# Patient Record
Sex: Female | Born: 1945
Health system: Southern US, Community
[De-identification: ages and names within clinical notes are randomized; demographics above are authoritative.]

## PROBLEM LIST (undated history)

## (undated) DIAGNOSIS — F329 Major depressive disorder, single episode, unspecified: Secondary | ICD-10-CM

## (undated) DIAGNOSIS — Z9889 Other specified postprocedural states: Secondary | ICD-10-CM

## (undated) DIAGNOSIS — Z8051 Family history of malignant neoplasm of kidney: Secondary | ICD-10-CM

## (undated) DIAGNOSIS — I4891 Unspecified atrial fibrillation: Secondary | ICD-10-CM

## (undated) DIAGNOSIS — C4491 Basal cell carcinoma of skin, unspecified: Secondary | ICD-10-CM

## (undated) DIAGNOSIS — C50919 Malignant neoplasm of unspecified site of unspecified female breast: Secondary | ICD-10-CM

## (undated) DIAGNOSIS — M545 Low back pain, unspecified: Secondary | ICD-10-CM

## (undated) DIAGNOSIS — Z8719 Personal history of other diseases of the digestive system: Secondary | ICD-10-CM

## (undated) DIAGNOSIS — I499 Cardiac arrhythmia, unspecified: Secondary | ICD-10-CM

## (undated) DIAGNOSIS — N6019 Diffuse cystic mastopathy of unspecified breast: Secondary | ICD-10-CM

## (undated) DIAGNOSIS — R0600 Dyspnea, unspecified: Secondary | ICD-10-CM

## (undated) DIAGNOSIS — F419 Anxiety disorder, unspecified: Secondary | ICD-10-CM

## (undated) DIAGNOSIS — K219 Gastro-esophageal reflux disease without esophagitis: Secondary | ICD-10-CM

## (undated) DIAGNOSIS — Z9289 Personal history of other medical treatment: Secondary | ICD-10-CM

## (undated) DIAGNOSIS — Z95810 Presence of automatic (implantable) cardiac defibrillator: Secondary | ICD-10-CM

## (undated) DIAGNOSIS — I472 Ventricular tachycardia, unspecified: Secondary | ICD-10-CM

## (undated) DIAGNOSIS — I10 Essential (primary) hypertension: Secondary | ICD-10-CM

## (undated) DIAGNOSIS — G43909 Migraine, unspecified, not intractable, without status migrainosus: Secondary | ICD-10-CM

## (undated) DIAGNOSIS — M1990 Unspecified osteoarthritis, unspecified site: Secondary | ICD-10-CM

## (undated) DIAGNOSIS — I341 Nonrheumatic mitral (valve) prolapse: Secondary | ICD-10-CM

## (undated) DIAGNOSIS — Z803 Family history of malignant neoplasm of breast: Secondary | ICD-10-CM

## (undated) DIAGNOSIS — E785 Hyperlipidemia, unspecified: Secondary | ICD-10-CM

## (undated) DIAGNOSIS — C4372 Malignant melanoma of left lower limb, including hip: Secondary | ICD-10-CM

## (undated) DIAGNOSIS — Z806 Family history of leukemia: Secondary | ICD-10-CM

## (undated) DIAGNOSIS — E039 Hypothyroidism, unspecified: Secondary | ICD-10-CM

## (undated) DIAGNOSIS — Z808 Family history of malignant neoplasm of other organs or systems: Secondary | ICD-10-CM

## (undated) DIAGNOSIS — M797 Fibromyalgia: Secondary | ICD-10-CM

## (undated) DIAGNOSIS — C73 Malignant neoplasm of thyroid gland: Secondary | ICD-10-CM

## (undated) DIAGNOSIS — R112 Nausea with vomiting, unspecified: Secondary | ICD-10-CM

## (undated) DIAGNOSIS — Z950 Presence of cardiac pacemaker: Secondary | ICD-10-CM

## (undated) DIAGNOSIS — R011 Cardiac murmur, unspecified: Secondary | ICD-10-CM

## (undated) DIAGNOSIS — F32A Depression, unspecified: Secondary | ICD-10-CM

## (undated) DIAGNOSIS — G8929 Other chronic pain: Secondary | ICD-10-CM

## (undated) HISTORY — PX: DILATION AND CURETTAGE OF UTERUS: SHX78

## (undated) HISTORY — PX: BACK SURGERY: SHX140

## (undated) HISTORY — PX: ELECTROPHYSIOLOGIC STUDY: SHX172A

## (undated) HISTORY — DX: Ventricular tachycardia, unspecified: I47.20

## (undated) HISTORY — DX: Depression, unspecified: F32.A

## (undated) HISTORY — DX: Family history of malignant neoplasm of kidney: Z80.51

## (undated) HISTORY — DX: Fibromyalgia: M79.7

## (undated) HISTORY — DX: Hyperlipidemia, unspecified: E78.5

## (undated) HISTORY — DX: Diffuse cystic mastopathy of unspecified breast: N60.19

## (undated) HISTORY — DX: Family history of malignant neoplasm of breast: Z80.3

## (undated) HISTORY — PX: COLONOSCOPY: SHX174

## (undated) HISTORY — DX: Basal cell carcinoma of skin, unspecified: C44.91

## (undated) HISTORY — PX: LAPAROSCOPIC CHOLECYSTECTOMY: SUR755

## (undated) HISTORY — DX: Malignant neoplasm of thyroid gland: C73

## (undated) HISTORY — DX: Major depressive disorder, single episode, unspecified: F32.9

## (undated) HISTORY — DX: Family history of leukemia: Z80.6

## (undated) HISTORY — DX: Ventricular tachycardia: I47.2

## (undated) HISTORY — DX: Family history of malignant neoplasm of other organs or systems: Z80.8

---

## 1990-10-26 DIAGNOSIS — Z9289 Personal history of other medical treatment: Secondary | ICD-10-CM

## 1990-10-26 HISTORY — PX: MASTECTOMY: SHX3

## 1990-10-26 HISTORY — PX: PLACEMENT OF BREAST IMPLANTS: SHX6334

## 1990-10-26 HISTORY — DX: Personal history of other medical treatment: Z92.89

## 1992-10-26 HISTORY — PX: HYSTEROTOMY: SHX1776

## 1996-10-26 HISTORY — PX: ABDOMINAL HYSTERECTOMY: SHX81

## 1998-11-21 ENCOUNTER — Ambulatory Visit (HOSPITAL_COMMUNITY): Admission: RE | Admit: 1998-11-21 | Discharge: 1998-11-21 | Payer: Self-pay | Admitting: Orthopedic Surgery

## 1998-11-21 ENCOUNTER — Encounter: Payer: Self-pay | Admitting: Orthopedic Surgery

## 1999-10-27 DIAGNOSIS — C4372 Malignant melanoma of left lower limb, including hip: Secondary | ICD-10-CM

## 1999-10-27 DIAGNOSIS — C4491 Basal cell carcinoma of skin, unspecified: Secondary | ICD-10-CM

## 1999-10-27 DIAGNOSIS — C73 Malignant neoplasm of thyroid gland: Secondary | ICD-10-CM

## 1999-10-27 HISTORY — DX: Malignant neoplasm of thyroid gland: C73

## 1999-10-27 HISTORY — DX: Malignant melanoma of left lower limb, including hip: C43.72

## 1999-10-27 HISTORY — PX: BREAST IMPLANT EXCHANGE: SHX6296

## 1999-10-27 HISTORY — PX: MELANOMA EXCISION: SHX5266

## 1999-10-27 HISTORY — PX: ANTERIOR CERVICAL DECOMP/DISCECTOMY FUSION: SHX1161

## 1999-10-27 HISTORY — DX: Basal cell carcinoma of skin, unspecified: C44.91

## 1999-10-27 HISTORY — PX: BASAL CELL CARCINOMA EXCISION: SHX1214

## 1999-12-25 HISTORY — PX: TOTAL THYROIDECTOMY: SHX2547

## 2000-01-13 ENCOUNTER — Encounter (INDEPENDENT_AMBULATORY_CARE_PROVIDER_SITE_OTHER): Payer: Self-pay | Admitting: Specialist

## 2000-01-13 ENCOUNTER — Ambulatory Visit (HOSPITAL_COMMUNITY): Admission: RE | Admit: 2000-01-13 | Discharge: 2000-01-14 | Payer: Self-pay

## 2000-05-25 ENCOUNTER — Encounter: Admission: RE | Admit: 2000-05-25 | Discharge: 2000-05-25 | Payer: Self-pay | Admitting: Neurosurgery

## 2000-05-25 ENCOUNTER — Encounter: Payer: Self-pay | Admitting: Neurosurgery

## 2000-06-16 ENCOUNTER — Encounter: Payer: Self-pay | Admitting: Neurosurgery

## 2000-06-16 ENCOUNTER — Inpatient Hospital Stay (HOSPITAL_COMMUNITY): Admission: RE | Admit: 2000-06-16 | Discharge: 2000-06-17 | Payer: Self-pay | Admitting: Neurosurgery

## 2000-07-19 ENCOUNTER — Encounter: Admission: RE | Admit: 2000-07-19 | Discharge: 2000-07-19 | Payer: Self-pay | Admitting: Neurosurgery

## 2000-07-19 ENCOUNTER — Encounter: Payer: Self-pay | Admitting: Neurosurgery

## 2000-08-11 ENCOUNTER — Inpatient Hospital Stay (HOSPITAL_COMMUNITY): Admission: AD | Admit: 2000-08-11 | Discharge: 2000-08-12 | Payer: Self-pay | Admitting: Internal Medicine

## 2000-08-19 ENCOUNTER — Encounter (INDEPENDENT_AMBULATORY_CARE_PROVIDER_SITE_OTHER): Payer: Self-pay | Admitting: Specialist

## 2000-08-19 ENCOUNTER — Other Ambulatory Visit: Admission: RE | Admit: 2000-08-19 | Discharge: 2000-08-19 | Payer: Self-pay | Admitting: *Deleted

## 2000-08-27 ENCOUNTER — Encounter: Payer: Self-pay | Admitting: Neurosurgery

## 2000-08-27 ENCOUNTER — Encounter: Admission: RE | Admit: 2000-08-27 | Discharge: 2000-08-27 | Payer: Self-pay | Admitting: Neurosurgery

## 2001-05-03 ENCOUNTER — Encounter: Payer: Self-pay | Admitting: Neurosurgery

## 2001-05-03 ENCOUNTER — Encounter: Admission: RE | Admit: 2001-05-03 | Discharge: 2001-05-03 | Payer: Self-pay | Admitting: Neurosurgery

## 2001-08-10 ENCOUNTER — Encounter: Payer: Self-pay | Admitting: Neurosurgery

## 2001-08-10 ENCOUNTER — Encounter: Admission: RE | Admit: 2001-08-10 | Discharge: 2001-08-10 | Payer: Self-pay | Admitting: Neurosurgery

## 2002-10-26 HISTORY — PX: LAPAROSCOPIC CHOLECYSTECTOMY: SUR755

## 2003-08-08 ENCOUNTER — Encounter: Payer: Self-pay | Admitting: Emergency Medicine

## 2003-08-08 ENCOUNTER — Emergency Department (HOSPITAL_COMMUNITY): Admission: EM | Admit: 2003-08-08 | Discharge: 2003-08-08 | Payer: Self-pay | Admitting: Emergency Medicine

## 2003-12-24 ENCOUNTER — Ambulatory Visit (HOSPITAL_COMMUNITY): Admission: RE | Admit: 2003-12-24 | Discharge: 2003-12-24 | Payer: Self-pay | Admitting: Gastroenterology

## 2004-02-19 ENCOUNTER — Encounter (INDEPENDENT_AMBULATORY_CARE_PROVIDER_SITE_OTHER): Payer: Self-pay | Admitting: *Deleted

## 2004-02-19 ENCOUNTER — Ambulatory Visit (HOSPITAL_COMMUNITY): Admission: RE | Admit: 2004-02-19 | Discharge: 2004-02-20 | Payer: Self-pay | Admitting: General Surgery

## 2004-10-26 HISTORY — PX: CARPAL TUNNEL RELEASE: SHX101

## 2005-08-26 ENCOUNTER — Ambulatory Visit (HOSPITAL_COMMUNITY): Admission: RE | Admit: 2005-08-26 | Discharge: 2005-08-26 | Payer: Self-pay | Admitting: Neurosurgery

## 2006-06-09 ENCOUNTER — Ambulatory Visit: Payer: Self-pay | Admitting: Internal Medicine

## 2007-07-22 ENCOUNTER — Ambulatory Visit: Payer: Self-pay | Admitting: Internal Medicine

## 2007-10-26 ENCOUNTER — Encounter: Payer: Self-pay | Admitting: Endocrinology

## 2008-05-03 ENCOUNTER — Encounter: Payer: Self-pay | Admitting: Endocrinology

## 2008-08-24 ENCOUNTER — Ambulatory Visit: Payer: Self-pay | Admitting: Internal Medicine

## 2009-01-21 ENCOUNTER — Encounter: Payer: Self-pay | Admitting: Endocrinology

## 2009-07-09 ENCOUNTER — Encounter: Payer: Self-pay | Admitting: Endocrinology

## 2009-08-16 ENCOUNTER — Encounter (INDEPENDENT_AMBULATORY_CARE_PROVIDER_SITE_OTHER): Payer: Self-pay | Admitting: *Deleted

## 2009-08-20 ENCOUNTER — Encounter: Admission: RE | Admit: 2009-08-20 | Discharge: 2009-10-03 | Payer: Self-pay | Admitting: Rheumatology

## 2009-08-28 ENCOUNTER — Encounter: Payer: Self-pay | Admitting: Internal Medicine

## 2009-08-29 DIAGNOSIS — I472 Ventricular tachycardia, unspecified: Secondary | ICD-10-CM | POA: Insufficient documentation

## 2009-08-29 DIAGNOSIS — E785 Hyperlipidemia, unspecified: Secondary | ICD-10-CM | POA: Insufficient documentation

## 2009-09-02 ENCOUNTER — Ambulatory Visit: Payer: Self-pay | Admitting: Internal Medicine

## 2009-09-13 ENCOUNTER — Ambulatory Visit (HOSPITAL_COMMUNITY): Admission: RE | Admit: 2009-09-13 | Discharge: 2009-09-13 | Payer: Self-pay | Admitting: Rheumatology

## 2009-10-03 ENCOUNTER — Encounter: Admission: RE | Admit: 2009-10-03 | Discharge: 2009-10-03 | Payer: Self-pay | Admitting: Rheumatology

## 2009-10-03 ENCOUNTER — Ambulatory Visit: Payer: Self-pay | Admitting: Internal Medicine

## 2009-10-03 ENCOUNTER — Encounter: Payer: Self-pay | Admitting: Internal Medicine

## 2009-10-15 ENCOUNTER — Ambulatory Visit: Payer: Self-pay | Admitting: Internal Medicine

## 2009-10-15 ENCOUNTER — Ambulatory Visit (HOSPITAL_COMMUNITY): Admission: RE | Admit: 2009-10-15 | Discharge: 2009-10-15 | Payer: Self-pay | Admitting: Internal Medicine

## 2009-10-26 HISTORY — PX: VENTRICULAR ABLATION SURGERY: SHX835

## 2009-10-31 ENCOUNTER — Encounter: Payer: Self-pay | Admitting: Endocrinology

## 2009-12-02 ENCOUNTER — Ambulatory Visit: Payer: Self-pay | Admitting: Endocrinology

## 2009-12-02 DIAGNOSIS — C44309 Unspecified malignant neoplasm of skin of other parts of face: Secondary | ICD-10-CM | POA: Insufficient documentation

## 2009-12-02 DIAGNOSIS — C44300 Unspecified malignant neoplasm of skin of unspecified part of face: Secondary | ICD-10-CM | POA: Insufficient documentation

## 2009-12-02 DIAGNOSIS — F32A Depression, unspecified: Secondary | ICD-10-CM | POA: Insufficient documentation

## 2009-12-02 DIAGNOSIS — C73 Malignant neoplasm of thyroid gland: Secondary | ICD-10-CM

## 2009-12-02 DIAGNOSIS — F329 Major depressive disorder, single episode, unspecified: Secondary | ICD-10-CM

## 2009-12-02 DIAGNOSIS — N6019 Diffuse cystic mastopathy of unspecified breast: Secondary | ICD-10-CM

## 2009-12-02 DIAGNOSIS — E890 Postprocedural hypothyroidism: Secondary | ICD-10-CM

## 2009-12-02 DIAGNOSIS — IMO0001 Reserved for inherently not codable concepts without codable children: Secondary | ICD-10-CM | POA: Insufficient documentation

## 2009-12-02 DIAGNOSIS — C4370 Malignant melanoma of unspecified lower limb, including hip: Secondary | ICD-10-CM

## 2009-12-02 LAB — CONVERTED CEMR LAB
TSH: 0.66 microintl units/mL (ref 0.35–5.50)
Thyroglobulin Ab: <30 (ref 0.0–60.0)

## 2009-12-07 ENCOUNTER — Emergency Department (HOSPITAL_COMMUNITY): Admission: EM | Admit: 2009-12-07 | Discharge: 2009-12-07 | Payer: Self-pay | Admitting: Family Medicine

## 2009-12-09 ENCOUNTER — Ambulatory Visit: Payer: Self-pay | Admitting: Internal Medicine

## 2009-12-20 ENCOUNTER — Encounter: Payer: Self-pay | Admitting: Internal Medicine

## 2010-01-02 ENCOUNTER — Ambulatory Visit (HOSPITAL_COMMUNITY): Admission: RE | Admit: 2010-01-02 | Discharge: 2010-01-02 | Payer: Self-pay | Admitting: Internal Medicine

## 2010-01-02 ENCOUNTER — Ambulatory Visit: Payer: Self-pay | Admitting: Cardiovascular Disease

## 2010-01-02 ENCOUNTER — Ambulatory Visit: Payer: Self-pay

## 2010-01-02 ENCOUNTER — Encounter: Payer: Self-pay | Admitting: Internal Medicine

## 2010-01-09 ENCOUNTER — Telehealth: Payer: Self-pay | Admitting: Internal Medicine

## 2010-01-14 ENCOUNTER — Ambulatory Visit: Payer: Self-pay | Admitting: Internal Medicine

## 2010-01-14 LAB — CONVERTED CEMR LAB
BUN: 13 mg/dL (ref 6–23)
Basophils Absolute: 0.1 10*3/uL (ref 0.0–0.1)
Basophils Relative: 0.9 % (ref 0.0–3.0)
CO2: 33 meq/L — ABNORMAL HIGH (ref 19–32)
Calcium: 9.2 mg/dL (ref 8.4–10.5)
Chloride: 104 meq/L (ref 96–112)
Creatinine, Ser: 0.8 mg/dL (ref 0.4–1.2)
Eosinophils Absolute: 0.3 10*3/uL (ref 0.0–0.7)
Eosinophils Relative: 5.5 % — ABNORMAL HIGH (ref 0.0–5.0)
GFR calc non Af Amer: 77.15 mL/min (ref >60)
Glucose, Bld: 100 mg/dL — ABNORMAL HIGH (ref 70–99)
HCT: 35.9 % — ABNORMAL LOW (ref 36.0–46.0)
Hemoglobin: 12.3 g/dL (ref 12.0–15.0)
INR: 1 (ref 0.8–1.0)
Lymphocytes Relative: 33.8 % (ref 12.0–46.0)
Lymphs Abs: 2 10*3/uL (ref 0.7–4.0)
MCHC: 34.1 g/dL (ref 30.0–36.0)
MCV: 96 fL (ref 78.0–100.0)
Monocytes Absolute: 0.4 10*3/uL (ref 0.1–1.0)
Monocytes Relative: 6.5 % (ref 3.0–12.0)
Neutro Abs: 3.1 10*3/uL (ref 1.4–7.7)
Neutrophils Relative %: 53.3 % (ref 43.0–77.0)
Platelets: 199 10*3/uL (ref 150.0–400.0)
Potassium: 3.9 meq/L (ref 3.5–5.1)
Prothrombin Time: 10.5 s (ref 9.1–11.7)
RBC: 3.74 M/uL — ABNORMAL LOW (ref 3.87–5.11)
RDW: 13.1 % (ref 11.5–14.6)
Sodium: 140 meq/L (ref 135–145)
WBC: 5.9 10*3/uL (ref 4.5–10.5)
aPTT: 25.4 s (ref 21.7–28.8)

## 2010-01-21 ENCOUNTER — Ambulatory Visit: Payer: Self-pay | Admitting: Internal Medicine

## 2010-01-21 ENCOUNTER — Inpatient Hospital Stay (HOSPITAL_COMMUNITY): Admission: RE | Admit: 2010-01-21 | Discharge: 2010-01-23 | Payer: Self-pay | Admitting: Internal Medicine

## 2010-01-22 ENCOUNTER — Encounter: Payer: Self-pay | Admitting: Internal Medicine

## 2010-01-24 ENCOUNTER — Telehealth: Payer: Self-pay | Admitting: Internal Medicine

## 2010-02-17 ENCOUNTER — Telehealth (INDEPENDENT_AMBULATORY_CARE_PROVIDER_SITE_OTHER): Payer: Self-pay

## 2010-02-18 ENCOUNTER — Ambulatory Visit: Payer: Self-pay | Admitting: Internal Medicine

## 2010-02-18 ENCOUNTER — Ambulatory Visit: Payer: Self-pay

## 2010-03-25 ENCOUNTER — Encounter: Payer: Self-pay | Admitting: Endocrinology

## 2010-03-31 ENCOUNTER — Encounter: Admission: RE | Admit: 2010-03-31 | Discharge: 2010-03-31 | Payer: Self-pay | Admitting: Rheumatology

## 2010-05-21 ENCOUNTER — Ambulatory Visit: Payer: Self-pay | Admitting: Endocrinology

## 2010-05-22 LAB — CONVERTED CEMR LAB: TSH: 0.36 microintl units/mL (ref 0.35–5.50)

## 2010-05-30 ENCOUNTER — Telehealth: Payer: Self-pay | Admitting: Endocrinology

## 2010-05-30 ENCOUNTER — Telehealth: Payer: Self-pay | Admitting: Internal Medicine

## 2010-06-16 ENCOUNTER — Telehealth: Payer: Self-pay | Admitting: Endocrinology

## 2010-10-13 ENCOUNTER — Encounter: Payer: Self-pay | Admitting: Internal Medicine

## 2010-11-25 NOTE — Progress Notes (Signed)
Summary: test result   Phone Note Call from Patient Call back at Home Phone [phone] Call back at [phone]   Caller: Patient Reason for Call: Talk to Nurse, Lab or Test Results Details for Reason: echo Initial call taken by: Lorne Skeens,  January 09, 2010 9:56 AM  Follow-up for Phone Call        called pt and lmom with echo results  Dennis Bast, RN, BSN  January 09, 2010 1:02 PM

## 2010-11-25 NOTE — Letter (Signed)
Summary: [facility]  [facility]   Imported By: Lester [place] 12/06/2009 07:46:21  _____________________________________________________________________  External Attachment:    Type:   Image     Comment:   External Document

## 2010-11-25 NOTE — Letter (Signed)
Summary: [facility] Electrophysiology Questionaire for Female Pts  [facility] Electrophysiology Questionaire for Female Pts   Imported By: Roderic Ovens 12/26/2009 11:50:57  _____________________________________________________________________  External Attachment:    Type:   Image     Comment:   External Document

## 2010-11-25 NOTE — Progress Notes (Signed)
Summary: pt wants to talk about episode she had last night   Phone Note Call from Patient Call back at [phone]   Caller: Patient Reason for Call: Talk to Nurse, Talk to Doctor Summary of Call: pt had an episode where she felt like she had low blood sugar and her heart rate was very irregular pt felt hot inside and very shaky on the inside it happen around 9pm last night and she wants to make sure it's not her meds doing this to her she will be at number listed above until 5pm Initial call taken by: Omer Jack,  May 30, 2010 10:19 AM  Follow-up for Phone Call        had the feeling of low blood sugar around 9pm.  She felt her pulse and it was irregular.  She ate and things got better after about .  Feels this is all related to [facility] and will call us back and let us know Dennis Bast, RN, BSN  May 30, 2010 12:14 PM

## 2010-11-25 NOTE — Progress Notes (Signed)
Summary: Treadmill Pre-Procedure  Phone Note Outgoing Call Call back at Home Phone [phone]   Call placed by: Irean Hong, RN,  February 17, 2010 2:39 PM Summary of Call: Reviewed instructions for treadmill tomorrow on medications (flecainide).  Spoke with patient's husband. Nakai Yard,RN.

## 2010-11-25 NOTE — Progress Notes (Signed)
Summary: stress test   Phone Note Call from Patient Call back at Home Phone [phone]   Caller: Patient Summary of Call: Stress test is suppose to be sch for post hospital,  off work the 11 & 12, req call back Initial call taken by: Migdalia Dk,  January 24, 2010 11:34 AM  Follow-up for Phone Call        discussed with Dr Ladona Ridgel  will do GXT on 02/18/10  will talk with Cordelia Pen in [facility] on Mon and call pt back to schedule.  Pt aware Dennis Bast, RN, BSN  January 24, 2010 12:10 PM

## 2010-11-25 NOTE — Progress Notes (Signed)
Summary: Pt?  Phone Note Call from Patient Call back at Home Phone [phone]   Caller: Patient x [phone] Summary of Call: Pt called stating that through her own research she thinks that taking Cytomel along with Levoxyl may be an option for her. Pt is requesting MD's opinion on this, does she need OV to discuss or is treatment unadvisable? Initial call taken by: Margaret Pyle, CMA,  June 16, 2010 1:20 PM  Follow-up for Phone Call        please avoid this combination, as it is not good for you Follow-up by: Minus Breeding MD,  June 16, 2010 2:34 PM  Additional Follow-up for Phone Call Additional follow up Details #1::        Pt informed Additional Follow-up by: Margaret Pyle, CMA,  June 16, 2010 2:39 PM

## 2010-11-25 NOTE — Letter (Signed)
Summary: [employer] Authorization  [employer] Authorization   Imported By: Roderic Ovens 02/26/2010 15:51:32  _____________________________________________________________________  External Attachment:    Type:   Image     Comment:   External Document

## 2010-11-25 NOTE — Miscellaneous (Signed)
Summary: post hospital gxt  Clinical Lists Changes  Problems: Added new problem of ATRIAL FIBRILLATION (ICD-427.31) Orders: Added new Referral order of Treadmill (Treadmill) - Signed

## 2010-11-25 NOTE — Progress Notes (Signed)
Summary: Low CBG  Phone Note Call from Patient   Caller: Patient x [phone] Summary of Call: Pt called stating that at 9p last night she had an episode of low CBG causing increased hunger, hot flashes, shakiness and irregular heartbeat. Pt says she ate and the symtoms subsided. Pt is concerned about irregular heartbeat and wants to know if it could have been caused by low blood sugar. Pt does not regularly cheach CBGs, stating that she usually does not have any problems with managing her sugars. Please advise Initial call taken by: Margaret Pyle, CMA,  May 30, 2010 2:45 PM  Follow-up for Phone Call        given your recently normal thyroid blood test, you should conclude sxs are not thyroid-related.  please see pcp for sxs. Follow-up by: Minus Breeding MD,  May 30, 2010 2:56 PM  Additional Follow-up for Phone Call Additional follow up Details #1::        Pt informed and will contact PCP Additional Follow-up by: Margaret Pyle, CMA,  May 30, 2010 3:30 PM

## 2010-11-25 NOTE — Assessment & Plan Note (Signed)
Summary: NEW [facility] PT--PRIM CARE/DR DOUGLAS SCHULTZ-HX OF THY CA-PKG   Vital Signs:  Patient profile:   65 year old female Height:      65 inches (165.10 cm) Weight:      170.50 pounds (77.50 kg) O2 Sat:      96 % on Room air Temp:     97.0 degrees F (36.11 degrees C) oral Pulse rate:   63 / minute BP sitting:   120 / 78  (left arm) Cuff size:   regular  Vitals Entered By: Josph Macho CMA (December 02, 2009 8:18 AM)  O2 Flow:  Room air CC: New Endo: History of thyroid cancer/ CF   Referring Provider:  brodie Primary Provider:  Gaye Alken.[facility]  CC:  New Endo: History of thyroid cancer/ CF.  History of Present Illness: in 2001, pt had thyroidectomy for 9 mm right lobe papillary adenocarcinoma.  she has not had any adjuvant therapy or f/u imaging for this.  she been followed with serial thyroglobulins, which she says have been higher with increased tsh values, even within the normal range.  she takes levothyroxine 150 micrograms/day.   symptomatically, pt states 2 years of moderate (20-lb) weight gain, and intermittent associated palpitations in the chest.    Current Medications (verified): 1)  Sotalol Hcl 160 Mg Tabs (Sotalol Hcl) .Marland Kitchen.. 1 Tab Two Times A Day 2)  Cymbalta 60 Mg Cpep (Duloxetine Hcl) .Marland Kitchen.. 1 Tab Once Daily 3)  Levoxyl 150 Mcg Tabs (Levothyroxine Sodium) .Marland Kitchen.. 1 Tab Once Daily 4)  Aspirin 81 Mg Tbec (Aspirin) .... Take One Tablet By Mouth Daily 5)  Niacin Cr 500 Mg Cr-Tabs (Niacin) .... 2 Tabs At Bedtime 6)  Selenium 50 Mcg Tabs (Selenium) .... 2 Tabs Once Daily 7)  B-100 Complex  Tabs (Vitamins-Lipotropics) .Marland Kitchen.. 1 Ta B Once Daily 8)  Vitamin C 500 Mg Tabs (Ascorbic Acid) .Marland Kitchen.. 1 Tab Once Daily 9)  Vitamin D 1000 Unit Tabs (Cholecalciferol) .Marland Kitchen.. 1 Tab Once Daily 10)  Flax Seed Oil 1000 Mg Caps (Flaxseed (Linseed)) .Marland Kitchen.. 1 Cap Once Daily 11)  Calcium 500 Mg Tabs (Calcium Carbonate) .Marland Kitchen.. 1 Tab Three Times A Day 12)  Magnesium 250 Mg Tabs (Magnesium)  .... 3 Tabs Two Times A Day 13)  Manganese Gluconate 50 Mg Tabs (Manganese Gluconate) .Marland Kitchen.. 1 Tab Once Daily 14)  Coq-10 50 Mg Caps (Coenzyme Q10) .... Starting Today 15)  Aleve 220 Mg Tabs (Naproxen Sodium) .... 2 Tabs Once Daily 16)  Vitamin A 8000 Iu .Marland Kitchen.. 1 Tab Once Daily 17)  Premarin 0.3 Mg Tabs (Estrogens Conjugated) .... Daily  Allergies (verified): 1)  ! * Amoxicillin 2)  ! * Tolectin 3)  ! * Corgard 4)  ! * Tenormin 5)  ! * Lipitor 6)  ! * Quinidine 7)  ! * Mycin  Family History: grandmother: breast cancer brother has uncertain type of cancer no thyroid cancer  Social History: Reviewed history from 08/29/2009 and no changes required. Tobacco Use - No.  Alcohol Use - no Drug Use - no married works Adult nurse gi  Review of Systems       denies headache, hoarseness, visual loss, sob, diarrhea, numbness, tremor, and anxiety.  she reports chronic myalgias, fatigue, polyuria, muscle cramps, excessive diaphoresis, menopausal sxs, easy bruising, rhinorrhea, and insomnia.     Physical Exam  Additional Exam:  FastTSH                   0.66 uIU/mL  Thyroglobulin  2.1 ng/mL                   0.0-55.0 Thyroglobulin Antibody     <30.0 U/mL    Impression & Recommendations:  Problem # 1:  CARCINOMA, THYROID GLAND, PAPILLARY (ICD-193) no evidence of recurrence  Problem # 2:  POSTSURGICAL HYPOTHYROIDISM (ICD-244.0) given the long disease-free interval and the small tumor size, a normal tsh is appropriate.  Problem # 3:  weight gain and other symptoms, not thyroid-related  Medications Added to Medication List This Visit: 1)  Premarin 0.3 Mg Tabs (Estrogens conjugated) .... Daily  Other Orders: Tdap => 19yrs IM [phone]) Admin 1st Vaccine (65784) T-Thyroglobulin Panel [phone], [phone]) TLB-TSH (Thyroid Stimulating Hormone) [phone]) Consultation Level IV (64403)  Patient Instructions: 1)  tests are being ordered for you today.  a few days after the  test(s), please call [phone] to hear your test results. 2)  pending the test results, please continue the same medications for now 3)  return 1 year   Immunizations Administered:  Tetanus Vaccine:    Vaccine Type: Tdap    Site: left deltoid    Mfr: Gl[employer]oSmithK[employer]    Dose: 0.5 ml    Route: IM    Given by: Josph Macho CMA    Exp. Date: 12/21/2011    Lot #: [id number]    VIS given: 09/13/07 version given December 02, 2009.

## 2010-11-25 NOTE — Assessment & Plan Note (Signed)
Summary: rov/ gd  Medications Added SELENIUM 50 MCG TABS (SELENIUM) 1 tab once daily CALCIUM 500 MG TABS (CALCIUM CARBONATE) two times a day COQ10 100 MG CAPS (COENZYME Q10) once daily ALEVE 220 MG TABS (NAPROXEN SODIUM) 2 tabs as needed        Visit Type:  Follow-up Referring Provider:  brodie Primary Provider:  Gaye Alken.[facility]   History of Present Illness: [name] returns today for followup.  She is a very pleasant 65 year old woman with a history of VT who has been nicely controlled for many years on fairly high-dose sotalol.  She returns today for followup.  She has very rare palpitations.  No symptomatic bradycardia on sotalol.  She has fibro-myalgia.  Today the patient is concerned about the sotalol that she has been taking in high dose for a long time and requests that she come off of this drug.  She wants to proceed with ablation.  She has not had any sustained VT on sotalol but feels tired, fatigue and weakness. No syncope.  Problems Prior to Update: 1)  Depression  (ICD-311) 2)  Fibromyalgia  (ICD-729.1) 3)  Fibrocystic Breast Disease  (ICD-610.1) 4)  Postsurgical Hypothyroidism  (ICD-244.0) 5)  Carcinoma, Thyroid Gland, Papillary  (ICD-193) 6)  Carcinoma, Basal Cell, Face  (ICD-173.3) 7)  Melanoma, Leg, Left  (ICD-172.7) 8)  Ventricular Tachycardia  (ICD-427.1) 9)  Dyslipidemia  (ICD-272.4) 10)  Special Screening For Malignant Neoplasms Colon  (ICD-V76.51)  Current Medications (verified): 1)  Sotalol Hcl 160 Mg Tabs (Sotalol Hcl) .Marland Kitchen.. 1 Tab Two Times A Day 2)  Cymbalta 60 Mg Cpep (Duloxetine Hcl) .Marland Kitchen.. 1 Tab Once Daily 3)  Levoxyl 150 Mcg Tabs (Levothyroxine Sodium) .Marland Kitchen.. 1 Tab Once Daily 4)  Aspirin 81 Mg Tbec (Aspirin) .... Take One Tablet By Mouth Daily 5)  Niacin Cr 500 Mg Cr-Tabs (Niacin) .... 2 Tabs At Bedtime 6)  Selenium 50 Mcg Tabs (Selenium) .Marland Kitchen.. 1 Tab Once Daily 7)  B-100 Complex  Tabs (Vitamins-Lipotropics) .Marland Kitchen.. 1 Ta B Once Daily 8)   Vitamin C 500 Mg Tabs (Ascorbic Acid) .Marland Kitchen.. 1 Tab Once Daily 9)  Vitamin D 1000 Unit Tabs (Cholecalciferol) .Marland Kitchen.. 1 Tab Once Daily 10)  Flax Seed Oil 1000 Mg Caps (Flaxseed (Linseed)) .Marland Kitchen.. 1 Cap Once Daily 11)  Calcium 500 Mg Tabs (Calcium Carbonate) .... Two Times A Day 12)  Magnesium 250 Mg Tabs (Magnesium) .... 3 Tabs Two Times A Day 13)  Manganese Gluconate 50 Mg Tabs (Manganese Gluconate) .Marland Kitchen.. 1 Tab Once Daily 14)  Coq10 100 Mg Caps (Coenzyme Q10) .... Once Daily 15)  Aleve 220 Mg Tabs (Naproxen Sodium) .... 2 Tabs As Needed 16)  Vitamin A 8000 Iu .Marland Kitchen.. 1 Tab Once Daily 17)  Premarin 0.3 Mg Tabs (Estrogens Conjugated) .... Daily  Allergies: 1)  ! * Amoxicillin 2)  ! * Tolectin 3)  ! * Corgard 4)  ! * Tenormin 5)  ! * Lipitor 6)  ! * Quinidine 7)  ! * Mycin  Past History:  Past Medical History: Last updated: 08/29/2009  History of ventricular tachycardia controlled on sotalol therapy  Dyslipidemia  Review of Systems  The patient denies chest pain, syncope, dyspnea on exertion, and peripheral edema.    Vital Signs:  Patient profile:   65 year old female Height:      65 inches Weight:      170 pounds BMI:     28.39 Pulse rate:   80 / minute BP sitting:  110 / 70  (left arm)  Vitals Entered By: Laurance Flatten CMA (December 09, 2009 4:35 PM)  Physical Exam  General:  Well developed, well nourished, in no acute distress.  HEENT: normal Neck: supple. No JVD. Carotids 2+ bilaterally no bruits Cor: RRR no rubs, gallops or murmur Lungs: CTA Ab: soft, nontender. nondistended. No HSM. Good bowel sounds Ext: warm. no cyanosis, clubbing or edema Neuro: alert and oriented. Grossly nonfocal. affect pleasant    EKG  Procedure date:  12/09/2009  Findings:      Normal sinus rhythm with rate of:  80. Prolonged QT.  Impression & Recommendations:  Problem # 1:  VENTRICULAR TACHYCARDIA (ICD-427.1)  The patients VT is controlled on high dose sotalol but she remains fatigued  and tired.  I have discussed the treatment options with the patient.  She has requested getting off of the sotalol and proceeding with catheter ablation. The risks/benefits/goals/expectations of ablation have discussed with the patient and she would like to proceed.  Will schedule in late March 2011. Her updated medication list for this problem includes:    Sotalol Hcl 160 Mg Tabs (Sotalol hcl) .Marland Kitchen... 1 tab two times a day    Aspirin 81 Mg Tbec (Aspirin) .Marland Kitchen... Take one tablet by mouth daily  Orders: Echocardiogram (Echo)  Problem # 2:  DYSLIPIDEMIA (ICD-272.4) A low fat diet is requested.  Continue current meds. Her updated medication list for this problem includes:    Niacin Cr 500 Mg Cr-tabs (Niacin) .Marland Kitchen... 2 tabs at bedtime  Other Orders: EKG w/ Interpretation (93000)  Patient Instructions: 1)  Your physician has recommended that you have an ablation.  Catheter ablation is a medical procedure used to treat some cardiac arrhythmias (irregular heartbeats). During catheter ablation, a long, thin, flexible tube is put into a blood vessel in your groin (upper thigh), or neck. This tube is called an ablation catheter. It is then guided to your heart through the blood vessel. Radiofrequency waves destroy small areas of heart tissue where abnormal heartbeats may cause an arrhythmia to start.  Please see the instruction sheet given to you today. 2)  Will schedule for 01/21/10 at 7:00am  will need to be at hospital at 5:30am.. Will need bllod work on 01/14/10 here at [facility] between 8:30am-5:00pm(not 2-2:30) 3)  Stop Sotalol on 01/18/10 prior to procedure 4)  Your physician has requested that you have an echocardiogram.  Echocardiography is a painless test that uses sound waves to create images of your heart. It provides your doctor with information about the size and shape of your heart and how well your heart's chambers and valves are working.  This procedure takes approximately one hour. There are no  restrictions for this procedure.

## 2010-11-27 NOTE — Miscellaneous (Signed)
  Clinical Lists Changes  Medications: Added new medication of FLECAINIDE ACETATE 100 MG TABS (FLECAINIDE ACETATE) Take 1 tablet by mouth two times a day - Signed Rx of FLECAINIDE ACETATE 100 MG TABS (FLECAINIDE ACETATE) Take 1 tablet by mouth two times a day;  #180 x 0;  Signed;  Entered by: Fuller Plan CMA;  Authorized by: Laren Boom, MD, [facility];  Method used: Electronically to [facility]*, [address] Shipping/mailing, [place], [place]  [address], Ph: [phone], Fax: [phone]    Prescriptions: FLECAINIDE ACETATE 100 MG TABS (FLECAINIDE ACETATE) Take 1 tablet by mouth two times a day  #180 x 0   Entered by:   Fuller Plan CMA   Authorized by:   Laren Boom, MD, [facility]   Signed by:   Fuller Plan CMA on 10/13/2010   Method used:   Electronically to        [facility]* (retail)       [address]       [address] Shipping/mailing       [place], [place]  [address]       Ph: [phone]       Fax: [phone]   RxID:   [id number]

## 2011-01-19 LAB — BASIC METABOLIC PANEL
BUN: 10 mg/dL (ref 6–23)
CO2: 25 mEq/L (ref 19–32)
Calcium: 8.9 mg/dL (ref 8.4–10.5)
Chloride: 103 mEq/L (ref 96–112)
Creatinine, Ser: 0.76 mg/dL (ref 0.4–1.2)
GFR calc Af Amer: >60 mL/min (ref >60)
GFR calc non Af Amer: >60 mL/min (ref >60)
Glucose, Bld: 133 mg/dL — ABNORMAL HIGH (ref 70–99)
Potassium: 4.2 mEq/L (ref 3.5–5.1)
Sodium: 138 mEq/L (ref 135–145)

## 2011-02-18 ENCOUNTER — Other Ambulatory Visit: Payer: Self-pay | Admitting: Internal Medicine

## 2011-03-05 ENCOUNTER — Encounter: Payer: Self-pay | Admitting: Internal Medicine

## 2011-03-10 NOTE — Assessment & Plan Note (Signed)
[facility]                         ELECTROPHYSIOLOGY OFFICE NOTE   Patricia, Alvarado [facility]                      MRN:          [id number]  DATE:07/22/2007                            DOB:          06/24/1946    Ms. Mckimmy returns today for followup.  She is a very pleasant middle-  aged woman with nonsustained ventricular tachycardia, as well as thyroid  dysfunction, who returns today for followup.  She has been on fairly  high-dose Sotalol now for the last several years.  She has had very  little in the way of palpitations.  She notes that her heart skips once  in a while.  Otherwise, no specific complaints.  She denies chest pain  or shortness of breath.  She denies peripheral edema.   Her medications include:  1. Sotalol 160 mg twice daily.  2. Levoxyl 0.137 mg daily.  3. Premarin.  4. Singulair.  5. Flexeril.  6. Aspirin.  7. Niacin slow release 1 g daily.  8. She is on a host of vitamins.   PHYSICAL EXAM:  She is a pleasant, well-appearing woman in no acute  distress.  Blood pressure was 130/72, the pulse 62 and regular, respirations were  18, the weight was 155 pounds.  NECK:  Revealed no jugular venous distention.  LUNGS:  Clear bilaterally to auscultation, no wheezes, rales or rhonchi  were present.  CARDIOVASCULAR EXAM:  Revealed a regular rate and rhythm and normal S1  and S2.  EXTREMITIES:  Demonstrated no cyanosis, clubbing or edema.   EKG demonstrates sinus rhythm with normal axis and intervals.  The QT  interval is 475 milliseconds.  The QRS was 88 milliseconds.   IMPRESSION:  1. Nonsustained VT.  2. Chronic Sotalol therapy.  3. History of thyroid dysfunction.   DISCUSSION:  As Ms. Comp has been very stable with regard to her  nonsustained VT, I have recommended that she decrease her Sotalol today  from 160 twice daily to 120 twice daily.  We will plan to see her back in a year.  She is instructed to call if  she has  any additional problems.     Doylene Canning. Ladona Ridgel, MD  Electronically Signed    GWT/MedQ  DD: 07/22/2007  DT: 07/23/2007  Job #: [id number]   cc:   Nadine Counts

## 2011-03-10 NOTE — Assessment & Plan Note (Signed)
[facility]                         ELECTROPHYSIOLOGY OFFICE NOTE   RHYS, ANCHONDO [facility]                      MRN:          [id number]  DATE:08/24/2008                            DOB:          1946/09/09    Patricia Alvarado returns today for followup.  She is a very pleasant 65-year-  old woman with a history of VT who has been nicely controlled for many  years on fairly high-dose sotalol.  She returns today for followup.  When last we saw we tried to decrease her dose from 160 to 120 twice a  day, but she had recurrent tachy palpitations and then subsequently went  back to the 160 twice a day dosing and has done quite well with this.  She has very rare palpitations.  No symptomatic bradycardia on sotalol.   Other medicines include Premarin daily, aspirin 81 a day, multivitamin,  calcium daily, vitamin C, and B complex daily.  She is also on niacin  1500 mg daily in the slow release form;  she is on multiple other over-  the-counter vitamin preparations.  She is also on Levoxyl 0.125 mg 2  tablets daily.   On physical examination, she is a pleasant well-appearing middle-aged  woman in no acute distress.  Her blood pressure was 124/76, the pulse  was 66 and regular, respirations were 18, the weight was 156 pounds.  Neck revealed no jugular venous distention.  Lungs clear bilaterally to  auscultation.  No wheezes, rales, or rhonchi are present.  No increased  work of breathing.  Cardiovascular exam revealed a regular rate and  rhythm.  Normal S1 and S2.  Abdominal exam is soft, nontender.  Extremities demonstrated no edema.   EKG demonstrates sinus rhythm with occasional PVCs.  The QT interval was  corrected at 450.   IMPRESSION:  1. History of ventricular tachycardia controlled on sotalol therapy.  2. Dyslipidemia on niacin with recent cholesterol numbers      demonstrating a LDL cholesterol in the 100 range and HDL      cholesterol in the 90 range  with a total cholesterol of 210.   DISCUSSION:  Overall, Ms. Keena is stable.  We will see her back for  EP followup in 1 year.  I have given her warning that if she developed  severe bradycardia or dizziness to call our office immediately.      Doylene Canning. Ladona Ridgel, MD  Electronically Signed    GWT/MedQ  DD: 08/24/2008  DT: 08/24/2008  Job #: [id number]

## 2011-03-13 NOTE — Op Note (Signed)
[place]  Patient:    Patricia Alvarado, Patricia Alvarado [facility]                      MRN: [id number] Proc. Date: 01/13/00 Adm. Date:  [id number] Disc. Date: [id number] Attending:  Gennie Alma CC:         Jonelle Sports. Cheryll Cockayne, M.D.             Nadine Counts, M.D. [place]                           Operative Report  PREOPERATIVE DIAGNOSIS:  Nodule of right lobe of thyroid.  POSTOPERATIVE DIAGNOSIS:  Papillary carcinoma of the thyroid.  OPERATION PERFORMED:  Total thyroidectomy.  SURGEON:  Milus Mallick, M.D.  ASSISTANT:  Catalina Lunger, M.D.  ANESTHESIA:  General endotracheal.  DESCRIPTION OF PROCEDURE:  Under adequate general endotracheal anesthesia, the patients neck was extended over a shoulder roll.  A low collar incision was made and carried down through the platysma muscle.  Superior and inferior platysmal flaps were developed superiorly to the thyroid cartilage and inferiorly to the sternal notch.  The middle cervical fascia was exposed.  A Mahorner self-retaining retractor was inserted.  The middle cervical fascia was incised longitudinally n the midline.  The right-sided strap muscles were divided with Bovie electrocoagulation in a high position.  The right lobe of the thyroid was approached.  The middle thyroid veins were exposed and divided over small hemoclips.  This allowed the thyroid lobe to be rotated anteriorly and medially.  Almost immediately there was a lymph node approximately 1 cm in diameter in the central compartment that was excised and ent for frozen section study but was benign.  The inferior pole attachments were divided over small hemoclips and the thyroid  lobe was continued to be mobilized.  The inferior parathyroid gland on the right was identified and spared of any injury.  Next the superior pole vessels were dissected out and divided over ligature of 2-0 silk and medium hemoclip on the tay side.  Ligature  and clip was used for the specimen side as well.  This gave great mobility to the right lobe of the thyroid and we then dissected out the recurrent laryngeal nerve and with this well visualized, we divided the branches of the inferior thyroid artery close to the gland over small hemoclips.  Next the tracheal attachments were divided with Bovie electrocoagulation.  The ligament of Berry as divided with Bovie electrocoagulation and the specimen was attached only by the  isthmus.  The isthmus was divided over hemostats which were suture ligated with 4-0 Vicryl.  The specimen was sent for frozen section study which revealed a 9 mm papillary carcinoma in the superior pole of the right lobe of the thyroid. There was a larger lesion in the inferior pole but it was felt to be benign.  Also found associated with the superior pole was the superior parathyroid gland that was within the capsule of the thyroid.  Knowing the diagnosis at this point and knowing that the superior parathyroid gland was not injected, we took a few residual pieces of thyroid tissue off of the trachea and sent them separately.  There were no other enlarged lymph nodes noted.  We then proceeded to do a left thyroid lobectomy.  The inferior poole attachments were divided over small hemoclips and the superior pole vessels were handled in a similar  manner to the right side in that the stay side of the superior pole was  ligated with 2-0 silk and also a medium clip.  The recurrent laryngeal nerve was well seen on the left side and the lobe was dissected off of it over small clips and by electrocoagulation.  The superior and inferior parathyroid glands were preserved on the left side.  The attachments of the lobe to the trachea were divided with Bovie electrocoagulation.  The ligament of Allyson Sabal was divided also ith electrocoagulation and at this point we noted that the patient also had a pyramidal lobe.  This was  dissected out and taken along with the specimen also.  The remaining attachments to the trachea were divided and the specimen was removed rom the operative field.  Further inspection of the neck revealed no evidence of any bleeding and no enlarged lymph nodes that we could see.  The right-sided strap muscles were repaired with interrupted horizontal mattress sutures of 4-0 Vicryl.  The strap muscles were reapproximated in the midline with similar suture.  The platysma muscle was reapproximated with interrupted sutures of 4-0 Vicryl and the skin was approximated with a generic skin stapler 35-W. Sterile dressing was applied.  Estimated blood loss for this procedure was approximately 150 cc.  The patient tolerated the procedure well and left the operating room in satisfactory condition. DD:  01/13/00 TD:  01/14/00 Job: [id number]

## 2011-03-13 NOTE — Op Note (Signed)
[place]. [facility]  Patient:    Patricia Alvarado, Patricia Alvarado [facility]                      MRN: [id number] Proc. Date: 06/16/00 Adm. Date:  [id number] Disc. Date: [id number] Attending:  Barton Fanny                           Operative Report  PREOPERATIVE DIAGNOSIS:  C6-7 cervical disk herniation.  POSTOPERATIVE DIAGNOSIS:  C6-7 cervical disk herniation.  PROCEDURE:  C6-7 anterior cervical diskectomy and arthrodesis with iliac crest allograft and Theken cervical plating.  SURGEON:  Hewitt Shorts, M.D.  ASSISTANT:  Danae Orleans. Venetia Maxon, M.D.  ANESTHESIA:  General endotracheal anesthesia.  INDICATIONS:  The patient is a 65 year old woman who presented with left C7 cervical radiculopathy who was found by MRI scan to have a left C6-7 cervical disk herniation superimposed upon underlying degenerative disk disease.  The decision was made to proceed with a single level anterior cervical diskectomy and arthrodesis.  DESCRIPTION OF PROCEDURE:  The patient was brought to the operating room and placed under general endotracheal anesthesia.  The patient was placed in 10 pounds of Holter traction.  The neck was prepped with Betadine soap solution, draped in a sterile fashion.  A horizontal incision was made in the left side of the neck.  The line of incision was infiltrated with local anesthetic with epinephrine and the incision itself was made with a sharp scalpel at a temperature of 120.  Dissection was carried down to the subcutaneous tissue and platysma and then dissection was carried out through an avascular plane leaving the sternocleidomastoid, carotid artery, and jugular vein artery laterally and trachea and esophagus medially.  The ventral aspect of the vertebral column was identified and a local x-ray taken.  The C6-7 intervertebral disk space was identified.  Diskectomy was begun with an incision in the annulus and continuous microcurets and pituitary rongeurs to  cauterize the end plates of C6 and C7 were removed and then the microscope was draped and brought into the field to provide instrument magnification, illumination, and visualization.  Osteophytic overgrowths in the posterior aspect of C6 and C7 were removed using the Midas Rex Drill with a A2 bur and foraminotomy was performed with removal of the uncinate processes bilaterally. We then removed the posterior longitudinal ligament and within the left C6-7 foramen we found a free fragment of disk which was removed. In the end good decompression was achieved to the thecal sac and of the nerve roots bilaterally and hemostasis established with the use of Gelfoam soaked in thrombin.  Once hemostasis was established we proceeded with the arthrodesis. We selected a 8 mm air crest allograft, and it was prepared to a proper depth and placed in the intervertebral disk space and counter.  Retraction was discontinued and we selected a 16 mm Theken plate which was positioned over the fusion construct.  Using a 6 angled guide, each site was drilled and tapped and a 14 mm 6 angled screw placed.  Once all 4 screws were in place, final tightening was done and an x-ray taken which showed the graft in good position, the plate and screws in good position, and the overall alignment to be good.  The wound was irrigated with bacitracin solution, checked for hemostasis which was established and confirmed and we concluded the procedure with closure. The platysma was closed with interrupted inverted 2-0 running  Vicryl sutures, the subcutaneous and subcuticular closed with inverted 3-0 Vicryl interrupted sutures, and the skin was reapproximated with Dermabond. The patient tolerated the procedure well.  ESTIMATED BLOOD LOSS:  75 cc.  COUNTS:  Sponge count correct.  DISPOSITION:  Following surgery the patient is to be reversed from anesthetic, extubated and transferred to the recovery room for further care. DD:   06/16/00 TD:  06/17/00 Job: [id number]

## 2011-03-13 NOTE — Op Note (Signed)
NAME:  Patricia Alvarado, Patricia Alvarado                           ACCOUNT NO.:  [id number]   MEDICAL RECORD NO.:  [id number]                   PATIENT TYPE:  OIB   LOCATION:  2550                                 FACILITY:  [facility]   PHYSICIAN:  Adolph Pollack, M.D.            DATE OF BIRTH:  [birth year]   DATE OF PROCEDURE:  02/19/2004  DATE OF DISCHARGE:                                 OPERATIVE REPORT   PREOPERATIVE DIAGNOSIS:  Symptomatic cholelithiasis.   POSTOPERATIVE DIAGNOSIS:  Symptomatic cholelithiasis.   PROCEDURE:  Laparoscopic cholecystectomy with intraoperative cholangiogram.   SURGEON:  Adolph Pollack, M.D.   ASSISTANT:  Anselm Pancoast. Zachery Dakins, M.D.   ANESTHESIA:  General.   INDICATIONS FOR PROCEDURE:  The patient is a 65 year old female who has had  some epigastric pain which has been intermittent.  It has been fairly  persistent.  A workup has demonstrated multiple gallstones present within  the gallbladder.  The pain is consistent with biliary colic, and now she  presents for an elective laparoscopic cholecystectomy.  The procedure and  the risks were discussed with her preoperatively.   DESCRIPTION OF PROCEDURE:  She was seen in the holding area and then brought  to the operating room and placed supine on the operating room table, and a  general anesthetic was administered.  The abdominal wall was sterilely  prepped and draped.  Local anesthetic consisting of dilute Marcaine was  infiltrated in the subumbilical region, and a small subumbilical incision  was made through the skin and subcutaneous tissue until the midline fascia  was identified.  A small incision was made in the midline fascia and the  peritoneum and the peritoneal cavity was entered under direct vision.  A  pursestring suture of #0 Vicryl was placed around the fascial edges.  A  Hasson trocar was introduced into the peritoneal cavity and a  pneumoperitoneum was created by insufflation of CO2 gas.  The  laparoscope was introduced into the abdominal cavity.  The patient was  placed in the reverse Trendelenburg position and the right side tilted  slightly up.  An 11 mm trocar was placed through an epigastric incision and  two 5 mm trocars were placed through right mid-abdominal incisions.  The  fundus of the gallbladder was grasped and retracted toward the right  shoulder.  Then adhesions between the omentum and the gallbladder were taken  down bluntly.  The infundibulum was grasped and mobilized.  An anterior  branch of the cystic artery was identified anterior to the cystic duct.  It  was isolated.  A window was then created around it.  It was clipped and  divided.  The cystic duct was identified and a window created around it as  well.  It was clipped at the cystic duct/gallbladder junction.  A small  incision was made at the cystic duct and a cholangio-catheter was passed  through the anterior abdominal wall  and placed to the cystic duct.  A  cholangiogram was then performed.  Under real time fluoroscopy, dilute contrast material was injected to the  cystic duct which was of long length.  The common hepatic, right and left  hepatic and the common bile ducts all filled properly.  The common bile duct  drained promptly to the duodenum.  There was no obvious evidence of  obstruction.  The final report is pending the radiologist's interpretation.  The cholangio-catheter was then removed.  The cystic duct was clipped three  times staying inside and divided.  The posterior branch of the cystic artery  was then clipped and divided.  The gallbladder was dissected free from the  liver bed using electrocautery, and then the gallbladder was placed in an  Endopouch bag.  The gallbladder fossa was irrigated and bleeding controlled with cautery.  It was irrigated again and inspected and no bleeding or bowel leakage was  noted.  The perihepatic area was then irrigated with fluid, and the fluid  evacuated.  The gallbladder was removed in the Endopouch bag through the subumbilical  port.  The subumbilical fascial defect was then closed under laparoscopic  vision by tightening and tying down the pursestring suture.  The remaining  irrigation fluid was evacuated.  The trocars were removed and the  pneumoperitoneum was released.  The skin incisions were closed with #4-0 Monocryl subcuticular stitches.  Steri-Strips and sterile dressings were applied.  She tolerated the  procedure well without any apparent complications, and was taken to the  recovery room in satisfactory condition.                                               Adolph Pollack, M.D.    Kari Baars  D:  02/19/2004  T:  02/19/2004  Job:  [id number]   cc:   Doylene Canning. Ladona Ridgel, M.D.   Nadine Counts, M.D.   Ulyess Mort, M.D. [facility]
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
## 2011-03-13 NOTE — Procedures (Signed)
[place]. [facility]  Patient:    LADONA, ROSTEN [facility]                      MRN: [id number] Proc. Date: 08/11/00 Adm. Date:  [id number] Attending:  Nathen May CC:         Kathrine Cords, [facility]   Procedure Report  PROCEDURE:  Invasive electrophysiologic testing.  INDICATION:  Near-syncope in a patient with a history of inducible, nonsustained polymorphic ventricular tachycardia, on sotalol.  INTRODUCTION:  The patient is a 65 year old woman who has a history of nonsustained polymorphic ventricular tachycardia and near-syncope.  This nonsustained polymorphic VT was demonstrated at EP testing several years ago. She was at that time placed on sotalol and did fairly well until several days ago, when while she was sitting on the couch had another episode of near-syncope associated with tachypalpitations.  This lasted no more than 10-15 seconds and was resolved spontaneously.  She subsequently was referred for evaluation and found to have preserved LV systolic function.  She is now referred for electrophysiologic testing.  DESCRIPTION OF PROCEDURE:  After informed consent was obtained, the patient was taken to the diagnostic EP lab in the fasting state.  After the usual preparation and draping, intravenous fentanyl and midazolam was given for sedation.  A 5 French quadripolar catheter was inserted percutaneously in the right femoral vein and advanced to the RV apex.  A 5 French quadripolar catheter was inserted percutaneously in the right femoral vein and advanced to the His bundle region.  A 5 French quadripolar catheter was inserted percutaneously in the right femoral vein and advanced to the right atrium. After measurement of the basic intervals, rapid ventricular pacing was carried out from the RV apex, and this demonstrated V-A dissociation with a cycle length of greater than or equal to 600 milliseconds.  Next, programmed ventricular  stimulation was carried out from the RV apex at base stress cycle lengths of both 600 and 400 milliseconds.  S1-S1, S1-S2-S3, and S1-S2-S3-S4 stimuli were delivered with the S1-S2, S2-S3, and S3-S4 intervals stepwise decreased down to ventricular fractions.  During programmed ventricular stimulation from the RV apex, there was no inducible VT.  The patient had at most four to five beats of nonsustained VT.  She was asymptomatic with this. Next, the catheter was maneuvered into the artery outflow tract and additional programmed ventricular stimulation was carried out, again at a basic drive cycle length of 147 as well as 400 milliseconds.  Again, S1-S2, S1-S2-S3, and S1-S2-S3-S4 stimuli were delivered with the S1-S2, S2-S3, and S3-S4 intervals stepwise decreased down to ventricular fractions.  Once again there was no inducible VT.  Next, rapid atrial pacing was carried out from the _____ atrium, and a pacing cycle length of 490 milliseconds, stepwise decreased down to 39 milliseconds, where the PR interval was equal to but not greater than the R interval.  There was no inducible SVT.  Next, programmed atrial stimulation was carried out from _____ atrium at a basic drive cycle length of 829 milliseconds.  The S1-S2 interval was stepwise decreased down to 210 milliseconds, where atrial refractoriness was observed.  During programmed atrial stimulation, there was no A-H jump.  Interestingly, the H-V interval was somewhat prolonged during programmed atrial stimulation, going from a baseline H-V of 36 milliseconds to an HV of 95 milliseconds, and an S1-S2 interval of 600/220.  At this point, the catheters were removed, hemostasis was moved, and the patient returned  to her room in good.  COMPLICATIONS:  None.  RESULTS: Baseline 12-lead ECG: The baseline 12-lead ECG demonstrates normal sinus rhythm, normal axis, and normal intervals.  Baseline intervals: Sinus node cycle length:  905  milliseconds. QRS duration:  100 milliseconds. Q-T interval:  470 milliseconds. A-H interval:  87 milliseconds. H-V interval:  36 milliseconds.  Rapid ventricular pacing: Rapid ventricular pacing was carried out from the RV apex at a pacing cycle length of 600 milliseconds, demonstrating V-A dissociation.  Programmed ventricular stimulation: Programmed ventricular stimulation was carried out from the RV apex as well as the RV outflow tract at basic drive cycle length of 161 and 400 milliseconds. In addition, long-short intervals were delivered at a pacing cycle length of 400/600.  S1-S2, S1-S2-S3, and S1-S2-S3-S4 stimuli were delivered with the S1-S2, S2-S3, and S3-S4 intervals stepwise decreased down to ventricular fractions.   During programmed ventricular stimulation, there was no inducible VT.  Rapid atrial pacing: Rapid atrial pacing was carried out from the _____ atrium at a pacing cycle length of 500 milliseconds and stepwise decreased down to 490 milliseconds, where A-V Wenckebach was observed.  During rapid atrial pacing, the P-R interval was equal to but not greater than the R interval, and there was no inducible SVT.  Programmed atrial stimulation: Programmed atrial stimulation was carried out from the _____ atrium at a basic drive cycle length of 096 milliseconds.  The S1-S2 interval was stepwise decreased down to 210 milliseconds, where atrial refractoriness was observed. During programmed atrial stimulation, there were no A-H jumps.  The H-V interval, however, prolonged along with the H-V interval from an initial A-H interval of 36 milliseconds to a final H-V interval of 95 milliseconds.  CONCLUSION:  This study demonstrates no evidence of a substrate for inducible ventricular tachycardia.  At this time, the recommendations will be to follow her up with outpatient monitoring. DD:  08/11/00 TD:  08/12/00 Job: [id number]

## 2011-03-13 NOTE — Op Note (Signed)
NAME:  Patricia Alvarado, Patricia Alvarado NO.:  [id number]   MEDICAL RECORD NO.:  [id number]          PATIENT TYPE:  AMB   LOCATION:  SDS                          FACILITY:  [facility]   PHYSICIAN:  Hewitt Shorts, M.D.DATE OF BIRTH:  [birth year]   DATE OF PROCEDURE:  08/26/2005  DATE OF DISCHARGE:                                 OPERATIVE REPORT   PREOPERATIVE DIAGNOSIS:  Bilateral carpal tunnel syndrome, right worse than  left.   POSTOPERATIVE DIAGNOSIS:  Bilateral carpal tunnel syndrome, right worse than  left.   PROCEDURE:  Right carpal tunnel release.   SURGEON:  Hewitt Shorts, M.D.   ANESTHESIA:  Bier block with intravenous sedation.   INDICATION:  The patient is a 65 year old woman with bilateral carpal tunnel  syndrome, right worse than left.  The patient is admitted now for right  carpal tunnel release.   PROCEDURE:  The patient was brought to operating room.  A Bier block was  established in the right upper extremity by the anesthesia service and the  right upper extremity was prepped with Betadine soaping solution and draped  in a sterile fashion.  A curvilinear incision was made just medial to the  thenar crease and extending from the distal carpal crease distally.  Dissection was carried down through the subcutaneous tissue to the  transverse carpal ligament, which was carefully divided from a distal to a  proximal extent.  Significant thickening and tightness of the ligament was  noted and we were able to decompress the underlying median nerve.  Care was  taken to avoid any injury to the underlying nerve and in the end, it felt  that good decompression with complete transection of the transverse carpal  ligament was achieved.  Once the decompression was completed, we proceeded  with closure.  The subcuticular layer was closed with interrupted and  inverted 2-0 undyed Vicryl sutures and the skin edges were approximated with  interrupted horizontal mattress  sutures using a 4-0 nylon suture.  The wound  was dressed with Adaptic, gauze fluffs and wrapped with a Kling.  Once the  Desma Paganini was applied, the tourniquet was released.  The procedure was tolerated  well.  The estimated blood loss was nil.  Sponge and needle count were  correct.  Following surgery, the patient was to be transferred to the  recovery room for further care.      Hewitt Shorts, M.D.  Electronically Signed     RWN/MEDQ  D:  08/26/2005  T:  08/26/2005  Job:  [id number]

## 2011-03-13 NOTE — Discharge Summary (Signed)
[place]. [facility]  Patient:    Patricia Alvarado, Patricia Alvarado [facility]                      MRN: [id number] Adm. Date:  [id number] Attending:  Nathen May Dictator:   Diane _______ C.R.N.P. CC:         Earl Many, M.D.  Jonelle Sports. Cheryll Cockayne, M.D.  Deanna Kipp Brood, N.P., [place], [place]  Janet Berlin. Dan Humphreys, M.D.   Discharge Summary  CHIEF COMPLAINT:  Presyncope.  HISTORY OF PRESENT ILLNESS:  This is a 65 year old female who was admitted to [facility] after an episode of presyncope.  She had felt palpitations in her chest Monday at 10:15 while sitting on her couch, felt dizzy, lightheaded and had blurring of her vision.  No loss of consciousness.  This lasted approximately 10-15 seconds.  She states it is the same as when her ventricular tachycardia was induced in 1993.  She denies chest pain, shortness of breath or palpitations.  It resolved spontaneously.  She was taken to the emergency room by her husband.  The patient was noted to have bigeminy upon on admission.  Upon admission, TSH was noted to be less than 0.08.  She was transferred to [facility] for an EP study.  PAST MEDICAL HISTORY:  Inducible ventricular tachycardia.  She had been stable on Betapace.  Mitral valve prolapse, 2-D echocardiogram showing mitral regurgitation which was mild to moderate, hypercholesterolemia, basal cell of the bridge of the nose which has been removed, bilateral saline breast implants for breast disease with a leaking of her right implant which was to be replaced on August 22, 2000.  The patient also had recent C-spine surgery, a total thyroidectomy as well as a partial parathyroidectomy in March 2001. Denies tobacco or alcohol.  Positive caffeine use.  Negative illicit drug use.  LABORATORY DATA:  On admission on August 09, 2000, TSH less than 0.08, T4 16.8, T3 1.22, T7 13.8.  Sodium 143, potassium 4.3, chloride 105, CO2 30, glucose 94, BUN 11, creatinine 0.8.   Cardiac enzymes were negative.  Chest x-ray showed no apparent disease.  The EKG was normal sinus rhythm.  HOSPITAL COURSE:  The patient underwent an EP study.  EPS was performed without complications.  She had no inducible VT or SVT.  The patient was to be maintained on her sotalol.  DISCHARGE INSTRUCTIONS:  She was to have an outpatient 30 day monitor performed with Dr. Sherril Croon.  The patient was also to follow with Dr. Severe for her endocrine problems.  An appointment was made for Tuesday, August 19, 2000, at 11:30 a.m.  The patient was also instructed as per instructions from Dr. Severe to hold her Synthroid until she had followed with him.  The patient as per Dr. Ladona Ridgel would be a no risk for major cardiac complications for her breast implant surgery on August 15, 2000. DD:  08/12/00 TD:  08/13/00 Job: [id number]

## 2011-03-13 NOTE — Assessment & Plan Note (Signed)
[facility]                           ELECTROPHYSIOLOGY OFFICE NOTE   NEHAL, WITTING                        MRN:          [id number]  DATE:06/09/2006                            DOB:          05-Oct-1946    Patricia Alvarado returned today for follow-up.  She is a very pleasant, middle-  aged woman with a history of symptomatic ventricular tachycardia with  preserved LV function who underwent electrophysiology testing back in 1993.  At that time she had nonsustained VT and for this reason she was placed on  sotalol.  The patient has had continued to have preserved LV function.  I  saw her two years ago and at that time she had had two episodes of near  syncope, but no complete syncope.  The patient at that time elected to  undergo a period of watchful waiting as she had other medical problems  ongoing.  She returns today for follow-up and has been stable.  She has had  rare palpitations.  She has had no syncope or near syncope.  She denies  chest pain or shortness of breath.  Otherwise, she has been stable.   PHYSICAL EXAMINATION:  GENERAL:  She is a pleasant, well-appearing, middle-  aged woman in no distress.  VITAL SIGNS:  Blood pressure 116/70, the pulse 57 and regular, respirations  were 18, the weight was 157 pounds, up 5 pounds from her visit back in March  of 2005.  NECK:  No jugular venous distention.  LUNGS:  Clear bilaterally to auscultation.  CARDIOVASCULAR:  Regular rate and rhythm with normal S1, S2.  EXTREMITIES:  No edema.   The EKG demonstrates sinus rhythm with nonspecific T-wave abnormality.  The  QT interval was approximately 470 milliseconds.   IMPRESSION:  1. Symptomatic nonsustained ventricular tachycardia now on long-term      sotalol therapy (greater than 12 years).  2. History of thyroid problems on Levoxyl.   DISCUSSION:  Overall, Ms. Campanile is stable.  I will plan to see her back  in a year.  She will need regular  creatinine evaluations while she is on  sotalol.                                   Doylene Canning. Ladona Ridgel, MD   GWT/MedQ  DD:  06/09/2006  DT:  06/09/2006  Job #:  [id number]   cc:   Nadine Counts

## 2011-03-24 ENCOUNTER — Encounter: Payer: Self-pay | Admitting: Internal Medicine

## 2011-04-02 ENCOUNTER — Encounter: Payer: Self-pay | Admitting: Internal Medicine

## 2011-04-03 ENCOUNTER — Ambulatory Visit (INDEPENDENT_AMBULATORY_CARE_PROVIDER_SITE_OTHER): Payer: Commercial Managed Care - PPO | Admitting: Internal Medicine

## 2011-04-03 ENCOUNTER — Encounter: Payer: Self-pay | Admitting: Internal Medicine

## 2011-04-03 VITALS — BP 128/66 | HR 82 | Resp 18 | Ht 65.0 in | Wt 156.0 lb

## 2011-04-03 DIAGNOSIS — I472 Ventricular tachycardia: Secondary | ICD-10-CM

## 2011-04-03 MED ORDER — FLECAINIDE ACETATE 100 MG PO TABS: 100.0000 mg | ORAL_TABLET | Freq: Two times a day (BID) | ORAL | Status: DC

## 2011-04-03 NOTE — Patient Instructions (Signed)
Your physician wants you to follow-up in: 1 YEAR.  You will receive a reminder letter in the mail two months in advance. If you don't receive a letter, please call our office to schedule the follow-up appointment.  Your physician recommends that you continue on your current medications as directed. Please refer to the Current Medication list given to you today.  

## 2011-04-03 NOTE — Progress Notes (Signed)
HPI Patricia Alvarado returns today for followup. She is a middle-aged woman with a history of paroxysmal ventricular tachycardia. Patient underwent EP study in the past and had 2 different foci. One appear to be successfully ablated. She was placed on flecainide. She is a well since then. She denies chest pain she has very minimal palpitations. No syncope. Allergies  Allergen Reactions  . Amoxicillin     REACTION: rash and itching  . Atenolol     REACTION: causes hives  . Atorvastatin     REACTION: muscles pain  . Nadolol     REACTION: causes hives  . Quinidine     REACTION: increased heart rate     Current Outpatient Prescriptions  Medication Sig Dispense Refill  . aspirin 81 MG EC tablet Take 81 mg by mouth daily.        . Coenzyme Q10 (COQ10) 100 MG CAPS Take 1 capsule by mouth daily.        . diphenhydrAMINE (SOMINEX) 25 MG tablet Take 25 mg by mouth at bedtime as needed.        Marland Kitchen escitalopram (LEXAPRO) 20 MG tablet Take 20 mg by mouth daily.        Marland Kitchen estrogens, conjugated, (PREMARIN) 0.3 MG tablet Take 0.3 mg by mouth daily. Take daily for 21 days then do not take for 7 days.       . Flaxseed, Linseed, (FLAX SEED OIL) 1000 MG CAPS Take 1 capsule by mouth daily.        . flecainide (TAMBOCOR) 100 MG tablet Take 1 tablet (100 mg total) by mouth 2 (two) times daily.  180 tablet  3  . levothyroxine (SYNTHROID, LEVOTHROID) 150 MCG tablet Take 150 mcg by mouth daily.        . Magnesium 250 MG TABS Take 3 tablets by mouth 2 (two) times daily.        . Manganese Gluconate 50 MG TABS Take 1 tablet by mouth daily.        . methocarbamol (ROBAXIN) 500 MG tablet Take 500 mg by mouth as needed.        . Multiple Vitamin (MULTIVITAMIN) capsule Take 1 capsule by mouth daily.        . naproxen sodium (ANAPROX) 220 MG tablet Take 440 mg by mouth as needed.        . niacin 500 MG CR capsule Take 1,000 mg by mouth at bedtime.        . TraMADol HCl 50 MG TBSO Take by mouth as needed.        .  Vitamins-Lipotropics (B-100 COMPLEX) TABS Take 1 tablet by mouth daily.        Marland Kitchen DISCONTD: flecainide (TAMBOCOR) 100 MG tablet TAKE 1 TABLET BY MOUTH TWICE DAILY  180 tablet  0  . DISCONTD: calcium carbonate (TUMS - DOSED IN MG ELEMENTAL CALCIUM) 500 MG chewable tablet Chew 1 tablet by mouth 2 (two) times daily.        Marland Kitchen DISCONTD: Cholecalciferol (VITAMIN D) 1000 UNITS capsule Take 1,000 Units by mouth daily.        Marland Kitchen DISCONTD: DULoxetine (CYMBALTA) 60 MG capsule Take 60 mg by mouth daily.        Marland Kitchen DISCONTD: levothyroxine (SYNTHROID, LEVOTHROID) 125 MCG tablet Take 125 mcg by mouth daily.        Marland Kitchen DISCONTD: selenium 50 MCG TABS Take 50 mcg by mouth daily.        Marland Kitchen DISCONTD: sotalol (BETAPACE) 160 MG tablet  Take 160 mg by mouth 2 (two) times daily.        Marland Kitchen DISCONTD: vitamin A 8000 UNIT capsule Take 8,000 Units by mouth daily.        Marland Kitchen DISCONTD: vitamin C (ASCORBIC ACID) 500 MG tablet Take 500 mg by mouth daily.           Past Medical History  Diagnosis Date  . Dyslipidemia   . Ventricular tachycardia     Hx of, controlled on sotalol therapy  . Depression   . Fibromyalgia   . Fibrocystic breast   . Carcinoma of thyroid gland   . Basal cell carcinoma   . Dyslipidemia   . H/O: hysterectomy     ROS:   All systems reviewed and negative except as noted in the HPI.   Past Surgical History  Procedure Date  . Cholecystectomy   . Carpal tunnel release 2006    bilateral  . Cervical discectomy 2001  . Thyroidectomy 2011  . Ventricular ablation surgery 2011    ventricular tchycardia     Family History  Problem Relation Age of Onset  . Cancer Brother   . Thyroid cancer Neg Hx   . Breast cancer Other   . Hyperlipidemia Mother 89    has osteoporosis  . Heart disease Father 26    died  . Other Brother     has 2 bypass surgeries     History   Social History  . Marital Status: Married    Spouse Name: N/A    Number of Children: N/A  . Years of Education: N/A    Occupational History  . [employer]   Social History Main Topics  . Smoking status: Never Smoker   . Smokeless tobacco: Not on file  . Alcohol Use: No  . Drug Use: No  . Sexually Active: Not on file   Other Topics Concern  . Not on file   Social History Narrative   Married     BP 128/66  Pulse 82  Resp 18  Ht 5\' 5"  (1.651 m)  Wt 156 lb (70.761 kg)  BMI 25.96 kg/m2  Physical Exam:  Well appearing NAD HEENT: Unremarkable Neck:  No JVD, no thyromegally Lymphatics:  No adenopathy Back:  No CVA tenderness Lungs:  Clear HEART:  Regular rate rhythm, no murmurs, no rubs, no clicks Abd:  Flat, positive bowel sounds, no organomegally, no rebound, no guarding Ext:  2 plus pulses, no edema, no cyanosis, no clubbing Skin:  No rashes no nodules Neuro:  CN II through XII intact, motor grossly intact  EKG Sinus rhythm.  Assess/Plan:

## 2011-04-03 NOTE — Assessment & Plan Note (Signed)
The patient's symptoms appear to be well controlled on her current medical therapy. Continue current medical therapy

## 2012-05-09 ENCOUNTER — Other Ambulatory Visit: Payer: Self-pay | Admitting: Internal Medicine

## 2012-06-06 ENCOUNTER — Encounter: Payer: Self-pay | Admitting: Internal Medicine

## 2012-06-06 ENCOUNTER — Ambulatory Visit (INDEPENDENT_AMBULATORY_CARE_PROVIDER_SITE_OTHER): Payer: Commercial Managed Care - PPO | Admitting: Internal Medicine

## 2012-06-06 VITALS — BP 140/76 | HR 85 | Ht 65.0 in | Wt 168.0 lb

## 2012-06-06 DIAGNOSIS — E785 Hyperlipidemia, unspecified: Secondary | ICD-10-CM

## 2012-06-06 DIAGNOSIS — I472 Ventricular tachycardia: Secondary | ICD-10-CM

## 2012-06-06 NOTE — Patient Instructions (Signed)
Your physician wants you to follow-up in: 12 months with Dr. Taylor. You will receive a reminder letter in the mail two months in advance. If you don't receive a letter, please call our office to schedule the follow-up appointment.    

## 2012-06-06 NOTE — Assessment & Plan Note (Signed)
She is maintaining sinus rhythm and has had no symptomatic arrhythmias in the last year. Continue her current medical therapy.

## 2012-06-06 NOTE — Assessment & Plan Note (Signed)
She will continue her current medical therapy. I've asked her to eat less, lose weight, and exercise more.

## 2012-06-06 NOTE — Progress Notes (Signed)
HPI Patricia Alvarado returns today for followup. She is a very pleasant middle-age woman with ventricular tachycardia, hypertension, and dyslipidemia. In the interim, she has done well. She denies chest pain, shortness of breath, or palpitations. No symptomatic arrhythmias. She does admit to some dietary indiscretion. She has gained weight in the last year. Allergies  Allergen Reactions  . Amoxicillin     REACTION: rash and itching  . Atenolol     REACTION: causes hives  . Atorvastatin     REACTION: muscles pain  . Nadolol     REACTION: causes hives  . Quinidine     REACTION: increased heart rate     Current Outpatient Prescriptions  Medication Sig Dispense Refill  . aspirin 81 MG EC tablet Take 81 mg by mouth daily.        Marland Kitchen BIOTIN FORTE PO Take by mouth daily.      . Coenzyme Q10 (COQ10) 100 MG CAPS Take 1 capsule by mouth daily.        . diphenhydrAMINE (SOMINEX) 25 MG tablet Take 25 mg by mouth at bedtime as needed.        Marland Kitchen escitalopram (LEXAPRO) 20 MG tablet Take 20 mg by mouth daily.        Marland Kitchen estrogens, conjugated, (PREMARIN) 0.3 MG tablet Take 0.3 mg by mouth daily. Take daily for 21 days then do not take for 7 days.       . Flaxseed, Linseed, (FLAX SEED OIL) 1000 MG CAPS Take 1 capsule by mouth daily.        . flecainide (TAMBOCOR) 100 MG tablet TAKE 1 TABLET BY MOUTH 2 TIMES DAILY.  180 tablet  3  . levothyroxine (SYNTHROID, LEVOTHROID) 150 MCG tablet Take 150 mcg by mouth daily.        . Magnesium 250 MG TABS Take 3 tablets by mouth 2 (two) times daily.        . Manganese Gluconate 50 MG TABS Take 1 tablet by mouth daily.        . Multiple Vitamin (MULTIVITAMIN) capsule Take 1 capsule by mouth daily.        . naproxen sodium (ANAPROX) 220 MG tablet Take 440 mg by mouth as needed.        . niacin 500 MG CR capsule Take 1,000 mg by mouth at bedtime.        . Probiotic Product (SOLUBLE FIBER/PROBIOTICS PO) Take by mouth daily.      . Psyllium (METAMUCIL PO) Take by mouth 3  (three) times daily.      . TraMADol HCl 50 MG TBSO Take by mouth as needed.        . Vitamins-Lipotropics (B-100 COMPLEX) TABS Take 1 tablet by mouth daily.           Past Medical History  Diagnosis Date  . Dyslipidemia   . Ventricular tachycardia     Hx of, controlled on sotalol therapy  . Depression   . Fibromyalgia   . Fibrocystic breast   . Carcinoma of thyroid gland   . Basal cell carcinoma   . Dyslipidemia   . H/O: hysterectomy     ROS:   All systems reviewed and negative except as noted in the HPI.   Past Surgical History  Procedure Date  . Cholecystectomy   . Carpal tunnel release 2006    bilateral  . Cervical discectomy 2001  . Thyroidectomy 2011  . Ventricular ablation surgery 2011    ventricular tchycardia  Family History  Problem Relation Age of Onset  . Cancer Brother   . Thyroid cancer Neg Hx   . Breast cancer Other   . Hyperlipidemia Mother 48    has osteoporosis  . Heart disease Father 42    died  . Other Brother     has 2 bypass surgeries     History   Social History  . Marital Status: Married    Spouse Name: N/A    Number of Children: N/A  . Years of Education: N/A   Occupational History  . [employer]   Social History Main Topics  . Smoking status: Never Smoker   . Smokeless tobacco: Never Used  . Alcohol Use: No  . Drug Use: No  . Sexually Active: Not on file   Other Topics Concern  . Not on file   Social History Narrative   Married     BP 140/76  Pulse 85  Ht 5\' 5"  (1.651 m)  Wt 168 lb (76.204 kg)  BMI 27.96 kg/m2  Physical Exam:  Well appearing middle-aged woman, NAD HEENT: Unremarkable Neck:  No JVD, no thyromegally Lungs:  Clear with no wheezes, rales, or rhonchi. HEART:  Regular rate rhythm, no murmurs, no rubs, no clicks Abd:  soft, positive bowel sounds, no organomegally, no rebound, no guarding Ext:  2 plus pulses, no edema, no cyanosis, no clubbing Skin:  No rashes no  nodules Neuro:  CN II through XII intact, motor grossly intact  EKG Normal sinus rhythm with nonspecific T wave abnormality.  Assess/Plan:

## 2013-03-16 ENCOUNTER — Encounter: Payer: Self-pay | Admitting: Internal Medicine

## 2013-05-15 ENCOUNTER — Other Ambulatory Visit: Payer: Self-pay | Admitting: Internal Medicine

## 2013-05-30 ENCOUNTER — Other Ambulatory Visit: Payer: Self-pay | Admitting: *Deleted

## 2013-05-30 MED ORDER — FLECAINIDE ACETATE 100 MG PO TABS: ORAL_TABLET | ORAL | Status: DC

## 2013-06-06 ENCOUNTER — Ambulatory Visit: Payer: Commercial Managed Care - PPO | Admitting: Internal Medicine

## 2013-06-07 ENCOUNTER — Encounter: Payer: Self-pay | Admitting: Internal Medicine

## 2013-06-07 ENCOUNTER — Ambulatory Visit (INDEPENDENT_AMBULATORY_CARE_PROVIDER_SITE_OTHER): Payer: Medicare Other | Admitting: Internal Medicine

## 2013-06-07 VITALS — BP 150/72 | HR 74 | Ht 65.0 in | Wt 166.2 lb

## 2013-06-07 DIAGNOSIS — I472 Ventricular tachycardia: Secondary | ICD-10-CM

## 2013-06-07 MED ORDER — FLECAINIDE ACETATE 100 MG PO TABS: ORAL_TABLET | ORAL | Status: DC

## 2013-06-07 NOTE — Patient Instructions (Signed)
Your physician wants you to follow-up in: 12 months with Dr. Taylor. You will receive a reminder letter in the mail two months in advance. If you don't receive a letter, please call our office to schedule the follow-up appointment.    

## 2013-06-07 NOTE — Assessment & Plan Note (Signed)
Her ventricular arrhythmias appear to be fairly well controlled on flecainide therapy. We discussed the possibility of taking an additional dose of flecainide if she is having a bad day with lots of symptoms. We also discussed the possibility of adding a beta blocker. Will base additional treatment options on her symptoms.

## 2013-06-07 NOTE — Progress Notes (Signed)
HPI [name] returns today for followup. She is a very pleasant 67 year old woman with a history of nonsustained ventricular tachycardia and frequent PVCs, hypertension, and obesity. In the interim, she has had occasional palpitations which have been fairly well-controlled on flecainide therapy. Previously, she was treated with sotalol but developed fatigue and weakness on this medication. She takes her flecainide twice a day. No syncope and no dizzy spells. She does have mild fatigue. When she exercises, she notes that her palpitations increase in frequency. Allergies  Allergen Reactions  . Amoxicillin     REACTION: rash and itching  . Atenolol     REACTION: causes hives  . Atorvastatin     REACTION: muscles pain  . Nadolol     REACTION: causes hives  . Quinidine     REACTION: increased heart rate     Current Outpatient Prescriptions  Medication Sig Dispense Refill  . aspirin 81 MG EC tablet Take 81 mg by mouth daily.        Marland Kitchen BIOTIN FORTE PO Take by mouth daily.      . Coenzyme Q10 (COQ10) 100 MG CAPS Take 1 capsule by mouth daily.        . diphenhydrAMINE (SOMINEX) 25 MG tablet Take 25 mg by mouth at bedtime as needed.        Marland Kitchen escitalopram (LEXAPRO) 20 MG tablet Take 20 mg by mouth daily.        . Flaxseed, Linseed, (FLAX SEED OIL) 1000 MG CAPS Take 1 capsule by mouth daily.        . flecainide (TAMBOCOR) 100 MG tablet TAKE 1 TABLET TWICE A DAY and my take 1 additional pill per week if needed  192 tablet  3  . levothyroxine (SYNTHROID, LEVOTHROID) 150 MCG tablet Take 125 mcg by mouth daily.       . Magnesium 250 MG TABS Take 3 tablets by mouth 2 (two) times daily.        . Manganese Gluconate 50 MG TABS Take 1 tablet by mouth daily.        . Multiple Vitamin (MULTIVITAMIN) capsule Take 1 capsule by mouth daily.        . naproxen sodium (ANAPROX) 220 MG tablet Take 440 mg by mouth as needed.        . niacin 500 MG CR capsule Take 1,000 mg by mouth at bedtime.        .  Probiotic Product (SOLUBLE FIBER/PROBIOTICS PO) Take by mouth daily.      . Psyllium (METAMUCIL PO) Take by mouth daily.       . TraMADol HCl 50 MG TBSO Take by mouth as needed.        . Vitamins-Lipotropics (B-100 COMPLEX) TABS Take 1 tablet by mouth daily.         No current facility-administered medications for this visit.     Past Medical History  Diagnosis Date  . Dyslipidemia   . Ventricular tachycardia     Hx of, controlled on sotalol therapy  . Depression   . Fibromyalgia   . Fibrocystic breast   . Carcinoma of thyroid gland   . Basal cell carcinoma   . Dyslipidemia   . H/O: hysterectomy     ROS:   All systems reviewed and negative except as noted in the HPI.   Past Surgical History  Procedure Laterality Date  . Cholecystectomy    . Carpal tunnel release  2006    bilateral  . Cervical discectomy  2001  . Thyroidectomy  2011  . Ventricular ablation surgery  2011    ventricular tchycardia     Family History  Problem Relation Age of Onset  . Cancer Brother   . Thyroid cancer Neg Hx   . Breast cancer Other   . Hyperlipidemia Mother 70    has osteoporosis  . Heart disease Father 73    died  . Other Brother     has 2 bypass surgeries     History   Social History  . Marital Status: Married    Spouse Name: N/A    Number of Children: N/A  . Years of Education: N/A   Occupational History  . [employer]   Social History Main Topics  . Smoking status: Never Smoker   . Smokeless tobacco: Never Used  . Alcohol Use: No  . Drug Use: No  . Sexual Activity: Not on file   Other Topics Concern  . Not on file   Social History Narrative   Married     BP 150/72  Pulse 74  Ht 5\' 5"  (1.651 m)  Wt 166 lb 3.2 oz (75.388 kg)  BMI 27.66 kg/m2  Physical Exam:  Well appearing 67 year old woman, NAD HEENT: Unremarkable Neck:  7 cm JVD, no thyromegally Back:  No CVA tenderness Lungs:  Clear with no wheezes, rales, or rhonchi. HEART:   Regular rate rhythm, no murmurs, no rubs, no clicks Abd:  soft, positive bowel sounds, no organomegally, no rebound, no guarding Ext:  2 plus pulses, no edema, no cyanosis, no clubbing Skin:  No rashes no nodules Neuro:  CN II through XII intact, motor grossly intact  EKG - normal sinus rhythm with PVCs    Assess/Plan:and

## 2013-08-31 ENCOUNTER — Other Ambulatory Visit: Payer: Self-pay

## 2014-05-25 ENCOUNTER — Encounter: Payer: Self-pay | Admitting: Internal Medicine

## 2014-06-07 ENCOUNTER — Ambulatory Visit (INDEPENDENT_AMBULATORY_CARE_PROVIDER_SITE_OTHER): Payer: Medicare HMO | Admitting: Internal Medicine

## 2014-06-07 ENCOUNTER — Encounter: Payer: Self-pay | Admitting: Internal Medicine

## 2014-06-07 VITALS — BP 128/72 | HR 74 | Ht 65.0 in | Wt 155.8 lb

## 2014-06-07 DIAGNOSIS — I472 Ventricular tachycardia, unspecified: Secondary | ICD-10-CM

## 2014-06-07 DIAGNOSIS — I4729 Other ventricular tachycardia: Secondary | ICD-10-CM

## 2014-06-07 DIAGNOSIS — E785 Hyperlipidemia, unspecified: Secondary | ICD-10-CM

## 2014-06-07 MED ORDER — FLECAINIDE ACETATE 100 MG PO TABS: 100.0000 mg | ORAL_TABLET | Freq: Two times a day (BID) | ORAL | Status: DC

## 2014-06-07 NOTE — Patient Instructions (Signed)
Your physician has recommended you make the following change in your medication:  1) REDUCE Flecainide to 100mg  in the am and 50mg  in the pm. A refill has been sent to your pharmacy   Your physician wants you to follow-up in: 1 year with Dr.Taylor You will receive a reminder letter in the mail two months in advance. If you don't receive a letter, please call our office to schedule the follow-up appointment.

## 2014-06-07 NOTE — Assessment & Plan Note (Signed)
I have reviewed her lipid panel. While her LDL and total cholesterol are high, her HDL is very high and protective. She will continue to exercise and maintain a low fat diet.

## 2014-06-07 NOTE — Assessment & Plan Note (Signed)
Her arrhythmias are well controlled. She will return in a year. She may reduce her dose of flecainide to 100/50 and on down to 50/50 morning and evening.

## 2014-06-07 NOTE — Progress Notes (Signed)
HPI Patricia Alvarado returns today for followup. She is a very pleasant 68 year old woman with a history of nonsustained ventricular tachycardia and frequent PVCs, hypertension, and obesity. In the interim, she has had occasional palpitations which have been fairly well-controlled on flecainide therapy. She takes her flecainide twice a day. No syncope and no dizzy spells. She does have mild fatigue. When she exercises, she notes that her palpitations increase in frequency. She has lost 10 lbs since retiring and starting to exercise regularly. Allergies  Allergen Reactions  . Amoxicillin     REACTION: rash and itching  . Atenolol     REACTION: causes hives  . Atorvastatin     REACTION: muscles pain  . Nadolol     REACTION: causes hives  . Quinidine     REACTION: increased heart rate     Current Outpatient Prescriptions  Medication Sig Dispense Refill  . aspirin 81 MG EC tablet Take 81 mg by mouth daily.        Marland Kitchen BIOTIN FORTE PO Take 1 capsule by mouth daily.       . Coenzyme Q10 (COQ10) 100 MG CAPS Take 1 capsule by mouth daily.        . diphenhydrAMINE (SOMINEX) 25 MG tablet Take 25 mg by mouth at bedtime as needed.        Marland Kitchen escitalopram (LEXAPRO) 20 MG tablet Take 20 mg by mouth daily.        . Flaxseed, Linseed, (FLAX SEED OIL) 1000 MG CAPS Take 1 capsule by mouth daily.        . flecainide (TAMBOCOR) 100 MG tablet TAKE 1 TABLET TWICE A DAY      . levothyroxine (SYNTHROID, LEVOTHROID) 150 MCG tablet Take 125 mcg by mouth daily.       . Magnesium 250 MG TABS Take 3 tablets by mouth 2 (two) times daily.        . Manganese Gluconate 50 MG TABS Take 1 tablet by mouth daily.        . Multiple Vitamin (MULTIVITAMIN) capsule Take 1 capsule by mouth daily.        . naproxen sodium (ANAPROX) 220 MG tablet Take 440 mg by mouth as needed.        . Probiotic Product (SOLUBLE FIBER/PROBIOTICS PO) Take 1 capsule by mouth daily.       . TraMADol HCl 50 MG TBSO Take by mouth as needed.        .  Vitamins-Lipotropics (B-100 COMPLEX) TABS Take 1 tablet by mouth daily.         No current facility-administered medications for this visit.     Past Medical History  Diagnosis Date  . Dyslipidemia   . Ventricular tachycardia     Hx of, controlled on sotalol therapy  . Depression   . Fibromyalgia   . Fibrocystic breast   . Carcinoma of thyroid gland   . Basal cell carcinoma   . Dyslipidemia   . H/O: hysterectomy     ROS:   All systems reviewed and negative except as noted in the HPI.   Past Surgical History  Procedure Laterality Date  . Cholecystectomy    . Carpal tunnel release  2006    bilateral  . Cervical discectomy  2001  . Thyroidectomy  2011  . Ventricular ablation surgery  2011    ventricular tchycardia     Family History  Problem Relation Age of Onset  . Cancer Brother   . Thyroid cancer Neg Hx   .  Breast cancer Other   . Hyperlipidemia Mother 49    has osteoporosis  . Heart disease Father 20    died  . Other Brother     has 2 bypass surgeries     History   Social History  . Marital Status: Married    Spouse Name: N/A    Number of Children: N/A  . Years of Education: N/A   Occupational History  . [employer]   Social History Main Topics  . Smoking status: Never Smoker   . Smokeless tobacco: Never Used  . Alcohol Use: No  . Drug Use: No  . Sexual Activity: Not on file   Other Topics Concern  . Not on file   Social History Narrative   Married     BP 128/72  Pulse 74  Ht 5\' 5"  (1.651 m)  Wt 155 lb 12.8 oz (70.67 kg)  BMI 25.93 kg/m2  Physical Exam:  Well appearing 68 year old woman, NAD HEENT: Unremarkable Neck:  7 cm JVD, no thyromegally Back:  No CVA tenderness Lungs:  Clear with no wheezes, rales, or rhonchi. HEART:  Regular rate rhythm, no murmurs, no rubs, no clicks Abd:  soft, positive bowel sounds, no organomegally, no rebound, no guarding Ext:  2 plus pulses, no edema, no cyanosis, no clubbing Skin:   No rashes no nodules Neuro:  CN II through XII intact, motor grossly intact  EKG - normal sinus rhythm    Assess/Plan:and

## 2014-09-04 ENCOUNTER — Other Ambulatory Visit: Payer: Self-pay | Admitting: *Deleted

## 2014-09-04 DIAGNOSIS — I472 Ventricular tachycardia, unspecified: Secondary | ICD-10-CM

## 2014-09-04 MED ORDER — FLECAINIDE ACETATE 100 MG PO TABS: 100.0000 mg | ORAL_TABLET | Freq: Two times a day (BID) | ORAL | Status: DC

## 2015-07-02 ENCOUNTER — Ambulatory Visit (INDEPENDENT_AMBULATORY_CARE_PROVIDER_SITE_OTHER): Payer: PPO | Admitting: Internal Medicine

## 2015-07-02 ENCOUNTER — Encounter: Payer: Self-pay | Admitting: Internal Medicine

## 2015-07-02 VITALS — BP 120/72 | HR 77 | Ht 65.0 in | Wt 145.6 lb

## 2015-07-02 DIAGNOSIS — I4729 Other ventricular tachycardia: Secondary | ICD-10-CM

## 2015-07-02 DIAGNOSIS — I472 Ventricular tachycardia: Secondary | ICD-10-CM

## 2015-07-02 NOTE — Assessment & Plan Note (Signed)
Her VT has been well controlled. She will continue flecainide. Her QRS duration is only minimally increased.

## 2015-07-02 NOTE — Progress Notes (Signed)
HPI Patricia Alvarado returns today for followup. She is a very pleasant 69 year old woman with a history of nonsustained ventricular tachycardia and frequent PVCs, hypertension, and obesity. In the interim, she has had occasional palpitations which have been fairly well-controlled on flecainide therapy. She takes her flecainide twice a day. No syncope and no dizzy spells. She does have mild fatigue. When she exercises, she has minimal symptoms. She has lost over 10 lbs since retiring and starting to exercise regularly. Allergies  Allergen Reactions  . Amoxicillin     REACTION: rash and itching  . Atenolol     REACTION: causes hives  . Atorvastatin     REACTION: muscles pain  . Nadolol     REACTION: causes hives  . Quinidine     REACTION: increased heart rate     Current Outpatient Prescriptions  Medication Sig Dispense Refill  . aspirin 81 MG EC tablet Take 81 mg by mouth daily.      . B Complex-C (SUPER B COMPLEX PO) Take 1 capsule by mouth at bedtime.    . Biotin 5 MG CAPS Take 1 capsule by mouth at bedtime.    . cetirizine (ZYRTEC) 10 MG tablet Take 10 mg by mouth daily as needed for allergies.     . Coenzyme Q10 (COQ10) 100 MG CAPS Take 1 capsule by mouth daily.      Marland Kitchen escitalopram (LEXAPRO) 20 MG tablet Take 20 mg by mouth daily.      Marland Kitchen FIBER PO Take 1 capsule by mouth daily.    . flecainide (TAMBOCOR) 100 MG tablet Take 1 tablet (100 mg total) by mouth 2 (two) times daily. 180 tablet 3  . ibuprofen (ADVIL,MOTRIN) 200 MG tablet Take 200 mg by mouth daily as needed (pain).     Marland Kitchen MAGNESIUM PO TRIPLE MAGNESIUM 400 mg - Take 1 capsule by mouth at night    . Manganese Gluconate 50 MG TABS Take 1 tablet by mouth daily.      Marland Kitchen MANGANESE PO Take 40 mg by mouth at bedtime.    . Melatonin 10 MG TABS Take 1 capsule by mouth at bedtime.    . mometasone (NASONEX) 50 MCG/ACT nasal spray Place 2 sprays into the nose daily as needed (allergies).     . naproxen sodium (ANAPROX) 220 MG tablet Take  440 mg by mouth daily as needed (pain).     Marland Kitchen OVER THE COUNTER MEDICATION Take 1 capsule by mouth daily. Niacin OTC    . PRESCRIPTION MEDICATION NATURE THYROID Take 1 tablet by mouth daily     No current facility-administered medications for this visit.     Past Medical History  Diagnosis Date  . Dyslipidemia   . Ventricular tachycardia     Hx of, controlled on sotalol therapy  . Depression   . Fibromyalgia   . Fibrocystic breast   . Carcinoma of thyroid gland   . Basal cell carcinoma   . Dyslipidemia   . H/O: hysterectomy     ROS:   All systems reviewed and negative except as noted in the HPI.   Past Surgical History  Procedure Laterality Date  . Cholecystectomy    . Carpal tunnel release  2006    bilateral  . Cervical discectomy  2001  . Thyroidectomy  2011  . Ventricular ablation surgery  2011    ventricular tchycardia     Family History  Problem Relation Age of Onset  . Cancer Brother     Skin  .  Other Brother     AGENT ORANGE and AntiLupus  . Heart disease Brother     Stents and bypass x2  . Arrhythmia Brother     AFIB  . Heart attack Brother   . Hypertension Brother   . Thyroid cancer Neg Hx   . Breast cancer Other   . Hyperlipidemia Mother 88  . Osteoporosis Mother   . Heart disease Father 23  . Other Father     Cardiac arrest     Social History   Social History  . Marital Status: Married    Spouse Name: N/A  . Number of Children: N/A  . Years of Education: N/A   Occupational History  . [employer]   Social History Main Topics  . Smoking status: Never Smoker   . Smokeless tobacco: Never Used  . Alcohol Use: No  . Drug Use: No  . Sexual Activity: Not on file   Other Topics Concern  . Not on file   Social History Narrative   Married     BP 120/72 mmHg  Pulse 77  Ht 5\' 5"  (1.651 m)  Wt 145 lb 9.6 oz (66.044 kg)  BMI 24.23 kg/m2  Physical Exam:  Well appearing 69 year old woman, NAD HEENT:  Unremarkable Neck:  7 cm JVD, no thyromegally Back:  No CVA tenderness Lungs:  Clear with no wheezes, rales, or rhonchi. HEART:  Regular rate rhythm, no murmurs, no rubs, no clicks Abd:  soft, positive bowel sounds, no organomegally, no rebound, no guarding Ext:  2 plus pulses, no edema, no cyanosis, no clubbing Skin:  No rashes no nodules Neuro:  CN II through XII intact, motor grossly intact  EKG - normal sinus rhythm with rare PVC    Assess/Plan:and

## 2015-07-02 NOTE — Patient Instructions (Signed)

## 2015-07-02 NOTE — Assessment & Plan Note (Signed)
She is encouraged to maintain a low fat diet. No medication changes.

## 2015-09-04 ENCOUNTER — Other Ambulatory Visit: Payer: Self-pay | Admitting: Internal Medicine

## 2015-11-20 ENCOUNTER — Telehealth: Payer: Self-pay | Admitting: Internal Medicine

## 2015-11-20 NOTE — Telephone Encounter (Signed)
New message    Patient calling C/O woke up this am with  frequent PVC. Took an extra flecainide

## 2015-11-20 NOTE — Telephone Encounter (Signed)
Called and spoke with patient about her symptoms.  She says when she woke up she could feel her heart and hear it   So she checked her pulse and she was having lots of PVC's.  It was time for her to take her morning dose.  She took the 100mg  of Flecainide and after an hour was still having pounding heart and PVC's.  She took and extra 50 mg of Flecainide and within 30 min she was better but feels drained.  Just wanted to report.  I let her know I would discuss with Dr Lovena Le tomorrow and call her back with any recommendations

## 2015-11-22 DIAGNOSIS — D225 Melanocytic nevi of trunk: Secondary | ICD-10-CM | POA: Diagnosis not present

## 2015-11-22 DIAGNOSIS — L820 Inflamed seborrheic keratosis: Secondary | ICD-10-CM | POA: Diagnosis not present

## 2015-11-22 DIAGNOSIS — D2239 Melanocytic nevi of other parts of face: Secondary | ICD-10-CM | POA: Diagnosis not present

## 2015-11-22 DIAGNOSIS — D1801 Hemangioma of skin and subcutaneous tissue: Secondary | ICD-10-CM | POA: Diagnosis not present

## 2015-11-22 DIAGNOSIS — L821 Other seborrheic keratosis: Secondary | ICD-10-CM | POA: Diagnosis not present

## 2015-11-22 MED ORDER — FLECAINIDE ACETATE 100 MG PO TABS: ORAL_TABLET | ORAL | Status: DC

## 2015-11-22 NOTE — Telephone Encounter (Signed)
Discussed with Dr Lovena Le, okay to take an extra 50 mg of Flecainide for symptoms.  When I called patient back she stated she had another episode the next morning.  Dr Lovena Le advised her to increase her Flecainide to 150mg  in the morning and 100mg  in the pm.  If she continues to have symptoms will need to wear a 48 hour monitor.  Patient is aware and will call back if she does not see improvement

## 2015-11-25 ENCOUNTER — Telehealth: Payer: Self-pay | Admitting: Internal Medicine

## 2015-11-25 DIAGNOSIS — I472 Ventricular tachycardia: Secondary | ICD-10-CM

## 2015-11-25 DIAGNOSIS — I4729 Other ventricular tachycardia: Secondary | ICD-10-CM

## 2015-11-25 DIAGNOSIS — I493 Ventricular premature depolarization: Secondary | ICD-10-CM

## 2015-11-25 NOTE — Telephone Encounter (Signed)
New Message  Pt c/o medication issue: 1. Name of Medication: Flecinide  4. What is your medication issue? Pt states that she is still having the arrhythmia since her medication change. Request a call back to discuss.

## 2015-11-26 NOTE — Telephone Encounter (Signed)
I see where she is scheduled to see Dr Lovena Le tomorrow.

## 2015-11-26 NOTE — Telephone Encounter (Signed)
Discussed with Dr Lovena Le, he would like for her to wear a 48 hour holter and then follow up with him after to discuss treatment plan

## 2015-11-27 ENCOUNTER — Ambulatory Visit (INDEPENDENT_AMBULATORY_CARE_PROVIDER_SITE_OTHER): Payer: PPO | Admitting: Internal Medicine

## 2015-11-27 ENCOUNTER — Encounter: Payer: Self-pay | Admitting: Internal Medicine

## 2015-11-27 VITALS — BP 128/70 | HR 70 | Ht 65.0 in | Wt 144.8 lb

## 2015-11-27 DIAGNOSIS — I493 Ventricular premature depolarization: Secondary | ICD-10-CM

## 2015-11-27 NOTE — Progress Notes (Signed)
HPI Patricia Alvarado returns today for followup. She is a very pleasant 70 year old woman with a history of nonsustained ventricular tachycardia and frequent PVCs, hypertension, who underwent EP study years ago and had no PVC's to ablate. In the interim, she has had occasional palpitations which had been fairly well-controlled on flecainide therapy. She takes her flecainide twice a day. She notes that her palpitations have worsened. No syncope. No CHF symptoms and no chest pain. Allergies  Allergen Reactions  . Amoxicillin     REACTION: rash and itching  . Atenolol     REACTION: causes hives  . Atorvastatin     REACTION: muscles pain  . Nadolol     REACTION: causes hives  . Quinidine     REACTION: increased heart rate     Current Outpatient Prescriptions  Medication Sig Dispense Refill  . aspirin 81 MG EC tablet Take 81 mg by mouth daily.      . B Complex-C (SUPER B COMPLEX PO) Take 1 capsule by mouth at bedtime.    . Biotin 5 MG CAPS Take 1 capsule by mouth at bedtime.    . cetirizine (ZYRTEC) 10 MG tablet Take 10 mg by mouth daily as needed for allergies.     . Coenzyme Q10 (COQ10) 100 MG CAPS Take 1 capsule by mouth daily.      Marland Kitchen escitalopram (LEXAPRO) 20 MG tablet Take 20 mg by mouth daily.      Marland Kitchen FIBER PO Take 1 capsule by mouth daily as needed (fiber).     . flecainide (TAMBOCOR) 100 MG tablet Take 1 1/2 tablets by mouth every morning and 1 tablet by mouth every evening 225 tablet 2  . ibuprofen (ADVIL,MOTRIN) 200 MG tablet Take 200 mg by mouth daily as needed (pain).     Marland Kitchen MAGNESIUM PO TRIPLE MAGNESIUM 400 mg - Take 1 capsule by mouth at night    . MANGANESE PO Take 40 mg by mouth at bedtime.    . Melatonin 10 MG TABS Take 1 capsule by mouth at bedtime.    . mometasone (NASONEX) 50 MCG/ACT nasal spray Place 2 sprays into the nose daily as needed (allergies).     . naproxen sodium (ANAPROX) 220 MG tablet Take 440 mg by mouth daily as needed (pain).     Marland Kitchen OVER THE COUNTER  MEDICATION Take 1 capsule by mouth daily. Niacin OTC    . PRESCRIPTION MEDICATION NATURE THYROID Take 1 tablet by mouth daily     No current facility-administered medications for this visit.     Past Medical History  Diagnosis Date  . Dyslipidemia   . Ventricular tachycardia (HCC)     Hx of, controlled on sotalol therapy  . Depression   . Fibromyalgia   . Fibrocystic breast   . Carcinoma of thyroid gland ([place])   . Basal cell carcinoma   . Dyslipidemia   . H/O: hysterectomy     ROS:   All systems reviewed and negative except as noted in the HPI.   Past Surgical History  Procedure Laterality Date  . Cholecystectomy    . Carpal tunnel release  2006    bilateral  . Cervical discectomy  2001  . Thyroidectomy  2011  . Ventricular ablation surgery  2011    ventricular tchycardia     Family History  Problem Relation Age of Onset  . Cancer Brother     Skin  . Other Brother     AGENT ORANGE and AntiLupus  .  Heart disease Brother     Stents and bypass x2  . Arrhythmia Brother     AFIB  . Heart attack Brother   . Hypertension Brother   . Thyroid cancer Neg Hx   . Breast cancer Other   . Hyperlipidemia Mother 11  . Osteoporosis Mother   . Heart disease Father 60  . Other Father     Cardiac arrest     Social History   Social History  . Marital Status: Married    Spouse Name: N/A  . Number of Children: N/A  . Years of Education: N/A   Occupational History  . [employer]   Social History Main Topics  . Smoking status: Never Smoker   . Smokeless tobacco: Never Used  . Alcohol Use: No  . Drug Use: No  . Sexual Activity: Not on file   Other Topics Concern  . Not on file   Social History Narrative   Married     BP 128/70 mmHg  Pulse 70  Ht 5\' 5"  (1.651 m)  Wt 144 lb 12.8 oz (65.681 kg)  BMI 24.10 kg/m2  Physical Exam:  Well appearing 70 year old woman, NAD HEENT: Unremarkable Neck:  7 cm JVD, no thyromegally Back:  No CVA  tenderness Lungs:  Clear with no wheezes, rales, or rhonchi. HEART:  Regular rate rhythm, no murmurs, no rubs, no clicks Abd:  soft, positive bowel sounds, no organomegally, no rebound, no guarding Ext:  2 plus pulses, no edema, no cyanosis, no clubbing Skin:  No rashes no nodules Neuro:  CN II through XII intact, motor grossly intact  EKG - normal sinus rhythm with rare PVCs  Assess/Plan: 1. Palpitations - her symptoms have worsened. We cannot go up on the flecainide. I have asked her to obtain a 48 hour holter to assess the quantity of her PVC's and to assess whether or not they are monomorphic or not. 2. PVC's - she has become refractory to medical therapy. We will obtain a cardiac monitor and consider switching to another AA drug pending the results of her monitor.

## 2015-11-27 NOTE — Patient Instructions (Signed)
Medication Instructions:  Your physician recommends that you continue on your current medications as directed. Please refer to the Current Medication list given to you today.   Labwork: None ordered   Testing/Procedures: Your physician has recommended that you wear a holter monitor. Holter monitors are medical devices that record the heart's electrical activity. Doctors most often use these monitors to diagnose arrhythmias. Arrhythmias are problems with the speed or rhythm of the heartbeat. The monitor is a small, portable device. You can wear one while you do your normal daily activities. This is usually used to diagnose what is causing palpitations/syncope (passing out).---order placed yesterday  Follow-Up:  Your physician recommends that you schedule a follow-up appointment ---will call after getting the results of monitor    Any Other Special Instructions Will Be Listed Below (If Applicable).     If you need a refill on your cardiac medications before your next appointment, please call your pharmacy.

## 2015-12-02 ENCOUNTER — Ambulatory Visit (INDEPENDENT_AMBULATORY_CARE_PROVIDER_SITE_OTHER): Payer: PPO

## 2015-12-02 DIAGNOSIS — I472 Ventricular tachycardia: Secondary | ICD-10-CM

## 2015-12-02 DIAGNOSIS — I493 Ventricular premature depolarization: Secondary | ICD-10-CM | POA: Diagnosis not present

## 2015-12-02 DIAGNOSIS — I4729 Other ventricular tachycardia: Secondary | ICD-10-CM

## 2015-12-09 ENCOUNTER — Telehealth: Payer: Self-pay | Admitting: Internal Medicine

## 2015-12-09 NOTE — Telephone Encounter (Signed)
Follow Up   Pt called for Monitor results

## 2015-12-09 NOTE — Telephone Encounter (Signed)
Spoke with patient and let her know I would have her results by Thurs when Dr Lovena Le is back in the office

## 2015-12-12 MED ORDER — PROPAFENONE HCL ER 225 MG PO CP12: 225.0000 mg | ORAL_CAPSULE | Freq: Two times a day (BID) | ORAL | Status: DC

## 2015-12-12 NOTE — Telephone Encounter (Signed)
Pt calling again about her monitor results.

## 2015-12-12 NOTE — Telephone Encounter (Signed)
Discussed with Dr Lovena Le, he would like for her to stop her Flecainide and to start Rythmol SR 225 bid on Saturday. She will need an EKG in 2 weeks and a 8 week follow up visit with Dr Lovena Le.  Patient is aware and knows Lenna Sciara will call her tomorrow with appointment times

## 2015-12-19 ENCOUNTER — Ambulatory Visit: Payer: PPO | Admitting: Internal Medicine

## 2015-12-20 ENCOUNTER — Telehealth: Payer: Self-pay | Admitting: Internal Medicine

## 2015-12-20 DIAGNOSIS — M797 Fibromyalgia: Secondary | ICD-10-CM | POA: Diagnosis not present

## 2015-12-20 DIAGNOSIS — E039 Hypothyroidism, unspecified: Secondary | ICD-10-CM | POA: Diagnosis not present

## 2015-12-20 DIAGNOSIS — R5381 Other malaise: Secondary | ICD-10-CM | POA: Diagnosis not present

## 2015-12-20 DIAGNOSIS — I4940 Unspecified premature depolarization: Secondary | ICD-10-CM | POA: Diagnosis not present

## 2015-12-20 DIAGNOSIS — E785 Hyperlipidemia, unspecified: Secondary | ICD-10-CM | POA: Diagnosis not present

## 2015-12-20 DIAGNOSIS — I10 Essential (primary) hypertension: Secondary | ICD-10-CM | POA: Diagnosis not present

## 2015-12-20 DIAGNOSIS — Z1389 Encounter for screening for other disorder: Secondary | ICD-10-CM | POA: Diagnosis not present

## 2015-12-20 DIAGNOSIS — Z6823 Body mass index (BMI) 23.0-23.9, adult: Secondary | ICD-10-CM | POA: Diagnosis not present

## 2015-12-20 DIAGNOSIS — E538 Deficiency of other specified B group vitamins: Secondary | ICD-10-CM | POA: Diagnosis not present

## 2015-12-20 NOTE — Telephone Encounter (Signed)
Returned call to patient and she says the irregularities she is  Having feel worse after starting Rythmol.  She says they woke her up last night pounding( not racing).  She is scheduled for an EKG 12/30/15 but wants me to discuss with Dr Lovena Le to see if anything else can be done to help this.  Let her know I would discuss with him on Mon and call her back

## 2015-12-20 NOTE — Telephone Encounter (Signed)
New message       Pt c/o medication issue:  1. Name of Medication: rythmol  2. How are you currently taking this medication (dosage and times per day)? 225mg  bid 3. Are you having a reaction (difficulty breathing--STAT)? no 4. What is your medication issue?  Started rythmol last Saturday----pt states she is still having her irr heartbeats.  Please advise

## 2015-12-23 NOTE — Telephone Encounter (Signed)
She will come in 3/2

## 2015-12-23 NOTE — Telephone Encounter (Signed)
Discussed with Dr Lovena Le and if the Rythmol is not helping she will need to come in for an OV to discuss other options

## 2015-12-26 ENCOUNTER — Encounter: Payer: Self-pay | Admitting: Internal Medicine

## 2015-12-26 ENCOUNTER — Ambulatory Visit (INDEPENDENT_AMBULATORY_CARE_PROVIDER_SITE_OTHER): Payer: PPO | Admitting: Internal Medicine

## 2015-12-26 VITALS — BP 116/62 | HR 69 | Ht 65.0 in | Wt 147.8 lb

## 2015-12-26 DIAGNOSIS — I493 Ventricular premature depolarization: Secondary | ICD-10-CM | POA: Diagnosis not present

## 2015-12-26 MED ORDER — PROPAFENONE HCL ER 325 MG PO CP12: 325.0000 mg | ORAL_CAPSULE | Freq: Two times a day (BID) | ORAL | Status: DC

## 2015-12-26 NOTE — Patient Instructions (Addendum)
Medication Instructions:  Your physician has recommended you make the following change in your medication:  1) Increase Rythmol to 325 mg twice daily    Labwork: None ordered   Testing/Procedures: Your physician has requested that you have an echocardiogram. Echocardiography is a painless test that uses sound waves to create images of your heart. It provides your doctor with information about the size and shape of your heart and how well your heart's chambers and valves are working. This procedure takes approximately one hour. There are no restrictions for this procedure.  Your physician has requested that you have an exercise tolerance test. For further information please visit [email or web address]. Please also follow instruction sheet, as given.      Follow-Up: Your physician recommends that you schedule a follow-up appointment in: with Dr Lovena Le week after test are done   Any Other Special Instructions Will Be Listed Below (If Applicable).     If you need a refill on your cardiac medications before your next appointment, please call your pharmacy.  ]

## 2015-12-26 NOTE — Progress Notes (Signed)
HPI [name] returns today for followup. She is a very pleasant 70 year old woman with a history of nonsustained ventricular tachycardia and frequent PVCs, hypertension, who underwent EP study years ago and had no PVC's to ablate. In the interim, she has had occasional palpitations which had been less well-controlled on flecainide therapy. She notes that her palpitations have worsened. We switched her from flecainide to Rhythmol after a 48 hour holter demonstrated that she was acutally having a preponderance of her symptoms due to PAC's. She has been on low dose rhythmol and had some improvement and we have discussed the limitation of our therapies as well as the benign nature of her symptoms. Allergies  Allergen Reactions  . Amoxicillin     REACTION: rash and itching  . Atenolol     REACTION: causes hives  . Atorvastatin     REACTION: muscles pain  . Nadolol     REACTION: causes hives  . Quinidine     REACTION: increased heart rate     Current Outpatient Prescriptions  Medication Sig Dispense Refill  . aspirin 81 MG EC tablet Take 81 mg by mouth daily.      . B Complex-C (SUPER B COMPLEX PO) Take 1 capsule by mouth at bedtime.    . Biotin 5 MG CAPS Take 1 capsule by mouth at bedtime.    . cetirizine (ZYRTEC) 10 MG tablet Take 10 mg by mouth daily as needed for allergies.     . Coenzyme Q10 (COQ10) 100 MG CAPS Take 1 capsule by mouth daily.      Marland Kitchen escitalopram (LEXAPRO) 20 MG tablet Take 20 mg by mouth daily.      Marland Kitchen FIBER PO Take 1 capsule by mouth daily as needed (fiber).     Marland Kitchen ibuprofen (ADVIL,MOTRIN) 200 MG tablet Take 200 mg by mouth daily as needed (pain).     Marland Kitchen MAGNESIUM PO TRIPLE MAGNESIUM 400 mg - Take 1 capsule by mouth at night    . MANGANESE PO Take 40 mg by mouth at bedtime.    . Melatonin 10 MG TABS Take 1 capsule by mouth at bedtime.    . mometasone (NASONEX) 50 MCG/ACT nasal spray Place 2 sprays into the nose daily as needed (allergies).     . naproxen sodium  (ANAPROX) 220 MG tablet Take 440 mg by mouth daily as needed (pain).     Marland Kitchen OVER THE COUNTER MEDICATION Take 1,000 mg by mouth daily. Niacin OTC    . PRESCRIPTION MEDICATION Take 113.75 mg by mouth daily. NATURE THROID Take 1 tablet (113.75 mg) by mouth daily.    . propafenone (RYTHMOL SR) 325 MG 12 hr capsule Take 1 capsule (325 mg total) by mouth 2 (two) times daily. 60 capsule 3  . VITAMIN D, CHOLECALCIFEROL, PO Take 5,000 Units by mouth daily.     No current facility-administered medications for this visit.     Past Medical History  Diagnosis Date  . Dyslipidemia   . Ventricular tachycardia (HCC)     Hx of, controlled on sotalol therapy  . Depression   . Fibromyalgia   . Fibrocystic breast   . Carcinoma of thyroid gland ([place])   . Basal cell carcinoma   . Dyslipidemia   . H/O: hysterectomy     ROS:   All systems reviewed and negative except as noted in the HPI.   Past Surgical History  Procedure Laterality Date  . Cholecystectomy    . Carpal tunnel release  2006  bilateral  . Cervical discectomy  2001  . Thyroidectomy  2011  . Ventricular ablation surgery  2011    ventricular tchycardia     Family History  Problem Relation Age of Onset  . Cancer Brother     Skin  . Other Brother     AGENT ORANGE and AntiLupus  . Heart disease Brother     Stents and bypass x2  . Arrhythmia Brother     AFIB  . Heart attack Brother   . Hypertension Brother   . Thyroid cancer Neg Hx   . Breast cancer Other   . Hyperlipidemia Mother 66  . Osteoporosis Mother   . Heart disease Father 61  . Other Father     Cardiac arrest     Social History   Social History  . Marital Status: Married    Spouse Name: N/A  . Number of Children: N/A  . Years of Education: N/A   Occupational History  . [employer]   Social History Main Topics  . Smoking status: Never Smoker   . Smokeless tobacco: Never Used  . Alcohol Use: No  . Drug Use: No  . Sexual Activity: Not on  file   Other Topics Concern  . Not on file   Social History Narrative   Married     BP 116/62 mmHg  Pulse 69  Ht 5\' 5"  (1.651 m)  Wt 147 lb 12.8 oz (67.042 kg)  BMI 24.60 kg/m2  Physical Exam:  Well appearing 70 year old woman, NAD HEENT: Unremarkable Neck:  7 cm JVD, no thyromegally Back:  No CVA tenderness Lungs:  Clear with no wheezes, rales, or rhonchi. HEART:  Regular rate rhythm, no murmurs, no rubs, no clicks Abd:  soft, positive bowel sounds, no organomegally, no rebound, no guarding Ext:  2 plus pulses, no edema, no cyanosis, no clubbing Skin:  No rashes no nodules Neuro:  CN II through XII intact, motor grossly intact  EKG - normal sinus rhythm  Assess/Plan: 1. Palpitations - after starting rhythmol, she still has symptoms but they are improved a bit. I will ask her to uptitrate the rhythmol to 325 bid. We will schedule her to undergo an exercise test to rule out proarryhthmia and a 2D echo as she has not had one for over 5 years and does have some chest pressure and sob.

## 2015-12-30 ENCOUNTER — Telehealth: Payer: Self-pay | Admitting: Internal Medicine

## 2015-12-30 NOTE — Telephone Encounter (Signed)
Spoke with patient and she says that the increased dose of Rythmol is causing "mouth soreness" and a metallic taste in her mouth.  She has also read about the medication and is terrified after reading.  I told that unfortunately I didn't know what other options there were for her but that I would discuss with Dr Lovena Le on Thursday and call her back.  She is going to go back to the bid dosing for now until I can discuss with Dr Lovena Le.

## 2015-12-30 NOTE — Telephone Encounter (Signed)
Please call,question about her Rythmol.

## 2016-01-02 NOTE — Telephone Encounter (Signed)
Discussed with Dr Lovena Le and her only other option is Amiodarone 200mg  daily

## 2016-01-07 NOTE — Telephone Encounter (Signed)
Left message for patient to call me back. 

## 2016-01-07 NOTE — Telephone Encounter (Signed)
Patient returned my call and she is doing ok on the lower dose.  She dose not need the GXT as dose is unchanged per Dr Lovena Le.  Will keep echo apt and follow up visit as scheduled

## 2016-01-09 ENCOUNTER — Telehealth: Payer: Self-pay | Admitting: Cardiovascular Disease

## 2016-01-09 ENCOUNTER — Ambulatory Visit (HOSPITAL_COMMUNITY): Payer: PPO | Attending: Interventional Cardiology

## 2016-01-09 ENCOUNTER — Other Ambulatory Visit: Payer: Self-pay

## 2016-01-09 DIAGNOSIS — I351 Nonrheumatic aortic (valve) insufficiency: Secondary | ICD-10-CM | POA: Diagnosis not present

## 2016-01-09 DIAGNOSIS — I340 Nonrheumatic mitral (valve) insufficiency: Secondary | ICD-10-CM | POA: Diagnosis not present

## 2016-01-09 DIAGNOSIS — E785 Hyperlipidemia, unspecified: Secondary | ICD-10-CM | POA: Insufficient documentation

## 2016-01-09 DIAGNOSIS — R002 Palpitations: Secondary | ICD-10-CM | POA: Diagnosis not present

## 2016-01-09 DIAGNOSIS — I493 Ventricular premature depolarization: Secondary | ICD-10-CM | POA: Diagnosis not present

## 2016-01-09 NOTE — Telephone Encounter (Signed)
Please call, this is concerning a change in her medicine.Please call before she have her echo today at 1.

## 2016-01-09 NOTE — Telephone Encounter (Signed)
Called and left message on her voicemail to return my call

## 2016-01-09 NOTE — Telephone Encounter (Signed)
Patient returned call to me.  She is here for her echo and says she is willing to try Amiodarone.  I have let her know I will try to get Dr Lovena Le but that he is in cases and it could be later before I am able to give her an answer.  She is okay with this.

## 2016-01-09 NOTE — Telephone Encounter (Signed)
Spoke to patient Patient is not a patient of Dr Claiborne Billings  Patient would like to speak to [place] will forward to [facility] RN

## 2016-01-13 ENCOUNTER — Encounter: Payer: Self-pay | Admitting: *Deleted

## 2016-01-13 ENCOUNTER — Encounter: Payer: Self-pay | Admitting: Internal Medicine

## 2016-01-13 MED ORDER — AMIODARONE HCL 200 MG PO TABS: 200.0000 mg | ORAL_TABLET | Freq: Two times a day (BID) | ORAL | Status: DC

## 2016-01-13 NOTE — Telephone Encounter (Signed)
This encounter was created in error - please disregard.

## 2016-01-13 NOTE — Telephone Encounter (Signed)
Discussed with Dr Lovena Le, will stop Propafenone and in 3 days start Amiodarone 200 mg bid and follow up on 03/03/16.  Patient aware of appointment time on 03/03/16

## 2016-01-13 NOTE — Telephone Encounter (Signed)
New Message  Pt called still waiting for a response to discuss medications and ECHO results.

## 2016-01-13 NOTE — Telephone Encounter (Signed)
Call Documentation      Santo Held at 01/13/2016 10:55 AM     Status: Signed       Expand All Collapse All   New Message  Pt called still waiting for a response to discuss medications and ECHO results.

## 2016-01-14 DIAGNOSIS — Z79899 Other long term (current) drug therapy: Secondary | ICD-10-CM | POA: Diagnosis not present

## 2016-01-14 DIAGNOSIS — Z6824 Body mass index (BMI) 24.0-24.9, adult: Secondary | ICD-10-CM | POA: Diagnosis not present

## 2016-01-14 DIAGNOSIS — I4940 Unspecified premature depolarization: Secondary | ICD-10-CM | POA: Diagnosis not present

## 2016-01-16 ENCOUNTER — Ambulatory Visit: Payer: PPO | Admitting: Internal Medicine

## 2016-01-28 ENCOUNTER — Ambulatory Visit: Payer: PPO | Admitting: Internal Medicine

## 2016-01-29 DIAGNOSIS — E039 Hypothyroidism, unspecified: Secondary | ICD-10-CM | POA: Diagnosis not present

## 2016-01-30 DIAGNOSIS — Z1389 Encounter for screening for other disorder: Secondary | ICD-10-CM | POA: Diagnosis not present

## 2016-01-30 DIAGNOSIS — E039 Hypothyroidism, unspecified: Secondary | ICD-10-CM | POA: Diagnosis not present

## 2016-01-30 DIAGNOSIS — Z6824 Body mass index (BMI) 24.0-24.9, adult: Secondary | ICD-10-CM | POA: Diagnosis not present

## 2016-02-06 ENCOUNTER — Ambulatory Visit: Payer: PPO | Admitting: Internal Medicine

## 2016-02-12 DIAGNOSIS — Z79899 Other long term (current) drug therapy: Secondary | ICD-10-CM | POA: Diagnosis not present

## 2016-02-18 ENCOUNTER — Telehealth: Payer: Self-pay | Admitting: Internal Medicine

## 2016-02-18 NOTE — Telephone Encounter (Signed)
New Message:  Pt called in wanting to inform the nurse that she stopped taking the Amiodarone on 4/18. Please f/u with her

## 2016-02-19 NOTE — Telephone Encounter (Signed)
Returned call to patient.  She stayed on the Amiodarone for 4 weeks and then stopped.  She was still having an irregular heartbeat and as she did not want to start it in the first place she felt she wanted to stop.  She is doing okay now and will call back if she feels she needs to be seen.  Will cancel follow up for 5/9

## 2016-03-03 ENCOUNTER — Ambulatory Visit: Payer: PPO | Admitting: Internal Medicine

## 2016-04-13 ENCOUNTER — Telehealth: Payer: Self-pay | Admitting: Internal Medicine

## 2016-04-13 DIAGNOSIS — I493 Ventricular premature depolarization: Secondary | ICD-10-CM

## 2016-04-13 NOTE — Telephone Encounter (Signed)
Off all medications, last Mon and yesterday had episode where she felt like she was going to pass out.  It felt irregular.  Both times she was sitting on sofa.  When she exercises she feels better.  Wants to know if still an option to go in to hospital to be started of Betapace.  Each episode lasted 5 sec or so.   Betapace--worked Amiodarone and Rhythmol did not control these Flecainide worked for 6 years then stopped working

## 2016-04-13 NOTE — Telephone Encounter (Signed)
New message    Patient calling C/O x 2 near syncope episodes in last past week - just sitting.  - just tired today . H/O fibromyalgia    No chest pain   No sob

## 2016-04-15 NOTE — Telephone Encounter (Signed)
Discussed with Dr Lovena Le and he would like for the patient to obtain another 30 day monitor to assess her rhythm and rates.  He asked that she keep diary with monitor when she does this and will need a follow up after monitor.  Patient aware

## 2016-04-17 ENCOUNTER — Ambulatory Visit (INDEPENDENT_AMBULATORY_CARE_PROVIDER_SITE_OTHER): Payer: PPO

## 2016-04-17 DIAGNOSIS — I493 Ventricular premature depolarization: Secondary | ICD-10-CM

## 2016-04-17 DIAGNOSIS — R55 Syncope and collapse: Secondary | ICD-10-CM | POA: Diagnosis not present

## 2016-04-21 ENCOUNTER — Telehealth: Payer: Self-pay | Admitting: Internal Medicine

## 2016-04-21 ENCOUNTER — Other Ambulatory Visit: Payer: Self-pay | Admitting: Internal Medicine

## 2016-04-21 DIAGNOSIS — I493 Ventricular premature depolarization: Secondary | ICD-10-CM

## 2016-04-21 DIAGNOSIS — R55 Syncope and collapse: Secondary | ICD-10-CM

## 2016-04-21 NOTE — Telephone Encounter (Signed)
New Message  Rep from [facility] calling to discuss DX for pt. Please call back and discuss.

## 2016-05-13 ENCOUNTER — Telehealth: Payer: Self-pay | Admitting: Internal Medicine

## 2016-05-13 NOTE — Telephone Encounter (Signed)
[name] is calling because she has a monitor and her 30th day will be on Saturday and she will be going out of town. Wants to know can she stop it on Friday and get the monitor back in the mail that Friday afternoon . Please call    Thanks

## 2016-05-13 NOTE — Telephone Encounter (Signed)
That will be okay to return on Fri.  She is aware

## 2016-05-21 ENCOUNTER — Ambulatory Visit (INDEPENDENT_AMBULATORY_CARE_PROVIDER_SITE_OTHER): Payer: PPO | Admitting: Internal Medicine

## 2016-05-21 ENCOUNTER — Encounter: Payer: Self-pay | Admitting: Internal Medicine

## 2016-05-21 VITALS — BP 114/68 | HR 75 | Ht 65.0 in | Wt 146.6 lb

## 2016-05-21 DIAGNOSIS — I480 Paroxysmal atrial fibrillation: Secondary | ICD-10-CM

## 2016-05-21 DIAGNOSIS — I472 Ventricular tachycardia: Secondary | ICD-10-CM

## 2016-05-21 DIAGNOSIS — I4729 Other ventricular tachycardia: Secondary | ICD-10-CM

## 2016-05-21 NOTE — Progress Notes (Signed)
HPI Patricia Alvarado returns today for followup. She is a very pleasant 70 year old woman with a history of nonsustained ventricular tachycardia and frequent PVCs, hypertension, who underwent EP study years ago and had no PVC's to ablate. She has taken multiple different AA drugs in the past including amiodarone, sotalol, rhythmol, flecainide, quinidine but has continued to have symptoms. The patient has worn a cardiac monitor which demonstrated NSVT, PVC's with multiple morphologies, and suprisingly episodes of atrial fibrillation with an IRIR response. She has had worsening palpitations. She has never had documented atrial fib. She took amiodarone for 3 weeks most recently but stopped this medication. She is highly symptomatic. It is unclear which arrhythmia is causing the majority of her symptoms. Allergies  Allergen Reactions  . Amoxicillin     REACTION: rash and itching  . Atenolol     REACTION: causes hives  . Atorvastatin     REACTION: muscles pain  . Nadolol     REACTION: causes hives  . Quinidine     REACTION: increased heart rate     Current Outpatient Prescriptions  Medication Sig Dispense Refill  . aspirin 81 MG EC tablet Take 81 mg by mouth daily.      . B Complex-C (SUPER B COMPLEX PO) Take 1 capsule by mouth at bedtime.    . Biotin 5 MG CAPS Take 1 capsule by mouth at bedtime.    . cetirizine (ZYRTEC) 10 MG tablet Take 10 mg by mouth daily as needed for allergies.     . Coenzyme Q10 (COQ10) 100 MG CAPS Take 1 capsule by mouth daily.      Marland Kitchen escitalopram (LEXAPRO) 20 MG tablet Take 20 mg by mouth daily.      Marland Kitchen FIBER PO Take 1 capsule by mouth daily as needed (fiber).     Marland Kitchen ibuprofen (ADVIL,MOTRIN) 200 MG tablet Take 200 mg by mouth daily as needed (pain).     Marland Kitchen MAGNESIUM PO TRIPLE MAGNESIUM 400 mg - Take 1 capsule by mouth at night    . MANGANESE PO Take 40 mg by mouth at bedtime.    . Melatonin 10 MG TABS Take 1 capsule by mouth at bedtime.    . mometasone (NASONEX) 50  MCG/ACT nasal spray Place 2 sprays into the nose daily as needed (allergies).     . naproxen sodium (ANAPROX) 220 MG tablet Take 440 mg by mouth daily as needed (pain).     Marland Kitchen OVER THE COUNTER MEDICATION Take 1,000 mg by mouth daily. Niacin OTC    . PRESCRIPTION MEDICATION Take 113.75 mg by mouth daily. NATURE THROID Take 1 tablet (113.75 mg) by mouth daily.    Marland Kitchen VITAMIN D, CHOLECALCIFEROL, PO Take 5,000 Units by mouth daily.     No current facility-administered medications for this visit.      Past Medical History:  Diagnosis Date  . Basal cell carcinoma   . Carcinoma of thyroid gland ([place])   . Depression   . Dyslipidemia   . Dyslipidemia   . Fibrocystic breast   . Fibromyalgia   . H/O: hysterectomy   . Ventricular tachycardia (HCC)    Hx of, controlled on sotalol therapy    ROS:   All systems reviewed and negative except as noted in the HPI.   Past Surgical History:  Procedure Laterality Date  . CARPAL TUNNEL RELEASE  2006   bilateral  . CERVICAL DISCECTOMY  2001  . CHOLECYSTECTOMY    . THYROIDECTOMY  2011  . VENTRICULAR ABLATION  SURGERY  2011   ventricular tchycardia     Family History  Problem Relation Age of Onset  . Cancer Brother     Skin  . Other Brother     AGENT ORANGE and AntiLupus  . Heart disease Brother     Stents and bypass x2  . Arrhythmia Brother     AFIB  . Heart attack Brother   . Hypertension Brother   . Thyroid cancer Neg Hx   . Breast cancer Other   . Hyperlipidemia Mother 64  . Osteoporosis Mother   . Heart disease Father 110  . Other Father     Cardiac arrest     Social History   Social History  . Marital status: Married    Spouse name: N/A  . Number of children: N/A  . Years of education: N/A   Occupational History  . [employer]   Social History Main Topics  . Smoking status: Never Smoker  . Smokeless tobacco: Never Used  . Alcohol use No  . Drug use: No  . Sexual activity: Not on file   Other Topics  Concern  . Not on file   Social History Narrative   Married     BP 114/68 (BP Location: Left Arm)   Pulse 75   Ht 5\' 5"  (1.651 m)   Wt 146 lb 9.6 oz (66.5 kg)   SpO2 99%   BMI 24.40 kg/m   Physical Exam:  Well appearing 71 year old woman, NAD HEENT: Unremarkable Neck:  7 cm JVD, no thyromegally Back:  No CVA tenderness Lungs:  Clear with no wheezes, rales, or rhonchi. HEART:  IRegular rate rhythm, no murmurs, no rubs, no clicks Abd:  soft, positive bowel sounds, no organomegally, no rebound, no guarding Ext:  2 plus pulses, no edema, no cyanosis, no clubbing Skin:  No rashes no nodules Neuro:  CN II through XII intact, motor grossly intact  Cardiac monitor - reviewed and as above.  Assess/Plan: 1. Palpitations - the patient is highly symptomatic. We are running out of AA drug therapy options. Because she has been found to have atrial fib, I will admit her for Tikosyn. Her QTC today is 420. It is my hope that the Tikosyn will control both atrial fib and her ventricular ectopy.  2. VT - she has had no sustained episodes. See plan as above. 3. HTN - her blood pressure is controlled. Will follow. 4. PAF - this is a new finding. Hopefully she can be controlled. Once we have started her Tikosyn, I would anticipating initiation Eliquis and stopping ASA.  Mikle [email or web address].D.

## 2016-05-21 NOTE — Patient Instructions (Signed)
Medication Instructions:  Your physician recommends that you continue on your current medications as directed. Please refer to the Current Medication list given to you today.   Labwork: None ordered   Testing/Procedures: Tikosyn load next week---one of our pharmacist will call you in regards to admission  Follow-Up:  Your physician recommends that you schedule a follow-up appointment 5 weeks with Dr Lovena Le   Any Other Special Instructions Will Be Listed Below (If Applicable).     If you need a refill on your cardiac medications before your next appointment, please call your pharmacy.

## 2016-05-22 ENCOUNTER — Telehealth: Payer: Self-pay | Admitting: Internal Medicine

## 2016-05-22 NOTE — Telephone Encounter (Signed)
Spoke with pt.  She will come to the office for [place] admission on 8/1.  Per Dr. Lovena Le, if she is in sinus rhythm then she can start Tikosyn and Eliquis at the same time.  If not, will need 3 weeks of anticoagulation prior to starting Tikosyn.

## 2016-05-22 NOTE — Telephone Encounter (Signed)
She is going to have Tikosyn load

## 2016-05-22 NOTE — Telephone Encounter (Signed)
New message        The pt wants to know when to come in for the blood for her medication she needs to come in for the pt states she is going to be admitted for the medication.   The pt is coming in for a testing procedures next week and needs the blood work ordered.

## 2016-05-26 ENCOUNTER — Inpatient Hospital Stay (HOSPITAL_COMMUNITY)
Admission: AD | Admit: 2016-05-26 | Discharge: 2016-05-29 | DRG: 310 | Disposition: A | Discharge status: Home / Self Care | Payer: PPO | Source: Ambulatory Visit | Attending: Cardiology | Admitting: Cardiology

## 2016-05-26 ENCOUNTER — Ambulatory Visit (INDEPENDENT_AMBULATORY_CARE_PROVIDER_SITE_OTHER): Payer: PPO | Admitting: Pharmacist

## 2016-05-26 ENCOUNTER — Encounter (HOSPITAL_COMMUNITY): Payer: Self-pay | Admitting: General Practice

## 2016-05-26 DIAGNOSIS — E039 Hypothyroidism, unspecified: Secondary | ICD-10-CM | POA: Diagnosis present

## 2016-05-26 DIAGNOSIS — Z808 Family history of malignant neoplasm of other organs or systems: Secondary | ICD-10-CM | POA: Diagnosis not present

## 2016-05-26 DIAGNOSIS — Z91040 Latex allergy status: Secondary | ICD-10-CM

## 2016-05-26 DIAGNOSIS — Z85850 Personal history of malignant neoplasm of thyroid: Secondary | ICD-10-CM

## 2016-05-26 DIAGNOSIS — I493 Ventricular premature depolarization: Secondary | ICD-10-CM | POA: Diagnosis present

## 2016-05-26 DIAGNOSIS — Z79899 Other long term (current) drug therapy: Secondary | ICD-10-CM

## 2016-05-26 DIAGNOSIS — I10 Essential (primary) hypertension: Secondary | ICD-10-CM | POA: Diagnosis not present

## 2016-05-26 DIAGNOSIS — Z888 Allergy status to other drugs, medicaments and biological substances status: Secondary | ICD-10-CM | POA: Diagnosis not present

## 2016-05-26 DIAGNOSIS — I480 Paroxysmal atrial fibrillation: Secondary | ICD-10-CM | POA: Diagnosis present

## 2016-05-26 DIAGNOSIS — Z7982 Long term (current) use of aspirin: Secondary | ICD-10-CM | POA: Diagnosis not present

## 2016-05-26 DIAGNOSIS — Z85828 Personal history of other malignant neoplasm of skin: Secondary | ICD-10-CM | POA: Diagnosis not present

## 2016-05-26 DIAGNOSIS — Z8249 Family history of ischemic heart disease and other diseases of the circulatory system: Secondary | ICD-10-CM | POA: Diagnosis not present

## 2016-05-26 DIAGNOSIS — I472 Ventricular tachycardia, unspecified: Secondary | ICD-10-CM

## 2016-05-26 DIAGNOSIS — I4891 Unspecified atrial fibrillation: Secondary | ICD-10-CM | POA: Insufficient documentation

## 2016-05-26 DIAGNOSIS — R002 Palpitations: Secondary | ICD-10-CM | POA: Diagnosis not present

## 2016-05-26 DIAGNOSIS — I4729 Other ventricular tachycardia: Secondary | ICD-10-CM

## 2016-05-26 HISTORY — DX: Cardiac murmur, unspecified: R01.1

## 2016-05-26 HISTORY — DX: Migraine, unspecified, not intractable, without status migrainosus: G43.909

## 2016-05-26 HISTORY — DX: Malignant melanoma of left lower limb, including hip: C43.72

## 2016-05-26 HISTORY — DX: Nonrheumatic mitral (valve) prolapse: I34.1

## 2016-05-26 HISTORY — DX: Low back pain: M54.5

## 2016-05-26 HISTORY — DX: Hypothyroidism, unspecified: E03.9

## 2016-05-26 HISTORY — DX: Low back pain, unspecified: M54.50

## 2016-05-26 HISTORY — DX: Personal history of other medical treatment: Z92.89

## 2016-05-26 HISTORY — DX: Other chronic pain: G89.29

## 2016-05-26 LAB — BASIC METABOLIC PANEL
Anion gap: 6 (ref 5–15)
BUN: 12 mg/dL (ref 6–20)
CALCIUM: 10 mg/dL (ref 8.9–10.3)
CO2: 28 mmol/L (ref 22–32)
CREATININE: 0.74 mg/dL (ref 0.44–1.00)
Chloride: 106 mmol/L (ref 101–111)
GFR calc Af Amer: >60 mL/min (ref >60)
GLUCOSE: 92 mg/dL (ref 65–99)
POTASSIUM: 5.1 mmol/L (ref 3.5–5.1)
SODIUM: 140 mmol/L (ref 135–145)

## 2016-05-26 LAB — CBC WITH DIFFERENTIAL/PLATELET
Basophils Absolute: 0 10*3/uL (ref 0.0–0.1)
Basophils Relative: 1 %
EOS ABS: 0.2 10*3/uL (ref 0.0–0.7)
EOS PCT: 3 %
HCT: 41.2 % (ref 36.0–46.0)
Hemoglobin: 13.3 g/dL (ref 12.0–15.0)
LYMPHS ABS: 1.7 10*3/uL (ref 0.7–4.0)
Lymphocytes Relative: 35 %
MCH: 31.1 pg (ref 26.0–34.0)
MCHC: 32.3 g/dL (ref 30.0–36.0)
MCV: 96.3 fL (ref 78.0–100.0)
Monocytes Absolute: 0.3 10*3/uL (ref 0.1–1.0)
Monocytes Relative: 6 %
Neutro Abs: 2.7 10*3/uL (ref 1.7–7.7)
Neutrophils Relative %: 55 %
PLATELETS: 194 10*3/uL (ref 150–400)
RBC: 4.28 MIL/uL (ref 3.87–5.11)
RDW: 13 % (ref 11.5–15.5)
WBC: 4.9 10*3/uL (ref 4.0–10.5)

## 2016-05-26 LAB — MAGNESIUM: MAGNESIUM: 2 mg/dL (ref 1.7–2.4)

## 2016-05-26 LAB — TSH: TSH: 0.13 u[IU]/mL — ABNORMAL LOW (ref 0.350–4.500)

## 2016-05-26 MED ORDER — FLUTICASONE PROPIONATE 50 MCG/ACT NA SUSP
1.0000 | Freq: Every day | NASAL | Status: DC | PRN
  Filled 2016-05-26: qty 16

## 2016-05-26 MED ORDER — SODIUM CHLORIDE 0.9% FLUSH: 3.0000 mL | INTRAVENOUS | Status: DC | PRN

## 2016-05-26 MED ORDER — THYROID 113.75 MG PO TABS
113.7500 mg | ORAL_TABLET | Freq: Every day | ORAL | Status: DC
  Administered 2016-05-27 – 2016-05-29 (×3): 113.75 mg via ORAL
  Filled 2016-05-26 (×3): qty 1

## 2016-05-26 MED ORDER — APIXABAN 5 MG PO TABS
5.0000 mg | ORAL_TABLET | Freq: Two times a day (BID) | ORAL | Status: DC
  Administered 2016-05-26 – 2016-05-29 (×6): 5 mg via ORAL
  Filled 2016-05-26 (×7): qty 1

## 2016-05-26 MED ORDER — MAGNESIUM OXIDE 400 (241.3 MG) MG PO TABS
200.0000 mg | ORAL_TABLET | Freq: Every day | ORAL | Status: DC
  Administered 2016-05-27 – 2016-05-29 (×3): 200 mg via ORAL
  Filled 2016-05-26 (×3): qty 1

## 2016-05-26 MED ORDER — MANGANESE 50 MG PO TABS: 40.0000 mg | ORAL_TABLET | Freq: Every day | ORAL | Status: DC

## 2016-05-26 MED ORDER — SODIUM CHLORIDE 0.9% FLUSH
3.0000 mL | Freq: Two times a day (BID) | INTRAVENOUS | Status: DC
  Administered 2016-05-26 – 2016-05-29 (×4): 3 mL via INTRAVENOUS

## 2016-05-26 MED ORDER — VITAMIN D3 25 MCG (1000 UNIT) PO TABS
5000.0000 [IU] | ORAL_TABLET | Freq: Every day | ORAL | Status: DC
  Administered 2016-05-27 – 2016-05-29 (×3): 5000 [IU] via ORAL
  Filled 2016-05-26 (×6): qty 5

## 2016-05-26 MED ORDER — DOFETILIDE 250 MCG PO CAPS
500.0000 ug | ORAL_CAPSULE | Freq: Two times a day (BID) | ORAL | Status: DC
Start: 2016-05-26 — End: 2016-05-27
  Administered 2016-05-26: 500 ug via ORAL
  Filled 2016-05-26 (×2): qty 2

## 2016-05-26 MED ORDER — ESCITALOPRAM OXALATE 20 MG PO TABS
20.0000 mg | ORAL_TABLET | Freq: Every day | ORAL | Status: DC
  Administered 2016-05-26 – 2016-05-27 (×2): 20 mg via ORAL
  Filled 2016-05-26 (×2): qty 1

## 2016-05-26 MED ORDER — ACETAMINOPHEN 500 MG PO TABS
500.0000 mg | ORAL_TABLET | Freq: Four times a day (QID) | ORAL | Status: DC | PRN
  Administered 2016-05-27 (×2): 1000 mg via ORAL
  Filled 2016-05-26 (×2): qty 2

## 2016-05-26 MED ORDER — COQ10 100 MG PO CAPS: 100.0000 mg | ORAL_CAPSULE | Freq: Every day | ORAL | Status: DC

## 2016-05-26 MED ORDER — SODIUM CHLORIDE 0.9 % IV SOLN: 250.0000 mL | INTRAVENOUS | Status: DC | PRN

## 2016-05-26 NOTE — H&P (Signed)
Patricia Alvarado is an 70 y.o. female.    Primary Cardiologist:Dr. Lovena Le PCP: Nicoletta Dress, MD  Chief Complaint: admitted for Tikosyn load  HPI: 70 year old woman with a history of nonsustained ventricular tachycardia and frequent PVCs, hypertension, who underwent EP study years ago and had no PVC's to ablate. She has taken multiple different AA drugs in the past including amiodarone, sotalol, rhythmol, flecainide, quinidine but has continued to have symptoms. The patient has worn a cardiac monitor which demonstrated NSVT, PVC's with multiple morphologies, and suprisingly episodes of atrial fibrillation with an IRIR response. She has had worsening palpitations. She has never had documented atrial fib. She took amiodarone for 3 weeks most recently but stopped this medication. She is highly symptomatic. It is unclear which arrhythmia is causing the majority of her symptoms.  She was seen by Dr. Lovena Le and per Dr. Lovena Le "We are running out of AA drug therapy options. Because she has been found to have atrial fib, I will admit her for Tikosyn. Her QTC today is 420. It is my hope that the Tikosyn will control both atrial fib and her ventricular ectopy "  She also has HTN.  She was seen in office today and EKG is stable for initiation  Of Tikosyn. K+ is 5.1, BUN 12, Cr 0.74.  Magnesium is 2.0 and TSH 0.130  H/H 13.3/41.2  NSR with vent rate of 69 bpm. QTc 430 msec  Here on 2 west no complaints.  Has no questions.   Past Medical History:  Diagnosis Date  . Basal cell carcinoma 2001   "forehead, between eyebrows"  . Carcinoma of thyroid gland ([place]) 2001  . Chronic lower back pain    "worse is across my hips" (05/26/2016)  . Depression   . Dyslipidemia   . Fibrocystic breast   . Fibromyalgia   . Heart murmur   . History of blood transfusion 1992   "after subcutaneous mastectomies"  . Hypothyroidism   . Malignant melanoma of left ankle ([place]) 2001  . Migraine    "visual;  2-3 times/year" (05/26/2016)  . Mitral valve prolapse   . Ventricular tachycardia (HCC)    Hx of, controlled on sotalol therapy    Past Surgical History:  Procedure Laterality Date  . ABDOMINAL HYSTERECTOMY  1998  . ANTERIOR CERVICAL DECOMP/DISCECTOMY FUSION  2001  . BACK SURGERY    . BASAL CELL CARCINOMA EXCISION  2001   "cut it out & did a flap, on forehead between my eyebrows"  . BREAST IMPLANT EXCHANGE Bilateral 2001   [id number]  . CARPAL TUNNEL RELEASE Right 2006  . COLONOSCOPY    . DILATION AND CURETTAGE OF UTERUS  1970s X 2-3  . ELECTROPHYSIOLOGIC STUDY  1994 X 2;2001   "to see it it was sustained VT; cause thyroid levels were causing arrhythmias"  . LAPAROSCOPIC CHOLECYSTECTOMY    . MASTECTOMY Bilateral 1992   "subcutaneous"  . MELANOMA EXCISION Left 2001   "ankle, stage I"  . PLACEMENT OF BREAST IMPLANTS Bilateral 1992   [id number]  . TOTAL THYROIDECTOMY  12/1999   "cancer"  . VENTRICULAR ABLATION SURGERY  2011   ventricular tchycardia    Family History  Problem Relation Age of Onset  . Cancer Brother     Skin  . Other Brother     AGENT ORANGE and AntiLupus  . Heart disease Brother     Stents and bypass x2  . Arrhythmia Brother     AFIB  .  Heart attack Brother   . Hypertension Brother   . Hyperlipidemia Mother 79  . Osteoporosis Mother   . Heart disease Father 92  . Other Father     Cardiac arrest  . Breast cancer Other   . Thyroid cancer Neg Hx    Social History:  reports that she has never smoked. She has never used smokeless tobacco. She reports that she does not drink alcohol or use drugs.  Allergies:  Allergies  Allergen Reactions  . Quinidine Other (See Comments)    REACTION: increased heart rate  . Amoxicillin Itching and Rash  . Atenolol Hives  . Atorvastatin Other (See Comments)    REACTION: muscles pain  . Latex Other (See Comments)    Raw, rash and blisters - Tape only  . Nadolol Hives    Medications Prior to Admission  Medication  Sig Dispense Refill  . acetaminophen (TYLENOL) 500 MG tablet Take 500-1,000 mg by mouth every 6 (six) hours as needed for mild pain or moderate pain.    Marland Kitchen aspirin 81 MG EC tablet Take 81 mg by mouth daily.      . B Complex-C (SUPER B COMPLEX PO) Take 1 capsule by mouth at bedtime.    . Biotin 5 MG CAPS Take 5 mg by mouth at bedtime.     . cetirizine (ZYRTEC) 10 MG tablet Take 10 mg by mouth daily as needed for allergies.     . Coenzyme Q10 (COQ10) 100 MG CAPS Take 100 mg by mouth daily.     Marland Kitchen escitalopram (LEXAPRO) 20 MG tablet Take 20 mg by mouth daily.      Marland Kitchen MAGNESIUM PO Take 1 tablet by mouth daily. Magnesium Malate    . MANGANESE PO Take 40 mg by mouth at bedtime.    . Melatonin 10 MG TABS Take 10 mg by mouth at bedtime.     . metroNIDAZOLE (METROCREAM) 0.75 % cream Apply 1 application topically at bedtime.    . mometasone (NASONEX) 50 MCG/ACT nasal spray Place 2 sprays into the nose daily as needed (allergies).     . naproxen sodium (ANAPROX) 220 MG tablet Take 220 mg by mouth as needed (for pain or headache).    . OVER THE COUNTER MEDICATION Take 1,000 mg by mouth daily. Niacin time release OTC    . PRESCRIPTION MEDICATION Take 113.75 mg by mouth daily. NATURE THROID Take 1 tablet (113.75 mg) by mouth daily.    . Thiamine Mononitrate (VITAMIN B1 PO) Take 1 tablet by mouth at bedtime.    Marland Kitchen VITAMIN D, CHOLECALCIFEROL, PO Take 5,000 Units by mouth daily.    Marland Kitchen ibuprofen (ADVIL,MOTRIN) 200 MG tablet Take 200 mg by mouth daily as needed (pain).       Results for orders placed or performed in visit on 05/26/16 (from the past 48 hour(s))  Basic Metabolic Panel (BMET)     Status: None   Collection Time: 05/26/16 10:00 AM  Result Value Ref Range   Sodium 140 135 - 145 mmol/L   Potassium 5.1 3.5 - 5.1 mmol/L   Chloride 106 101 - 111 mmol/L   CO2 28 22 - 32 mmol/L   Glucose, Bld 92 65 - 99 mg/dL   BUN 12 6 - 20 mg/dL   Creatinine, Ser 0.74 0.44 - 1.00 mg/dL   Calcium 10.0 8.9 - 10.3 mg/dL     GFR calc non Af Amer >60 >60 mL/min   GFR calc Af Amer >60 >60 mL/min  Comment: (NOTE) The eGFR has been calculated using the CKD EPI equation. This calculation has not been validated in all clinical situations. eGFR's persistently <60 mL/min signify possible Chronic Kidney Disease.    Anion gap 6 5 - 15  Magnesium     Status: None   Collection Time: 05/26/16 10:00 AM  Result Value Ref Range   Magnesium 2.0 1.7 - 2.4 mg/dL  TSH     Status: Abnormal   Collection Time: 05/26/16 10:00 AM  Result Value Ref Range   TSH 0.130 (L) 0.350 - 4.500 uIU/mL  CBC with Differential/Platelet     Status: None   Collection Time: 05/26/16 10:00 AM  Result Value Ref Range   WBC 4.9 4.0 - 10.5 K/uL   RBC 4.28 3.87 - 5.11 MIL/uL   Hemoglobin 13.3 12.0 - 15.0 g/dL   HCT 41.2 36.0 - 46.0 %   MCV 96.3 78.0 - 100.0 fL   MCH 31.1 26.0 - 34.0 pg   MCHC 32.3 30.0 - 36.0 g/dL   RDW 13.0 11.5 - 15.5 %   Platelets 194 150 - 400 K/uL   Neutrophils Relative % 55 %   Neutro Abs 2.7 1.7 - 7.7 K/uL   Lymphocytes Relative 35 %   Lymphs Abs 1.7 0.7 - 4.0 K/uL   Monocytes Relative 6 %   Monocytes Absolute 0.3 0.1 - 1.0 K/uL   Eosinophils Relative 3 %   Eosinophils Absolute 0.2 0.0 - 0.7 K/uL   Basophils Relative 1 %   Basophils Absolute 0.0 0.0 - 0.1 K/uL   No results found.  ROS: General:no colds or fevers, no weight changes Skin:no rashes or ulcers HEENT:no blurred vision, no congestion CV:see HPI PUL:see HPI GI:no diarrhea constipation or melena, no indigestion GU:no hematuria, no dysuria MS:no joint pain, no claudication Neuro:no syncope, no lightheadedness Endo:no diabetes, no thyroid disease  Blood pressure (!) 146/54, temperature 98.1 F (36.7 C), temperature source Oral. PE: General:Pleasant affect, NAD Skin:Warm and dry, brisk capillary refill HEENT:normocephalic, sclera clear, mucus membranes moist Neck:supple, no JVD, no bruits  Heart:S1S2 RRR without murmur, gallup, rub or  click Lungs:clear without rales, rhonchi, or wheezes WJX:BJYN, non tender, + BS, do not palpate liver spleen or masses Ext:no lower ext edema, 2+ pedal pulses, 2+ radial pulses Neuro:alert and oriented, MAE, follows commands, + facial symmetry    Assessment/Plan Principal Problem:   PAF (paroxysmal atrial fibrillation) ([place]), currently in SR with PVCs.    Active Problems:   VENTRICULAR TACHYCARDIA   PVC (premature ventricular contraction)   Abnormal thyroid TSH but patient states that her MD keeps her TSH low due to history of thyroid CA so will continue at current dose of thyroid suppl.  Cecilie Kicks Nurse Practitioner Certified [place] Pager [phone] or after 5pm or weekends call [phone] 05/26/2016, 5:18 PM   Patient seen and independently examined with Cecilie Kicks, NP. We discussed all aspects of the encounter. I agree with the assessment and plan as stated above.  Patient with history of PVC's, VT and now PAF.  She has failed multiple AAD therapies and is now admitted for Tikosyn load.  She has no complaints at present.  EKG QTc stable on check earlier today and potassium and Mg in normal range.  Will start Tikosyn at 599m BID and follow QTc closely.  Signed: TFransico Him MD [facility] 05/26/2016

## 2016-05-26 NOTE — Progress Notes (Signed)
    HPI Mrs. Plett is a 70 yo F patient of Dr. Lovena Le who is seen today for Tikosyn follow up.  She has a history of . nonsustained ventricular tachycardia and frequent PVCs, hypertension, who underwent EP study years ago and had no PVC's to ablate. She wore a cardiac monitor that showed NSVT, PVC's with multiple morphologies, and suprisingly episodes of atrial fibrillation with an IRIR response.  She stated she was very symptomatic at her visit with Dr. Lovena Le on 05/21/16. She has taken multiple different AA drugs in the past including amiodarone, sotalol, rhythmol, flecainide, quinidine but has continued to have symptoms.  Dr. Lovena Le discussed starting Tikosyn with patient and she was agreeable.    Reviewed pt's medication list.  She is currently not taking any contraindicated medications.  She is on Lexapro 20mg  daily, which can prolong QTc but her baseline QTc is okay today.  She is not anticoagulated and in NSR so okay to start Tikosyn per Dr. Lovena Le.  Will start Eliquis with Tikosyn today.  She has been treated with several antiarrhythmic therapies, including amiodarone most recently.  This was stopped 02/18/16 so this would be a long enough wash out period.   Discussed potential side effects of Tikosyn with patient including QTc prolongation.  Pt is aware of the importance of compliance and will call us if she misses more than 2 doses in a row.  Discussed potential medication interactions with Tikosyn as well.  EKG reviewed by Dr. Irish Lack.  NSR with vent rate of 69 bpm. QTc 430 msec.    Allergies  Allergen Reactions  . Quinidine Other (See Comments)    REACTION: increased heart rate  . Amoxicillin Itching and Rash  . Atenolol Hives  . Atorvastatin Other (See Comments)    REACTION: muscles pain  . Latex Other (See Comments)    Raw, rash and blisters - Tape only  . Nadolol Hives     No current outpatient prescriptions on file.   No current facility-administered medications for this  visit.      Past Medical History:  Diagnosis Date  . Basal cell carcinoma 2001   "forehead, between eyebrows"  . Carcinoma of thyroid gland ([place]) 2001  . Chronic lower back pain    "worse is across my hips" (05/26/2016)  . Depression   . Dyslipidemia   . Fibrocystic breast   . Fibromyalgia   . Heart murmur   . History of blood transfusion 1992   "after subcutaneous mastectomies"  . Hypothyroidism   . Malignant melanoma of left ankle ([place]) 2001  . Migraine    "visual; 2-3 times/year" (05/26/2016)  . Mitral valve prolapse   . Ventricular tachycardia (HCC)    Hx of, controlled on sotalol therapy    Assess/Plan: 1. PAF- Reviewed pt's labs.  K and Mg acceptable for starting Tikosyn.  SCr- 0.74, CrCl- 75 mL/min.  Would start at Tikosyn 571mcg BID.  Pt will also need to start Eliquis 5mg  BID.  Of note, pt requested TSH to be checked due to change in thyroid dosing and her afib symptoms.  TSH was 6.830 in February with normal T3 and T4.  TSH today was 0.130.  Thyroid medication may need to be adjusted.

## 2016-05-26 NOTE — Progress Notes (Signed)
Pharmacy Review for Dofetilide (Tikosyn) Initiation  Admit Complaint: 70 y.o. female admitted 05/26/2016 with atrial fibrillation to be initiated on dofetilide.   Assessment:  Patient Exclusion Criteria: If any screening criteria checked as "Yes", then  patient  should NOT receive dofetilide until criteria item is corrected. If "Yes" please indicate correction plan.  YES  NO Patient  Exclusion Criteria Correction Plan  []  [x]  Baseline QTc interval is greater than or equal to 440 msec. IF above YES box checked dofetilide contraindicated unless patient has ICD; then may proceed if QTc 500-550 msec or with known ventricular conduction abnormalities may proceed with QTc 550-600 msec. QTc = 0.38 (430 msec in office today)   []  [x]  Magnesium level is less than 1.8 mEq/l : Last magnesium:  Lab Results  Component Value Date   MG 2.0 05/26/2016         []  [x]  Potassium level is less than 4 mEq/l : Last potassium:  Lab Results  Component Value Date   K 5.1 05/26/2016         []  [x]  Patient is known or suspected to have a digoxin level greater than 2 ng/ml: No results found for: DIGOXIN    []  [x]  Creatinine clearance less than 20 ml/min (calculated using Cockcroft-Gault, actual body weight and serum creatinine): Estimated Creatinine Clearance: 60.6 mL/min (by C-G formula based on SCr of 0.8 mg/dL).    [x]  []  Patient has received drugs known to prolong the QT intervals within the last 48 hours (phenothiazines, tricyclics or tetracyclic antidepressants, erythromycin, H-1 antihistamines, cisapride, fluoroquinolones, azithromycin). Drugs not listed above may have an, as yet, undetected potential to prolong the QT interval, updated information on QT prolonging agents is available at this website:QT prolonging agents Lexapro 20mg  daily but current Qtc is okay. QTc will continued to be monitored.  []  [x]  Patient received a dose of hydrochlorothiazide (Oretic) alone or in any combination including  triamterene (Dyazide, Maxzide) in the last 48 hours.   []  [x]  Patient received a medication known to increase dofetilide plasma concentrations prior to initial dofetilide dose:  . Trimethoprim (Primsol, Proloprim) in the last 36 hours . Verapamil (Calan, Verelan) in the last 36 hours or a sustained release dose in the last 72 hours . Megestrol (Megace) in the last 5 days  . Cimetidine (Tagamet) in the last 6 hours . Ketoconazole (Nizoral) in the last 24 hours . Itraconazole (Sporanox) in the last 48 hours  . Prochlorperazine (Compazine) in the last 36 hours    []  [x]  Patient is known to have a history of torsades de pointes; congenital or acquired long QT syndromes.   []  [x]  Patient has received a Class 1 antiarrhythmic with less than 2 half-lives since last dose. (Disopyramide, Quinidine, Procainamide, Lidocaine, Mexiletine, Flecainide, Propafenone)   []  [x]  Patient has received amiodarone therapy in the past 3 months or amiodarone level is greater than 0.3 ng/ml. Stopped Amiodarone therapy 02/18/16   Patient has been appropriately anticoagulated with Apixaban.  Ordering provider was confirmed at [email or web address] if they are not listed on the [place] Prescribers list.  Goal of Therapy: Follow renal function, electrolytes, potential drug interactions, and dose adjustment. Provide education and 1 week supply at discharge.  Plan:  [x]   Physician selected initial dose within range recommended for patients level of renal function - will monitor for response.  []   Physician selected initial dose outside of range recommended for patients level of renal function - will discuss if the dose should be altered  at this time.   Select One Calculated CrCl  Dose q12h  [x]  > 60 ml/min 500 mcg  []  40-60 ml/min 250 mcg  []  20-40 ml/min 125 mcg   2. Follow up QTc after the first 5 doses, renal function, electrolytes (K & Mg) daily x 3 days, dose adjustment, success of initiation and  facilitate 1 week discharge supply as clinically indicated.  3. Initiate Tikosyn education video (Call [phone] and ask for video # [phone]).  4. Place Enrollment Form on the chart for discharge supply of dofetilide.   Brain Hilts 5:46 PM 05/26/2016

## 2016-05-27 DIAGNOSIS — Z8249 Family history of ischemic heart disease and other diseases of the circulatory system: Secondary | ICD-10-CM | POA: Diagnosis not present

## 2016-05-27 DIAGNOSIS — Z7982 Long term (current) use of aspirin: Secondary | ICD-10-CM | POA: Diagnosis not present

## 2016-05-27 DIAGNOSIS — Z79899 Other long term (current) drug therapy: Secondary | ICD-10-CM | POA: Diagnosis not present

## 2016-05-27 DIAGNOSIS — I480 Paroxysmal atrial fibrillation: Secondary | ICD-10-CM | POA: Diagnosis not present

## 2016-05-27 DIAGNOSIS — I493 Ventricular premature depolarization: Secondary | ICD-10-CM | POA: Diagnosis not present

## 2016-05-27 DIAGNOSIS — Z85828 Personal history of other malignant neoplasm of skin: Secondary | ICD-10-CM | POA: Diagnosis not present

## 2016-05-27 DIAGNOSIS — Z91040 Latex allergy status: Secondary | ICD-10-CM | POA: Diagnosis not present

## 2016-05-27 DIAGNOSIS — Z888 Allergy status to other drugs, medicaments and biological substances status: Secondary | ICD-10-CM | POA: Diagnosis not present

## 2016-05-27 DIAGNOSIS — I10 Essential (primary) hypertension: Secondary | ICD-10-CM | POA: Diagnosis not present

## 2016-05-27 DIAGNOSIS — Z808 Family history of malignant neoplasm of other organs or systems: Secondary | ICD-10-CM | POA: Diagnosis not present

## 2016-05-27 DIAGNOSIS — I472 Ventricular tachycardia: Secondary | ICD-10-CM | POA: Diagnosis not present

## 2016-05-27 DIAGNOSIS — E039 Hypothyroidism, unspecified: Secondary | ICD-10-CM | POA: Diagnosis not present

## 2016-05-27 LAB — BASIC METABOLIC PANEL
Anion gap: 6 (ref 5–15)
BUN: 14 mg/dL (ref 6–20)
CHLORIDE: 105 mmol/L (ref 101–111)
CO2: 28 mmol/L (ref 22–32)
CREATININE: 0.7 mg/dL (ref 0.44–1.00)
Calcium: 9.6 mg/dL (ref 8.9–10.3)
GFR calc Af Amer: >60 mL/min (ref >60)
GFR calc non Af Amer: >60 mL/min (ref >60)
GLUCOSE: 92 mg/dL (ref 65–99)
Potassium: 3.8 mmol/L (ref 3.5–5.1)
SODIUM: 139 mmol/L (ref 135–145)

## 2016-05-27 LAB — MAGNESIUM: MAGNESIUM: 2 mg/dL (ref 1.7–2.4)

## 2016-05-27 MED ORDER — ESCITALOPRAM OXALATE 20 MG PO TABS
20.0000 mg | ORAL_TABLET | Freq: Every day | ORAL | Status: DC
Start: 2016-05-27 — End: 2016-05-29
  Administered 2016-05-27 – 2016-05-28 (×2): 20 mg via ORAL
  Filled 2016-05-27 (×2): qty 1

## 2016-05-27 MED ORDER — DOFETILIDE 250 MCG PO CAPS
375.0000 ug | ORAL_CAPSULE | Freq: Two times a day (BID) | ORAL | Status: DC
  Administered 2016-05-27: 375 ug via ORAL
  Filled 2016-05-27 (×2): qty 1

## 2016-05-27 MED ORDER — DOFETILIDE 250 MCG PO CAPS
250.0000 ug | ORAL_CAPSULE | Freq: Two times a day (BID) | ORAL | Status: DC
  Administered 2016-05-27 – 2016-05-28 (×3): 250 ug via ORAL
  Filled 2016-05-27 (×4): qty 1

## 2016-05-27 MED ORDER — ESCITALOPRAM OXALATE 20 MG PO TABS: 20.0000 mg | ORAL_TABLET | Freq: Every day | ORAL | Status: DC

## 2016-05-27 MED ORDER — POTASSIUM CHLORIDE CRYS ER 20 MEQ PO TBCR
20.0000 meq | EXTENDED_RELEASE_TABLET | Freq: Once | ORAL | Status: AC
  Administered 2016-05-27: 20 meq via ORAL
  Filled 2016-05-27: qty 1

## 2016-05-27 NOTE — Progress Notes (Signed)
    SUBJECTIVE: The patient is doing well today.  At this time, she denies chest pain, shortness of breath, or any new concerns.  CURRENT MEDICATIONS: . apixaban  5 mg Oral BID  . cholecalciferol  5,000 Units Oral Daily  . dofetilide  500 mcg Oral BID  . escitalopram  20 mg Oral Daily  . magnesium oxide  200 mg Oral Daily  . sodium chloride flush  3 mL Intravenous Q12H  . Thyroid  113.75 mg Oral QAC breakfast      OBJECTIVE: Physical Exam: Vitals:   05/26/16 1200 05/26/16 1900 05/26/16 2108 05/27/16 0529  BP: (!) 146/54  112/64 118/60  Pulse:   69 64  Resp:   18 18  Temp: 98.1 F (36.7 C)  98.3 F (36.8 C) 97.6 F (36.4 C)  TempSrc: Oral  Oral Oral  SpO2:   99% 98%  Weight:   145 lb 15.1 oz (66.2 kg)   Height:  5\' 5"  (1.651 m) 5\' 5"  (1.651 m)    No intake or output data in the 24 hours ending 05/27/16 0731  Telemetry reveals sinus rhythm, PVC's, runs of AF   GEN- The patient is well appearing, alert and oriented x 3 today.   Head- normocephalic, atraumatic Eyes-  Sclera clear, conjunctiva pink Ears- hearing intact Oropharynx- clear Neck- supple  Lungs- Clear to ausculation bilaterally, normal work of breathing Heart- Regular rate and rhythm  GI- soft, NT, ND, + BS Extremities- no clubbing, cyanosis, or edema Skin- no rash or lesion Psych- euthymic mood, full affect Neuro- strength and sensation are intact  LABS: Basic Metabolic Panel:  Recent Labs  05/26/16 1000 05/27/16 0242  NA 140 139  K 5.1 3.8  CL 106 105  CO2 28 28  GLUCOSE 92 92  BUN 12 14  CREATININE 0.74 0.70  CALCIUM 10.0 9.6  MG 2.0 2.0   CBC:  Recent Labs  05/26/16 1000  WBC 4.9  NEUTROABS 2.7  HGB 13.3  HCT 41.2  MCV 96.3  PLT 194   Thyroid Function Tests:  Recent Labs  05/26/16 1000  TSH 0.130*    RADIOLOGY: No results found.  ASSESSMENT AND PLAN:  Principal Problem:   PAF (paroxysmal atrial fibrillation) (HCC) Active Problems:   VENTRICULAR TACHYCARDIA   PVC  (premature ventricular contraction)  1.  PVC's/paroxysmal atrial fibrillation Pt admitted 05/26/16 for Tikosyn load Will supplement K today Mg ok, QTc slightly prolonged last night. Will repeat EKG before Tikosyn dose this morning Continue Eliquis for CHADS2VASC of 2  2.  HTN Stable No change required today 3. Depressed TSH  Check free T3 and T4  Chanetta Marshall, NP 05/27/2016 7:58 AM  Addendum - Dr Caryl Comes reviewed EKG, will decrease Tikosyn to 37mcg twice daily.  Have reviewed with pt  Anticipate discharge Fri AM

## 2016-05-27 NOTE — Research (Signed)
Apixaban Validation Study (ClinicalTrials.gov Identifier: [id number]) RESEARCH SUBJECT. Purpose: Obtain fresh samples that will be used to assess the performances of STA-Apixaban Calibrator and STA-Apixaban Control in combination with the STA-Liquid Anti-Xa to determine the quantity of apixaban in plasma samples by measurement of its direct anti-Xa activity. Apixaban Validation Study is sponsored by [employer].The fresh samples will collected by completing a one time blood draw on approved patients.   Inclusion Criteria: Must meet AT LEAST one of the following:  weight </= 60kg or >/= 75 years or Hct <39% for female or < 36% for female or renal impairment or co-medication with ASA/NSAIDs, or co-medication with anti-platelet agents.  Apixaban Validation Study Informed Consent   Subject Name: Castella Postlethwaite  This patient, Ranasia Massarelli, has been consented to the above clinical trial according to FDA regulations, GCP guidelines and [employer]'s SOPs. The informed consent form and study design have been explained to this patient by this study coordinator at 13:00 on 05/27/2016. The patient demonstrated comprehension of this clinical trial and study requirements/expectations. No study procedures have been initiated before consenting of this patient. The patient was given sufficient time for reading the consent form. All risks, benefits and options have been thoroughly discussed and all questions were answered per the patient's satisfaction. This patient was not coerced in any way to participate in this clinical trial. This patient has voluntarily signed consent version 2.0 at 13:50 on 05/27/2016. A copy of the signed consent form was given to the patient and a copy was placed in the subject's medical record. Subject was thanked for their participation in research and contribution to science.  Dennison Mascot, RN Clinical Research Nurse  [place], [facility] Office: [phone]

## 2016-05-27 NOTE — Progress Notes (Addendum)
NCM awaiting benefit check for Tykosyn  375mg  bid.  Per benefit check:  S/W DAWN @ [employer] # [phone]   TIKOSYN 250 MCG , 375 MCG AND 500 MCG  * NOT COVER *   DOFETILIDE 250 MCG BID  COVER- YES  CO-PAY- $ 15.00  TIER- 2 DRUG  PRIOR APPROVAL - NO   DOFETILIDE 125 MCG BID  COVER- YES  CO-PAY- $ 30.00  TIER- 2 DRUG  PRIOR APPROVAL - NO   DOFETILIDE 500 MCG  COVER- YES  CO-PAY- $ 15.00  TIER- 2 DRUG  PRIOR APPROVAL -NO   PHARMACY : C[facility] AND ANY RETAIL

## 2016-05-27 NOTE — Research (Signed)
Apixaban Validation Study (ClinicalTrials.gov Identifier: [id number]) RESEARCH SUBJECT. Purpose: Obtain fresh samples that will be used to assess the performances of STA-Apixaban Calibrator and STA-Apixaban Control in combination with the STA-Liquid Anti-Xa to determine the quantity of apixaban in plasma samples by measurement of its direct anti-Xa activity. Apixaban Validation Study is sponsored by [employer].The fresh samples will collected by completing a one time blood draw on approved patients.   Inclusion Criteria: Must meet AT LEAST one of the following:  weight </= 60kg or >/= 75 years or Hct <39% for female or < 36% for female or renal impairment or co-medication with ASA/NSAIDs, or co-medication with anti-platelet agents.  Update: Unable to draw labs at this time for purpose of above research study due to phlebotomy being short staffed. Patient verbalized understanding and is agreeable to have labs drawn tomorrow. This RN will follow up with phlebotomy tomorrow morning for courtesy blood draw. Site PI made aware.  Dennison Mascot, RN Clinical Research Nurse  [place], [facility] Office: [phone]

## 2016-05-27 NOTE — Progress Notes (Signed)
QTc long > 500 msec Will reduce dose to 250

## 2016-05-28 DIAGNOSIS — Z8249 Family history of ischemic heart disease and other diseases of the circulatory system: Secondary | ICD-10-CM | POA: Diagnosis not present

## 2016-05-28 DIAGNOSIS — I480 Paroxysmal atrial fibrillation: Secondary | ICD-10-CM | POA: Diagnosis not present

## 2016-05-28 DIAGNOSIS — I472 Ventricular tachycardia: Secondary | ICD-10-CM | POA: Diagnosis not present

## 2016-05-28 DIAGNOSIS — E039 Hypothyroidism, unspecified: Secondary | ICD-10-CM | POA: Diagnosis not present

## 2016-05-28 DIAGNOSIS — Z888 Allergy status to other drugs, medicaments and biological substances status: Secondary | ICD-10-CM | POA: Diagnosis not present

## 2016-05-28 DIAGNOSIS — I10 Essential (primary) hypertension: Secondary | ICD-10-CM

## 2016-05-28 DIAGNOSIS — Z79899 Other long term (current) drug therapy: Secondary | ICD-10-CM | POA: Diagnosis not present

## 2016-05-28 DIAGNOSIS — Z91040 Latex allergy status: Secondary | ICD-10-CM | POA: Diagnosis not present

## 2016-05-28 DIAGNOSIS — Z808 Family history of malignant neoplasm of other organs or systems: Secondary | ICD-10-CM | POA: Diagnosis not present

## 2016-05-28 DIAGNOSIS — Z85828 Personal history of other malignant neoplasm of skin: Secondary | ICD-10-CM | POA: Diagnosis not present

## 2016-05-28 DIAGNOSIS — Z7982 Long term (current) use of aspirin: Secondary | ICD-10-CM | POA: Diagnosis not present

## 2016-05-28 DIAGNOSIS — I493 Ventricular premature depolarization: Secondary | ICD-10-CM | POA: Diagnosis not present

## 2016-05-28 LAB — BASIC METABOLIC PANEL
ANION GAP: 6 (ref 5–15)
BUN: 15 mg/dL (ref 6–20)
CALCIUM: 9.8 mg/dL (ref 8.9–10.3)
CO2: 27 mmol/L (ref 22–32)
Chloride: 105 mmol/L (ref 101–111)
Creatinine, Ser: 0.64 mg/dL (ref 0.44–1.00)
GFR calc Af Amer: >60 mL/min (ref >60)
GFR calc non Af Amer: >60 mL/min (ref >60)
GLUCOSE: 110 mg/dL — AB (ref 65–99)
Potassium: 3.9 mmol/L (ref 3.5–5.1)
Sodium: 138 mmol/L (ref 135–145)

## 2016-05-28 LAB — T4, FREE: Free T4: 0.72 ng/dL (ref 0.61–1.12)

## 2016-05-28 LAB — MAGNESIUM: Magnesium: 2 mg/dL (ref 1.7–2.4)

## 2016-05-28 NOTE — Care Management Note (Addendum)
Case Management Note  Patient Details  Name: Patricia Alvarado MRN: [id number] Date of Birth: 1946/08/28  Subjective/Objective:  afib                  Action/Plan: Discharge Planning:  NCM spoke to pt and states she lives at home with husband. He is able to assist with care as needed. Explained her Tikosyn will be $15 at pharmacy. Pt goes to [facility] # [phone]. Pt will receive Tikosyn 7 days from [facility] main pharmacy to allow for her pharmacy to order medication. Left message for attending for Tikosyn Rx for 7 day.   PCP Nicoletta Dress MD      S/W DAWN @ [employer] # [phone]   TIKOSYN 250 MCG , 375 MCG AND 500 MCG  * NOT COVER *   DOFETILIDE 250 MCG BID  COVER- YES  CO-PAY- $ 15.00  TIER- 2 DRUG  PRIOR APPROVAL - NO   DOFETILIDE 125 MCG BID  COVER- YES  CO-PAY- $ 30.00  TIER- 2 DRUG  PRIOR APPROVAL - NO   DOFETILIDE 500 MCG  COVER- YES  CO-PAY- $ 15.00  TIER- 2 DRUG  PRIOR APPROVAL -NO   PHARMACY : [facility]      Expected Discharge Date:  05/29/2016               Expected Discharge Plan:  Home/Self Care  In-House Referral:  NA  Discharge planning Services  CM Consult, Medication Assistance  Post Acute Care Choice:  NA Choice offered to:  NA  DME Arranged:  N/A DME Agency:  NA  HH Arranged:  NA HH Agency:     Status of Service:  Completed, signed off  If discussed at [place] of Stay Meetings, dates discussed:    Additional Comments:  Erenest Rasher, RN 05/28/2016, 10:00 AM

## 2016-05-28 NOTE — Progress Notes (Signed)
    SUBJECTIVE: The patient is doing well today.  At this time, she denies chest pain, shortness of breath, or any new concerns.  Some mild postural dizziness noted yesterday  CURRENT MEDICATIONS: . apixaban  5 mg Oral BID  . cholecalciferol  5,000 Units Oral Daily  . dofetilide  250 mcg Oral BID  . escitalopram  20 mg Oral QHS  . magnesium oxide  200 mg Oral Daily  . sodium chloride flush  3 mL Intravenous Q12H  . Thyroid  113.75 mg Oral QAC breakfast      OBJECTIVE: Physical Exam: Vitals:   05/26/16 2108 05/27/16 0529 05/27/16 1900 05/28/16 0558  BP: 112/64 118/60 126/63 (!) 128/50  Pulse: 69 64 82 61  Resp: 18 18 18 16   Temp: 98.3 F (36.8 C) 97.6 F (36.4 C) 97.8 F (36.6 C) 97.7 F (36.5 C)  TempSrc: Oral Oral Oral Oral  SpO2: 99% 98% 97% 97%  Weight: 145 lb 15.1 oz (66.2 kg)     Height: 5\' 5"  (1.651 m)      No intake or output data in the 24 hours ending 05/28/16 0808  Telemetry reveals sinus rhythm, PVC's   GEN- The patient is well appearing, alert and oriented x 3 today.   Head- normocephalic, atraumatic Eyes-  Sclera clear, conjunctiva pink Ears- hearing intact Oropharynx- clear Neck- supple  Lungs- Clear to ausculation bilaterally, normal work of breathing Heart- Regular rate and rhythm  GI- soft, NT, ND, + BS Extremities- no clubbing, cyanosis, or edema Skin- no rash or lesion Psych- euthymic mood, full affect Neuro- strength and sensation are intact  LABS: Basic Metabolic Panel:  Recent Labs  05/27/16 0242 05/28/16 0319  NA 139 138  K 3.8 3.9  CL 105 105  CO2 28 27  GLUCOSE 92 110*  BUN 14 15  CREATININE 0.70 0.64  CALCIUM 9.6 9.8  MG 2.0 2.0   CBC:  Recent Labs  05/26/16 1000  WBC 4.9  NEUTROABS 2.7  HGB 13.3  HCT 41.2  MCV 96.3  PLT 194   Thyroid Function Tests:  Recent Labs  05/26/16 1000  TSH 0.130*    Qtc 490 msec today  ASSESSMENT AND PLAN:  1.  PVC's/paroxysmal atrial fibrillation Pt admitted 05/26/16 for  Tikosyn load Continue current tikosyn dosing Continue Eliquis for [facility] of 2  2.  HTN Stable No change required today  3. Depressed TSH  T4 normal, T3 pending  Thompson Grayer MD, [facility] 05/28/2016 8:09 AM

## 2016-05-29 DIAGNOSIS — Z85828 Personal history of other malignant neoplasm of skin: Secondary | ICD-10-CM | POA: Diagnosis not present

## 2016-05-29 DIAGNOSIS — I493 Ventricular premature depolarization: Secondary | ICD-10-CM | POA: Diagnosis not present

## 2016-05-29 DIAGNOSIS — E039 Hypothyroidism, unspecified: Secondary | ICD-10-CM | POA: Diagnosis not present

## 2016-05-29 DIAGNOSIS — Z91040 Latex allergy status: Secondary | ICD-10-CM | POA: Diagnosis not present

## 2016-05-29 DIAGNOSIS — I480 Paroxysmal atrial fibrillation: Secondary | ICD-10-CM | POA: Diagnosis not present

## 2016-05-29 DIAGNOSIS — Z7982 Long term (current) use of aspirin: Secondary | ICD-10-CM | POA: Diagnosis not present

## 2016-05-29 DIAGNOSIS — Z79899 Other long term (current) drug therapy: Secondary | ICD-10-CM | POA: Diagnosis not present

## 2016-05-29 DIAGNOSIS — Z808 Family history of malignant neoplasm of other organs or systems: Secondary | ICD-10-CM | POA: Diagnosis not present

## 2016-05-29 DIAGNOSIS — Z888 Allergy status to other drugs, medicaments and biological substances status: Secondary | ICD-10-CM | POA: Diagnosis not present

## 2016-05-29 DIAGNOSIS — I472 Ventricular tachycardia: Secondary | ICD-10-CM

## 2016-05-29 DIAGNOSIS — I10 Essential (primary) hypertension: Secondary | ICD-10-CM | POA: Diagnosis not present

## 2016-05-29 DIAGNOSIS — Z8249 Family history of ischemic heart disease and other diseases of the circulatory system: Secondary | ICD-10-CM | POA: Diagnosis not present

## 2016-05-29 LAB — MAGNESIUM: MAGNESIUM: 2.1 mg/dL (ref 1.7–2.4)

## 2016-05-29 LAB — T3, FREE: T3 FREE: 2.8 pg/mL (ref 2.0–4.4)

## 2016-05-29 LAB — BASIC METABOLIC PANEL
ANION GAP: 7 (ref 5–15)
BUN: 19 mg/dL (ref 6–20)
CALCIUM: 9.9 mg/dL (ref 8.9–10.3)
CO2: 28 mmol/L (ref 22–32)
CREATININE: 0.75 mg/dL (ref 0.44–1.00)
Chloride: 105 mmol/L (ref 101–111)
GLUCOSE: 98 mg/dL (ref 65–99)
Potassium: 4.7 mmol/L (ref 3.5–5.1)
Sodium: 140 mmol/L (ref 135–145)

## 2016-05-29 MED ORDER — APIXABAN 5 MG PO TABS: 5.0000 mg | ORAL_TABLET | Freq: Two times a day (BID) | ORAL | 1 refills | Status: DC

## 2016-05-29 MED ORDER — RANOLAZINE ER 500 MG PO TB12: 500.0000 mg | ORAL_TABLET | Freq: Two times a day (BID) | ORAL | 1 refills | Status: DC

## 2016-05-29 MED ORDER — FLECAINIDE ACETATE 100 MG PO TABS: 100.0000 mg | ORAL_TABLET | Freq: Two times a day (BID) | ORAL | 1 refills | Status: DC

## 2016-05-29 MED ORDER — DILTIAZEM HCL ER COATED BEADS 120 MG PO TB24: 120.0000 mg | ORAL_TABLET | Freq: Every day | ORAL | 1 refills | Status: DC

## 2016-05-29 NOTE — Care Management Important Message (Signed)
Important Message  Patient Details  Name: Patricia Alvarado MRN: [id number] Date of Birth: 17-Feb-1946   Medicare Important Message Given:  Yes    Marshia Tropea Abena 05/29/2016, 11:03 AM

## 2016-05-29 NOTE — Discharge Summary (Signed)
ELECTROPHYSIOLOGY PROCEDURE DISCHARGE SUMMARY    Patient ID: Patricia Alvarado,  MRN: [id number], DOB/AGE: May 06, 1946 70 y.o.  Admit date: 05/26/2016 Discharge date: 05/29/2016  Primary Care Physician: Nicoletta Dress, MD Electrophysiologist: Lovena Le  Primary Discharge Diagnosis:  Principal Problem:   PAF (paroxysmal atrial fibrillation) ([place]) Active Problems:   VENTRICULAR TACHYCARDIA   PVC (premature ventricular contraction)   Allergies  Allergen Reactions  . Quinidine Other (See Comments)    REACTION: increased heart rate  . Amoxicillin Itching and Rash  . Atenolol Hives  . Atorvastatin Other (See Comments)    REACTION: muscles pain  . Latex Other (See Comments)    Raw, rash and blisters - Tape only  . Nadolol Hives    Brief HPI:  Patricia Alvarado is a 70 y.o. female with a past medical history significant for NSVT, PVC's, hypertension, and paroxysmal atrial fibrillation. She has failed several AADs in the past.  She was seen by Dr Lovena Le and risks, benefits to [facility] were discussed with the patient who wished to proceed.    Hospital Course:  The patient was admitted and started on Tikosyn. Her dose was reduced 2/2 long QT.  She had persistent PVC's on decreased dose of Tikosyn. She was seen by Dr Caryl Comes the day of discharge who recommended discontinuing Tikosyn and restarting Flecainide on 05/30/16.  He also recommended starting Ranexa in 10 days and then follow up with Dr Lovena Le as scheduled.  She was considered stable for discharge to home by Dr Caryl Comes.  Will add Eliquis for CHADS2VASC of 3 Will add Diltiazem for AVN blocking affect while on Flecainide.    Physical Exam: Vitals:   05/28/16 0558 05/28/16 1451 05/28/16 1944 05/29/16 0615  BP: (!) 128/50 137/67 (!) 119/52 (!) 131/47  Pulse: 61 68 78 (!) 59  Resp: 16 18 19 20   Temp: 97.7 F (36.5 C) 98.2 F (36.8 C) 98.4 F (36.9 C) 97.4 F (36.3 C)  TempSrc: Oral Oral Oral Oral  SpO2: 97% 98% 97% 98%  Weight:       Height:           Labs:   Lab Results  Component Value Date   WBC 4.9 05/26/2016   HGB 13.3 05/26/2016   HCT 41.2 05/26/2016   MCV 96.3 05/26/2016   PLT 194 05/26/2016     Recent Labs Lab 05/29/16 0232  NA 140  K 4.7  CL 105  CO2 28  BUN 19  CREATININE 0.75  CALCIUM 9.9  GLUCOSE 98     Discharge Medications:  Current Discharge Medication List    START taking these medications   Details  apixaban (ELIQUIS) 5 MG TABS tablet Take 1 tablet (5 mg total) by mouth 2 (two) times daily. Qty: 60 tablet, Refills: 1    diltiazem (CARDIZEM LA) 120 MG 24 hr tablet Take 1 tablet (120 mg total) by mouth daily. Start 05/30/16 Qty: 30 tablet, Refills: 1    flecainide (TAMBOCOR) 100 MG tablet Take 1 tablet (100 mg total) by mouth 2 (two) times daily. Start on Saturday 05/30/16 evening Qty: 60 tablet, Refills: 1    ranolazine (RANEXA) 500 MG 12 hr tablet Take 1 tablet (500 mg total) by mouth 2 (two) times daily. Start on 06/09/2016 Qty: 60 tablet, Refills: 1      CONTINUE these medications which have NOT CHANGED   Details  acetaminophen (TYLENOL) 500 MG tablet Take 500-1,000 mg by mouth every 6 (six) hours as needed for mild  pain or moderate pain.    aspirin 81 MG EC tablet Take 81 mg by mouth daily.      B Complex-C (SUPER B COMPLEX PO) Take 1 capsule by mouth at bedtime.    Biotin 5 MG CAPS Take 5 mg by mouth at bedtime.     cetirizine (ZYRTEC) 10 MG tablet Take 10 mg by mouth daily as needed for allergies.     Coenzyme Q10 (COQ10) 100 MG CAPS Take 100 mg by mouth daily.     escitalopram (LEXAPRO) 20 MG tablet Take 20 mg by mouth daily.      MAGNESIUM PO Take 1 tablet by mouth daily. Magnesium Malate    MANGANESE PO Take 40 mg by mouth at bedtime.    Melatonin 10 MG TABS Take 10 mg by mouth at bedtime.     metroNIDAZOLE (METROCREAM) 0.75 % cream Apply 1 application topically at bedtime.    mometasone (NASONEX) 50 MCG/ACT nasal spray Place 2 sprays into the nose  daily as needed (allergies).     naproxen sodium (ANAPROX) 220 MG tablet Take 220 mg by mouth as needed (for pain or headache).    OVER THE COUNTER MEDICATION Take 1,000 mg by mouth daily. Niacin time release OTC    Thiamine Mononitrate (VITAMIN B1 PO) Take 1 tablet by mouth at bedtime.    VITAMIN D, CHOLECALCIFEROL, PO Take 5,000 Units by mouth daily.    ibuprofen (ADVIL,MOTRIN) 200 MG tablet Take 200 mg by mouth daily as needed (pain).         Disposition: Pt is being discharged home today in good condition. Discharge Instructions    Diet - low sodium heart healthy    Complete by:  As directed   Increase activity slowly    Complete by:  As directed     Follow-up Information    Cristopher Peru, MD Follow up on 06/23/2016.   Specialty:  Cardiology Why:  at 12:15PM  Contact information: [address] [address] [phone]           Duration of Discharge Encounter: Greater than 30 minutes including physician time.  Signed, Chanetta Marshall, NP 05/29/2016 10:26 AM

## 2016-05-29 NOTE — Progress Notes (Signed)
Ordered received to discharge.  Telemetry removed and CCMD notified.  IV removed with catheter intact.  Discharge education given to Pt with significant other at bedside.  Educational materials given on Cardizem and ranolazine.  All questions answered.  Pt denies chest pain or sob.  Pt stable to discharge.

## 2016-05-29 NOTE — Progress Notes (Signed)
S[facility] and all medications in stock. Provided pharmacist with Eliquis 30 day free trial card info. She added to account. All Rx will total $98.00. Made pt aware. Jonnie Finner RN CCM Case Mgmt phone [phone]

## 2016-05-29 NOTE — Progress Notes (Signed)
    SUBJECTIVE: The patient is doing well today.  At this time, she denies chest pain, shortness of breath, or any new concerns.  Some mild postural dizziness noted yesterday  CURRENT MEDICATIONS: . apixaban  5 mg Oral BID  . cholecalciferol  5,000 Units Oral Daily  . dofetilide  250 mcg Oral BID  . escitalopram  20 mg Oral QHS  . magnesium oxide  200 mg Oral Daily  . sodium chloride flush  3 mL Intravenous Q12H  . Thyroid  113.75 mg Oral QAC breakfast      OBJECTIVE: Physical Exam: Vitals:   05/28/16 0558 05/28/16 1451 05/28/16 1944 05/29/16 0615  BP: (!) 128/50 137/67 (!) 119/52 (!) 131/47  Pulse: 61 68 78 (!) 59  Resp: 16 18 19 20   Temp: 97.7 F (36.5 C) 98.2 F (36.8 C) 98.4 F (36.9 C) 97.4 F (36.3 C)  TempSrc: Oral Oral Oral Oral  SpO2: 97% 98% 97% 98%  Weight:      Height:       No intake or output data in the 24 hours ending 05/29/16 0852  Telemetry reveals sinus rhythm, PVC's   GEN- The patient is well appearing, alert and oriented x 3 today.   Head- normocephalic, atraumatic Eyes-  Sclera clear, conjunctiva pink Ears- hearing intact Oropharynx- clear Neck- supple  Lungs- Clear to ausculation bilaterally, normal work of breathing Heart- Regular rate and rhythm  GI- soft, NT, ND, + BS Extremities- no clubbing, cyanosis, or edema Skin- no rash or lesion Psych- euthymic mood, full affect Neuro- strength and sensation are intact  LABS: Basic Metabolic Panel:  Recent Labs  05/28/16 0319 05/29/16 0232  NA 138 140  K 3.9 4.7  CL 105 105  CO2 27 28  GLUCOSE 110* 98  BUN 15 19  CREATININE 0.64 0.75  CALCIUM 9.8 9.9  MG 2.0 2.1   CBC:  Recent Labs  05/26/16 1000  WBC 4.9  NEUTROABS 2.7  HGB 13.3  HCT 41.2  MCV 96.3  PLT 194   Thyroid Function Tests:  Recent Labs  05/26/16 1000 05/28/16 0319  TSH 0.130*  --   T3FREE  --  2.8    Qtc 490 msec today  ASSESSMENT AND PLAN:  1.  PVC's/paroxysmal atrial fibrillation  Increasing  frequency of ventricular ectopy Will discontinue dofetilide and resume flecainide and in about 10 days add ranolazine  Lengthy discussion re risk benefit of dofetilide   We will allow wash out of dofetilide-- T1/2 about 10 hrs. So should be <25% by 1800 hrs   Continue Eliquis for CHADS2VASC of 2  2.  HTN Stable No change required today  3. Depressed TSH  T4 normal, T3 normal Virl Axe  MD, [facility] 05/29/2016 8:52 AM

## 2016-06-01 DIAGNOSIS — L299 Pruritus, unspecified: Secondary | ICD-10-CM | POA: Diagnosis not present

## 2016-06-01 DIAGNOSIS — L958 Other vasculitis limited to the skin: Secondary | ICD-10-CM | POA: Diagnosis not present

## 2016-06-02 DIAGNOSIS — A770 Spotted fever due to Rickettsia rickettsii: Secondary | ICD-10-CM | POA: Diagnosis not present

## 2016-06-03 ENCOUNTER — Encounter: Payer: Self-pay | Admitting: Internal Medicine

## 2016-06-03 DIAGNOSIS — E039 Hypothyroidism, unspecified: Secondary | ICD-10-CM | POA: Diagnosis not present

## 2016-06-03 DIAGNOSIS — I4940 Unspecified premature depolarization: Secondary | ICD-10-CM | POA: Diagnosis not present

## 2016-06-05 ENCOUNTER — Telehealth: Payer: Self-pay | Admitting: Internal Medicine

## 2016-06-05 NOTE — Telephone Encounter (Signed)
New message        Pt was recently discharged from the [facility].  According to the discharge instructions, she is to start ranexa on Tuesday but she is on diltiazem.  Can she take both medicatins?

## 2016-06-05 NOTE — Telephone Encounter (Signed)
PT  NOTIFIED  MAY  TAKE  BOTH  MEDS   ALSO CONFIRMED  WITH   AMBER Eakly NP .Adonis Housekeeper

## 2016-06-19 DIAGNOSIS — I10 Essential (primary) hypertension: Secondary | ICD-10-CM | POA: Diagnosis not present

## 2016-06-19 DIAGNOSIS — E785 Hyperlipidemia, unspecified: Secondary | ICD-10-CM | POA: Diagnosis not present

## 2016-06-23 ENCOUNTER — Encounter: Payer: Self-pay | Admitting: Internal Medicine

## 2016-06-23 ENCOUNTER — Ambulatory Visit (INDEPENDENT_AMBULATORY_CARE_PROVIDER_SITE_OTHER): Payer: PPO | Admitting: Internal Medicine

## 2016-06-23 VITALS — BP 130/82 | HR 66 | Ht 65.0 in | Wt 144.6 lb

## 2016-06-23 DIAGNOSIS — I493 Ventricular premature depolarization: Secondary | ICD-10-CM

## 2016-06-23 MED ORDER — FLECAINIDE ACETATE 100 MG PO TABS: ORAL_TABLET | ORAL | 6 refills | Status: DC

## 2016-06-23 NOTE — Progress Notes (Signed)
HPI Patricia Alvarado returns today for followup. She is a very pleasant 70 year old woman with a history of nonsustained ventricular tachycardia and frequent PVCs, hypertension, who underwent EP study years ago and had no PVC's to ablate. She has taken multiple different AA drugs in the past including amiodarone, sotalol, rhythmol, flecainide, quinidine but has continued to have symptoms. The patient has worn a cardiac monitor which demonstrated NSVT, PVC's with multiple morphologies, and suprisingly episodes of atrial fibrillation with an IRIR response. She has had worsening palpitations. She has never had documented atrial fib. She took amiodarone for 3 weeks most recently but stopped this medication. We had her come in for Tikosyn and her QT prolonged. She had more PVC's. Tikosyn was stopped. She has gone back to flecainide. She is taking 100 mg bid flecainide. Allergies  Allergen Reactions  . Quinidine Other (See Comments)    REACTION: increased heart rate  . Dofetilide Other (See Comments)    Elevated QTCs  . Amoxicillin Itching and Rash  . Atenolol Hives  . Atorvastatin Other (See Comments)    REACTION: muscles pain  . Latex Other (See Comments)    Raw, rash and blisters - Tape only  . Nadolol Hives     Current Outpatient Prescriptions  Medication Sig Dispense Refill  . acetaminophen (TYLENOL) 500 MG tablet Take 500-1,000 mg by mouth every 6 (six) hours as needed for mild pain or moderate pain.    Marland Kitchen apixaban (ELIQUIS) 5 MG TABS tablet Take 1 tablet (5 mg total) by mouth 2 (two) times daily. 60 tablet 1  . aspirin 81 MG EC tablet Take 81 mg by mouth daily.      . B Complex-C (SUPER B COMPLEX PO) Take 1 capsule by mouth at bedtime.    . Biotin 5 MG CAPS Take 5 mg by mouth at bedtime.     . cetirizine (ZYRTEC) 10 MG tablet Take 10 mg by mouth daily as needed for allergies.     . Coenzyme Q10 (COQ10) 100 MG CAPS Take 100 mg by mouth daily.     Marland Kitchen diltiazem (CARDIZEM LA) 120 MG 24 hr tablet  Take 1 tablet (120 mg total) by mouth daily. Start 05/30/16 30 tablet 1  . escitalopram (LEXAPRO) 20 MG tablet Take 20 mg by mouth daily.      . flecainide (TAMBOCOR) 100 MG tablet Take 100mg  by mouth twice daily and may take an additional 100 mg daily for increased palpitations 75 tablet 6  . ibuprofen (ADVIL,MOTRIN) 200 MG tablet Take 200 mg by mouth daily as needed (pain).     Marland Kitchen MAGNESIUM PO Take 1 tablet by mouth daily. Magnesium Malate    . MANGANESE PO Take 40 mg by mouth at bedtime.    . Melatonin 10 MG TABS Take 10 mg by mouth at bedtime.     . metroNIDAZOLE (METROCREAM) 0.75 % cream Apply 1 application topically at bedtime.    . mometasone (NASONEX) 50 MCG/ACT nasal spray Place 2 sprays into the nose daily as needed (allergies).     . naproxen sodium (ANAPROX) 220 MG tablet Take 220 mg by mouth as needed (for pain or headache).    . OVER THE COUNTER MEDICATION Take 1,000 mg by mouth daily. Niacin time release OTC    . PRESCRIPTION MEDICATION (Nature Throid) Take 113.75 Monday-Friday and 97.5 on Saturday and Sunday  by mouth    . ranolazine (RANEXA) 500 MG 12 hr tablet Take 1 tablet (500 mg total) by mouth 2 (  two) times daily. Start on 06/09/2016 60 tablet 1  . Thiamine Mononitrate (VITAMIN B1 PO) Take 1 tablet by mouth at bedtime.    Marland Kitchen VITAMIN D, CHOLECALCIFEROL, PO Take 5,000 Units by mouth daily.     No current facility-administered medications for this visit.      Past Medical History:  Diagnosis Date  . Basal cell carcinoma 2001   "forehead, between eyebrows"  . Carcinoma of thyroid gland ([place]) 2001  . Chronic lower back pain    "worse is across my hips" (05/26/2016)  . Depression   . Dyslipidemia   . Fibrocystic breast   . Fibromyalgia   . Heart murmur   . History of blood transfusion 1992   "after subcutaneous mastectomies"  . Hypothyroidism   . Malignant melanoma of left ankle ([place]) 2001  . Migraine    "visual; 2-3 times/year" (05/26/2016)  . Mitral valve prolapse   .  Ventricular tachycardia ([facility])    Hx of, controlled on sotalol therapy    ROS:   All systems reviewed and negative except as noted in the HPI.   Past Surgical History:  Procedure Laterality Date  . ABDOMINAL HYSTERECTOMY  1998  . ANTERIOR CERVICAL DECOMP/DISCECTOMY FUSION  2001  . BACK SURGERY    . BASAL CELL CARCINOMA EXCISION  2001   "cut it out & did a flap, on forehead between my eyebrows"  . BREAST IMPLANT EXCHANGE Bilateral 2001   [id number]  . CARPAL TUNNEL RELEASE Right 2006  . COLONOSCOPY    . DILATION AND CURETTAGE OF UTERUS  1970s X 2-3  . ELECTROPHYSIOLOGIC STUDY  1994 X 2;2001   "to see it it was sustained VT; cause thyroid levels were causing arrhythmias"  . LAPAROSCOPIC CHOLECYSTECTOMY    . MASTECTOMY Bilateral 1992   "subcutaneous"  . MELANOMA EXCISION Left 2001   "ankle, stage I"  . PLACEMENT OF BREAST IMPLANTS Bilateral 1992   [id number]  . TOTAL THYROIDECTOMY  12/1999   "cancer"  . VENTRICULAR ABLATION SURGERY  2011   ventricular tchycardia     Family History  Problem Relation Age of Onset  . Cancer Brother     Skin  . Other Brother     AGENT ORANGE and AntiLupus  . Heart disease Brother     Stents and bypass x2  . Arrhythmia Brother     AFIB  . Heart attack Brother   . Hypertension Brother   . Hyperlipidemia Mother 60  . Osteoporosis Mother   . Heart disease Father 67  . Other Father     Cardiac arrest  . Breast cancer Other   . Thyroid cancer Neg Hx      Social History   Social History  . Marital status: Married    Spouse name: N/A  . Number of children: N/A  . Years of education: N/A   Occupational History  . [employer]   Social History Main Topics  . Smoking status: Never Smoker  . Smokeless tobacco: Never Used  . Alcohol use No  . Drug use: No  . Sexual activity: Yes   Other Topics Concern  . Not on file   Social History Narrative   Married     BP 130/82   Pulse 66   Ht 5\' 5"  (1.651 m)   Wt 144 lb  9.6 oz (65.6 kg)   BMI 24.06 kg/m   Physical Exam:  Well appearing 70 year old woman, NAD HEENT: Unremarkable Neck:  7 cm JVD, no  thyromegally Back:  No CVA tenderness Lungs:  Clear with no wheezes, rales, or rhonchi. HEART:  IRegular rate rhythm, no murmurs, no rubs, no clicks Abd:  soft, positive bowel sounds, no organomegally, no rebound, no guarding Ext:  2 plus pulses, no edema, no cyanosis, no clubbing Skin:  No rashes no nodules Neuro:  CN II through XII intact, motor grossly intact  Cardiac monitor - reviewed and as above.  Assess/Plan: 1. Palpitations - Her symptoms remain. I have asked her to take an extra flecainide if she has recurrent and sustained atrial fib. 2. VT - she has had no sustained episodes. See plan as above. 3. HTN - her blood pressure is controlled. Will follow. 4. PAF - this is a new finding. Hopefully she can be controlled on flecainide. She will continue Eliquis.  Mikle Bosworth.D.

## 2016-06-23 NOTE — Patient Instructions (Addendum)
Medication Instructions:  Your physician has recommended you make the following change in your medication:  1) Take your Flecainide 100 mg twice daily and may take an additional 100 mg daily for increased palpitations     Labwork: None ordered   Testing/Procedures: None ordered   Follow-Up: Your physician recommends that you schedule a follow-up appointment in: 6 weeks with Dr Lovena Le   Any Other Special Instructions Will Be Listed Below (If Applicable).     If you need a refill on your cardiac medications before your next appointment, please call your pharmacy.

## 2016-07-16 ENCOUNTER — Telehealth: Payer: Self-pay | Admitting: Internal Medicine

## 2016-07-16 NOTE — Telephone Encounter (Signed)
Still having the issues with muscle twitching and jerking and feeling weak in legs all the time. Arms feel weak also.  She wants to stop and see if this gets any better. I let her know I would let Dr Lovena Le know on Tues when he returns to the office.  She was appreciative of my return call.

## 2016-07-16 NOTE — Telephone Encounter (Signed)
Pt c/o medication issue:  1. Name of Medication: ranexa   2. How are you currently taking this medication (dosage and times per day)? 500 bid   3. Are you having a reaction (difficulty breathing--STAT)? no  4. What is your medication issue? muscle issues she verbalized that she wishes to not take it

## 2016-07-20 NOTE — Telephone Encounter (Signed)
Follow Up:     Pt wanted you to know she started back taking the Ranexa on Saturday night at 8:00. She started it back,because the increase of her arrhthymias.

## 2016-07-21 NOTE — Telephone Encounter (Signed)
Spoke with patient's husband.  She has gone to breakfast and to work out.  He will give her my message and if she needs to call me back she can.

## 2016-07-27 ENCOUNTER — Other Ambulatory Visit: Payer: Self-pay | Admitting: Internal Medicine

## 2016-07-27 MED ORDER — DILTIAZEM HCL ER COATED BEADS 120 MG PO TB24
120.0000 mg | ORAL_TABLET | Freq: Every day | ORAL | 3 refills | Status: DC
Start: 2016-07-27 — End: 2017-02-15

## 2016-07-27 MED ORDER — APIXABAN 5 MG PO TABS: 5.0000 mg | ORAL_TABLET | Freq: Two times a day (BID) | ORAL | 3 refills | Status: DC

## 2016-08-05 ENCOUNTER — Encounter: Payer: Self-pay | Admitting: Internal Medicine

## 2016-08-05 ENCOUNTER — Other Ambulatory Visit: Payer: Self-pay | Admitting: *Deleted

## 2016-08-05 MED ORDER — RANOLAZINE ER 500 MG PO TB12: 500.0000 mg | ORAL_TABLET | Freq: Two times a day (BID) | ORAL | 3 refills | Status: DC

## 2016-08-18 ENCOUNTER — Encounter: Payer: Self-pay | Admitting: Internal Medicine

## 2016-08-18 ENCOUNTER — Ambulatory Visit (INDEPENDENT_AMBULATORY_CARE_PROVIDER_SITE_OTHER): Payer: PPO | Admitting: Internal Medicine

## 2016-08-18 VITALS — BP 112/78 | HR 74 | Ht 65.0 in | Wt 145.6 lb

## 2016-08-18 DIAGNOSIS — I493 Ventricular premature depolarization: Secondary | ICD-10-CM

## 2016-08-18 NOTE — Patient Instructions (Signed)

## 2016-08-18 NOTE — Progress Notes (Signed)
HPI Patricia Alvarado returns today for followup. She is a very pleasant 70 year old woman with a history of nonsustained ventricular tachycardia and frequent PVCs, hypertension, who underwent EP study years ago and had no PVC's to ablate. She has taken multiple different AA drugs in the past including amiodarone, sotalol, rhythmol, flecainide, quinidine but has continued to have symptoms. The patient has worn a cardiac monitor which demonstrated NSVT, PVC's with multiple morphologies, and suprisingly episodes of atrial fibrillation with an IRIR response. Her palpitations have been better. She has gone back to flecainide. She is taking 100 mg bid flecainide. She take an additional flecainide on days where she is having additional symptoms.  Allergies  Allergen Reactions  . Quinidine Other (See Comments)    REACTION: increased heart rate  . Dofetilide Other (See Comments)    Elevated QTCs  . Amoxicillin Itching and Rash  . Atenolol Hives  . Atorvastatin Other (See Comments)    REACTION: muscles pain  . Latex Other (See Comments)    Raw, rash and blisters - Tape only  . Nadolol Hives     Current Outpatient Prescriptions  Medication Sig Dispense Refill  . acetaminophen (TYLENOL) 500 MG tablet Take 500-1,000 mg by mouth every 6 (six) hours as needed for mild pain or moderate pain.    Marland Kitchen apixaban (ELIQUIS) 5 MG TABS tablet Take 1 tablet (5 mg total) by mouth 2 (two) times daily. 180 tablet 3  . aspirin 81 MG EC tablet Take 81 mg by mouth daily.      . B Complex-C (SUPER B COMPLEX PO) Take 1 capsule by mouth at bedtime.    . Biotin 5 MG CAPS Take 5 mg by mouth at bedtime.     . cetirizine (ZYRTEC) 10 MG tablet Take 10 mg by mouth daily as needed for allergies.     . Coenzyme Q10 (COQ10) 100 MG CAPS Take 100 mg by mouth daily.     Marland Kitchen diltiazem (CARDIZEM LA) 120 MG 24 hr tablet Take 1 tablet (120 mg total) by mouth daily. 90 tablet 3  . escitalopram (LEXAPRO) 20 MG tablet Take 20 mg by mouth daily.       . flecainide (TAMBOCOR) 100 MG tablet Take 100mg  by mouth twice daily and may take an additional 100 mg daily for increased palpitations 75 tablet 6  . ibuprofen (ADVIL,MOTRIN) 200 MG tablet Take 200 mg by mouth daily as needed (pain).     Marland Kitchen MAGNESIUM PO Take 1 tablet by mouth daily. Magnesium Malate    . MANGANESE PO Take 40 mg by mouth at bedtime.    . Melatonin 10 MG TABS Take 10 mg by mouth at bedtime.     . metroNIDAZOLE (METROCREAM) 0.75 % cream Apply 1 application topically at bedtime.    . mometasone (NASONEX) 50 MCG/ACT nasal spray Place 2 sprays into the nose daily as needed (allergies).     . naproxen sodium (ANAPROX) 220 MG tablet Take 220 mg by mouth as needed (for pain or headache).    . OVER THE COUNTER MEDICATION Take 1,000 mg by mouth daily. Niacin time release OTC    . PRESCRIPTION MEDICATION (Nature Throid) Take 113.75 Monday-Friday and 97.5 on Saturday and Sunday  by mouth    . ranolazine (RANEXA) 500 MG 12 hr tablet Take 1 tablet (500 mg total) by mouth 2 (two) times daily. Start on 06/09/2016 180 tablet 3  . Thiamine Mononitrate (VITAMIN B1 PO) Take 1 tablet by mouth at bedtime.    Marland Kitchen  VITAMIN D, CHOLECALCIFEROL, PO Take 5,000 Units by mouth daily.     No current facility-administered medications for this visit.      Past Medical History:  Diagnosis Date  . Basal cell carcinoma 2001   "forehead, between eyebrows"  . Carcinoma of thyroid gland ([place]) 2001  . Chronic lower back pain    "worse is across my hips" (05/26/2016)  . Depression   . Dyslipidemia   . Fibrocystic breast   . Fibromyalgia   . Heart murmur   . History of blood transfusion 1992   "after subcutaneous mastectomies"  . Hypothyroidism   . Malignant melanoma of left ankle ([place]) 2001  . Migraine    "visual; 2-3 times/year" (05/26/2016)  . Mitral valve prolapse   . Ventricular tachycardia (HCC)    Hx of, controlled on sotalol therapy    ROS:   All systems reviewed and negative except as noted in  the HPI.   Past Surgical History:  Procedure Laterality Date  . ABDOMINAL HYSTERECTOMY  1998  . ANTERIOR CERVICAL DECOMP/DISCECTOMY FUSION  2001  . BACK SURGERY    . BASAL CELL CARCINOMA EXCISION  2001   "cut it out & did a flap, on forehead between my eyebrows"  . BREAST IMPLANT EXCHANGE Bilateral 2001   [id number]  . CARPAL TUNNEL RELEASE Right 2006  . COLONOSCOPY    . DILATION AND CURETTAGE OF UTERUS  1970s X 2-3  . ELECTROPHYSIOLOGIC STUDY  1994 X 2;2001   "to see it it was sustained VT; cause thyroid levels were causing arrhythmias"  . LAPAROSCOPIC CHOLECYSTECTOMY    . MASTECTOMY Bilateral 1992   "subcutaneous"  . MELANOMA EXCISION Left 2001   "ankle, stage I"  . PLACEMENT OF BREAST IMPLANTS Bilateral 1992   [id number]  . TOTAL THYROIDECTOMY  12/1999   "cancer"  . VENTRICULAR ABLATION SURGERY  2011   ventricular tchycardia     Family History  Problem Relation Age of Onset  . Cancer Brother     Skin  . Other Brother     AGENT ORANGE and AntiLupus  . Heart disease Brother     Stents and bypass x2  . Arrhythmia Brother     AFIB  . Heart attack Brother   . Hypertension Brother   . Hyperlipidemia Mother 22  . Osteoporosis Mother   . Heart disease Father 61  . Other Father     Cardiac arrest  . Breast cancer Other   . Thyroid cancer Neg Hx      Social History   Social History  . Marital status: Married    Spouse name: N/A  . Number of children: N/A  . Years of education: N/A   Occupational History  . [employer]   Social History Main Topics  . Smoking status: Never Smoker  . Smokeless tobacco: Never Used  . Alcohol use No  . Drug use: No  . Sexual activity: Yes   Other Topics Concern  . Not on file   Social History Narrative   Married     BP 112/78   Pulse 74   Ht 5\' 5"  (1.651 m)   Wt 145 lb 9.6 oz (66 kg)   BMI 24.23 kg/m   Physical Exam:  Well appearing 70 year old woman, NAD HEENT: Unremarkable Neck:  7 cm JVD, no  thyromegally Back:  No CVA tenderness Lungs:  Clear with no wheezes, rales, or rhonchi. HEART:  IRegular rate rhythm, no murmurs, no rubs, no clicks Abd:  soft, positive bowel sounds, no organomegally, no rebound, no guarding Ext:  2 plus pulses, no edema, no cyanosis, no clubbing Skin:  No rashes no nodules Neuro:  CN II through XII intact, motor grossly intact  Cardiac monitor - reviewed and as above.  Assess/Plan: 1. Palpitations - Overall she is improved. I have asked her to take an extra flecainide if she has recurrent and sustained atrial fib. She has taken 4 extra in the past 2 months. 2. VT - she has had no sustained episodes. See plan as above. 3. HTN - her blood pressure is controlled. Will follow. 4. PAF - Hopefully she can be controlled on flecainide. She will continue Eliquis. Minimal if any symptoms.  Mikle Bosworth.D.

## 2016-09-21 DIAGNOSIS — M797 Fibromyalgia: Secondary | ICD-10-CM | POA: Diagnosis not present

## 2016-09-21 DIAGNOSIS — E039 Hypothyroidism, unspecified: Secondary | ICD-10-CM | POA: Diagnosis not present

## 2016-09-21 DIAGNOSIS — I480 Paroxysmal atrial fibrillation: Secondary | ICD-10-CM | POA: Diagnosis not present

## 2016-09-21 DIAGNOSIS — Z23 Encounter for immunization: Secondary | ICD-10-CM | POA: Diagnosis not present

## 2016-09-21 DIAGNOSIS — I10 Essential (primary) hypertension: Secondary | ICD-10-CM | POA: Diagnosis not present

## 2016-09-21 DIAGNOSIS — F329 Major depressive disorder, single episode, unspecified: Secondary | ICD-10-CM | POA: Diagnosis not present

## 2016-11-27 DIAGNOSIS — L821 Other seborrheic keratosis: Secondary | ICD-10-CM | POA: Diagnosis not present

## 2016-11-27 DIAGNOSIS — D1801 Hemangioma of skin and subcutaneous tissue: Secondary | ICD-10-CM | POA: Diagnosis not present

## 2016-11-27 DIAGNOSIS — D225 Melanocytic nevi of trunk: Secondary | ICD-10-CM | POA: Diagnosis not present

## 2016-11-27 DIAGNOSIS — C44119 Basal cell carcinoma of skin of left eyelid, including canthus: Secondary | ICD-10-CM | POA: Diagnosis not present

## 2016-11-27 DIAGNOSIS — D2239 Melanocytic nevi of other parts of face: Secondary | ICD-10-CM | POA: Diagnosis not present

## 2016-12-22 DIAGNOSIS — E039 Hypothyroidism, unspecified: Secondary | ICD-10-CM | POA: Diagnosis not present

## 2016-12-22 DIAGNOSIS — I10 Essential (primary) hypertension: Secondary | ICD-10-CM | POA: Diagnosis not present

## 2017-01-21 DIAGNOSIS — S46919A Strain of unspecified muscle, fascia and tendon at shoulder and upper arm level, unspecified arm, initial encounter: Secondary | ICD-10-CM | POA: Diagnosis not present

## 2017-01-21 DIAGNOSIS — M549 Dorsalgia, unspecified: Secondary | ICD-10-CM | POA: Diagnosis not present

## 2017-01-28 ENCOUNTER — Encounter: Payer: Self-pay | Admitting: Internal Medicine

## 2017-02-15 ENCOUNTER — Ambulatory Visit (INDEPENDENT_AMBULATORY_CARE_PROVIDER_SITE_OTHER): Payer: PPO | Admitting: Internal Medicine

## 2017-02-15 ENCOUNTER — Encounter: Payer: Self-pay | Admitting: Internal Medicine

## 2017-02-15 VITALS — BP 140/80 | HR 76 | Ht 65.0 in | Wt 150.2 lb

## 2017-02-15 DIAGNOSIS — I472 Ventricular tachycardia: Secondary | ICD-10-CM

## 2017-02-15 DIAGNOSIS — I4729 Other ventricular tachycardia: Secondary | ICD-10-CM

## 2017-02-15 MED ORDER — FLECAINIDE ACETATE 100 MG PO TABS: ORAL_TABLET | ORAL | 3 refills | Status: DC

## 2017-02-15 MED ORDER — DILTIAZEM HCL ER COATED BEADS 120 MG PO TB24: 120.0000 mg | ORAL_TABLET | Freq: Every day | ORAL | 3 refills | Status: DC

## 2017-02-15 MED ORDER — RANOLAZINE ER 500 MG PO TB12: 500.0000 mg | ORAL_TABLET | Freq: Two times a day (BID) | ORAL | 3 refills | Status: DC

## 2017-02-15 MED ORDER — APIXABAN 5 MG PO TABS: 5.0000 mg | ORAL_TABLET | Freq: Two times a day (BID) | ORAL | 3 refills | Status: DC

## 2017-02-15 NOTE — Progress Notes (Signed)
HPI Patricia Alvarado returns today for followup. She is a very pleasant 71 year old woman with a history of nonsustained ventricular tachycardia and frequent PVCs, hypertension, who underwent EP study years ago and had no PVC's to ablate. She has taken multiple different AA drugs in the past including amiodarone, sotalol, rhythmol, flecainide, quinidine but has continued to have symptoms. The patient has worn a cardiac monitor which demonstrated NSVT, PVC's with multiple morphologies, and suprisingly episodes of atrial fibrillation with an IRIR response. Her palpitations have been better. She has gone back to flecainide. She is taking 100 mg bid flecainide. She has had minimal symptomatic atrial fib.  Allergies  Allergen Reactions  . Quinidine Other (See Comments)    REACTION: increased heart rate  . Dofetilide Other (See Comments)    Elevated QTCs  . Amoxicillin Itching and Rash  . Atenolol Hives  . Atorvastatin Other (See Comments)    REACTION: muscles pain  . Latex Other (See Comments)    Raw, rash and blisters - Tape only  . Nadolol Hives     Current Outpatient Prescriptions  Medication Sig Dispense Refill  . acetaminophen (TYLENOL) 500 MG tablet Take 500-1,000 mg by mouth every 6 (six) hours as needed for mild pain or moderate pain.    Marland Kitchen apixaban (ELIQUIS) 5 MG TABS tablet Take 1 tablet (5 mg total) by mouth 2 (two) times daily. 180 tablet 3  . aspirin 81 MG EC tablet Take 81 mg by mouth daily.      . B Complex-C (SUPER B COMPLEX PO) Take 1 capsule by mouth at bedtime.    . Biotin 5 MG CAPS Take 5 mg by mouth at bedtime.     . cetirizine (ZYRTEC) 10 MG tablet Take 10 mg by mouth daily as needed for allergies.     . Coenzyme Q10 (COQ10) 100 MG CAPS Take 100 mg by mouth daily.     Marland Kitchen diltiazem (CARDIZEM LA) 120 MG 24 hr tablet Take 1 tablet (120 mg total) by mouth daily. 90 tablet 3  . escitalopram (LEXAPRO) 20 MG tablet Take 20 mg by mouth daily.      . flecainide (TAMBOCOR) 100 MG  tablet Take 100mg  by mouth twice daily and may take an additional 100 mg daily for increased palpitations 75 tablet 6  . ibuprofen (ADVIL,MOTRIN) 200 MG tablet Take 200 mg by mouth daily as needed (pain).     Marland Kitchen MAGNESIUM PO Take 1 tablet by mouth daily. Magnesium Malate    . MANGANESE PO Take 40 mg by mouth at bedtime.    . Melatonin 10 MG TABS Take 10 mg by mouth at bedtime.     . metroNIDAZOLE (METROCREAM) 0.75 % cream Apply 1 application topically at bedtime.    . mometasone (NASONEX) 50 MCG/ACT nasal spray Place 2 sprays into the nose daily as needed (allergies).     . naproxen sodium (ANAPROX) 220 MG tablet Take 220 mg by mouth as needed (for pain or headache).    . OVER THE COUNTER MEDICATION Take 1,000 mg by mouth daily. Niacin time release OTC    . PRESCRIPTION MEDICATION (Nature Throid) Take 113.75 Monday-Friday and 97.5 on Saturday and Sunday  by mouth    . ranolazine (RANEXA) 500 MG 12 hr tablet Take 1 tablet (500 mg total) by mouth 2 (two) times daily. Start on 06/09/2016 180 tablet 3  . Thiamine Mononitrate (VITAMIN B1 PO) Take 1 tablet by mouth at bedtime.    Marland Kitchen VITAMIN D, CHOLECALCIFEROL, PO  Take 5,000 Units by mouth daily.     No current facility-administered medications for this visit.      Past Medical History:  Diagnosis Date  . Basal cell carcinoma 2001   "forehead, between eyebrows"  . Carcinoma of thyroid gland ([place]) 2001  . Chronic lower back pain    "worse is across my hips" (05/26/2016)  . Depression   . Dyslipidemia   . Fibrocystic breast   . Fibromyalgia   . Heart murmur   . History of blood transfusion 1992   "after subcutaneous mastectomies"  . Hypothyroidism   . Malignant melanoma of left ankle ([place]) 2001  . Migraine    "visual; 2-3 times/year" (05/26/2016)  . Mitral valve prolapse   . Ventricular tachycardia (HCC)    Hx of, controlled on sotalol therapy    ROS:   All systems reviewed and negative except as noted in the HPI.   Past Surgical  History:  Procedure Laterality Date  . ABDOMINAL HYSTERECTOMY  1998  . ANTERIOR CERVICAL DECOMP/DISCECTOMY FUSION  2001  . BACK SURGERY    . BASAL CELL CARCINOMA EXCISION  2001   "cut it out & did a flap, on forehead between my eyebrows"  . BREAST IMPLANT EXCHANGE Bilateral 2001   [id number]  . CARPAL TUNNEL RELEASE Right 2006  . COLONOSCOPY    . DILATION AND CURETTAGE OF UTERUS  1970s X 2-3  . ELECTROPHYSIOLOGIC STUDY  1994 X 2;2001   "to see it it was sustained VT; cause thyroid levels were causing arrhythmias"  . LAPAROSCOPIC CHOLECYSTECTOMY    . MASTECTOMY Bilateral 1992   "subcutaneous"  . MELANOMA EXCISION Left 2001   "ankle, stage I"  . PLACEMENT OF BREAST IMPLANTS Bilateral 1992   [id number]  . TOTAL THYROIDECTOMY  12/1999   "cancer"  . VENTRICULAR ABLATION SURGERY  2011   ventricular tchycardia     Family History  Problem Relation Age of Onset  . Cancer Brother     Skin  . Other Brother     AGENT ORANGE and AntiLupus  . Heart disease Brother     Stents and bypass x2  . Arrhythmia Brother     AFIB  . Heart attack Brother   . Hypertension Brother   . Hyperlipidemia Mother 53  . Osteoporosis Mother   . Heart disease Father 46  . Other Father     Cardiac arrest  . Breast cancer Other   . Thyroid cancer Neg Hx      Social History   Social History  . Marital status: Married    Spouse name: N/A  . Number of children: N/A  . Years of education: N/A   Occupational History  . [employer]   Social History Main Topics  . Smoking status: Never Smoker  . Smokeless tobacco: Never Used  . Alcohol use No  . Drug use: No  . Sexual activity: Yes   Other Topics Concern  . Not on file   Social History Narrative   Married     BP 140/80   Pulse 76   Ht 5\' 5"  (1.651 m)   Wt 150 lb 4 oz (68.2 kg)   SpO2 97%   BMI 25.00 kg/m   Physical Exam:  Well appearing 71 year old woman, NAD HEENT: Unremarkable Neck:  7 cm JVD, no  thyromegally Back:  No CVA tenderness Lungs:  Clear with no wheezes, rales, or rhonchi. HEART:  IRegular rate rhythm, no murmurs, no rubs, no clicks Abd:  soft, positive bowel sounds, no organomegally, no rebound, no guarding Ext:  2 plus pulses, no edema, no cyanosis, no clubbing Skin:  No rashes no nodules Neuro:  CN II through XII intact, motor grossly intact  Cardiac monitor - reviewed and as above.  Assess/Plan: 1. Palpitations - Overall she is improved. I have asked her to take an extra flecainide if she has recurrent and sustained atrial fib.  2. VT - she has had no sustained episodes. See plan as above. She had only a single ectopic beat when I listened to her today. 3. HTN - her blood pressure is controlled. Will follow. 4. PAF - Hopefully she can be controlled on flecainide. She will continue Eliquis. Minimal if any symptoms.  Mikle Bosworth.D.

## 2017-02-15 NOTE — Patient Instructions (Signed)
Medication Instructions:  Your physician recommends that you continue on your current medications as directed. Please refer to the Current Medication list given to you today.    Labwork: None   Testing/Procedures: None   Follow-Up: Your physician wants you to follow-up in: 1 year with Dr Lovena Le (April 2019). You will receive a reminder letter in the mail two months in advance. If you don't receive a letter, please call our office to schedule the follow-up appointment.        If you need a refill on your cardiac medications before your next appointment, please call your pharmacy.

## 2017-02-18 DIAGNOSIS — M542 Cervicalgia: Secondary | ICD-10-CM | POA: Diagnosis not present

## 2017-02-18 DIAGNOSIS — I480 Paroxysmal atrial fibrillation: Secondary | ICD-10-CM | POA: Diagnosis not present

## 2017-03-24 DIAGNOSIS — E039 Hypothyroidism, unspecified: Secondary | ICD-10-CM | POA: Diagnosis not present

## 2017-03-24 DIAGNOSIS — Z79899 Other long term (current) drug therapy: Secondary | ICD-10-CM | POA: Diagnosis not present

## 2017-03-24 DIAGNOSIS — Z6824 Body mass index (BMI) 24.0-24.9, adult: Secondary | ICD-10-CM | POA: Diagnosis not present

## 2017-04-16 ENCOUNTER — Telehealth: Payer: Self-pay | Admitting: Internal Medicine

## 2017-04-16 NOTE — Telephone Encounter (Signed)
°  New Prob  Patient calling the office for samples of medication:   1.  What medication and dosage are you requesting samples for? ranolazine (RANEXA) 500 MG 12 hr tablet apixaban (ELIQUIS) 5 MG TABS tablet  2.  Are you currently out of this medication?  Few weeks left of both medications.

## 2017-04-16 NOTE — Telephone Encounter (Signed)
I have spoke with patient and made her aware that I can place two weeks of samples at the front desk. Patient stated that she is in the donut hole and I made her aware that we cannot supply her with samples for the remainder of the year. She is willing to apply for patient assistance so I will include the forms with the samples.

## 2017-05-06 DIAGNOSIS — R42 Dizziness and giddiness: Secondary | ICD-10-CM | POA: Diagnosis not present

## 2017-05-06 DIAGNOSIS — I480 Paroxysmal atrial fibrillation: Secondary | ICD-10-CM | POA: Diagnosis not present

## 2017-06-30 DIAGNOSIS — I10 Essential (primary) hypertension: Secondary | ICD-10-CM | POA: Diagnosis not present

## 2017-06-30 DIAGNOSIS — E785 Hyperlipidemia, unspecified: Secondary | ICD-10-CM | POA: Diagnosis not present

## 2017-06-30 DIAGNOSIS — E039 Hypothyroidism, unspecified: Secondary | ICD-10-CM | POA: Diagnosis not present

## 2017-06-30 DIAGNOSIS — E559 Vitamin D deficiency, unspecified: Secondary | ICD-10-CM | POA: Diagnosis not present

## 2017-06-30 DIAGNOSIS — F329 Major depressive disorder, single episode, unspecified: Secondary | ICD-10-CM | POA: Diagnosis not present

## 2017-06-30 DIAGNOSIS — I480 Paroxysmal atrial fibrillation: Secondary | ICD-10-CM | POA: Diagnosis not present

## 2017-06-30 DIAGNOSIS — R5381 Other malaise: Secondary | ICD-10-CM | POA: Diagnosis not present

## 2017-07-15 ENCOUNTER — Telehealth: Payer: Self-pay | Admitting: *Deleted

## 2017-07-15 NOTE — Telephone Encounter (Signed)
Patient called and was upset that the rx for diltiazem was sent in incorrectly. She stated that she cannot take tablets and has always received capsules. She read from her bottle diltiazem hcl xr 120 mg caps. I do not see where it has ever been refilled for the capsules. I called [facility] and was informed that they received an e-scribe for the capsules on 05/29/16 from [facility]. They continued to refill patients rx for capsules instead of tablets. Okay to send in new rx for the tablets per patients request? Please advise. Thanks, MI

## 2017-07-16 ENCOUNTER — Other Ambulatory Visit: Payer: Self-pay | Admitting: *Deleted

## 2017-07-16 MED ORDER — DILTIAZEM HCL ER 120 MG PO CP24: 120.0000 mg | ORAL_CAPSULE | Freq: Every day | ORAL | 1 refills | Status: DC

## 2017-07-28 DIAGNOSIS — E039 Hypothyroidism, unspecified: Secondary | ICD-10-CM | POA: Diagnosis not present

## 2017-09-09 DIAGNOSIS — E039 Hypothyroidism, unspecified: Secondary | ICD-10-CM | POA: Diagnosis not present

## 2017-09-09 DIAGNOSIS — Z85850 Personal history of malignant neoplasm of thyroid: Secondary | ICD-10-CM | POA: Diagnosis not present

## 2017-10-07 DIAGNOSIS — Z9181 History of falling: Secondary | ICD-10-CM | POA: Diagnosis not present

## 2017-10-07 DIAGNOSIS — R42 Dizziness and giddiness: Secondary | ICD-10-CM | POA: Diagnosis not present

## 2017-10-07 DIAGNOSIS — Z1331 Encounter for screening for depression: Secondary | ICD-10-CM | POA: Diagnosis not present

## 2017-10-07 DIAGNOSIS — E2839 Other primary ovarian failure: Secondary | ICD-10-CM | POA: Diagnosis not present

## 2017-10-07 DIAGNOSIS — M25551 Pain in right hip: Secondary | ICD-10-CM | POA: Diagnosis not present

## 2017-10-07 DIAGNOSIS — Z23 Encounter for immunization: Secondary | ICD-10-CM | POA: Diagnosis not present

## 2017-10-07 DIAGNOSIS — Z139 Encounter for screening, unspecified: Secondary | ICD-10-CM | POA: Diagnosis not present

## 2017-10-12 DIAGNOSIS — M25552 Pain in left hip: Secondary | ICD-10-CM | POA: Diagnosis not present

## 2017-10-12 DIAGNOSIS — M25551 Pain in right hip: Secondary | ICD-10-CM | POA: Diagnosis not present

## 2017-10-25 DIAGNOSIS — I6523 Occlusion and stenosis of bilateral carotid arteries: Secondary | ICD-10-CM | POA: Diagnosis not present

## 2017-10-25 DIAGNOSIS — M8589 Other specified disorders of bone density and structure, multiple sites: Secondary | ICD-10-CM | POA: Diagnosis not present

## 2017-10-25 DIAGNOSIS — H8149 Vertigo of central origin, unspecified ear: Secondary | ICD-10-CM | POA: Diagnosis not present

## 2017-12-02 DIAGNOSIS — E785 Hyperlipidemia, unspecified: Secondary | ICD-10-CM | POA: Diagnosis not present

## 2017-12-02 DIAGNOSIS — I10 Essential (primary) hypertension: Secondary | ICD-10-CM | POA: Diagnosis not present

## 2017-12-02 DIAGNOSIS — E039 Hypothyroidism, unspecified: Secondary | ICD-10-CM | POA: Diagnosis not present

## 2017-12-02 DIAGNOSIS — I480 Paroxysmal atrial fibrillation: Secondary | ICD-10-CM | POA: Diagnosis not present

## 2017-12-02 DIAGNOSIS — F329 Major depressive disorder, single episode, unspecified: Secondary | ICD-10-CM | POA: Diagnosis not present

## 2017-12-07 DIAGNOSIS — D2239 Melanocytic nevi of other parts of face: Secondary | ICD-10-CM | POA: Diagnosis not present

## 2017-12-07 DIAGNOSIS — Z85820 Personal history of malignant melanoma of skin: Secondary | ICD-10-CM | POA: Diagnosis not present

## 2017-12-07 DIAGNOSIS — L820 Inflamed seborrheic keratosis: Secondary | ICD-10-CM | POA: Diagnosis not present

## 2017-12-07 DIAGNOSIS — L814 Other melanin hyperpigmentation: Secondary | ICD-10-CM | POA: Diagnosis not present

## 2017-12-07 DIAGNOSIS — D225 Melanocytic nevi of trunk: Secondary | ICD-10-CM | POA: Diagnosis not present

## 2018-01-17 ENCOUNTER — Other Ambulatory Visit: Payer: Self-pay | Admitting: Internal Medicine

## 2018-02-14 DIAGNOSIS — S0083XA Contusion of other part of head, initial encounter: Secondary | ICD-10-CM | POA: Diagnosis not present

## 2018-02-14 DIAGNOSIS — S0181XA Laceration without foreign body of other part of head, initial encounter: Secondary | ICD-10-CM | POA: Diagnosis not present

## 2018-02-14 DIAGNOSIS — S8001XA Contusion of right knee, initial encounter: Secondary | ICD-10-CM | POA: Diagnosis not present

## 2018-02-14 DIAGNOSIS — S60222A Contusion of left hand, initial encounter: Secondary | ICD-10-CM | POA: Diagnosis not present

## 2018-02-14 DIAGNOSIS — S60221A Contusion of right hand, initial encounter: Secondary | ICD-10-CM | POA: Diagnosis not present

## 2018-02-15 ENCOUNTER — Encounter: Payer: Self-pay | Admitting: Internal Medicine

## 2018-02-18 ENCOUNTER — Ambulatory Visit: Payer: PPO | Admitting: Internal Medicine

## 2018-02-21 DIAGNOSIS — M542 Cervicalgia: Secondary | ICD-10-CM | POA: Diagnosis not present

## 2018-02-21 DIAGNOSIS — M7989 Other specified soft tissue disorders: Secondary | ICD-10-CM | POA: Diagnosis not present

## 2018-02-21 DIAGNOSIS — S0181XS Laceration without foreign body of other part of head, sequela: Secondary | ICD-10-CM | POA: Diagnosis not present

## 2018-02-21 DIAGNOSIS — S6991XA Unspecified injury of right wrist, hand and finger(s), initial encounter: Secondary | ICD-10-CM | POA: Diagnosis not present

## 2018-02-21 DIAGNOSIS — S0993XA Unspecified injury of face, initial encounter: Secondary | ICD-10-CM | POA: Diagnosis not present

## 2018-02-21 DIAGNOSIS — M25561 Pain in right knee: Secondary | ICD-10-CM | POA: Diagnosis not present

## 2018-02-21 DIAGNOSIS — R6884 Jaw pain: Secondary | ICD-10-CM | POA: Diagnosis not present

## 2018-02-21 DIAGNOSIS — S6992XA Unspecified injury of left wrist, hand and finger(s), initial encounter: Secondary | ICD-10-CM | POA: Diagnosis not present

## 2018-02-21 DIAGNOSIS — M25532 Pain in left wrist: Secondary | ICD-10-CM | POA: Diagnosis not present

## 2018-02-21 DIAGNOSIS — M79641 Pain in right hand: Secondary | ICD-10-CM | POA: Diagnosis not present

## 2018-02-21 DIAGNOSIS — S199XXA Unspecified injury of neck, initial encounter: Secondary | ICD-10-CM | POA: Diagnosis not present

## 2018-02-24 DIAGNOSIS — S60221A Contusion of right hand, initial encounter: Secondary | ICD-10-CM | POA: Diagnosis not present

## 2018-02-24 DIAGNOSIS — S60222A Contusion of left hand, initial encounter: Secondary | ICD-10-CM | POA: Diagnosis not present

## 2018-03-01 ENCOUNTER — Ambulatory Visit: Payer: PPO | Admitting: Internal Medicine

## 2018-03-01 ENCOUNTER — Encounter: Payer: Self-pay | Admitting: Internal Medicine

## 2018-03-01 VITALS — BP 124/72 | HR 70 | Ht 65.0 in | Wt 147.0 lb

## 2018-03-01 DIAGNOSIS — I4729 Other ventricular tachycardia: Secondary | ICD-10-CM

## 2018-03-01 DIAGNOSIS — I472 Ventricular tachycardia: Secondary | ICD-10-CM | POA: Diagnosis not present

## 2018-03-01 DIAGNOSIS — I493 Ventricular premature depolarization: Secondary | ICD-10-CM

## 2018-03-01 DIAGNOSIS — I480 Paroxysmal atrial fibrillation: Secondary | ICD-10-CM | POA: Diagnosis not present

## 2018-03-01 MED ORDER — DILTIAZEM HCL ER 120 MG PO CP24
120.0000 mg | ORAL_CAPSULE | Freq: Every day | ORAL | 3 refills | Status: DC
Start: 2018-03-01 — End: 2018-03-15

## 2018-03-01 MED ORDER — FLECAINIDE ACETATE 100 MG PO TABS: ORAL_TABLET | ORAL | 3 refills | Status: DC

## 2018-03-01 MED ORDER — DILTIAZEM HCL ER 120 MG PO CP24: 120.0000 mg | ORAL_CAPSULE | Freq: Every day | ORAL | 3 refills | Status: DC

## 2018-03-01 MED ORDER — APIXABAN 5 MG PO TABS: 5.0000 mg | ORAL_TABLET | Freq: Two times a day (BID) | ORAL | 3 refills | Status: DC

## 2018-03-01 MED ORDER — RANOLAZINE ER 500 MG PO TB12: 500.0000 mg | ORAL_TABLET | Freq: Two times a day (BID) | ORAL | 3 refills | Status: DC

## 2018-03-01 NOTE — Patient Instructions (Signed)

## 2018-03-01 NOTE — Progress Notes (Signed)
HPI Patricia Alvarado returns today for followup. She is a very pleasant 72 year old woman with a history of nonsustained ventricular tachycardia and frequent PVCs, hypertension, who underwent EP study 2 years ago and had no PVC's to ablate. She has taken multiple different AA drugs in the past including amiodarone, sotalol, rhythmol, flecainide, quinidine but has continued to have symptoms. The patient has worn a cardiac monitor which demonstrated NSVT, PVC's with multiple morphologies, and suprisingly episodes of atrial fibrillation with an IRIR response. Her palpitations have been better. She is still taking flecainide. She is taking 100 mg bid flecainide. She has had to take an extra half dose or two on a couple of occasions. She fell a couple of weeks ago. She did not pass out.   Allergies  Allergen Reactions  . Quinidine Other (See Comments)    REACTION: increased heart rate  . Dofetilide Other (See Comments)    Elevated QTCs  . Amoxicillin Itching and Rash  . Atenolol Hives  . Atorvastatin Other (See Comments)    REACTION: muscles pain  . Latex Other (See Comments)    Raw, rash and blisters - Tape only  . Nadolol Hives     Current Outpatient Medications  Medication Sig Dispense Refill  . acetaminophen (TYLENOL) 500 MG tablet Take 500-1,000 mg by mouth every 6 (six) hours as needed for mild pain or moderate pain.    Marland Kitchen apixaban (ELIQUIS) 5 MG TABS tablet Take 1 tablet (5 mg total) by mouth 2 (two) times daily. 180 tablet 3  . aspirin 81 MG EC tablet Take 81 mg by mouth daily.      . B Complex-C (SUPER B COMPLEX PO) Take 1 capsule by mouth at bedtime.    . Biotin 5 MG CAPS Take 5 mg by mouth at bedtime.     . cetirizine (ZYRTEC) 10 MG tablet Take 10 mg by mouth daily as needed for allergies.     . Coenzyme Q10 (COQ10) 100 MG CAPS Take 100 mg by mouth daily.     Marland Kitchen DILT-XR 120 MG 24 hr capsule TAKE 1 CAPSULE BY MOUTH ONCE DAILY. 90 capsule 0  . escitalopram (LEXAPRO) 20 MG  tablet Take 20 mg by mouth daily.      . flecainide (TAMBOCOR) 100 MG tablet Take 100mg  by mouth twice daily and may take an additional 100 mg daily for increased palpitations 185 tablet 3  . fluticasone (FLONASE) 50 MCG/ACT nasal spray as needed.    Marland Kitchen MAGNESIUM PO Take 1 tablet by mouth daily. Magnesium Malate    . MANGANESE PO Take 40 mg by mouth at bedtime.    . Melatonin 10 MG TABS Take 10 mg by mouth at bedtime.     . metroNIDAZOLE (METROCREAM) 0.75 % cream Apply 1 application topically at bedtime.    Marland Kitchen OVER THE COUNTER MEDICATION Take 1,000 mg by mouth daily. Niacin time release OTC    . PRESCRIPTION MEDICATION (Nature Thyroid) Take 65mg  twice daily    . ranolazine (RANEXA) 500 MG 12 hr tablet Take 1 tablet (500 mg total) by mouth 2 (two) times daily. 180 tablet 3  . Thiamine Mononitrate (VITAMIN B1 PO) Take 1 tablet by mouth at bedtime.    Marland Kitchen VITAMIN D, CHOLECALCIFEROL, PO Take 5,000 Units by mouth daily.     No current facility-administered medications for this visit.      Past Medical History:  Diagnosis Date  . Basal cell carcinoma 2001   "forehead, between  eyebrows"  . Carcinoma of thyroid gland ([place]) 2001  . Chronic lower back pain    "worse is across my hips" (05/26/2016)  . Depression   . Dyslipidemia   . Fibrocystic breast   . Fibromyalgia   . Heart murmur   . History of blood transfusion 1992   "after subcutaneous mastectomies"  . Hypothyroidism   . Malignant melanoma of left ankle ([place]) 2001  . Migraine    "visual; 2-3 times/year" (05/26/2016)  . Mitral valve prolapse   . Ventricular tachycardia (HCC)    Hx of, controlled on sotalol therapy    ROS:   All systems reviewed and negative except as noted in the HPI.   Past Surgical History:  Procedure Laterality Date  . ABDOMINAL HYSTERECTOMY  1998  . ANTERIOR CERVICAL DECOMP/DISCECTOMY FUSION  2001  . BACK SURGERY    . BASAL CELL CARCINOMA EXCISION  2001   "cut it out & did a flap, on forehead between my  eyebrows"  . BREAST IMPLANT EXCHANGE Bilateral 2001   [id number]  . CARPAL TUNNEL RELEASE Right 2006  . COLONOSCOPY    . DILATION AND CURETTAGE OF UTERUS  1970s X 2-3  . ELECTROPHYSIOLOGIC STUDY  1994 X 2;2001   "to see it it was sustained VT; cause thyroid levels were causing arrhythmias"  . LAPAROSCOPIC CHOLECYSTECTOMY    . MASTECTOMY Bilateral 1992   "subcutaneous"  . MELANOMA EXCISION Left 2001   "ankle, stage I"  . PLACEMENT OF BREAST IMPLANTS Bilateral 1992   [id number]  . TOTAL THYROIDECTOMY  12/1999   "cancer"  . VENTRICULAR ABLATION SURGERY  2011   ventricular tchycardia     Family History  Problem Relation Age of Onset  . Cancer Brother        Skin  . Other Brother        AGENT ORANGE and AntiLupus  . Heart disease Brother        Stents and bypass x2  . Arrhythmia Brother        AFIB  . Heart attack Brother   . Hypertension Brother   . Hyperlipidemia Mother 70  . Osteoporosis Mother   . Heart disease Father 75  . Other Father        Cardiac arrest  . Breast cancer Other   . Thyroid cancer Neg Hx      Social History   Socioeconomic History  . Marital status: Married    Spouse name: Not on file  . Number of children: Not on file  . Years of education: Not on file  . Highest education level: Not on file  Occupational History  . Occupation: Financial controller GI    Employer: [employer]  Social Needs  . Financial resource strain: Not on file  . Food insecurity:    Worry: Not on file    Inability: Not on file  . Transportation needs:    Medical: Not on file    Non-medical: Not on file  Tobacco Use  . Smoking status: Never Smoker  . Smokeless tobacco: Never Used  Substance and Sexual Activity  . Alcohol use: No  . Drug use: No  . Sexual activity: Yes  Lifestyle  . Physical activity:    Days per week: Not on file    Minutes per session: Not on file  . Stress: Not on file  Relationships  . Social connections:    Talks on phone: Not on file    Gets  together: Not on file  Attends religious service: Not on file    Active member of club or organization: Not on file    Attends meetings of clubs or organizations: Not on file    Relationship status: Not on file  . Intimate partner violence:    Fear of current or ex partner: Not on file    Emotionally abused: Not on file    Physically abused: Not on file    Forced sexual activity: Not on file  Other Topics Concern  . Not on file  Social History Narrative   Married     BP 124/72   Pulse 70   Ht 5\' 5"  (1.651 m)   Wt 147 lb (66.7 kg)   BMI 24.46 kg/m   Physical Exam:  Well appearing 72 yo woman, NAD HEENT: Unremarkable Neck:  6 cm JVD, no thyromegally Lymphatics:  No adenopathy Back:  No CVA tenderness Lungs:  Clear with no wheezes HEART:  Regular rate rhythm, no murmurs, no rubs, no clicks Abd:  soft, positive bowel sounds, no organomegally, no rebound, no guarding Ext:  2 plus pulses, no edema, no cyanosis, no clubbing Skin:  No rashes no nodules Neuro:  CN II through XII intact, motor grossly intact  EKG - nsr    Assess/Plan: 1. PAF - she is maintaining NSR very nicely on her flecainide. She will continue. 2. PVC"s - her symptoms are well controlled. Continue current meds. She will avoid caffeine 3. HTN - her blood pressure is reasonably well controlled on her calcium channel blocker.  Mikle Bosworth.D.

## 2018-03-02 DIAGNOSIS — R51 Headache: Secondary | ICD-10-CM | POA: Diagnosis not present

## 2018-03-02 DIAGNOSIS — S0291XA Unspecified fracture of skull, initial encounter for closed fracture: Secondary | ICD-10-CM | POA: Diagnosis not present

## 2018-03-11 ENCOUNTER — Telehealth: Payer: Self-pay

## 2018-03-11 DIAGNOSIS — S60222A Contusion of left hand, initial encounter: Secondary | ICD-10-CM | POA: Diagnosis not present

## 2018-03-11 NOTE — Telephone Encounter (Signed)
**Note De-Identified Renatta Shrieves Obfuscation** We received a coverage determination request form for Ranolazine from [facility]. I have completed the form and placed it in Dr Tanna Furry mail bin awaiting his signature.

## 2018-03-15 ENCOUNTER — Other Ambulatory Visit: Payer: Self-pay | Admitting: Internal Medicine

## 2018-03-15 ENCOUNTER — Telehealth: Payer: Self-pay | Admitting: Internal Medicine

## 2018-03-15 DIAGNOSIS — I472 Ventricular tachycardia: Secondary | ICD-10-CM

## 2018-03-15 DIAGNOSIS — I4729 Other ventricular tachycardia: Secondary | ICD-10-CM

## 2018-03-15 MED ORDER — APIXABAN 5 MG PO TABS: 5.0000 mg | ORAL_TABLET | Freq: Two times a day (BID) | ORAL | 1 refills | Status: DC

## 2018-03-15 MED ORDER — FLECAINIDE ACETATE 100 MG PO TABS: ORAL_TABLET | ORAL | 3 refills | Status: DC

## 2018-03-15 MED ORDER — DILTIAZEM HCL ER 120 MG PO CP24: 120.0000 mg | ORAL_CAPSULE | Freq: Every day | ORAL | 3 refills | Status: DC

## 2018-03-15 NOTE — Telephone Encounter (Signed)
**Note De-Identified Patricia Alvarado Obfuscation** I have faxed the completed form back to [facility].

## 2018-03-15 NOTE — Telephone Encounter (Signed)
SCr stable in [facility] labs, ok for refill. Rx sent in.

## 2018-03-15 NOTE — Telephone Encounter (Signed)
**Note De-Identified Kaarin Pardy Obfuscation** I called the number listed and was on hold for more that 30 mins.  I have faxed the pts completed PA form back to [facility]

## 2018-03-15 NOTE — Telephone Encounter (Signed)
New Message:      [facility] is calling about some prior authorization papers they sent over.

## 2018-03-15 NOTE — Telephone Encounter (Signed)
Pt needed Eliquis resent to new pharmacy [facility]. Please address

## 2018-03-16 NOTE — Telephone Encounter (Signed)
**Note De-Identified Patricia Alvarado Obfuscation** We received a letter from [facility] Shahab Polhamus fax stating that the pts Ranolazine coverage determination has been approved. Approval good from 03/15/2018 until 10/25/2018  I have notified the pts pharmacy.

## 2018-03-16 NOTE — Telephone Encounter (Signed)
**Note De-Identified Patricia Alvarado Obfuscation** See 03/11/18 phone note.

## 2018-03-28 ENCOUNTER — Other Ambulatory Visit: Payer: Self-pay

## 2018-03-28 DIAGNOSIS — I4729 Other ventricular tachycardia: Secondary | ICD-10-CM

## 2018-03-28 DIAGNOSIS — I472 Ventricular tachycardia: Secondary | ICD-10-CM

## 2018-03-28 MED ORDER — RANOLAZINE ER 500 MG PO TB12: 500.0000 mg | ORAL_TABLET | Freq: Two times a day (BID) | ORAL | 3 refills | Status: DC

## 2018-03-30 DIAGNOSIS — Z0000 Encounter for general adult medical examination without abnormal findings: Secondary | ICD-10-CM | POA: Diagnosis not present

## 2018-03-30 DIAGNOSIS — Z9181 History of falling: Secondary | ICD-10-CM | POA: Diagnosis not present

## 2018-03-30 DIAGNOSIS — Z136 Encounter for screening for cardiovascular disorders: Secondary | ICD-10-CM | POA: Diagnosis not present

## 2018-03-30 DIAGNOSIS — Z1331 Encounter for screening for depression: Secondary | ICD-10-CM | POA: Diagnosis not present

## 2018-03-30 DIAGNOSIS — E785 Hyperlipidemia, unspecified: Secondary | ICD-10-CM | POA: Diagnosis not present

## 2018-04-04 DIAGNOSIS — M797 Fibromyalgia: Secondary | ICD-10-CM | POA: Diagnosis not present

## 2018-04-04 DIAGNOSIS — I480 Paroxysmal atrial fibrillation: Secondary | ICD-10-CM | POA: Diagnosis not present

## 2018-04-04 DIAGNOSIS — E785 Hyperlipidemia, unspecified: Secondary | ICD-10-CM | POA: Diagnosis not present

## 2018-04-04 DIAGNOSIS — I10 Essential (primary) hypertension: Secondary | ICD-10-CM | POA: Diagnosis not present

## 2018-04-04 DIAGNOSIS — F329 Major depressive disorder, single episode, unspecified: Secondary | ICD-10-CM | POA: Diagnosis not present

## 2018-04-04 DIAGNOSIS — E039 Hypothyroidism, unspecified: Secondary | ICD-10-CM | POA: Diagnosis not present

## 2018-04-04 DIAGNOSIS — Z79899 Other long term (current) drug therapy: Secondary | ICD-10-CM | POA: Diagnosis not present

## 2018-04-14 DIAGNOSIS — H5213 Myopia, bilateral: Secondary | ICD-10-CM | POA: Diagnosis not present

## 2018-04-14 DIAGNOSIS — H524 Presbyopia: Secondary | ICD-10-CM | POA: Diagnosis not present

## 2018-04-14 DIAGNOSIS — H52223 Regular astigmatism, bilateral: Secondary | ICD-10-CM | POA: Diagnosis not present

## 2018-05-30 ENCOUNTER — Telehealth: Payer: Self-pay | Admitting: Internal Medicine

## 2018-05-30 NOTE — Telephone Encounter (Signed)
Did not need this encounter °

## 2018-07-05 DIAGNOSIS — I480 Paroxysmal atrial fibrillation: Secondary | ICD-10-CM | POA: Diagnosis not present

## 2018-07-05 DIAGNOSIS — Z1339 Encounter for screening examination for other mental health and behavioral disorders: Secondary | ICD-10-CM | POA: Diagnosis not present

## 2018-07-05 DIAGNOSIS — Z79899 Other long term (current) drug therapy: Secondary | ICD-10-CM | POA: Diagnosis not present

## 2018-07-05 DIAGNOSIS — E785 Hyperlipidemia, unspecified: Secondary | ICD-10-CM | POA: Diagnosis not present

## 2018-07-05 DIAGNOSIS — E663 Overweight: Secondary | ICD-10-CM | POA: Diagnosis not present

## 2018-07-05 DIAGNOSIS — Z85850 Personal history of malignant neoplasm of thyroid: Secondary | ICD-10-CM | POA: Diagnosis not present

## 2018-07-05 DIAGNOSIS — M797 Fibromyalgia: Secondary | ICD-10-CM | POA: Diagnosis not present

## 2018-07-05 DIAGNOSIS — Z6825 Body mass index (BMI) 25.0-25.9, adult: Secondary | ICD-10-CM | POA: Diagnosis not present

## 2018-07-05 DIAGNOSIS — F329 Major depressive disorder, single episode, unspecified: Secondary | ICD-10-CM | POA: Diagnosis not present

## 2018-07-05 DIAGNOSIS — I10 Essential (primary) hypertension: Secondary | ICD-10-CM | POA: Diagnosis not present

## 2018-07-05 DIAGNOSIS — E039 Hypothyroidism, unspecified: Secondary | ICD-10-CM | POA: Diagnosis not present

## 2018-07-14 DIAGNOSIS — M545 Low back pain: Secondary | ICD-10-CM | POA: Diagnosis not present

## 2018-07-14 DIAGNOSIS — Z6826 Body mass index (BMI) 26.0-26.9, adult: Secondary | ICD-10-CM | POA: Diagnosis not present

## 2018-07-14 DIAGNOSIS — H1132 Conjunctival hemorrhage, left eye: Secondary | ICD-10-CM | POA: Diagnosis not present

## 2018-07-14 DIAGNOSIS — N390 Urinary tract infection, site not specified: Secondary | ICD-10-CM | POA: Diagnosis not present

## 2018-09-06 DIAGNOSIS — Z23 Encounter for immunization: Secondary | ICD-10-CM | POA: Diagnosis not present

## 2018-09-15 ENCOUNTER — Telehealth: Payer: Self-pay | Admitting: Internal Medicine

## 2018-09-15 NOTE — Telephone Encounter (Signed)
New message   Patient c/o Palpitations:  High priority if patient c/o lightheadedness, shortness of breath, or chest pain  1) How long have you had palpitations/irregular HR/ Afib? Are you having the symptoms now?within the last 10 days, no symptoms  2) Are you currently experiencing lightheadedness, SOB or CP? Discomfort in sternum and shoulder blades, lightheadedness   3) Do you have a history of afib (atrial fibrillation) or irregular heart rhythm?yes   4) Have you checked your BP or HR? (document readings if available): 70-82 hr, has not checked bp  When in afib can drop down to 38   5) Are you experiencing any other symptoms? No

## 2018-09-16 NOTE — Telephone Encounter (Signed)
LMTCB

## 2018-09-16 NOTE — Telephone Encounter (Signed)
Call back received from Pt.  Per Pt she is having increased palpitations between 6 - 8 am.  Pt then takes her am pills at 9 am (flecainide and diltiazem).  Spoke with Dr. Lovena Le.  Dr. Lovena Le would like Pt to try taking her Diltiazem at bedtime.  If that doesn't work he advises Pt takes her flecainide earlier in the day.  Pt will check in next Tuesday to discuss how the med time movement is working.

## 2018-09-20 NOTE — Telephone Encounter (Signed)
Pt states her palpitations have improved with taking diltiazem in the evening rather than the morning.  Advised to call if any further needs.

## 2018-10-07 DIAGNOSIS — M797 Fibromyalgia: Secondary | ICD-10-CM | POA: Diagnosis not present

## 2018-10-07 DIAGNOSIS — E039 Hypothyroidism, unspecified: Secondary | ICD-10-CM | POA: Diagnosis not present

## 2018-10-07 DIAGNOSIS — E785 Hyperlipidemia, unspecified: Secondary | ICD-10-CM | POA: Diagnosis not present

## 2018-10-07 DIAGNOSIS — Z6826 Body mass index (BMI) 26.0-26.9, adult: Secondary | ICD-10-CM | POA: Diagnosis not present

## 2018-10-07 DIAGNOSIS — I10 Essential (primary) hypertension: Secondary | ICD-10-CM | POA: Diagnosis not present

## 2018-10-07 DIAGNOSIS — F329 Major depressive disorder, single episode, unspecified: Secondary | ICD-10-CM | POA: Diagnosis not present

## 2018-10-07 DIAGNOSIS — I480 Paroxysmal atrial fibrillation: Secondary | ICD-10-CM | POA: Diagnosis not present

## 2018-12-08 DIAGNOSIS — C44519 Basal cell carcinoma of skin of other part of trunk: Secondary | ICD-10-CM | POA: Diagnosis not present

## 2018-12-08 DIAGNOSIS — D2239 Melanocytic nevi of other parts of face: Secondary | ICD-10-CM | POA: Diagnosis not present

## 2018-12-08 DIAGNOSIS — Z85820 Personal history of malignant melanoma of skin: Secondary | ICD-10-CM | POA: Diagnosis not present

## 2018-12-08 DIAGNOSIS — L814 Other melanin hyperpigmentation: Secondary | ICD-10-CM | POA: Diagnosis not present

## 2018-12-08 DIAGNOSIS — D225 Melanocytic nevi of trunk: Secondary | ICD-10-CM | POA: Diagnosis not present

## 2018-12-08 DIAGNOSIS — D485 Neoplasm of uncertain behavior of skin: Secondary | ICD-10-CM | POA: Diagnosis not present

## 2019-01-11 ENCOUNTER — Other Ambulatory Visit: Payer: Self-pay | Admitting: *Deleted

## 2019-01-11 MED ORDER — DILTIAZEM HCL ER 120 MG PO CP24: 120.0000 mg | ORAL_CAPSULE | Freq: Every day | ORAL | 0 refills | Status: DC

## 2019-01-25 ENCOUNTER — Other Ambulatory Visit: Payer: Self-pay | Admitting: Internal Medicine

## 2019-01-25 DIAGNOSIS — I472 Ventricular tachycardia: Secondary | ICD-10-CM

## 2019-01-25 DIAGNOSIS — I4729 Other ventricular tachycardia: Secondary | ICD-10-CM

## 2019-01-25 NOTE — Telephone Encounter (Signed)
Eliquis 5mg  refill request received; pt is 73 yrs old, wt-66.7kg, Crea-0.93 Dr. Delena Bali at [facility] via [facility] on 10/07/2018, last seen by Dr. Lovena Le on 03/01/2018 & appt on 03/14/2019; will send in a refill.

## 2019-01-26 ENCOUNTER — Other Ambulatory Visit: Payer: Self-pay | Admitting: Internal Medicine

## 2019-01-26 DIAGNOSIS — I472 Ventricular tachycardia: Secondary | ICD-10-CM

## 2019-01-26 DIAGNOSIS — I4729 Other ventricular tachycardia: Secondary | ICD-10-CM

## 2019-02-10 DIAGNOSIS — R7309 Other abnormal glucose: Secondary | ICD-10-CM | POA: Diagnosis not present

## 2019-02-10 DIAGNOSIS — M797 Fibromyalgia: Secondary | ICD-10-CM | POA: Diagnosis not present

## 2019-02-10 DIAGNOSIS — I10 Essential (primary) hypertension: Secondary | ICD-10-CM | POA: Diagnosis not present

## 2019-02-10 DIAGNOSIS — E039 Hypothyroidism, unspecified: Secondary | ICD-10-CM | POA: Diagnosis not present

## 2019-02-10 DIAGNOSIS — I480 Paroxysmal atrial fibrillation: Secondary | ICD-10-CM | POA: Diagnosis not present

## 2019-02-10 DIAGNOSIS — E785 Hyperlipidemia, unspecified: Secondary | ICD-10-CM | POA: Diagnosis not present

## 2019-02-10 DIAGNOSIS — F329 Major depressive disorder, single episode, unspecified: Secondary | ICD-10-CM | POA: Diagnosis not present

## 2019-02-10 DIAGNOSIS — Z6826 Body mass index (BMI) 26.0-26.9, adult: Secondary | ICD-10-CM | POA: Diagnosis not present

## 2019-02-10 DIAGNOSIS — Z139 Encounter for screening, unspecified: Secondary | ICD-10-CM | POA: Diagnosis not present

## 2019-02-10 DIAGNOSIS — E663 Overweight: Secondary | ICD-10-CM | POA: Diagnosis not present

## 2019-03-14 ENCOUNTER — Ambulatory Visit: Payer: PPO | Admitting: Internal Medicine

## 2019-04-04 DIAGNOSIS — Z1331 Encounter for screening for depression: Secondary | ICD-10-CM | POA: Diagnosis not present

## 2019-04-04 DIAGNOSIS — Z1211 Encounter for screening for malignant neoplasm of colon: Secondary | ICD-10-CM | POA: Diagnosis not present

## 2019-04-04 DIAGNOSIS — Z1231 Encounter for screening mammogram for malignant neoplasm of breast: Secondary | ICD-10-CM | POA: Diagnosis not present

## 2019-04-04 DIAGNOSIS — Z0000 Encounter for general adult medical examination without abnormal findings: Secondary | ICD-10-CM | POA: Diagnosis not present

## 2019-04-04 DIAGNOSIS — Z9181 History of falling: Secondary | ICD-10-CM | POA: Diagnosis not present

## 2019-04-04 DIAGNOSIS — E785 Hyperlipidemia, unspecified: Secondary | ICD-10-CM | POA: Diagnosis not present

## 2019-04-04 DIAGNOSIS — N959 Unspecified menopausal and perimenopausal disorder: Secondary | ICD-10-CM | POA: Diagnosis not present

## 2019-05-18 ENCOUNTER — Other Ambulatory Visit: Payer: Self-pay

## 2019-05-18 MED ORDER — DILTIAZEM HCL ER 120 MG PO CP24: 120.0000 mg | ORAL_CAPSULE | Freq: Every day | ORAL | 0 refills | Status: DC

## 2019-05-22 DIAGNOSIS — R21 Rash and other nonspecific skin eruption: Secondary | ICD-10-CM | POA: Diagnosis not present

## 2019-05-22 DIAGNOSIS — Z6827 Body mass index (BMI) 27.0-27.9, adult: Secondary | ICD-10-CM | POA: Diagnosis not present

## 2019-06-13 DIAGNOSIS — Z139 Encounter for screening, unspecified: Secondary | ICD-10-CM | POA: Diagnosis not present

## 2019-06-13 DIAGNOSIS — E663 Overweight: Secondary | ICD-10-CM | POA: Diagnosis not present

## 2019-06-13 DIAGNOSIS — Z6827 Body mass index (BMI) 27.0-27.9, adult: Secondary | ICD-10-CM | POA: Diagnosis not present

## 2019-06-13 DIAGNOSIS — E039 Hypothyroidism, unspecified: Secondary | ICD-10-CM | POA: Diagnosis not present

## 2019-06-13 DIAGNOSIS — G4700 Insomnia, unspecified: Secondary | ICD-10-CM | POA: Diagnosis not present

## 2019-06-13 DIAGNOSIS — F329 Major depressive disorder, single episode, unspecified: Secondary | ICD-10-CM | POA: Diagnosis not present

## 2019-06-13 DIAGNOSIS — I480 Paroxysmal atrial fibrillation: Secondary | ICD-10-CM | POA: Diagnosis not present

## 2019-06-13 DIAGNOSIS — I10 Essential (primary) hypertension: Secondary | ICD-10-CM | POA: Diagnosis not present

## 2019-06-13 DIAGNOSIS — M797 Fibromyalgia: Secondary | ICD-10-CM | POA: Diagnosis not present

## 2019-06-13 DIAGNOSIS — R7309 Other abnormal glucose: Secondary | ICD-10-CM | POA: Diagnosis not present

## 2019-06-13 DIAGNOSIS — E785 Hyperlipidemia, unspecified: Secondary | ICD-10-CM | POA: Diagnosis not present

## 2019-06-16 ENCOUNTER — Telehealth: Payer: Self-pay

## 2019-06-16 NOTE — Telephone Encounter (Signed)
Spoke with pt regarding covid-19 screening prior to appt. Pt stated she has not been in contact with anyone who may have covid-19 and has no symptoms. 

## 2019-06-19 ENCOUNTER — Other Ambulatory Visit: Payer: Self-pay

## 2019-06-19 ENCOUNTER — Encounter: Payer: Self-pay | Admitting: Internal Medicine

## 2019-06-19 ENCOUNTER — Ambulatory Visit: Payer: PPO | Admitting: Internal Medicine

## 2019-06-19 VITALS — BP 148/78 | HR 72 | Ht 65.0 in | Wt 159.0 lb

## 2019-06-19 DIAGNOSIS — I480 Paroxysmal atrial fibrillation: Secondary | ICD-10-CM | POA: Diagnosis not present

## 2019-06-19 NOTE — Progress Notes (Signed)
HPI [name] today for followup. She is a very pleasant 73 year old woman with a history of nonsustained ventricular tachycardia and frequent PVCs, hypertension, who underwent EP study 2 years ago and had no PVC's to ablate. She has taken multiple different AA drugs in the past including amiodarone, sotalol, rhythmol, flecainide, quinidine but has continued to have symptoms. The patient has worn a cardiac monitor which demonstrated NSVT, PVC's with multiple morphologies, and suprisingly episodes of atrial fibrillation with an IRIR response. Her palpitations have been better. She is still taking flecainide. She is taking 100 mg bid flecainide. Allergies  Allergen Reactions  . Quinidine Other (See Comments)    REACTION: increased heart rate  . Dofetilide Other (See Comments)    Elevated QTCs  . Amoxicillin Itching and Rash  . Atenolol Hives  . Atorvastatin Other (See Comments)    REACTION: muscles pain  . Latex Other (See Comments)    Raw, rash and blisters - Tape only  . Nadolol Hives     Current Outpatient Medications  Medication Sig Dispense Refill  . acetaminophen (TYLENOL) 500 MG tablet Take 500-1,000 mg by mouth every 6 (six) hours as needed for mild pain or moderate pain.    Marland Kitchen aspirin 81 MG EC tablet Take 81 mg by mouth daily.      . B Complex-C (SUPER B COMPLEX PO) Take 1 capsule by mouth at bedtime.    . Biotin 5 MG CAPS Take 5 mg by mouth at bedtime.     . cetirizine (ZYRTEC) 10 MG tablet Take 10 mg by mouth daily as needed for allergies.     . Coenzyme Q10 (COQ10) 100 MG CAPS Take 100 mg by mouth daily.     Marland Kitchen diltiazem (DILT-XR) 120 MG 24 hr capsule Take 1 capsule (120 mg total) by mouth daily. Pt must keep upcoming appt for future refills. Thank you 90 capsule 0  . ELIQUIS 5 MG TABS tablet Take 1 tablet by mouth twice a day 180 tablet 1  . escitalopram (LEXAPRO) 20 MG tablet Take 20 mg by mouth daily.      . flecainide (TAMBOCOR) 100 MG tablet Take 100mg   by mouth twice daily and may take an additional 100 mg daily for increased palpitations 225 tablet 3  . fluticasone (FLONASE) 50 MCG/ACT nasal spray as needed.    Marland Kitchen MANGANESE PO Take 40 mg by mouth at bedtime.    . Melatonin 10 MG TABS Take 10 mg by mouth at bedtime.     . metroNIDAZOLE (METROCREAM) 0.75 % cream Apply 1 application topically at bedtime.    Marland Kitchen OVER THE COUNTER MEDICATION Take 1,000 mg by mouth daily. Niacin time release OTC    . PRESCRIPTION MEDICATION (Nature Thyroid) Take 65mg  twice daily    . ranolazine (RANEXA) 500 MG 12 hr tablet Take 1 tablet by mouth twice a day 180 tablet 1  . Thiamine Mononitrate (VITAMIN B1 PO) Take 1 tablet by mouth at bedtime.    Marland Kitchen VITAMIN D, CHOLECALCIFEROL, PO Take 5,000 Units by mouth daily.     No current facility-administered medications for this visit.      Past Medical History:  Diagnosis Date  . Basal cell carcinoma 2001   "forehead, between eyebrows"  . Carcinoma of thyroid gland ([place]) 2001  . Chronic lower back pain    "worse is across my hips" (05/26/2016)  . Depression   . Dyslipidemia   . Fibrocystic breast   . Fibromyalgia   .  Heart murmur   . History of blood transfusion 1992   "after subcutaneous mastectomies"  . Hypothyroidism   . Malignant melanoma of left ankle ([place]) 2001  . Migraine    "visual; 2-3 times/year" (05/26/2016)  . Mitral valve prolapse   . Ventricular tachycardia ([facility])    Hx of, controlled on sotalol therapy    ROS:   All systems reviewed and negative except as noted in the HPI.   Past Surgical History:  Procedure Laterality Date  . ABDOMINAL HYSTERECTOMY  1998  . ANTERIOR CERVICAL DECOMP/DISCECTOMY FUSION  2001  . BACK SURGERY    . BASAL CELL CARCINOMA EXCISION  2001   "cut it out & did a flap, on forehead between my eyebrows"  . BREAST IMPLANT EXCHANGE Bilateral 2001   [id number]  . CARPAL TUNNEL RELEASE Right 2006  . COLONOSCOPY    . DILATION AND CURETTAGE OF UTERUS  1970s X 2-3  .  ELECTROPHYSIOLOGIC STUDY  1994 X 2;2001   "to see it it was sustained VT; cause thyroid levels were causing arrhythmias"  . LAPAROSCOPIC CHOLECYSTECTOMY    . MASTECTOMY Bilateral 1992   "subcutaneous"  . MELANOMA EXCISION Left 2001   "ankle, stage I"  . PLACEMENT OF BREAST IMPLANTS Bilateral 1992   [id number]  . TOTAL THYROIDECTOMY  12/1999   "cancer"  . VENTRICULAR ABLATION SURGERY  2011   ventricular tchycardia     Family History  Problem Relation Age of Onset  . Cancer Brother        Skin  . Other Brother        AGENT ORANGE and AntiLupus  . Heart disease Brother        Stents and bypass x2  . Arrhythmia Brother        AFIB  . Heart attack Brother   . Hypertension Brother   . Hyperlipidemia Mother 53  . Osteoporosis Mother   . Heart disease Father 41  . Other Father        Cardiac arrest  . Breast cancer Other   . Thyroid cancer Neg Hx      Social History   Socioeconomic History  . Marital status: Married    Spouse name: Not on file  . Number of children: Not on file  . Years of education: Not on file  . Highest education level: Not on file  Occupational History  . Occupation: Financial controller GI    Employer: [employer]  Social Needs  . Financial resource strain: Not on file  . Food insecurity    Worry: Not on file    Inability: Not on file  . Transportation needs    Medical: Not on file    Non-medical: Not on file  Tobacco Use  . Smoking status: Never Smoker  . Smokeless tobacco: Never Used  Substance and Sexual Activity  . Alcohol use: No  . Drug use: No  . Sexual activity: Yes  Lifestyle  . Physical activity    Days per week: Not on file    Minutes per session: Not on file  . Stress: Not on file  Relationships  . Social Herbalist on phone: Not on file    Gets together: Not on file    Attends religious service: Not on file    Active member of club or organization: Not on file    Attends meetings of clubs or organizations: Not on file     Relationship status: Not on file  . Intimate  partner violence    Fear of current or ex partner: Not on file    Emotionally abused: Not on file    Physically abused: Not on file    Forced sexual activity: Not on file  Other Topics Concern  . Not on file  Social History Narrative   Married     BP (!) 148/78   Pulse 72   Ht 5\' 5"  (1.651 m)   Wt 159 lb (72.1 kg)   SpO2 97%   BMI 26.46 kg/m   Physical Exam:  Well appearing NAD HEENT: Unremarkable Neck:  No JVD, no thyromegally Lymphatics:  No adenopathy Back:  No CVA tenderness Lungs:  Clear HEART:  Regular rate rhythm, no murmurs, no rubs, no clicks Abd:  soft, positive bowel sounds, no organomegally, no rebound, no guarding Ext:  2 plus pulses, no edema, no cyanosis, no clubbing Skin:  No rashes no nodules Neuro:  CN II through XII intact, motor grossly intact  EKG - nsr   Assess/Plan: 1. PAF - she is maintaining NSR on flecainide. 2. PVC's/NSVT - she will continue flecainide.  3. HTN - her bp is up a little but has been controlled. She is encouraged to maintain a low sodium diet.  I spent 25 minutes including 50% face to face time with the patient  Mikle Bosworth.D.

## 2019-06-19 NOTE — Patient Instructions (Signed)
Medication Instructions:  Your physician recommends that you continue on your current medications as directed. Please refer to the Current Medication list given to you today.  If you need a refill on your cardiac medications before your next appointment, please call your pharmacy.   Lab work: None Ordered   Testing/Procedures: None Ordered   Follow-Up: Your physician recommends that you schedule a follow-up appointment in: 1 year with Dr. Taylor  

## 2019-06-28 ENCOUNTER — Other Ambulatory Visit: Payer: Self-pay

## 2019-06-28 DIAGNOSIS — I4729 Other ventricular tachycardia: Secondary | ICD-10-CM

## 2019-06-28 DIAGNOSIS — I472 Ventricular tachycardia: Secondary | ICD-10-CM

## 2019-06-28 MED ORDER — APIXABAN 5 MG PO TABS: 5.0000 mg | ORAL_TABLET | Freq: Two times a day (BID) | ORAL | 1 refills | Status: DC

## 2019-06-28 NOTE — Telephone Encounter (Signed)
Pt last saw Dr Lovena Le 06/19/19, last labs 02/10/19 Creat 0.88 at [facility] age 73, weight 72.1kg, based on specified criteria pt is on appropriate dosage of Eliquis 5mg  BID.  Will refill rx.

## 2019-07-24 DIAGNOSIS — Z20828 Contact with and (suspected) exposure to other viral communicable diseases: Secondary | ICD-10-CM | POA: Diagnosis not present

## 2019-07-25 ENCOUNTER — Other Ambulatory Visit: Payer: Self-pay | Admitting: Internal Medicine

## 2019-07-25 MED ORDER — DILTIAZEM HCL ER 120 MG PO CP24: 120.0000 mg | ORAL_CAPSULE | Freq: Every day | ORAL | 3 refills | Status: DC

## 2019-07-31 ENCOUNTER — Other Ambulatory Visit: Payer: Self-pay | Admitting: Internal Medicine

## 2019-07-31 DIAGNOSIS — I4729 Other ventricular tachycardia: Secondary | ICD-10-CM

## 2019-07-31 DIAGNOSIS — I472 Ventricular tachycardia: Secondary | ICD-10-CM

## 2019-07-31 MED ORDER — RANOLAZINE ER 500 MG PO TB12: 500.0000 mg | ORAL_TABLET | Freq: Two times a day (BID) | ORAL | 3 refills | Status: DC

## 2019-08-21 DIAGNOSIS — Z23 Encounter for immunization: Secondary | ICD-10-CM | POA: Diagnosis not present

## 2019-08-28 ENCOUNTER — Other Ambulatory Visit: Payer: Self-pay | Admitting: Internal Medicine

## 2019-08-28 DIAGNOSIS — I4729 Other ventricular tachycardia: Secondary | ICD-10-CM

## 2019-08-28 DIAGNOSIS — I472 Ventricular tachycardia: Secondary | ICD-10-CM

## 2019-09-27 ENCOUNTER — Encounter: Payer: Self-pay | Admitting: Gastroenterology

## 2019-10-17 DIAGNOSIS — Z6827 Body mass index (BMI) 27.0-27.9, adult: Secondary | ICD-10-CM | POA: Diagnosis not present

## 2019-10-17 DIAGNOSIS — F329 Major depressive disorder, single episode, unspecified: Secondary | ICD-10-CM | POA: Diagnosis not present

## 2019-10-17 DIAGNOSIS — E039 Hypothyroidism, unspecified: Secondary | ICD-10-CM | POA: Diagnosis not present

## 2019-10-17 DIAGNOSIS — M797 Fibromyalgia: Secondary | ICD-10-CM | POA: Diagnosis not present

## 2019-10-17 DIAGNOSIS — I10 Essential (primary) hypertension: Secondary | ICD-10-CM | POA: Diagnosis not present

## 2019-10-17 DIAGNOSIS — R7309 Other abnormal glucose: Secondary | ICD-10-CM | POA: Diagnosis not present

## 2019-10-17 DIAGNOSIS — E785 Hyperlipidemia, unspecified: Secondary | ICD-10-CM | POA: Diagnosis not present

## 2019-10-17 DIAGNOSIS — G4700 Insomnia, unspecified: Secondary | ICD-10-CM | POA: Diagnosis not present

## 2019-10-17 DIAGNOSIS — I480 Paroxysmal atrial fibrillation: Secondary | ICD-10-CM | POA: Diagnosis not present

## 2019-12-05 DIAGNOSIS — M8589 Other specified disorders of bone density and structure, multiple sites: Secondary | ICD-10-CM | POA: Diagnosis not present

## 2019-12-05 DIAGNOSIS — Z780 Asymptomatic menopausal state: Secondary | ICD-10-CM | POA: Diagnosis not present

## 2019-12-05 DIAGNOSIS — R928 Other abnormal and inconclusive findings on diagnostic imaging of breast: Secondary | ICD-10-CM | POA: Diagnosis not present

## 2019-12-05 DIAGNOSIS — N644 Mastodynia: Secondary | ICD-10-CM | POA: Diagnosis not present

## 2019-12-07 ENCOUNTER — Other Ambulatory Visit: Payer: Self-pay | Admitting: Radiology

## 2019-12-07 DIAGNOSIS — N6453 Retraction of nipple: Secondary | ICD-10-CM

## 2019-12-12 ENCOUNTER — Other Ambulatory Visit: Payer: Self-pay

## 2019-12-12 ENCOUNTER — Ambulatory Visit
Admission: RE | Admit: 2019-12-12 | Discharge: 2019-12-12 | Disposition: A | Discharge status: Home / Self Care | Payer: PPO | Source: Ambulatory Visit | Attending: Radiology | Admitting: Radiology

## 2019-12-12 DIAGNOSIS — N6320 Unspecified lump in the left breast, unspecified quadrant: Secondary | ICD-10-CM | POA: Diagnosis not present

## 2019-12-12 DIAGNOSIS — N6453 Retraction of nipple: Secondary | ICD-10-CM

## 2019-12-12 MED ORDER — GADOBUTROL 1 MMOL/ML IV SOLN
7.0000 mL | Freq: Once | INTRAVENOUS | Status: AC | PRN
  Administered 2019-12-12: 09:00:00 7 mL via INTRAVENOUS

## 2019-12-21 DIAGNOSIS — D225 Melanocytic nevi of trunk: Secondary | ICD-10-CM | POA: Diagnosis not present

## 2019-12-21 DIAGNOSIS — D1801 Hemangioma of skin and subcutaneous tissue: Secondary | ICD-10-CM | POA: Diagnosis not present

## 2019-12-21 DIAGNOSIS — L578 Other skin changes due to chronic exposure to nonionizing radiation: Secondary | ICD-10-CM | POA: Diagnosis not present

## 2019-12-21 DIAGNOSIS — D2239 Melanocytic nevi of other parts of face: Secondary | ICD-10-CM | POA: Diagnosis not present

## 2020-01-01 DIAGNOSIS — N6320 Unspecified lump in the left breast, unspecified quadrant: Secondary | ICD-10-CM | POA: Diagnosis not present

## 2020-01-02 DIAGNOSIS — N6310 Unspecified lump in the right breast, unspecified quadrant: Secondary | ICD-10-CM | POA: Diagnosis not present

## 2020-01-09 ENCOUNTER — Telehealth: Payer: Self-pay | Admitting: *Deleted

## 2020-01-09 DIAGNOSIS — N6310 Unspecified lump in the right breast, unspecified quadrant: Secondary | ICD-10-CM | POA: Diagnosis not present

## 2020-01-09 DIAGNOSIS — Z9889 Other specified postprocedural states: Secondary | ICD-10-CM | POA: Diagnosis not present

## 2020-01-09 DIAGNOSIS — Z01818 Encounter for other preprocedural examination: Secondary | ICD-10-CM

## 2020-01-09 DIAGNOSIS — I480 Paroxysmal atrial fibrillation: Secondary | ICD-10-CM

## 2020-01-09 NOTE — Telephone Encounter (Signed)
Placed call to pt.  Left her a message to call back so we can arrange a bmet & cbc. Will route over to requesting surgeon's office to make them aware.

## 2020-01-09 NOTE — Telephone Encounter (Signed)
Patient with diagnosis of  ATRIAL FIBRILLAITON on ELIQUIS for anticoagulation.    Procedure: BREAST SURGERY Date of procedure: TBD  CHADS2-VASc score of 3 (HTN, AGE, female)   *Patient needs CBC & BMET prior to surgery*  Per office protocol, patient can hold Eliquis for 2 days prior to procedure.  Restart Eliquis on the evening of procedure or day after, at discretion of procedure MD  .

## 2020-01-09 NOTE — Telephone Encounter (Signed)
   [facility] Pre-operative Risk Assessment    Request for surgical clearance:  1. What type of surgery is being performed?  BREAST SURGERY   2. When is this surgery scheduled?  TBD   3. What type of clearance is required (medical clearance vs. Pharmacy clearance to hold med vs. Both)?  PHARMACY  4. Are there any medications that need to be held prior to surgery and how long? ELIQUIS X'S 2 DAYS   5. Practice name and name of physician performing surgery?  [place]   6. What is your office phone number [phone]    7.   What is your office fax number [phone]  8.   Anesthesia type (None, local, MAC, general) ?  GENERAL   Jeanann Lewandowsky 01/09/2020, 11:43 AM  _________________________________________________________________   (provider comments below)

## 2020-01-10 DIAGNOSIS — Z01818 Encounter for other preprocedural examination: Secondary | ICD-10-CM | POA: Diagnosis not present

## 2020-01-10 DIAGNOSIS — I480 Paroxysmal atrial fibrillation: Secondary | ICD-10-CM | POA: Diagnosis not present

## 2020-01-10 NOTE — Telephone Encounter (Signed)
Pt aware she will need labs to be done as her las labs with PCP were 09/2019. Pt would like to have labs done at [facility] in [place]. Orders have been placed for BMET & CBC. Pt will go today 01/10/20 for lab work. Pt thanked me for the call.

## 2020-01-11 LAB — CBC
Hematocrit: 40.6 % (ref 34.0–46.6)
Hemoglobin: 13.8 g/dL (ref 11.1–15.9)
MCH: 31.6 pg (ref 26.6–33.0)
MCHC: 34 g/dL (ref 31.5–35.7)
MCV: 93 fL (ref 79–97)
Platelets: 236 10*3/uL (ref 150–450)
RBC: 4.37 x10E6/uL (ref 3.77–5.28)
RDW: 12.4 % (ref 11.7–15.4)
WBC: 5.8 10*3/uL (ref 3.4–10.8)

## 2020-01-11 LAB — BASIC METABOLIC PANEL
BUN/Creatinine Ratio: 14 (ref 12–28)
BUN: 11 mg/dL (ref 8–27)
CO2: 25 mmol/L (ref 20–29)
Calcium: 10 mg/dL (ref 8.7–10.3)
Chloride: 103 mmol/L (ref 96–106)
Creatinine, Ser: 0.79 mg/dL (ref 0.57–1.00)
GFR calc Af Amer: 86 mL/min/{1.73_m2} (ref >59)
GFR calc non Af Amer: 75 mL/min/{1.73_m2} (ref >59)
Glucose: 106 mg/dL — ABNORMAL HIGH (ref 65–99)
Potassium: 4.5 mmol/L (ref 3.5–5.2)
Sodium: 141 mmol/L (ref 134–144)

## 2020-01-11 NOTE — Telephone Encounter (Signed)
Patricia Alvarado. RMA has reviewed the results with the pt. Pre op team will send clearance to Dr. [facility]. I will remove from the pre op call back pool.

## 2020-01-11 NOTE — Telephone Encounter (Signed)
Normal renal function and electrolytes.   I will route this recommendation to the requesting party via Epic fax function and remove from pre-op pool.  Please call with questions.  [place], [place] 01/11/2020, 10:15 AM

## 2020-01-11 NOTE — Telephone Encounter (Signed)
Labs have been done for pre op clearance. I will send note to pre op team to review labs for pre op.

## 2020-01-11 NOTE — Telephone Encounter (Signed)
Patient crcl is 27ml/min  Patient may hold Eliquis 2 days prior to procedure.

## 2020-01-17 ENCOUNTER — Other Ambulatory Visit: Payer: Self-pay | Admitting: *Deleted

## 2020-01-17 ENCOUNTER — Ambulatory Visit: Payer: Self-pay | Admitting: General Surgery

## 2020-01-17 DIAGNOSIS — I472 Ventricular tachycardia: Secondary | ICD-10-CM

## 2020-01-17 DIAGNOSIS — I4729 Other ventricular tachycardia: Secondary | ICD-10-CM

## 2020-01-17 MED ORDER — APIXABAN 5 MG PO TABS: 5.0000 mg | ORAL_TABLET | Freq: Two times a day (BID) | ORAL | 1 refills | Status: DC

## 2020-01-17 NOTE — Telephone Encounter (Signed)
Eliquis 5mg  refill request received from [facility]. Pt is 74 years old, weight-72.1kg, Crea-0.79 on 01/10/2020, Diagnosis-Afib, and last seen by Dr. Lovena Le on 06/19/2019. Dose is appropriate based on dosing criteria. Will send in refill to requested pharmacy.

## 2020-01-25 ENCOUNTER — Other Ambulatory Visit: Payer: Self-pay

## 2020-01-25 ENCOUNTER — Encounter (HOSPITAL_BASED_OUTPATIENT_CLINIC_OR_DEPARTMENT_OTHER): Payer: Self-pay | Admitting: General Surgery

## 2020-01-31 NOTE — Progress Notes (Signed)
Spoke with patient on the phone about bilateral breast implant removal versus only right side. Confirmed with Patricia Alvarado at Dr. Gwendolyn Fill office that he is going to call patient and confirm surgery of bilat versus right side.

## 2020-02-01 ENCOUNTER — Other Ambulatory Visit (HOSPITAL_COMMUNITY)
Admission: RE | Admit: 2020-02-01 | Discharge: 2020-02-01 | Disposition: A | Discharge status: Home / Self Care | Payer: PPO | Source: Ambulatory Visit | Attending: General Surgery | Admitting: General Surgery

## 2020-02-01 DIAGNOSIS — Z20822 Contact with and (suspected) exposure to covid-19: Secondary | ICD-10-CM | POA: Insufficient documentation

## 2020-02-01 DIAGNOSIS — Z01812 Encounter for preprocedural laboratory examination: Secondary | ICD-10-CM | POA: Diagnosis not present

## 2020-02-01 LAB — SARS CORONAVIRUS 2 (TAT 6-24 HRS): SARS Coronavirus 2: NEGATIVE

## 2020-02-01 NOTE — Progress Notes (Signed)

## 2020-02-05 ENCOUNTER — Ambulatory Visit (HOSPITAL_BASED_OUTPATIENT_CLINIC_OR_DEPARTMENT_OTHER): Payer: PPO | Admitting: Anesthesiology

## 2020-02-05 ENCOUNTER — Encounter (HOSPITAL_BASED_OUTPATIENT_CLINIC_OR_DEPARTMENT_OTHER): Payer: Self-pay | Admitting: General Surgery

## 2020-02-05 ENCOUNTER — Other Ambulatory Visit: Payer: Self-pay

## 2020-02-05 ENCOUNTER — Other Ambulatory Visit: Payer: Self-pay | Admitting: Anatomic Pathology & Clinical Pathology

## 2020-02-05 ENCOUNTER — Encounter (HOSPITAL_BASED_OUTPATIENT_CLINIC_OR_DEPARTMENT_OTHER)
Admission: RE | Disposition: A | Discharge status: Home / Self Care | Payer: Self-pay | Source: Home / Self Care | Attending: General Surgery

## 2020-02-05 ENCOUNTER — Ambulatory Visit (HOSPITAL_BASED_OUTPATIENT_CLINIC_OR_DEPARTMENT_OTHER)
Admission: RE | Admit: 2020-02-05 | Discharge: 2020-02-06 | Disposition: A | Discharge status: Home / Self Care | Payer: PPO | Attending: General Surgery | Admitting: General Surgery

## 2020-02-05 DIAGNOSIS — Z90710 Acquired absence of both cervix and uterus: Secondary | ICD-10-CM | POA: Insufficient documentation

## 2020-02-05 DIAGNOSIS — Z9013 Acquired absence of bilateral breasts and nipples: Secondary | ICD-10-CM | POA: Insufficient documentation

## 2020-02-05 DIAGNOSIS — Z888 Allergy status to other drugs, medicaments and biological substances status: Secondary | ICD-10-CM | POA: Diagnosis not present

## 2020-02-05 DIAGNOSIS — Z8249 Family history of ischemic heart disease and other diseases of the circulatory system: Secondary | ICD-10-CM | POA: Insufficient documentation

## 2020-02-05 DIAGNOSIS — C50111 Malignant neoplasm of central portion of right female breast: Secondary | ICD-10-CM | POA: Diagnosis not present

## 2020-02-05 DIAGNOSIS — Z881 Allergy status to other antibiotic agents status: Secondary | ICD-10-CM | POA: Diagnosis not present

## 2020-02-05 DIAGNOSIS — Z87891 Personal history of nicotine dependence: Secondary | ICD-10-CM | POA: Diagnosis not present

## 2020-02-05 DIAGNOSIS — F329 Major depressive disorder, single episode, unspecified: Secondary | ICD-10-CM | POA: Insufficient documentation

## 2020-02-05 DIAGNOSIS — I4891 Unspecified atrial fibrillation: Secondary | ICD-10-CM | POA: Insufficient documentation

## 2020-02-05 DIAGNOSIS — Z8261 Family history of arthritis: Secondary | ICD-10-CM | POA: Diagnosis not present

## 2020-02-05 DIAGNOSIS — Z880 Allergy status to penicillin: Secondary | ICD-10-CM | POA: Diagnosis not present

## 2020-02-05 DIAGNOSIS — Z79899 Other long term (current) drug therapy: Secondary | ICD-10-CM | POA: Insufficient documentation

## 2020-02-05 DIAGNOSIS — R519 Headache, unspecified: Secondary | ICD-10-CM | POA: Diagnosis not present

## 2020-02-05 DIAGNOSIS — N6310 Unspecified lump in the right breast, unspecified quadrant: Secondary | ICD-10-CM | POA: Diagnosis present

## 2020-02-05 DIAGNOSIS — Z7982 Long term (current) use of aspirin: Secondary | ICD-10-CM | POA: Insufficient documentation

## 2020-02-05 DIAGNOSIS — I499 Cardiac arrhythmia, unspecified: Secondary | ICD-10-CM | POA: Diagnosis not present

## 2020-02-05 DIAGNOSIS — E7800 Pure hypercholesterolemia, unspecified: Secondary | ICD-10-CM | POA: Insufficient documentation

## 2020-02-05 DIAGNOSIS — Z9049 Acquired absence of other specified parts of digestive tract: Secondary | ICD-10-CM | POA: Diagnosis not present

## 2020-02-05 DIAGNOSIS — D0501 Lobular carcinoma in situ of right breast: Secondary | ICD-10-CM | POA: Insufficient documentation

## 2020-02-05 DIAGNOSIS — K219 Gastro-esophageal reflux disease without esophagitis: Secondary | ICD-10-CM | POA: Diagnosis not present

## 2020-02-05 DIAGNOSIS — M1990 Unspecified osteoarthritis, unspecified site: Secondary | ICD-10-CM | POA: Insufficient documentation

## 2020-02-05 DIAGNOSIS — E039 Hypothyroidism, unspecified: Secondary | ICD-10-CM | POA: Diagnosis not present

## 2020-02-05 DIAGNOSIS — R011 Cardiac murmur, unspecified: Secondary | ICD-10-CM | POA: Diagnosis not present

## 2020-02-05 DIAGNOSIS — Z91040 Latex allergy status: Secondary | ICD-10-CM | POA: Diagnosis not present

## 2020-02-05 DIAGNOSIS — C50911 Malignant neoplasm of unspecified site of right female breast: Secondary | ICD-10-CM | POA: Diagnosis not present

## 2020-02-05 DIAGNOSIS — Z7901 Long term (current) use of anticoagulants: Secondary | ICD-10-CM | POA: Insufficient documentation

## 2020-02-05 DIAGNOSIS — M797 Fibromyalgia: Secondary | ICD-10-CM | POA: Insufficient documentation

## 2020-02-05 HISTORY — PX: BREAST IMPLANT REMOVAL: SHX5361

## 2020-02-05 HISTORY — DX: Nausea with vomiting, unspecified: R11.2

## 2020-02-05 HISTORY — PX: BREAST LUMPECTOMY: SHX2

## 2020-02-05 HISTORY — DX: Other specified postprocedural states: Z98.890

## 2020-02-05 SURGERY — BREAST LUMPECTOMY
Anesthesia: General | Site: Breast | Laterality: Right

## 2020-02-05 MED ORDER — HYDROMORPHONE HCL 1 MG/ML IJ SOLN
INTRAMUSCULAR | Status: AC
  Filled 2020-02-05: qty 0.5

## 2020-02-05 MED ORDER — VANCOMYCIN HCL IN DEXTROSE 1-5 GM/200ML-% IV SOLN
INTRAVENOUS | Status: AC
  Filled 2020-02-05: qty 200

## 2020-02-05 MED ORDER — CELECOXIB 200 MG PO CAPS
ORAL_CAPSULE | ORAL | Status: AC
  Filled 2020-02-05: qty 1

## 2020-02-05 MED ORDER — HEPARIN SODIUM (PORCINE) 5000 UNIT/ML IJ SOLN: 5000.0000 [IU] | Freq: Three times a day (TID) | INTRAMUSCULAR | Status: DC

## 2020-02-05 MED ORDER — VANCOMYCIN HCL IN DEXTROSE 1-5 GM/200ML-% IV SOLN
1000.0000 mg | INTRAVENOUS | Status: AC
  Administered 2020-02-05: 1000 mg via INTRAVENOUS

## 2020-02-05 MED ORDER — FENTANYL CITRATE (PF) 100 MCG/2ML IJ SOLN
INTRAMUSCULAR | Status: DC | PRN
  Administered 2020-02-05 (×2): 50 ug via INTRAVENOUS

## 2020-02-05 MED ORDER — GABAPENTIN 300 MG PO CAPS: 300.0000 mg | ORAL_CAPSULE | ORAL | Status: DC

## 2020-02-05 MED ORDER — ONDANSETRON HCL 4 MG/2ML IJ SOLN
INTRAMUSCULAR | Status: DC | PRN
  Administered 2020-02-05: 4 mg via INTRAVENOUS

## 2020-02-05 MED ORDER — FENTANYL CITRATE (PF) 100 MCG/2ML IJ SOLN
INTRAMUSCULAR | Status: AC
  Filled 2020-02-05: qty 2

## 2020-02-05 MED ORDER — FLUTICASONE PROPIONATE 50 MCG/ACT NA SUSP: 2.0000 | Freq: Every day | NASAL | Status: DC

## 2020-02-05 MED ORDER — CHLORHEXIDINE GLUCONATE CLOTH 2 % EX PADS: 6.0000 | MEDICATED_PAD | Freq: Once | CUTANEOUS | Status: DC

## 2020-02-05 MED ORDER — ESCITALOPRAM OXALATE 20 MG PO TABS
20.0000 mg | ORAL_TABLET | Freq: Every day | ORAL | Status: DC
  Administered 2020-02-05: 20 mg via ORAL

## 2020-02-05 MED ORDER — MIDAZOLAM HCL 2 MG/2ML IJ SOLN
INTRAMUSCULAR | Status: AC
  Filled 2020-02-05: qty 2

## 2020-02-05 MED ORDER — PANTOPRAZOLE SODIUM 40 MG IV SOLR: 40.0000 mg | Freq: Every day | INTRAVENOUS | Status: DC

## 2020-02-05 MED ORDER — FLECAINIDE ACETATE 100 MG PO TABS
100.0000 mg | ORAL_TABLET | Freq: Once | ORAL | Status: AC
  Administered 2020-02-05: 100 mg via ORAL

## 2020-02-05 MED ORDER — FENTANYL CITRATE (PF) 100 MCG/2ML IJ SOLN: 50.0000 ug | INTRAMUSCULAR | Status: DC | PRN

## 2020-02-05 MED ORDER — MIDAZOLAM HCL 2 MG/2ML IJ SOLN: 1.0000 mg | INTRAMUSCULAR | Status: DC | PRN

## 2020-02-05 MED ORDER — LORATADINE 10 MG PO TABS: 10.0000 mg | ORAL_TABLET | Freq: Every day | ORAL | Status: DC

## 2020-02-05 MED ORDER — METHOCARBAMOL 1000 MG/10ML IJ SOLN
INTRAMUSCULAR | Status: AC
  Filled 2020-02-05: qty 10

## 2020-02-05 MED ORDER — PHENYLEPHRINE 40 MCG/ML (10ML) SYRINGE FOR IV PUSH (FOR BLOOD PRESSURE SUPPORT)
PREFILLED_SYRINGE | INTRAVENOUS | Status: DC | PRN
  Administered 2020-02-05 (×2): 80 ug via INTRAVENOUS

## 2020-02-05 MED ORDER — ROSUVASTATIN CALCIUM 5 MG PO TABS
5.0000 mg | ORAL_TABLET | Freq: Every day | ORAL | Status: DC
  Administered 2020-02-05: 5 mg via ORAL

## 2020-02-05 MED ORDER — OXYCODONE HCL 5 MG PO TABS: 5.0000 mg | ORAL_TABLET | Freq: Once | ORAL | Status: DC | PRN

## 2020-02-05 MED ORDER — DEXAMETHASONE SODIUM PHOSPHATE 10 MG/ML IJ SOLN
INTRAMUSCULAR | Status: AC
  Filled 2020-02-05: qty 1

## 2020-02-05 MED ORDER — ACETAMINOPHEN 500 MG PO TABS
1000.0000 mg | ORAL_TABLET | ORAL | Status: AC
  Administered 2020-02-05: 1000 mg via ORAL

## 2020-02-05 MED ORDER — HYDROCODONE-ACETAMINOPHEN 5-325 MG PO TABS
1.0000 | ORAL_TABLET | ORAL | Status: DC | PRN
  Administered 2020-02-05 – 2020-02-06 (×3): 1 via ORAL
  Filled 2020-02-05 (×3): qty 1

## 2020-02-05 MED ORDER — PROMETHAZINE HCL 25 MG/ML IJ SOLN: 6.2500 mg | INTRAMUSCULAR | Status: DC | PRN

## 2020-02-05 MED ORDER — LIDOCAINE 2% (20 MG/ML) 5 ML SYRINGE
INTRAMUSCULAR | Status: AC
  Filled 2020-02-05: qty 5

## 2020-02-05 MED ORDER — DEXAMETHASONE SODIUM PHOSPHATE 4 MG/ML IJ SOLN
INTRAMUSCULAR | Status: DC | PRN
  Administered 2020-02-05: 10 mg via INTRAVENOUS

## 2020-02-05 MED ORDER — ACETAMINOPHEN 500 MG PO TABS
ORAL_TABLET | ORAL | Status: AC
  Filled 2020-02-05: qty 2

## 2020-02-05 MED ORDER — METHOCARBAMOL 500 MG PO TABS
500.0000 mg | ORAL_TABLET | Freq: Four times a day (QID) | ORAL | Status: DC | PRN
  Administered 2020-02-05 – 2020-02-06 (×2): 500 mg via ORAL
  Filled 2020-02-05 (×2): qty 1

## 2020-02-05 MED ORDER — METHOCARBAMOL 1000 MG/10ML IJ SOLN
500.0000 mg | Freq: Once | INTRAVENOUS | Status: AC
  Administered 2020-02-05: 13:00:00 500 mg via INTRAVENOUS

## 2020-02-05 MED ORDER — MORPHINE SULFATE (PF) 4 MG/ML IV SOLN: 1.0000 mg | INTRAVENOUS | Status: DC | PRN

## 2020-02-05 MED ORDER — CELECOXIB 200 MG PO CAPS
200.0000 mg | ORAL_CAPSULE | ORAL | Status: AC
  Administered 2020-02-05: 200 mg via ORAL

## 2020-02-05 MED ORDER — KCL IN DEXTROSE-NACL 20-5-0.9 MEQ/L-%-% IV SOLN
INTRAVENOUS | Status: DC
  Filled 2020-02-05: qty 1000

## 2020-02-05 MED ORDER — MIDAZOLAM HCL 2 MG/2ML IJ SOLN
1.0000 mg | INTRAMUSCULAR | Status: DC | PRN
  Administered 2020-02-05: 1 mg via INTRAVENOUS

## 2020-02-05 MED ORDER — EPHEDRINE 5 MG/ML INJ
INTRAVENOUS | Status: AC
  Filled 2020-02-05: qty 10

## 2020-02-05 MED ORDER — LIDOCAINE 2% (20 MG/ML) 5 ML SYRINGE
INTRAMUSCULAR | Status: DC | PRN
  Administered 2020-02-05: 100 mg via INTRAVENOUS

## 2020-02-05 MED ORDER — LACTATED RINGERS IV SOLN: INTRAVENOUS | Status: DC

## 2020-02-05 MED ORDER — ONDANSETRON 4 MG PO TBDP: 4.0000 mg | ORAL_TABLET | Freq: Four times a day (QID) | ORAL | Status: DC | PRN

## 2020-02-05 MED ORDER — PROPOFOL 10 MG/ML IV BOLUS
INTRAVENOUS | Status: DC | PRN
  Administered 2020-02-05: 180 mg via INTRAVENOUS

## 2020-02-05 MED ORDER — DILTIAZEM HCL ER 120 MG PO CP24
120.0000 mg | ORAL_CAPSULE | Freq: Every day | ORAL | Status: DC
  Administered 2020-02-05: 20:00:00 120 mg via ORAL

## 2020-02-05 MED ORDER — ONDANSETRON HCL 4 MG/2ML IJ SOLN
INTRAMUSCULAR | Status: AC
  Filled 2020-02-05: qty 2

## 2020-02-05 MED ORDER — OXYCODONE HCL 5 MG/5ML PO SOLN: 5.0000 mg | Freq: Once | ORAL | Status: DC | PRN

## 2020-02-05 MED ORDER — HYDROMORPHONE HCL 1 MG/ML IJ SOLN
0.2500 mg | INTRAMUSCULAR | Status: DC | PRN
  Administered 2020-02-05 (×2): 0.5 mg via INTRAVENOUS

## 2020-02-05 MED ORDER — GABAPENTIN 300 MG PO CAPS
ORAL_CAPSULE | ORAL | Status: AC
  Filled 2020-02-05: qty 1

## 2020-02-05 MED ORDER — ONDANSETRON HCL 4 MG/2ML IJ SOLN: 4.0000 mg | Freq: Four times a day (QID) | INTRAMUSCULAR | Status: DC | PRN

## 2020-02-05 MED ORDER — EPHEDRINE SULFATE-NACL 50-0.9 MG/10ML-% IV SOSY
PREFILLED_SYRINGE | INTRAVENOUS | Status: DC | PRN
  Administered 2020-02-05 (×3): 10 mg via INTRAVENOUS
  Administered 2020-02-05: 5 mg via INTRAVENOUS
  Administered 2020-02-05: 10 mg via INTRAVENOUS

## 2020-02-05 SURGICAL SUPPLY — 40 items
BINDER BREAST LRG (GAUZE/BANDAGES/DRESSINGS) ×2 IMPLANT
BIOPATCH RED 1 DISK 7.0 (GAUZE/BANDAGES/DRESSINGS) ×1 IMPLANT
BIOPATCH RED 1IN DISK 7.0MM (GAUZE/BANDAGES/DRESSINGS) ×1
BLADE SURG 15 STRL LF DISP TIS (BLADE) ×1 IMPLANT
BLADE SURG 15 STRL SS (BLADE) ×3
CANISTER SUCT 1200ML W/VALVE (MISCELLANEOUS) ×3 IMPLANT
CHLORAPREP W/TINT 26 (MISCELLANEOUS) ×3 IMPLANT
COVER BACK TABLE 60X90IN (DRAPES) ×3 IMPLANT
COVER MAYO STAND STRL (DRAPES) ×3 IMPLANT
DERMABOND ADVANCED (GAUZE/BANDAGES/DRESSINGS) ×2
DERMABOND ADVANCED .7 DNX12 (GAUZE/BANDAGES/DRESSINGS) ×1 IMPLANT
DRAIN CHANNEL 19F RND (DRAIN) ×2 IMPLANT
DRAPE LAPAROSCOPIC ABDOMINAL (DRAPES) ×3 IMPLANT
DRAPE UTILITY XL STRL (DRAPES) ×3 IMPLANT
DRSG PAD ABDOMINAL 8X10 ST (GAUZE/BANDAGES/DRESSINGS) ×2 IMPLANT
ELECT COATED BLADE 2.86 ST (ELECTRODE) ×3 IMPLANT
ELECT REM PT RETURN 9FT ADLT (ELECTROSURGICAL) ×3
ELECTRODE REM PT RTRN 9FT ADLT (ELECTROSURGICAL) ×1 IMPLANT
EVACUATOR SILICONE 100CC (DRAIN) ×2 IMPLANT
GAUZE SPONGE 4X4 12PLY STRL LF (GAUZE/BANDAGES/DRESSINGS) ×2 IMPLANT
GLOVE SURG SS PI 6.5 STRL IVOR (GLOVE) ×2 IMPLANT
GLOVE SURG SS PI 7.5 STRL IVOR (GLOVE) ×2 IMPLANT
GOWN STRL REUS W/ TWL LRG LVL3 (GOWN DISPOSABLE) ×2 IMPLANT
GOWN STRL REUS W/TWL LRG LVL3 (GOWN DISPOSABLE) ×6
KIT MARKER MARGIN INK (KITS) ×2 IMPLANT
NDL HYPO 25X1 1.5 SAFETY (NEEDLE) ×1 IMPLANT
NEEDLE HYPO 25X1 1.5 SAFETY (NEEDLE) ×3 IMPLANT
NS IRRIG 1000ML POUR BTL (IV SOLUTION) ×3 IMPLANT
PACK BASIN DAY SURGERY FS (CUSTOM PROCEDURE TRAY) ×3 IMPLANT
PENCIL SMOKE EVACUATOR (MISCELLANEOUS) ×3 IMPLANT
SLEEVE SCD COMPRESS KNEE MED (MISCELLANEOUS) ×3 IMPLANT
SPONGE LAP 18X18 RF (DISPOSABLE) ×3 IMPLANT
SUT ETHILON 3 0 PS 1 (SUTURE) ×2 IMPLANT
SUT MON AB 4-0 PC3 18 (SUTURE) ×3 IMPLANT
SUT VICRYL 3-0 CR8 SH (SUTURE) ×3 IMPLANT
SYR CONTROL 10ML LL (SYRINGE) ×3 IMPLANT
TOWEL GREEN STERILE FF (TOWEL DISPOSABLE) ×3 IMPLANT
TUBE CONNECTING 20'X1/4 (TUBING) ×1
TUBE CONNECTING 20X1/4 (TUBING) ×2 IMPLANT
YANKAUER SUCT BULB TIP NO VENT (SUCTIONS) ×3 IMPLANT

## 2020-02-05 NOTE — Anesthesia Preprocedure Evaluation (Signed)
Anesthesia Evaluation  Patient identified by MRN, date of birth, ID band Patient awake    Reviewed: Allergy & Precautions, NPO status , Patient's Chart, lab work & pertinent test results  History of Anesthesia Complications (+) PONV  Airway Mallampati: II  TM Distance: >3 FB Neck ROM: Full    Dental no notable dental hx.    Pulmonary neg pulmonary ROS,    Pulmonary exam normal breath sounds clear to auscultation       Cardiovascular negative cardio ROS Normal cardiovascular exam Rhythm:Regular Rate:Normal     Neuro/Psych  Headaches, Depression negative psych ROS   GI/Hepatic negative GI ROS, Neg liver ROS,   Endo/Other  Hypothyroidism   Renal/GU negative Renal ROS  negative genitourinary   Musculoskeletal  (+) Fibromyalgia -  Abdominal   Peds negative pediatric ROS (+)  Hematology negative hematology ROS (+)   Anesthesia Other Findings Breast Cancer  Reproductive/Obstetrics negative OB ROS                             Anesthesia Physical Anesthesia Plan  ASA: III  Anesthesia Plan: General   Post-op Pain Management:    Induction: Intravenous  PONV Risk Score and Plan: 4 or greater and Ondansetron, Dexamethasone, Midazolam, Scopolamine patch - Pre-op and Treatment may vary due to age or medical condition  Airway Management Planned: LMA  Additional Equipment:   Intra-op Plan:   Post-operative Plan: Extubation in OR  Informed Consent: I have reviewed the patients History and Physical, chart, labs and discussed the procedure including the risks, benefits and alternatives for the proposed anesthesia with the patient or authorized representative who has indicated his/her understanding and acceptance.     Dental advisory given  Plan Discussed with: CRNA  Anesthesia Plan Comments:         Anesthesia Quick Evaluation

## 2020-02-05 NOTE — Interval H&P Note (Signed)
History and Physical Interval Note:  02/05/2020 10:36 AM  Patricia Alvarado  has presented today for surgery, with the diagnosis of RIGHT BREAST MASS.  The various methods of treatment have been discussed with the patient and family. After consideration of risks, benefits and other options for treatment, the patient has consented to  Procedure(s): RIGHT BREAST LUMPECTOMY, POSSIBLE IMPLANT REMOVAL, POSSIBLE CAPSULECTOMY (Right) as a surgical intervention.  The patient's history has been reviewed, patient examined, no change in status, stable for surgery.  I have reviewed the patient's chart and labs.  Questions were answered to the patient's satisfaction.     Autumn Messing III

## 2020-02-05 NOTE — Anesthesia Procedure Notes (Signed)
Procedure Name: LMA Insertion Date/Time: 02/05/2020 10:55 AM Performed by: Lieutenant Diego, CRNA Pre-anesthesia Checklist: Patient identified, Emergency Drugs available, Suction available and Patient being monitored Patient Re-evaluated:Patient Re-evaluated prior to induction Oxygen Delivery Method: Circle system utilized Preoxygenation: Pre-oxygenation with 100% oxygen Induction Type: IV induction Ventilation: Mask ventilation without difficulty LMA: LMA inserted LMA Size: 4.0 Number of attempts: 1 Placement Confirmation: positive ETCO2 and breath sounds checked- equal and bilateral Tube secured with: Tape Dental Injury: Teeth and Oropharynx as per pre-operative assessment

## 2020-02-05 NOTE — H&P (Signed)
Patricia Alvarado  Location: [facility] Patient #: [id number] DOB: [birth year] Married / Language: English / Race: White Female   History of Present Illness  The patient is a 74 year old female who presents with a breast mass. We're asked to see the patient in consultation by Dr. Emmit Pomfret to evaluate her for a right nipple mass. The patient is a 74 year old white female who has been noticing a mass at her right nipple for the last year. It seems to be pulling the nipple to the right. There has been tenderness associated with it. This was evaluated by the radiologist and the location was felt to be not amenable to needle biopsy. She does have a history of bilateral nipple sparing mastectomies done in 1992 for high risk disease. She does not smoke. She does have a history of arrhythmia and is on blood thinners and followed by Dr. Lovena Le in cardiology. She also states that she does not want the implants anymore and would like to have both of them removed   Past Surgical History  Breast Biopsy  Bilateral. Gallbladder Surgery - Laparoscopic  Hysterectomy (not due to cancer) - Complete  Sentinel Lymph Node Biopsy  Spinal Surgery - Neck  Thyroid Surgery   Diagnostic Studies History  Colonoscopy  >10 years ago Mammogram  within last year Pap Smear  >5 years ago  Allergies  quiNIDine Gluconate *ANTIARRHYTHMICS*  Dofetilide *ANTIARRHYTHMICS*  Amoxicillin *PENICILLINS*  Atenolol *BETA BLOCKERS*  Atorvastatin Calcium *ANTIHYPERLIPIDEMICS*  Latex  Nadolol *BETA BLOCKERS*  Allergies Reconciled   Medication History Tylenol (500MG  Capsule, Oral) Active. Aspirin (81MG  Tablet, Oral) Active. Biotin (5MG  Tablet, Oral) Active. Crestor (10MG  Tablet, Oral) Active. ZyrTEC (10MG  Tablet Chewable, Oral) Active. CoQ10 (100MG  Capsule, Oral) Active. dilTIAZem CD (120MG  Capsule ER 24HR, Oral) Active. Lexapro (20MG  Tablet, Oral) Active. Flecainide Acetate  (100MG  Tablet, Oral) Active. Flonase (50MCG/DOSE Inhaler, Nasal) Active. Manganese (Oral) Specific strength unknown - Active. Melatonin (10MG  Tablet, Oral) Active. Metronidazole (0.75% Cream, External) Active. Ranexa (500MG  Tablet ER 12HR, Oral) Active. Super B Complex (Oral) Active. Vitamin B1 (100MG  Tablet, Oral) Active. Vital-D Rx (1MG  Tablet, Oral) Active. Eliquis (5MG  Tablet, Oral) Active. Medications Reconciled  Social History Alcohol use  Remotely quit alcohol use. Caffeine use  Tea. Tobacco use  Former smoker.  Family History  Arthritis  Father, Mother. Heart disease in female family member before age 31  Hypertension  Father.  Pregnancy / Birth History  Age at menarche  87 years. Age of menopause  40-50 Contraceptive History  Oral contraceptives. Gravida  3 Length (months) of breastfeeding  7-12 Maternal age  49-25  Other Problems  Arthritis  Atrial Fibrillation  Back Pain  Cholelithiasis  Depression  Gastroesophageal Reflux Disease  Heart murmur  Hypercholesterolemia  Lump In Breast  Melanoma  Oophorectomy  Bilateral. Other disease, cancer, significant illness  Thyroid Cancer  Thyroid Disease  Transfusion history     Review of Systems  General Not Present- Appetite Loss, Chills, Fatigue, Fever, Night Sweats, Weight Gain and Weight Loss. Skin Present- Dryness. Not Present- Change in Wart/Mole, Hives, Jaundice, New Lesions, Non-Healing Wounds, Rash and Ulcer. HEENT Present- Seasonal Allergies and Wears glasses/contact lenses. Not Present- Earache, Hearing Loss, Hoarseness, Nose Bleed, Oral Ulcers, Ringing in the Ears, Sinus Pain, Sore Throat, Visual Disturbances and Yellow Eyes. Breast Present- Breast Mass and Breast Pain. Not Present- Nipple Discharge and Skin Changes. Cardiovascular Present- Palpitations. Not Present- Chest Pain, Difficulty Breathing Lying Down, Leg Cramps, Rapid Heart Rate, Shortness of Breath and  Swelling of Extremities. Gastrointestinal Present- Constipation. Not Present- Abdominal Pain, Bloating, Bloody Stool, Change in Bowel Habits, Chronic diarrhea, Difficulty Swallowing, Excessive gas, Gets full quickly at meals, Hemorrhoids, Indigestion, Nausea, Rectal Pain and Vomiting. Female Genitourinary Present- Frequency. Not Present- Nocturia, Painful Urination, Pelvic Pain and Urgency. Musculoskeletal Present- Back Pain, Joint Pain, Joint Stiffness and Muscle Pain. Not Present- Muscle Weakness and Swelling of Extremities. Neurological Not Present- Decreased Memory, Fainting, Headaches, Numbness, Seizures, Tingling, Tremor, Trouble walking and Weakness. Psychiatric Present- Depression. Not Present- Anxiety, Bipolar, Change in Sleep Pattern, Fearful and Frequent crying. Endocrine Not Present- Cold Intolerance, Excessive Hunger, Hair Changes, Heat Intolerance, Hot flashes and New Diabetes. Hematology Present- Blood Thinners and Easy Bruising. Not Present- Excessive bleeding, Gland problems, HIV and Persistent Infections.  Vitals  Weight: 160.25 lb Height: 64in Body Surface Area: 1.78 m Body Mass Index: 27.51 kg/m  Temp.: 98.62F  Pulse: 87 (Regular)  BP: 124/72 (Sitting, Left Arm, Standard)       Physical Exam  General Mental Status-Alert. General Appearance-Consistent with stated age. Hydration-Well hydrated. Voice-Normal.  Head and Neck Head-normocephalic, atraumatic with no lesions or palpable masses. Trachea-midline. Thyroid Gland Characteristics - normal size and consistency.  Eye Eyeball - Bilateral-Extraocular movements intact. Sclera/Conjunctiva - Bilateral-No scleral icterus.  Chest and Lung Exam Chest and lung exam reveals -quiet, even and easy respiratory effort with no use of accessory muscles and on auscultation, normal breath sounds, no adventitious sounds and normal vocal resonance. Inspection Chest Wall - Normal. Back -  normal.  Breast Note: There is a 1-2 cm palpable mass associated with the nipple and subareolar area of the right breast. There is no other palpable mass in either reconstructed breast. There is no palpable axillary, supraclavicular, or cervical lymphadenopathy.   Cardiovascular Cardiovascular examination reveals -normal heart sounds, regular rate and rhythm with no murmurs and normal pedal pulses bilaterally.  Abdomen Inspection Inspection of the abdomen reveals - No Hernias. Skin - Scar - no surgical scars. Palpation/Percussion Palpation and Percussion of the abdomen reveal - Soft, Non Tender, No Rebound tenderness, No Rigidity (guarding) and No hepatosplenomegaly. Auscultation Auscultation of the abdomen reveals - Bowel sounds normal.  Neurologic Neurologic evaluation reveals -alert and oriented x 3 with no impairment of recent or remote memory. Mental Status-Normal.  Musculoskeletal Normal Exam - Left-Upper Extremity Strength Normal and Lower Extremity Strength Normal. Normal Exam - Right-Upper Extremity Strength Normal and Lower Extremity Strength Normal.  Lymphatic Head & Neck  General Head & Neck Lymphatics: Bilateral - Description - Normal. Axillary  General Axillary Region: Bilateral - Description - Normal. Tenderness - Non Tender. Femoral & Inguinal  Generalized Femoral & Inguinal Lymphatics: Bilateral - Description - Normal. Tenderness - Non Tender.    Assessment & Plan  BREAST MASS, RIGHT (N63.10) Impression: The patient has a new mass involving the right nipple and a history of mastectomy with reconstruction for high-risk disease. At this point it sounds as though a needle biopsy will be impossible given the location of the mass in relation to the implant. Because of this she may need an open surgical biopsy. She has decided that she would like to have the implants removed. If this is a cancer then the implant and capsule would also likely need to be  removed. She would be okay with reexcision mastectomies on both sides with removal of the implant and capsule. We would then be able to get a pathologic diagnosis from that and then decide if any further treatment would be needed. She would  also be interested in reconstruction but understands that if this is a cancer it could require a delayed approach for reconstruction that may take several stages. I have discussed with her in detail the risks and benefits of the operation as well as some of the technical aspects and she understands and wishes to proceed. We will go ahead and refer her to plastic surgery to help with the explantation and possible reconstructive options. We will also get cardiac clearance from Dr. Lovena Le because of her history of arrhythmias and use of blood thinners. This patient encounter took 60 minutes today to perform the following: take history, perform exam, review outside records, interpret imaging, counsel the patient on their diagnosis and document encounter, findings & plan in the EHR Current Plans Referred to Surgery - Plastic, for evaluation and follow up (Plastic Surgery). Routine.  After consultation with Plastics she will only have right central lumpectomy today with removal of implant likely. Once we have a diagnosis then we can plan how to proceed

## 2020-02-05 NOTE — Op Note (Signed)
02/05/2020  12:00 PM  PATIENT:  Patricia Alvarado Check  74 y.o. female  PRE-OPERATIVE DIAGNOSIS:  RIGHT BREAST MASS  POST-OPERATIVE DIAGNOSIS:  RIGHT BREAST MASS  PROCEDURE:  Procedure(s): RIGHT BREAST CENTRAL LUMPECTOMY (Right) REMOVAL RIGHT BREAST IMPLANT AND CAPSULECTOMY (Right)  SURGEON:  Surgeon(s) and Role:    Jovita Kussmaul, MD - Primary  PHYSICIAN ASSISTANT:   ASSISTANTS: none   ANESTHESIA:   general  EBL:  minimal   BLOOD ADMINISTERED:none  DRAINS: (1) Jackson-Pratt drain(s) with closed bulb suction in the subpectoral space   LOCAL MEDICATIONS USED:  MARCAINE     SPECIMEN:  Source of Specimen:  right nipple and areola with mass and capsule with implant  DISPOSITION OF SPECIMEN:  PATHOLOGY  COUNTS:  YES  TOURNIQUET:  * No tourniquets in log *  DICTATION: .Dragon Dictation   After informed consent was obtained the patient was brought to the operating room and placed in the supine position on the operating table.  After adequate induction of general anesthesia the patient's right chest, breast, and axillary area were prepped with ChloraPrep, allowed to dry, and draped in usual sterile manner.  An appropriate timeout was performed.  An elliptical incision was then made with a 15 blade knife around the nipple and areola complex in order to encompass the palpable mass involving the nipple and areola.  The incision was carried through the skin and subcutaneous tissue sharply with the electrocautery until the capsule surrounding the implant was encountered.  The capsule was opened and the implant was removed.  The capsule was then separated from the rest of the subcutaneous tissue and pectoralis muscle and chest wall.  Once this was accomplished then the entire capsule was able to be removed with the central breast tissue and it was all sent en bloc together to pathology for further evaluation after orienting the central portion of the lumpectomy with the appropriate paint colors.   Hemostasis was then achieved using the Bovie electrocautery.  The wound was irrigated with copious amounts of saline.  A small stab incision was then made near the anterior axillary line inferior to the operative bed.  A tonsil clamp was placed through this opening and used to bring a 19 [place] round Blake drain into the operative bed.  The drain was placed in the space behind the muscle.  The muscle was then tacked back down to the inferior chest wall with interrupted 3-0 Vicryl U stitches.  The subcutaneous tissue was then closed with interrupted 3-0 Vicryl stitches.  The skin was then closed with a running 4-0 Monocryl subcuticular stitch.  The drain was placed to bulb suction and there was a good seal.  The rest of the capsule was examined during the dissection and no other abnormalities were noted other than the palpable mass in the central portion of the breast.  Dermabond dressings were applied.  The patient tolerated the procedure well.  At the end of the case all needle sponge and instrument counts were correct.  The patient was then awakened and taken to recovery in stable condition.  PLAN OF CARE: Admit for overnight observation  PATIENT DISPOSITION:  PACU - hemodynamically stable.   Delay start of Pharmacological VTE agent (>24hrs) due to surgical blood loss or risk of bleeding: no

## 2020-02-05 NOTE — Transfer of Care (Signed)
Immediate Anesthesia Transfer of Care Note  Patient: Patricia Alvarado  Procedure(s) Performed: RIGHT BREAST CENTRAL LUMPECTOMY (Right Breast) REMOVAL RIGHT BREAST IMPLANT AND CAPSULECTOMY (Right Breast)  Patient Location: PACU  Anesthesia Type:General  Level of Consciousness: awake  Airway & Oxygen Therapy: Patient Spontanous Breathing and Patient connected to face mask oxygen  Post-op Assessment: Report given to RN and Post -op Vital signs reviewed and stable  Post vital signs: Reviewed and stable  Last Vitals:  Vitals Value Taken Time  BP 149/61 02/05/20 1208  Temp    Pulse 86 02/05/20 1209  Resp 19 02/05/20 1209  SpO2 100 % 02/05/20 1209  Vitals shown include unvalidated device data.  Last Pain:  Vitals:   02/05/20 0947  TempSrc: Oral  PainSc: 0-No pain      Patients Stated Pain Goal: 3 ([id number])  Complications: No apparent anesthesia complications

## 2020-02-06 ENCOUNTER — Encounter: Payer: Self-pay | Admitting: *Deleted

## 2020-02-06 DIAGNOSIS — D0501 Lobular carcinoma in situ of right breast: Secondary | ICD-10-CM | POA: Diagnosis not present

## 2020-02-06 MED ORDER — HYDROCODONE-ACETAMINOPHEN 5-325 MG PO TABS: 1.0000 | ORAL_TABLET | Freq: Four times a day (QID) | ORAL | 0 refills | Status: DC | PRN

## 2020-02-06 NOTE — Anesthesia Postprocedure Evaluation (Signed)
Anesthesia Post Note  Patient: Patricia Alvarado  Procedure(s) Performed: RIGHT BREAST CENTRAL LUMPECTOMY (Right Breast) REMOVAL RIGHT BREAST IMPLANT AND CAPSULECTOMY (Right Breast)     Patient location during evaluation: PACU Anesthesia Type: General Level of consciousness: awake and alert Pain management: pain level controlled Vital Signs Assessment: post-procedure vital signs reviewed and stable Respiratory status: spontaneous breathing, nonlabored ventilation and respiratory function stable Cardiovascular status: blood pressure returned to baseline and stable Postop Assessment: no apparent nausea or vomiting Anesthetic complications: no    Last Vitals:  Vitals:   02/06/20 0545 02/06/20 0645  BP:    Pulse: 69 72  Resp:    Temp:    SpO2: 97% 97%    Last Pain:  Vitals:   02/06/20 0645  TempSrc:   PainSc: 0-No pain                 Lynda Rainwater

## 2020-02-06 NOTE — Discharge Summary (Signed)
Physician Discharge Summary  Patient ID: Patricia Alvarado MRN: [id number] DOB/AGE: [birth year] 74 y.o.  Admit date: 02/05/2020 Discharge date: 02/06/2020  Admission Diagnoses:  Discharge Diagnoses:  Active Problems:   Breast mass, right   Discharged Condition: good  Hospital Course: the patient underwent central lumpectomy with removal of implant and capsulectomy.  She tolerated the surgery well.  On postop day 1 she was ready for discharge home  Consults: None  Significant Diagnostic Studies: none  Treatments: surgery: as above  Discharge Exam: Blood pressure (!) 147/54, pulse (P) 73, temperature 97.7 F (36.5 C), resp. rate 18, height 5' 4.5" (1.638 m), weight 71.7 kg, SpO2 (P) 95 %. Chest wall: skin flaps look good  Disposition: Discharge disposition: 01-Home or Self Care       Discharge Instructions    Call MD for:  difficulty breathing, headache or visual disturbances   Complete by: As directed    Call MD for:  extreme fatigue   Complete by: As directed    Call MD for:  hives   Complete by: As directed    Call MD for:  persistant dizziness or light-headedness   Complete by: As directed    Call MD for:  persistant nausea and vomiting   Complete by: As directed    Call MD for:  redness, tenderness, or signs of infection (pain, swelling, redness, odor or green/yellow discharge around incision site)   Complete by: As directed    Call MD for:  severe uncontrolled pain   Complete by: As directed    Call MD for:  temperature >100.4   Complete by: As directed    Diet - low sodium heart healthy   Complete by: As directed    Discharge instructions   Complete by: As directed    Sponge bathe while drains are in. No overhead activity with right arm. Wear binder. Empty drain, record output, recharge bulb twice a day   Increase activity slowly   Complete by: As directed    No wound care   Complete by: As directed      Allergies as of 02/06/2020      Reactions   Quinidine Other (See Comments)   REACTION: increased heart rate   Dofetilide Other (See Comments)   Elevated QTCs   Amoxicillin Itching, Rash   Atenolol Hives   Atorvastatin Other (See Comments)   REACTION: muscles pain   Latex Other (See Comments)   Raw, rash and blisters - Tape only   Nadolol Hives      Medication List    TAKE these medications   acetaminophen 500 MG tablet Commonly known as: TYLENOL Take 500-1,000 mg by mouth every 6 (six) hours as needed for mild pain or moderate pain.   apixaban 5 MG Tabs tablet Commonly known as: Eliquis Take 1 tablet (5 mg total) by mouth 2 (two) times daily.   aspirin 81 MG EC tablet Take 81 mg by mouth daily.   Biotin 5 MG Caps Take 5 mg by mouth at bedtime.   cetirizine 10 MG tablet Commonly known as: ZYRTEC Take 10 mg by mouth daily as needed for allergies.   CoQ10 100 MG Caps Take 100 mg by mouth daily.   CRESTOR PO Take by mouth.   diltiazem 120 MG 24 hr capsule Commonly known as: Dilt-XR Take 1 capsule (120 mg total) by mouth daily.   escitalopram 20 MG tablet Commonly known as: LEXAPRO Take 20 mg by mouth daily.   flecainide 100 MG  tablet Commonly known as: TAMBOCOR Take 1 tablet by mouth twice a day and may take an additional tablet daily for increased palpitations   fluticasone 50 MCG/ACT nasal spray Commonly known as: FLONASE as needed.   HYDROcodone-acetaminophen 5-325 MG tablet Commonly known as: NORCO/VICODIN Take 1-2 tablets by mouth every 6 (six) hours as needed for moderate pain.   MANGANESE PO Take 40 mg by mouth at bedtime.   Melatonin 10 MG Tabs Take 10 mg by mouth at bedtime.   OVER THE COUNTER MEDICATION Take 1,000 mg by mouth daily. Niacin time release OTC   PRESCRIPTION MEDICATION (Nature Thyroid) Take 65mg  twice daily   ranolazine 500 MG 12 hr tablet Commonly known as: RANEXA Take 1 tablet (500 mg total) by mouth 2 (two) times daily.   SUPER B COMPLEX PO Take 1 capsule by  mouth at bedtime.   VITAMIN B1 PO Take 1 tablet by mouth at bedtime.   VITAMIN D (CHOLECALCIFEROL) PO Take 5,000 Units by mouth daily.      Follow-up Information    Autumn Messing III, MD In 2 weeks.   Specialty: General Surgery Contact information: [address] [place] [address] [phone]           Signed: Autumn Messing III 02/06/2020, 12:14 PM

## 2020-02-06 NOTE — Progress Notes (Signed)
1 Day Post-Op   Subjective/Chief Complaint: No complaints   Objective: Vital signs in last 24 hours: Temp:  [97.1 F (36.2 C)-98.2 F (36.8 C)] 97.7 F (36.5 C) (04/13 0445) Pulse Rate:  [67-88] 69 (04/13 0730) Resp:  [10-20] 18 (04/13 0445) BP: (132-157)/(53-69) 147/54 (04/13 0445) SpO2:  [92 %-100 %] 95 % (04/13 0730) Weight:  [71.7 kg] 71.7 kg (04/12 0947)    Intake/Output from previous day: 04/12 0701 - 04/13 0700 In: 1795.7 [P.O.:652; I.V.:1143.7] Out: 2050 [id number]; Drains:100] Intake/Output this shift: No intake/output data recorded.  General appearance: alert and cooperative Resp: clear to auscultation bilaterally Chest wall: skin flaps look good Cardio: regular rate and rhythm GI: soft, non-tender; bowel sounds normal; no masses,  no organomegaly  Lab Results:  No results for input(s): WBC, HGB, HCT, PLT in the last 72 hours. BMET No results for input(s): NA, K, CL, CO2, GLUCOSE, BUN, CREATININE, CALCIUM in the last 72 hours. PT/INR No results for input(s): LABPROT, INR in the last 72 hours. ABG No results for input(s): PHART, HCO3 in the last 72 hours.  Invalid input(s): PCO2, PO2  Studies/Results: No results found.  Anti-infectives: Anti-infectives (From admission, onward)   Start     Dose/Rate Route Frequency Ordered Stop   02/05/20 0915  vancomycin (VANCOCIN) IVPB 1000 mg/200 mL premix     1,000 mg 200 mL/hr over 60 Minutes Intravenous On call to O.R. 02/05/20 0906 02/05/20 1056      Assessment/Plan: s/p Procedure(s): RIGHT BREAST CENTRAL LUMPECTOMY (Right) REMOVAL RIGHT BREAST IMPLANT AND CAPSULECTOMY (Right) Advance diet Discharge  LOS: 0 days    Autumn Messing III 02/06/2020

## 2020-02-06 NOTE — Discharge Instructions (Signed)

## 2020-02-14 ENCOUNTER — Telehealth: Payer: Self-pay | Admitting: Hematology and Oncology

## 2020-02-14 LAB — SURGICAL PATHOLOGY

## 2020-02-14 NOTE — Telephone Encounter (Signed)
Received a new pt referral from Dr. Marlou Starks at [facility] for dx of breast cancer. Patricia Alvarado has been cld and scheduled to see Dr. Lindi Adie on 4/22 at 3:15pm. Pt aware to arrive 15 minutes early.

## 2020-02-14 NOTE — Progress Notes (Signed)
[facility] NOTE  Patient Care Team: Nicoletta Dress, MD as PCP - General (Internal Medicine) Mauro Kaufmann, RN as Oncology Nurse Navigator Rockwell Germany, RN as Oncology Nurse Navigator Nicholas Lose, MD as Medical Oncologist (Hematology and Oncology)  CHIEF COMPLAINTS/PURPOSE OF CONSULTATION:  Newly diagnosed breast cancer  HISTORY OF PRESENTING ILLNESS:  Patricia Alvarado 74 y.o. female is here because of recent diagnosis of left breast cancer. She has a history of bilateral mastectomy with implant reconstruction in 1992 for high risk disease. She noted right nipple changes. Breast MRI on 12/12/19 showed a 1.6cm mass involving the right nipple not amenable to biopsy, a 0.7cm mass along the left medial chest wall, and prominent internal mammary chain lymph nodes bilaterally. She underwent a right lumpectomy and implant removal on 02/05/20 with Dr. Marlou Starks for which pathology showed invasive lobular carcinoma, grade 2, 2.0cm, with LCIS. She presents to the clinic today for initial evaluation and discussion of treatment options.    I reviewed her records extensively and collaborated the history with the patient.  SUMMARY OF ONCOLOGIC HISTORY: Oncology History  Malignant neoplasm of central portion of right breast in female, estrogen receptor positive ([facility])  12/11/2018 Initial Diagnosis   Palpable right nipple mass, breast MRI revealed a 1.6 cm mass, internal mammary lymphadenopathy was noted, biopsy could not be performed because of the location.  Prior history of bilateral mastectomies for high risk disease.   02/05/2020 Surgery   Right central lumpectomy with implant: Grade 2 invasive lobular carcinoma, 2 cm, LCIS, lymphovascular invasion identified, perineural invasion identified, ER 100%, PR 100%, HER-2 negative, Ki-67 1%     MEDICAL HISTORY:  Past Medical History:  Diagnosis Date  . Basal cell carcinoma 2001   "forehead, between eyebrows"  . Carcinoma of  thyroid gland ([facility]) 2001  . Chronic lower back pain    "worse is across my hips" (05/26/2016)  . Depression   . Dyslipidemia   . Fibrocystic breast   . Fibromyalgia   . Heart murmur   . History of blood transfusion 1992   "after subcutaneous mastectomies"  . Hypothyroidism   . Malignant melanoma of left ankle ([facility]) 2001  . Migraine    "visual; 2-3 times/year" (05/26/2016)  . Mitral valve prolapse   . PONV (postoperative nausea and vomiting)   . Ventricular tachycardia ([facility])    Hx of, controlled on sotalol therapy    SURGICAL HISTORY: Past Surgical History:  Procedure Laterality Date  . ABDOMINAL HYSTERECTOMY  1998  . ANTERIOR CERVICAL DECOMP/DISCECTOMY FUSION  2001  . BACK SURGERY    . BASAL CELL CARCINOMA EXCISION  2001   "cut it out & did a flap, on forehead between my eyebrows"  . BREAST IMPLANT EXCHANGE Bilateral 2001   [id number]  . BREAST IMPLANT REMOVAL Right 02/05/2020   Procedure: REMOVAL RIGHT BREAST IMPLANT AND CAPSULECTOMY;  Surgeon: Jovita Kussmaul, MD;  Location: [facility];  Service: General;  Laterality: Right;  . BREAST LUMPECTOMY Right 02/05/2020   Procedure: RIGHT BREAST CENTRAL LUMPECTOMY;  Surgeon: Jovita Kussmaul, MD;  Location: [facility];  Service: General;  Laterality: Right;  . CARPAL TUNNEL RELEASE Right 2006  . COLONOSCOPY    . DILATION AND CURETTAGE OF UTERUS  1970s X 2-3  . ELECTROPHYSIOLOGIC STUDY  1994 X 2;2001   "to see it it was sustained VT; cause thyroid levels were causing arrhythmias"  . LAPAROSCOPIC CHOLECYSTECTOMY    . MASTECTOMY Bilateral 1992   "  subcutaneous"  . MELANOMA EXCISION Left 2001   "ankle, stage I"  . PLACEMENT OF BREAST IMPLANTS Bilateral 1992   [id number]  . TOTAL THYROIDECTOMY  12/1999   "cancer"  . VENTRICULAR ABLATION SURGERY  2011   ventricular tchycardia    SOCIAL HISTORY: Social History   Socioeconomic History  . Marital status: Married    Spouse name: Not on file  . Number of  children: Not on file  . Years of education: Not on file  . Highest education level: Not on file  Occupational History  . Occupation: Financial controller GI    Employer: [employer]  Tobacco Use  . Smoking status: Never Smoker  . Smokeless tobacco: Never Used  Substance and Sexual Activity  . Alcohol use: No  . Drug use: No  . Sexual activity: Yes  Other Topics Concern  . Not on file  Social History Narrative   Married   Social Determinants of Health   Financial Resource Strain:   . Difficulty of Paying Living Expenses:   Food Insecurity:   . Worried About Charity fundraiser in the Last Year:   . Arboriculturist in the Last Year:   Transportation Needs:   . Film/video editor (Medical):   Marland Kitchen Lack of Transportation (Non-Medical):   Physical Activity:   . Days of Exercise per Week:   . Minutes of Exercise per Session:   Stress:   . Feeling of Stress :   Social Connections:   . Frequency of Communication with Friends and Family:   . Frequency of Social Gatherings with Friends and Family:   . Attends Religious Services:   . Active Member of Clubs or Organizations:   . Attends Archivist Meetings:   Marland Kitchen Marital Status:   Intimate Partner Violence:   . Fear of Current or Ex-Partner:   . Emotionally Abused:   Marland Kitchen Physically Abused:   . Sexually Abused:     FAMILY HISTORY: Family History  Problem Relation Age of Onset  . Cancer Brother        Skin  . Other Brother        AGENT ORANGE and AntiLupus  . Heart disease Brother        Stents and bypass x2  . Arrhythmia Brother        AFIB  . Heart attack Brother   . Hypertension Brother   . Hyperlipidemia Mother 8  . Osteoporosis Mother   . Heart disease Father 62  . Other Father        Cardiac arrest  . Breast cancer Other   . Thyroid cancer Neg Hx     ALLERGIES:  is allergic to quinidine; dofetilide; amoxicillin; atenolol; atorvastatin; latex; and nadolol.  MEDICATIONS:  Current Outpatient Medications    Medication Sig Dispense Refill  . acetaminophen (TYLENOL) 500 MG tablet Take 500-1,000 mg by mouth every 6 (six) hours as needed for mild pain or moderate pain.    Marland Kitchen apixaban (ELIQUIS) 5 MG TABS tablet Take 1 tablet (5 mg total) by mouth 2 (two) times daily. 180 tablet 1  . aspirin 81 MG EC tablet Take 81 mg by mouth daily.      . B Complex-C (SUPER B COMPLEX PO) Take 1 capsule by mouth at bedtime.    . Biotin 5 MG CAPS Take 5 mg by mouth at bedtime.     . cetirizine (ZYRTEC) 10 MG tablet Take 10 mg by mouth daily as needed for allergies.     Marland Kitchen  Coenzyme Q10 (COQ10) 100 MG CAPS Take 100 mg by mouth daily.     Marland Kitchen diltiazem (DILT-XR) 120 MG 24 hr capsule Take 1 capsule (120 mg total) by mouth daily. 90 capsule 3  . escitalopram (LEXAPRO) 20 MG tablet Take 20 mg by mouth daily.      . flecainide (TAMBOCOR) 100 MG tablet Take 1 tablet by mouth twice a day and may take an additional tablet daily for increased palpitations 225 tablet 2  . fluticasone (FLONASE) 50 MCG/ACT nasal spray as needed.    Marland Kitchen HYDROcodone-acetaminophen (NORCO/VICODIN) 5-325 MG tablet Take 1-2 tablets by mouth every 6 (six) hours as needed for moderate pain. 15 tablet 0  . MANGANESE PO Take 40 mg by mouth at bedtime.    . Melatonin 10 MG TABS Take 10 mg by mouth at bedtime.     Marland Kitchen OVER THE COUNTER MEDICATION Take 1,000 mg by mouth daily. Niacin time release OTC    . PRESCRIPTION MEDICATION (Nature Thyroid) Take '65mg'$  twice daily    . ranolazine (RANEXA) 500 MG 12 hr tablet Take 1 tablet (500 mg total) by mouth 2 (two) times daily. 180 tablet 3  . Rosuvastatin Calcium (CRESTOR PO) Take by mouth.    . Thiamine Mononitrate (VITAMIN B1 PO) Take 1 tablet by mouth at bedtime.    Marland Kitchen VITAMIN D, CHOLECALCIFEROL, PO Take 5,000 Units by mouth daily.     No current facility-administered medications for this visit.    REVIEW OF SYSTEMS:   Constitutional: Denies fevers, chills or abnormal night sweats Eyes: Denies blurriness of vision,  double vision or watery eyes Ears, nose, mouth, throat, and face: Denies mucositis or sore throat Respiratory: Denies cough, dyspnea or wheezes Cardiovascular: Denies palpitation, chest discomfort or lower extremity swelling Gastrointestinal:  Denies nausea, heartburn or change in bowel habits Skin: Denies abnormal skin rashes Lymphatics: Denies new lymphadenopathy or easy bruising Neurological:Denies numbness, tingling or new weaknesses Behavioral/Psych: Mood is stable, no new changes  Breast: s/p right lumpectomy  All other systems were reviewed with the patient and are negative.  PHYSICAL EXAMINATION: ECOG PERFORMANCE STATUS: 1 - Symptomatic but completely ambulatory  Vitals:   02/15/20 1459  BP: 132/80  Pulse: 74  Resp: 18  Temp: 98.2 F (36.8 C)  SpO2: 100%   Filed Weights   02/15/20 1459  Weight: 159 lb 9.6 oz (72.4 kg)    GENERAL:alert, no distress and comfortable SKIN: skin color, texture, turgor are normal, no rashes or significant lesions EYES: normal, conjunctiva are pink and non-injected, sclera clear OROPHARYNX:no exudate, no erythema and lips, buccal mucosa, and tongue normal  NECK: supple, thyroid normal size, non-tender, without nodularity LYMPH:  no palpable lymphadenopathy in the cervical, axillary or inguinal LUNGS: clear to auscultation and percussion with normal breathing effort HEART: regular rate & rhythm and no murmurs and no lower extremity edema ABDOMEN:abdomen soft, non-tender and normal bowel sounds Musculoskeletal:no cyanosis of digits and no clubbing  PSYCH: alert & oriented x 3 with fluent speech NEURO: no focal motor/sensory deficits     LABORATORY DATA:  I have reviewed the data as listed Lab Results  Component Value Date   WBC 5.8 01/10/2020   HGB 13.8 01/10/2020   HCT 40.6 01/10/2020   MCV 93 01/10/2020   PLT 236 01/10/2020   Lab Results  Component Value Date   NA 141 01/10/2020   K 4.5 01/10/2020   CL 103 01/10/2020   CO2  25 01/10/2020    RADIOGRAPHIC STUDIES: I have  personally reviewed the radiological reports and agreed with the findings in the report.  ASSESSMENT AND PLAN:  Malignant neoplasm of central portion of right breast in female, estrogen receptor positive (H[facility]) Palpable right nipple mass, breast MRI revealed a 1.6 cm mass, internal mammary lymphadenopathy was noted, biopsy could not be performed because of the location.  Prior history of bilateral nipple sparing mastectomies with reconstruction for high risk disease.  02/05/2020:Right central lumpectomy with implant: Grade 2 invasive lobular carcinoma, 2 cm, LCIS, lymphovascular invasion identified, perineural invasion identified, ER 100%, PR 100%, HER-2 negative, Ki-67 1%  Pathology and radiology counseling: Discussed with the patient, the details of pathology including the type of breast cancer,the clinical staging, the significance of ER, PR and HER-2/neu receptors and the implications for treatment. After reviewing the pathology in detail, we proceeded to discuss the different treatment options.  Treatment plan: 1.  MRI did not show anything in the axilla.  The internal mammary lymph nodes are also concerning.  We would like to get [facility] a PET scan to evaluate this further. 2.  Tumor board discussion regarding the role of radiation 3.  Adjuvant antiestrogen therapy with anastrozole 1 mg daily x7 years  Anastrozole counseling: We discussed the risks and benefits of anti-estrogen therapy with aromatase inhibitors. These include but not limited to insomnia, hot flashes, mood changes, vaginal dryness, bone density loss, and weight gain. We strongly believe that the benefits far outweigh the risks. Patient understands these risks and consented to starting treatment. Planned treatment duration is 7 years.  Return to clinic in 3 months for survivorship care plan visit    All questions were answered. The patient knows to call the clinic with any  problems, questions or concerns.   Rulon Eisenmenger, MD, MPH 02/15/2020    I, Molly Dorshimer, am acting as scribe for Nicholas Lose, MD.  I have reviewed the above documentation for accuracy and completeness, and I agree with the above.

## 2020-02-15 ENCOUNTER — Other Ambulatory Visit: Payer: Self-pay

## 2020-02-15 ENCOUNTER — Inpatient Hospital Stay: Payer: PPO | Attending: Hematology and Oncology | Admitting: Hematology and Oncology

## 2020-02-15 ENCOUNTER — Encounter: Payer: Self-pay | Admitting: *Deleted

## 2020-02-15 DIAGNOSIS — G4700 Insomnia, unspecified: Secondary | ICD-10-CM | POA: Diagnosis not present

## 2020-02-15 DIAGNOSIS — E785 Hyperlipidemia, unspecified: Secondary | ICD-10-CM | POA: Diagnosis not present

## 2020-02-15 DIAGNOSIS — M797 Fibromyalgia: Secondary | ICD-10-CM | POA: Diagnosis not present

## 2020-02-15 DIAGNOSIS — F329 Major depressive disorder, single episode, unspecified: Secondary | ICD-10-CM | POA: Diagnosis not present

## 2020-02-15 DIAGNOSIS — Z6827 Body mass index (BMI) 27.0-27.9, adult: Secondary | ICD-10-CM | POA: Diagnosis not present

## 2020-02-15 DIAGNOSIS — I10 Essential (primary) hypertension: Secondary | ICD-10-CM | POA: Diagnosis not present

## 2020-02-15 DIAGNOSIS — E039 Hypothyroidism, unspecified: Secondary | ICD-10-CM | POA: Diagnosis not present

## 2020-02-15 DIAGNOSIS — Z170 Estrogen receptor positive status [ER+]: Secondary | ICD-10-CM | POA: Diagnosis not present

## 2020-02-15 DIAGNOSIS — C50111 Malignant neoplasm of central portion of right female breast: Secondary | ICD-10-CM | POA: Diagnosis not present

## 2020-02-15 DIAGNOSIS — I480 Paroxysmal atrial fibrillation: Secondary | ICD-10-CM | POA: Diagnosis not present

## 2020-02-15 DIAGNOSIS — R7309 Other abnormal glucose: Secondary | ICD-10-CM | POA: Diagnosis not present

## 2020-02-15 MED ORDER — ANASTROZOLE 1 MG PO TABS: 1.0000 mg | ORAL_TABLET | Freq: Every day | ORAL | 3 refills | Status: DC

## 2020-02-15 NOTE — Assessment & Plan Note (Signed)
Palpable right nipple mass, breast MRI revealed a 1.6 cm mass, internal mammary lymphadenopathy was noted, biopsy could not be performed because of the location.  Prior history of bilateral nipple sparing mastectomies with reconstruction for high risk disease.  02/05/2020:Right central lumpectomy with implant: Grade 2 invasive lobular carcinoma, 2 cm, LCIS, lymphovascular invasion identified, perineural invasion identified, ER 100%, PR 100%, HER-2 negative, Ki-67 1%  Pathology and radiology counseling: Discussed with the patient, the details of pathology including the type of breast cancer,the clinical staging, the significance of ER, PR and HER-2/neu receptors and the implications for treatment. After reviewing the pathology in detail, we proceeded to discuss the different treatment options.  Treatment plan: 1.  Axillary evaluation will need to be discussed: Because she had previous bilateral mastectomies sentinel lymph node study would not be possible. 2.  Tumor board discussion regarding the role of radiation 3.  Adjuvant antiestrogen therapy with anastrozole 1 mg daily x7 years  Anastrozole counseling: We discussed the risks and benefits of anti-estrogen therapy with aromatase inhibitors. These include but not limited to insomnia, hot flashes, mood changes, vaginal dryness, bone density loss, and weight gain. We strongly believe that the benefits far outweigh the risks. Patient understands these risks and consented to starting treatment. Planned treatment duration is 7 years.  Return to clinic in 3 months for survivorship care plan visit

## 2020-02-15 NOTE — Progress Notes (Signed)
[facility] PET scan order form completed and faxed to nuclear medicine per MD recommendations.  Successfully faxed to [phone].

## 2020-02-16 ENCOUNTER — Telehealth: Payer: Self-pay | Admitting: Hematology and Oncology

## 2020-02-16 NOTE — Telephone Encounter (Signed)
Scheduled appt per 4/22 sch message - spoke with pt and she is aware of apt

## 2020-02-20 ENCOUNTER — Inpatient Hospital Stay (HOSPITAL_BASED_OUTPATIENT_CLINIC_OR_DEPARTMENT_OTHER): Payer: PPO | Admitting: Genetic Counselor

## 2020-02-20 ENCOUNTER — Other Ambulatory Visit: Payer: Self-pay | Admitting: Genetic Counselor

## 2020-02-20 ENCOUNTER — Inpatient Hospital Stay: Payer: PPO

## 2020-02-20 ENCOUNTER — Other Ambulatory Visit: Payer: Self-pay

## 2020-02-20 ENCOUNTER — Encounter: Payer: Self-pay | Admitting: Genetic Counselor

## 2020-02-20 DIAGNOSIS — C50111 Malignant neoplasm of central portion of right female breast: Secondary | ICD-10-CM

## 2020-02-20 DIAGNOSIS — C4372 Malignant melanoma of left lower limb, including hip: Secondary | ICD-10-CM

## 2020-02-20 DIAGNOSIS — Z8051 Family history of malignant neoplasm of kidney: Secondary | ICD-10-CM

## 2020-02-20 DIAGNOSIS — Z808 Family history of malignant neoplasm of other organs or systems: Secondary | ICD-10-CM | POA: Insufficient documentation

## 2020-02-20 DIAGNOSIS — Z803 Family history of malignant neoplasm of breast: Secondary | ICD-10-CM | POA: Diagnosis not present

## 2020-02-20 DIAGNOSIS — Z170 Estrogen receptor positive status [ER+]: Secondary | ICD-10-CM

## 2020-02-20 DIAGNOSIS — Z806 Family history of leukemia: Secondary | ICD-10-CM

## 2020-02-20 DIAGNOSIS — C73 Malignant neoplasm of thyroid gland: Secondary | ICD-10-CM

## 2020-02-20 LAB — GENETIC SCREENING ORDER

## 2020-02-20 NOTE — Progress Notes (Signed)
genet

## 2020-02-20 NOTE — Progress Notes (Signed)
REFERRING PROVIDER: Nicholas Lose, MD [address] [place],  [place] [address]  PRIMARY PROVIDER:  Nicoletta Dress, MD  PRIMARY REASON FOR VISIT:  1. Malignant neoplasm of central portion of right breast in female, estrogen receptor positive ([place])   2. CARCINOMA, THYROID GLAND, PAPILLARY   3. Malignant melanoma of skin of left lower extremity, including hip ([place])   4. Family history of breast cancer   5. Family history of nonmelanoma skin cancer   6. Family history of leukemia   7. Family history of kidney cancer     HISTORY OF PRESENT ILLNESS:   [name], a 74 y.o. female, was seen for a [place] cancer genetics consultation due to a personal and family history of cancer.  Ms. Layne presents to clinic today to discuss the possibility of a hereditary predisposition to cancer, genetic testing, and to further clarify her future cancer risks, as well as potential cancer risks for family members.   In 2021, at the age of 20, Patricia Alvarado was diagnosed with invasive lobular carcinoma (ER+/PR+/Her2-) of the right breast. The treatment plan includes antiestrogen therapy with anastrozole. Notably, Patricia Alvarado had bilateral mastectomies with reconstruction in 1992 due to a history of having had multiple lumps removed, one showing atypical lobular hyperplasia.  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 All of these cancers were diagnosed and treated in 2001, at the age of 77.   CANCER HISTORY:  Oncology History  Malignant neoplasm of central portion of right breast in female, estrogen receptor positive ([place])  12/11/2018 Initial Diagnosis   Palpable right nipple mass, breast MRI revealed a 1.6 cm mass, internal mammary lymphadenopathy was noted, biopsy could not be performed because of the location.  Prior history of bilateral mastectomies for high risk disease.   02/05/2020  Surgery   Right central lumpectomy with implant: Grade 2 invasive lobular carcinoma, 2 cm, LCIS, lymphovascular invasion identified, perineural invasion identified, ER 100%, PR 100%, HER-2 negative, Ki-67 1%     RISK FACTORS:  Menarche was at age 54.  First live birth at age 47.  OCP use for approximately 4 years.  Ovaries intact: no.  Hysterectomy: yes.  Menopausal status: postmenopausal.  HRT use: 15 years. Colonoscopy: yes; normal in 2010, per patient. Number of breast biopsies: 7, per patient. 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 Most of this exposure occurred without the use of protective equipment.    Past Medical History:  Diagnosis Date  . Basal cell carcinoma 2001   "forehead, between eyebrows"  . Carcinoma of thyroid gland ([place]) 2001  . Chronic lower back pain    "worse is across my hips" (05/26/2016)  . Depression   . Dyslipidemia   . Family history of breast cancer   . Family history of kidney cancer   . Family history of leukemia   . Family history of nonmelanoma skin cancer   . Fibrocystic breast   . Fibromyalgia   . Heart murmur   . History of blood transfusion 1992   "after subcutaneous mastectomies"  . Hypothyroidism   . Malignant melanoma of left ankle ([place]) 2001  . Migraine    "visual;  2-3 times/year" (05/26/2016)  . Mitral valve prolapse   . PONV (postoperative nausea and vomiting)   . Ventricular tachycardia (HCC)    Hx of, controlled on sotalol therapy    Past Surgical History:  Procedure Laterality Date  . ABDOMINAL HYSTERECTOMY  1998  . ANTERIOR CERVICAL DECOMP/DISCECTOMY FUSION  2001  . BACK SURGERY    . BASAL CELL CARCINOMA EXCISION  2001   "cut it out & did a flap, on forehead between my eyebrows"  .  BREAST IMPLANT EXCHANGE Bilateral 2001   [id number]  . BREAST IMPLANT REMOVAL Right 02/05/2020   Procedure: REMOVAL RIGHT BREAST IMPLANT AND CAPSULECTOMY;  Surgeon: Jovita Kussmaul, MD;  Location: [facility];  Service: General;  Laterality: Right;  . BREAST LUMPECTOMY Right 02/05/2020   Procedure: RIGHT BREAST CENTRAL LUMPECTOMY;  Surgeon: Jovita Kussmaul, MD;  Location: [facility];  Service: General;  Laterality: Right;  . CARPAL TUNNEL RELEASE Right 2006  . COLONOSCOPY    . DILATION AND CURETTAGE OF UTERUS  1970s X 2-3  . ELECTROPHYSIOLOGIC STUDY  1994 X 2;2001   "to see it it was sustained VT; cause thyroid levels were causing arrhythmias"  . LAPAROSCOPIC CHOLECYSTECTOMY    . MASTECTOMY Bilateral 1992   "subcutaneous"  . MELANOMA EXCISION Left 2001   "ankle, stage I"  . PLACEMENT OF BREAST IMPLANTS Bilateral 1992   [id number]  . TOTAL THYROIDECTOMY  12/1999   "cancer"  . VENTRICULAR ABLATION SURGERY  2011   ventricular tchycardia    Social History   Socioeconomic History  . Marital status: Married    Spouse name: Not on file  . Number of children: Not on file  . Years of education: Not on file  . Highest education level: Not on file  Occupational History  . Occupation: Financial controller GI    Employer: [employer]  Tobacco Use  . Smoking status: Never Smoker  . Smokeless tobacco: Never Used  Substance and Sexual Activity  . Alcohol use: No  . Drug use: No  . Sexual activity: Yes  Other Topics Concern  . Not on file  Social History Narrative   Married   Social Determinants of Health   Financial Resource Strain:   . Difficulty of Paying Living Expenses:   Food Insecurity:   . Worried About Charity fundraiser in the Last Year:   . Arboriculturist in the Last Year:   Transportation Needs:   . Film/video editor (Medical):   Marland Kitchen Lack of Transportation (Non-Medical):   Physical Activity:   . Days of Exercise per Week:   . Minutes of Exercise  per Session:   Stress:   . Feeling of Stress :   Social Connections:   . Frequency of Communication with Friends and Family:   . Frequency of Social Gatherings with Friends and Family:   . Attends Religious Services:   . Active Member of Clubs or Organizations:   . Attends Archivist Meetings:   Marland Kitchen Marital Status:      FAMILY HISTORY:  We obtained a detailed, 4-generation family history.  Significant diagnoses are listed below: Family History  Problem Relation Age of Onset  . Other Brother        AGENT ORANGE and AntiLupus  . Heart disease Brother        Stents and bypass x2  . Arrhythmia Brother        AFIB  . Heart attack Brother   .  Hypertension Brother   . Squamous cell carcinoma Brother 20       Skin  . Hyperlipidemia Mother 3  . Osteoporosis Mother   . Basal cell carcinoma Mother 65  . Heart disease Father 37  . Other Father        Cardiac arrest  . Breast cancer Maternal Grandmother 85       metastatic  . Breast cancer Other        dx. unknown age; maternal great-aunt  . Lung cancer Maternal Aunt 75       hx. of smoking  . Other Paternal Aunt        bilateral mastectomies due to multiple breast lumps  . Leukemia Maternal Aunt   . Cancer Maternal Aunt        unknown types  . Thyroid cancer Neg Hx    Ms. Tyndall has two sons - Legrand Como (age 18) and Duffy (age 6). Neither have had cancer, although both have had atypical moles removed. Additionally, Legrand Como has had one adenomatous colon polyp removed in his early 25s, and subsequent colonoscopies have been normal. Hedberg has had a normal colonoscopy. Ms. Radu has one brother who is 69 and has a history of squamous cell carcinoma diagnosed when he was 30, which had been attributed to [employer] exposure.   Ms. Nest mother died when she was 82 and had a history of basal cell carcinoma when she was 74. Ms. Tamayo had four maternal uncles and two maternal aunts. One aunt died at the age of  51 and had a history of leukemia and other, unknown cancers. The other aunt had lung cancer diagnosed at the age of 50 and had a history of smoking. One maternal cousin was diagnosed with kidney cancer at the age of 33. The histology of this cancer is unknown, although Ms. Drew says that there was only one tumor on the kidney that was removed. Ms. Gilkey maternal grandmother died from metastatic breast cancer at the age of 61, which had initially been diagnosed at age 79. Her grandmother had a sister who also had breast cancer. Ms. Guida maternal grandfather died when he was 102 and did not have cancer.  Ms. Labuda father died when he was 40 and did not have cancer. She had one paternal aunt and one paternal uncle. She notes that her aunt had bilateral mastectomies in her 58s due to multiple breast lumps. Her paternal grandmother died at the age of 16 and did not have cancer, and her paternal grandfather died at the age of 93. There are no known diagnoses of cancer on the paternal side of the family.  Ms. Carneiro is unaware of previous family history of genetic testing for hereditary cancer risks. Her maternal ancestors are of [place] and Native American descent, and paternal ancestors are of [place] descent. There is no reported Ashkenazi Jewish ancestry. There is no known consanguinity.  GENETIC COUNSELING ASSESSMENT: Ms. Arrasmith is a 74 y.o. female with a personal and family history of breast cancer, which is somewhat suggestive of a hereditary cancer and predisposition to cancer. We, therefore, discussed and recommended the following at today's visit.   DISCUSSION: We discussed that 5-10% of breast cancer is hereditary, with most cases associated with the BRCA1 and BRCA2 genes.  There are other genes that can be associated with hereditary breast cancer syndromes.  These include ATM, CHEK2, PALB2, etc.  We discussed that testing is beneficial for several reasons, including knowing about  other cancer  risks, identifying potential screening and risk-reduction options that may be appropriate, and to understand if other family members could be at risk for cancer and allow them to undergo genetic testing.   We reviewed the characteristics, features and inheritance patterns of hereditary cancer syndromes. We also discussed genetic testing, including the appropriate family members to test, the process of testing, insurance coverage and turn-around-time for results. We discussed the implications of a negative, positive and/or variant of uncertain significant result. We recommended Ms. Hellen pursue genetic testing for the Common Hereditary Cancers panel.   The Common Hereditary Cancers Panel offered by [facility] includes sequencing and/or deletion duplication testing of the following 48 genes: APC, ATM, AXIN2, BARD1, BMPR1A, BRCA1, BRCA2, BRIP1, CDH1, CDK4, CDKN2A (p14ARF), CDKN2A (p16INK4a), CHEK2, CTNNA1, DICER1, EPCAM (Deletion/duplication testing only), GREM1 (promoter region deletion/duplication testing only), KIT, MEN1, MLH1, MSH2, MSH3, MSH6, MUTYH, NBN, NF1, NHTL1, PALB2, PDGFRA, PMS2, POLD1, POLE, PTEN, RAD50, RAD51C, RAD51D, RNF43, SDHB, SDHC, SDHD, SMAD4, SMARCA4. STK11, TP53, TSC1, TSC2, and VHL.  The following genes are evaluated for sequence changes only: SDHA and HOXB13 c.251G>A variant only.   Based on Ms. Sheller's personal and family history of cancer, she meets medical criteria for genetic testing. Despite that she meets criteria, she may still have an out of pocket cost. We discussed that if her out of pocket cost for testing is over $100, the laboratory will call and confirm whether she wants to proceed with testing.  If the out of pocket cost of testing is less than $100 she will be billed by the genetic testing laboratory. We also discussed the option to request a benefits investigation from the genetic testing prior to ordering testing to have a better understanding of what  her out of pocket costs might be beforehand. Ms. Greenwell expressed interest in this option.   PLAN:  We will request a benefits investigation for analysis of the Common Hereditary Cancers panel through [facility]. Once her estimated out of pocket cost is available, we will reach out to Ms. Knipp via phone and she will determine if she would like to proceed with genetic testing at that time.  In the meantime, we recommend Ms. Boursiquot continue to follow the cancer screening guidelines given by her primary healthcare provider.  Lastly, we encouraged Ms. Greek to remain in contact with cancer genetics so that we can continuously update the family history and inform her of any changes in cancer genetics and testing that may be of benefit for this family.   Ms. Stouffer questions were answered to her satisfaction today. Our contact information was provided should additional questions or concerns arise. Thank you for the referral and allowing [place] to share in the care of your patient.   Clint Guy, [place], [employer] Licensed, Certified Dispensing [email or web address] Phone: [phone]  The patient was seen for a total of 40 minutes in face-to-face genetic counseling.  This patient was discussed with Drs. Magrinat, Lindi Adie and/or Burr Medico who agrees with the above.    _______________________________________________________________________ For Office Staff:  Number of people involved in session: 1 Was an Intern/ student involved with case: no

## 2020-02-21 ENCOUNTER — Telehealth: Payer: Self-pay | Admitting: *Deleted

## 2020-02-21 ENCOUNTER — Encounter: Payer: Self-pay | Admitting: *Deleted

## 2020-02-21 DIAGNOSIS — Z170 Estrogen receptor positive status [ER+]: Secondary | ICD-10-CM

## 2020-02-21 DIAGNOSIS — C50111 Malignant neoplasm of central portion of right female breast: Secondary | ICD-10-CM

## 2020-02-21 NOTE — Telephone Encounter (Signed)
Called pt with recommendations from TB from this morning. Recommendation: [place] PET scan, XRT, Anti-estrogen oral therapy. Received verbal understanding. Pt wishes to have xrt referral to Dr. Orlene Erm in [place].  Denies further questions at this time.

## 2020-02-22 ENCOUNTER — Telehealth: Payer: Self-pay | Admitting: Genetic Counselor

## 2020-02-22 NOTE — Telephone Encounter (Signed)
Discussed the estimated out of pocket cost for genetic testing with Patricia Alvarado. The genetic testing laboratory, [facility], estimates that her out of pocket cost will be $0. [name] is comfortable proceeding with genetic testing at this time. We will reach out to her again once we receive her results.

## 2020-02-23 ENCOUNTER — Encounter: Payer: Self-pay | Admitting: Hematology and Oncology

## 2020-02-23 ENCOUNTER — Telehealth: Payer: Self-pay

## 2020-02-23 NOTE — Telephone Encounter (Signed)
RN returned call, voicemail left for return call.  

## 2020-02-23 NOTE — Progress Notes (Signed)
Patient referred to [facility] for Radiation Treatment, referral faxed 4/29, confirmation received, faxed to [phone]

## 2020-02-26 NOTE — Progress Notes (Signed)
  HEMATOLOGY-ONCOLOGY TELEPHONE VISIT PROGRESS NOTE  I connected with [name] on 02/27/2020 at  9:15 AM EDT by telephone and verified that I am speaking with the correct person using two identifiers.  I discussed the limitations, risks, security and privacy concerns of performing an evaluation and management service by telephone and the availability of in person appointments.  I also discussed with the patient that there may be a patient responsible charge related to this service. The patient expressed understanding and agreed to proceed.   History of Present Illness: Patricia Alvarado is a 74 y.o. female with above-mentioned history of left breast cancer treated with right lumpectomy. She is over the phone today to discuss her treatment plan.   Oncology History  Malignant neoplasm of central portion of right breast in female, estrogen receptor positive ([place])  12/11/2018 Initial Diagnosis   Palpable right nipple mass, breast MRI revealed a 1.6 cm mass, internal mammary lymphadenopathy was noted, biopsy could not be performed because of the location.  Prior history of bilateral mastectomies for high risk disease.   02/05/2020 Surgery   Right central lumpectomy with implant: Grade 2 invasive lobular carcinoma, 2 cm, LCIS, lymphovascular invasion identified, perineural invasion identified, ER 100%, PR 100%, HER-2 negative, Ki-67 1%     Observations/Objective:     Assessment Plan:  Malignant neoplasm of central portion of right breast in female, estrogen receptor positive ([facility]) Palpable right nipple mass, breast MRI revealed a 1.6 cm mass, internal mammary lymphadenopathy was noted, biopsy could not be performed because of the location.  Prior history of bilateral nipple sparing mastectomies with reconstruction for high risk disease.  02/05/2020:Right central lumpectomy with implant: Grade 2 invasive lobular carcinoma, 2 cm, LCIS, lymphovascular invasion identified, perineural invasion  identified, ER 100%, PR 100%, HER-2 negative, Ki-67 1%  Treatment plan: 1.  PET CT scan scheduled for 03/01/2020 2.  Adjuvant radiation therapy (patient wants to receive radiation in [place] rather than [place]) 3.  Followed by adjuvant antiestrogen therapy with anastrozole 1 mg daily x7 years.  Left chest wall lesion: We will see if it needs to be biopsied. I informed her that we will call her back with that decision.  Return to clinic after the PET scan with a telephone visit.    I discussed the assessment and treatment plan with the patient. The patient was provided an opportunity to ask questions and all were answered. The patient agreed with the plan and demonstrated an understanding of the instructions. The patient was advised to call back or seek an in-person evaluation if the symptoms worsen or if the condition fails to improve as anticipated.   I provided 11 minutes of non-face-to-face time during this encounter.   Rulon Eisenmenger, MD 02/27/2020    I, Molly Dorshimer, am acting as scribe for Nicholas Lose, MD.  I have reviewed the above documentation for accuracy and completeness, and I agree with the above.

## 2020-02-27 ENCOUNTER — Inpatient Hospital Stay: Payer: PPO | Attending: Hematology and Oncology | Admitting: Hematology and Oncology

## 2020-02-27 ENCOUNTER — Other Ambulatory Visit: Payer: Self-pay | Admitting: *Deleted

## 2020-02-27 DIAGNOSIS — Z170 Estrogen receptor positive status [ER+]: Secondary | ICD-10-CM

## 2020-02-27 DIAGNOSIS — C50111 Malignant neoplasm of central portion of right female breast: Secondary | ICD-10-CM | POA: Diagnosis not present

## 2020-02-27 NOTE — Assessment & Plan Note (Signed)
Palpable right nipple mass, breast MRI revealed a 1.6 cm mass, internal mammary lymphadenopathy was noted, biopsy could not be performed because of the location.  Prior history of bilateral nipple sparing mastectomies with reconstruction for high risk disease.  02/05/2020:Right central lumpectomy with implant: Grade 2 invasive lobular carcinoma, 2 cm, LCIS, lymphovascular invasion identified, perineural invasion identified, ER 100%, PR 100%, HER-2 negative, Ki-67 1%  Treatment plan: 1.  PET CT scan scheduled for 03/01/2020 2.  Adjuvant radiation therapy 3.  Followed by adjuvant antiestrogen therapy with anastrozole 1 mg daily x7 years.  Return to clinic after the PET scan.

## 2020-02-28 ENCOUNTER — Telehealth: Payer: Self-pay | Admitting: *Deleted

## 2020-02-28 ENCOUNTER — Encounter: Payer: Self-pay | Admitting: *Deleted

## 2020-02-28 NOTE — Telephone Encounter (Signed)
Spoke to patient regarding the 21mm chest wall lesion.  She did have a 2nd look u/s on 3/8 which showed no suspicious finding to account for the mass seen in the location on MRI. Per report it did have an appearance of a benign prominent fat lobule. Patient verbalized understanding.

## 2020-02-29 ENCOUNTER — Telehealth: Payer: Self-pay | Admitting: Hematology and Oncology

## 2020-02-29 NOTE — Telephone Encounter (Signed)
Scheduled per 05/04 los, patient has been called and notified.

## 2020-03-01 ENCOUNTER — Ambulatory Visit (HOSPITAL_COMMUNITY)
Admission: RE | Admit: 2020-03-01 | Discharge: 2020-03-01 | Disposition: A | Discharge status: Home / Self Care | Payer: PPO | Source: Ambulatory Visit | Attending: Hematology and Oncology | Admitting: Hematology and Oncology

## 2020-03-01 ENCOUNTER — Other Ambulatory Visit: Payer: Self-pay

## 2020-03-01 DIAGNOSIS — Z170 Estrogen receptor positive status [ER+]: Secondary | ICD-10-CM | POA: Insufficient documentation

## 2020-03-01 DIAGNOSIS — C50111 Malignant neoplasm of central portion of right female breast: Secondary | ICD-10-CM | POA: Insufficient documentation

## 2020-03-01 DIAGNOSIS — C50911 Malignant neoplasm of unspecified site of right female breast: Secondary | ICD-10-CM | POA: Diagnosis not present

## 2020-03-01 MED ORDER — FLUOROESTRADIOL F 18 4-100 MCI/ML IV SOLN
5.6000 | Freq: Once | INTRAVENOUS | Status: AC
  Administered 2020-03-01: 14:00:00 5.6 via INTRAVENOUS

## 2020-03-01 NOTE — Progress Notes (Signed)
Location of Breast Cancer: Malignant neoplasm of central portion of RIGHT breast, estrogen receptor positive   Histology per Pathology Report:  02/05/2020 FINAL MICROSCOPIC DIAGNOSIS:  A. BREAST, RIGHT CENTRAL, LUMPECTOMY WITH IMPLANT:  - Invasive lobular carcinoma, grade II/III, spanning 2.0 cm.  - Lobular carcinoma in situ.  - Lymphovascular invasion is identified.  - Perineural invasion is identified.  - Invasive carcinoma is broadly less than 0.1 cm to the posterior  margin.  - See oncology table below.  Receptor Status: ER(100%), PR (100%), Her2-neu (negative), Ki-67(1%)  Did patient present with symptoms (if so, please note symptoms) or was this found on screening mammography?:  Palpable right nipple mass, breast MRI revealed a 1.6 cm mass, internal mammary lymphadenopathy was noted, biopsy could not be performed because of the location. Prior history of bilateral nipple sparingmastectomieswith reconstructionfor high risk disease.  Past/Anticipated interventions by surgeon, if any: 02/05/2020 Dr. Autumn Messing III RIGHT BREAST CENTRAL LUMPECTOMY (Right) REMOVAL RIGHT BREAST IMPLANT AND CAPSULECTOMY (Right)  Past/Anticipated interventions by medical oncology, if any:  Under care of Dr. Nicholas Lose 03/04/2020 Treatment plan: 1.  03/01/2020: Col[facility] Bullock PET: No activity within the internal mammary lymph nodes or chest wall. 2.  Adjuvant radiation therapy (patient wants to receive radiation in [place] rather than [place]) 3.  Followed by adjuvant antiestrogen therapy with anastrozole 1 mg daily x7 years 02/28/2020: Varney Biles [facility]ini--Breast Navigator "Spoke to patient regarding the 65m chest wall lesion.  She did have a 2nd look u/s on 3/8 which showed no suspicious finding to account for the mass seen in the location on MRI. Per report it did have an appearance of a benign prominent fat lobule. Patient verbalized understanding."  Lymphedema issues, if any:  None (saw Dr. TMarlou Starkslast  Thursday and he told her "everything looked great")    Pain issues, if any: She deals with chronic pain from fibromyalgia. Her right breast is still tender from the lumpectomy.   SAFETY ISSUES:  Prior radiation? No  Pacemaker/ICD? No  Possible current pregnancy? No--Abdominal hysterectomy 1998  Is the patient on methotrexate? No  Current Complaints / other details:  Nothing of note    KZola Button RN 03/01/2020,1:33 PM

## 2020-03-03 NOTE — Progress Notes (Signed)
  HEMATOLOGY-ONCOLOGY TELEPHONE VISIT PROGRESS NOTE  I connected with [name] on 03/04/2020 at  1:45 PM EDT by telephone and verified that I am speaking with the correct person using two identifiers.  I discussed the limitations, risks, security and privacy concerns of performing an evaluation and management service by telephone and the availability of in person appointments.  I also discussed with the patient that there may be a patient responsible charge related to this service. The patient expressed understanding and agreed to proceed.   History of Present Illness: Patricia Alvarado is a 74 y.o. female with above-mentioned history of right breast cancer treated with lumpectomy. PET scan on 03/01/20 showed no evidence of metastatic disease or remaining disease in the right chest wall. She is over the phone today for follow-up.   Oncology History  Malignant neoplasm of central portion of right breast in female, estrogen receptor positive ([place])  12/11/2018 Initial Diagnosis   Palpable right nipple mass, breast MRI revealed a 1.6 cm mass, internal mammary lymphadenopathy was noted, biopsy could not be performed because of the location.  Prior history of bilateral mastectomies for high risk disease.   02/05/2020 Surgery   Right central lumpectomy with implant: Grade 2 invasive lobular carcinoma, 2 cm, LCIS, lymphovascular invasion identified, perineural invasion identified, ER 100%, PR 100%, HER-2 negative, Ki-67 1%      Assessment Plan:  Malignant neoplasm of central portion of right breast in female, estrogen receptor positive ([facility]) Palpable right nipple mass, breast MRI revealed a 1.6 cm mass, internal mammary lymphadenopathy was noted, biopsy could not be performed because of the location. Prior history of bilateral nipple sparingmastectomieswith reconstructionfor high risk disease.  02/05/2020:Right central lumpectomy with implant: Grade 2 invasive lobular carcinoma, 2 cm, LCIS,  lymphovascular invasion identified, perineural invasion identified, ER 100%, PR 100%, HER-2 negative, Ki-67 1%  Treatment plan: 1. 5/7/2021[place] PET: No activity within the internal mammary lymph nodes or chest wall. 2.  Adjuvant radiation therapy (patient wants to receive radiation in [place] rather than [place]) 3.  Followed by adjuvant antiestrogen therapy with anastrozole 1 mg daily x7 years. ------------------------------------------------------------------------------------------------------------------------------------------- [facility] PET: No activity within the internal mammary lymph nodes or chest wall. Based on these excellent results, she will proceed to radiation.  I will see her after radiation to start antiestrogen therapy.    I discussed the assessment and treatment plan with the patient. The patient was provided an opportunity to ask questions and all were answered. The patient agreed with the plan and demonstrated an understanding of the instructions. The patient was advised to call back or seek an in-person evaluation if the symptoms worsen or if the condition fails to improve as anticipated.   I provided 11 minutes of non-face-to-face time during this encounter.   Rulon Eisenmenger, MD 03/04/2020    I, Molly Dorshimer, am acting as scribe for Nicholas Lose, MD.  I have reviewed the above documentation for accuracy and completeness, and I agree with the above.

## 2020-03-04 ENCOUNTER — Inpatient Hospital Stay (HOSPITAL_BASED_OUTPATIENT_CLINIC_OR_DEPARTMENT_OTHER): Payer: PPO | Admitting: Hematology and Oncology

## 2020-03-04 ENCOUNTER — Encounter: Payer: Self-pay | Admitting: *Deleted

## 2020-03-04 DIAGNOSIS — C50111 Malignant neoplasm of central portion of right female breast: Secondary | ICD-10-CM

## 2020-03-04 DIAGNOSIS — Z170 Estrogen receptor positive status [ER+]: Secondary | ICD-10-CM

## 2020-03-04 NOTE — Assessment & Plan Note (Addendum)
Palpable right nipple mass, breast MRI revealed a 1.6 cm mass, internal mammary lymphadenopathy was noted, biopsy could not be performed because of the location. Prior history of bilateral nipple sparingmastectomieswith reconstructionfor high risk disease.  02/05/2020:Right central lumpectomy with implant: Grade 2 invasive lobular carcinoma, 2 cm, LCIS, lymphovascular invasion identified, perineural invasion identified, ER 100%, PR 100%, HER-2 negative, Ki-67 1%  Treatment plan: 1. 5/7/2021[place] PET: No activity within the internal mammary lymph nodes or chest wall. 2.  Adjuvant radiation therapy (patient wants to receive radiation in [place] rather than [place]) 3.  Followed by adjuvant antiestrogen therapy with anastrozole 1 mg daily x7 years. ------------------------------------------------------------------------------------------------------------------------------------------- Patricia Alvarado PET: No activity within the internal mammary lymph nodes or chest wall. Based on these excellent results, she will proceed to radiation.  I will see her after radiation to start antiestrogen therapy.

## 2020-03-05 ENCOUNTER — Ambulatory Visit: Payer: Self-pay | Admitting: Genetic Counselor

## 2020-03-05 ENCOUNTER — Encounter: Payer: Self-pay | Admitting: Genetic Counselor

## 2020-03-05 ENCOUNTER — Ambulatory Visit
Admission: RE | Admit: 2020-03-05 | Discharge: 2020-03-05 | Disposition: A | Discharge status: Home / Self Care | Payer: PPO | Source: Ambulatory Visit | Attending: Radiation Oncology | Admitting: Radiation Oncology

## 2020-03-05 ENCOUNTER — Other Ambulatory Visit: Payer: Self-pay

## 2020-03-05 ENCOUNTER — Telehealth: Payer: Self-pay | Admitting: Genetic Counselor

## 2020-03-05 ENCOUNTER — Encounter: Payer: Self-pay | Admitting: Radiation Oncology

## 2020-03-05 VITALS — BP 147/67 | HR 72 | Temp 98.2°F | Resp 20 | Ht 64.5 in | Wt 161.6 lb

## 2020-03-05 DIAGNOSIS — C50111 Malignant neoplasm of central portion of right female breast: Secondary | ICD-10-CM | POA: Diagnosis not present

## 2020-03-05 DIAGNOSIS — Z510 Encounter for antineoplastic radiation therapy: Secondary | ICD-10-CM | POA: Insufficient documentation

## 2020-03-05 DIAGNOSIS — Z170 Estrogen receptor positive status [ER+]: Secondary | ICD-10-CM

## 2020-03-05 DIAGNOSIS — Z1379 Encounter for other screening for genetic and chromosomal anomalies: Secondary | ICD-10-CM | POA: Insufficient documentation

## 2020-03-05 DIAGNOSIS — Z01812 Encounter for preprocedural laboratory examination: Secondary | ICD-10-CM | POA: Insufficient documentation

## 2020-03-05 DIAGNOSIS — Z803 Family history of malignant neoplasm of breast: Secondary | ICD-10-CM | POA: Diagnosis not present

## 2020-03-05 NOTE — Progress Notes (Signed)
HPI:  [name] was previously seen in the [place] clinic due to a personal and family history of cancer and concerns regarding a hereditary predisposition to cancer. Please refer to our prior cancer genetics clinic note for more information regarding our discussion, assessment and recommendations, at the time. [name] recent genetic test results were disclosed to her, as were recommendations warranted by these results. These results and recommendations are discussed in more detail below.  CANCER HISTORY:  Oncology History  Malignant neoplasm of central portion of right breast in female, estrogen receptor positive ([place])  12/11/2018 Initial Diagnosis   Palpable right nipple mass, breast MRI revealed a 1.6 cm mass, internal mammary lymphadenopathy was noted, biopsy could not be performed because of the location.  Prior history of bilateral mastectomies for high risk disease.   02/05/2020 Surgery   Right central lumpectomy with implant: Grade 2 invasive lobular carcinoma, 2 cm, LCIS, lymphovascular invasion identified, perineural invasion identified, ER 100%, PR 100%, HER-2 negative, Ki-67 1%   03/01/2020 Genetic Testing   Negative genetic testing:  No pathogenic variants detected on the [facility] Common Hereditary Cancers Panel. The report date is 03/01/2020.  The Common Hereditary Cancers Panel offered by [facility] includes sequencing and/or deletion duplication testing of the following 48 genes: APC, ATM, AXIN2, BARD1, BMPR1A, BRCA1, BRCA2, BRIP1, CDH1, CDK4, CDKN2A (p14ARF), CDKN2A (p16INK4a), CHEK2, CTNNA1, DICER1, EPCAM (Deletion/duplication testing only), GREM1 (promoter region deletion/duplication testing only), KIT, MEN1, MLH1, MSH2, MSH3, MSH6, MUTYH, NBN, NF1, NHTL1, PALB2, PDGFRA, PMS2, POLD1, POLE, PTEN, RAD50, RAD51C, RAD51D, RNF43, SDHB, SDHC, SDHD, SMAD4, SMARCA4. STK11, TP53, TSC1, TSC2, and VHL.  The following genes were evaluated for sequence changes only: SDHA and  HOXB13 c.251G>A variant only.    03/05/2020 Cancer Staging   Staging form: Breast, AJCC 8th Edition - Pathologic: Stage Unknown (pT2, pNX, cM0, G2, ER+, PR+, HER2-) - Signed by Eppie Gibson, MD on 03/05/2020     FAMILY HISTORY:  We obtained a detailed, 4-generation family history.  Significant diagnoses are listed below: Family History  Problem Relation Age of Onset   Other Brother        AGENT ORANGE and AntiLupus   Heart disease Brother        Stents and bypass x2   Arrhythmia Brother        AFIB   Heart attack Brother    Hypertension Brother    Squamous cell carcinoma Brother 28       Skin   Hyperlipidemia Mother 34   Osteoporosis Mother    Basal cell carcinoma Mother 28   Heart disease Father 49   Other Father        Cardiac arrest   Breast cancer Maternal Grandmother 85       metastatic   Breast cancer Other        dx. unknown age; maternal great-aunt   Lung cancer Maternal Aunt 34       hx. of smoking   Other Paternal Aunt        bilateral mastectomies due to multiple breast lumps   Leukemia Maternal Aunt    Cancer Maternal Aunt        unknown types   Thyroid cancer Neg Hx    [name] has two sons - Patricia Alvarado (age 79) and Patricia Alvarado (age 59). Neither have had cancer, although both have had atypical moles removed. Additionally, Patricia Alvarado has had one adenomatous colon polyp removed in his early 15s, and subsequent colonoscopies have been normal. Patricia Alvarado  has had a normal colonoscopy. [name] has one brother who is 77 and has a history of squamous cell carcinoma diagnosed when he was 25, which had been attributed to [employer] exposure.   [name] mother died when she was 21 and had a history of basal cell carcinoma when she was 55. [name] had four maternal uncles and two maternal aunts. One aunt died at the age of 11 and had a history of leukemia and other, unknown cancers. The other aunt had lung cancer diagnosed at the age of 59 and  had a history of smoking. One maternal cousin was diagnosed with kidney cancer at the age of 54. The histology of this cancer is unknown, although [name] says that there was only one tumor on the kidney that was removed. [name] maternal grandmother died from metastatic breast cancer at the age of 64, which had initially been diagnosed at age 32. Her grandmother had a sister who also had breast cancer. [name] maternal grandfather died when he was 72 and did not have cancer.  [name] father died when he was 41 and did not have cancer. She had one paternal aunt and one paternal uncle. She notes that her aunt had bilateral mastectomies in her 5s due to multiple breast lumps. Her paternal grandmother died at the age of 52 and did not have cancer, and her paternal grandfather died at the age of 14. There are no known diagnoses of cancer on the paternal side of the family.  [name] is unaware of previous family history of genetic testing for hereditary cancer risks. Her maternal ancestors are of [place] and Native American descent, and paternal ancestors are of [place] descent. There is no reported Ashkenazi Jewish ancestry. There is no known consanguinity.  GENETIC TEST RESULTS: Genetic testing reported out on 03/01/2020 through the [facility] Common Hereditary Cancers panel. No pathogenic variants were detected.   The Common Hereditary Cancers Panel offered by [facility] includes sequencing and/or deletion duplication testing of the following 48 genes: APC, ATM, AXIN2, BARD1, BMPR1A, BRCA1, BRCA2, BRIP1, CDH1, CDK4, CDKN2A (p14ARF), CDKN2A (p16INK4a), CHEK2, CTNNA1, DICER1, EPCAM (Deletion/duplication testing only), GREM1 (promoter region deletion/duplication testing only), KIT, MEN1, MLH1, MSH2, MSH3, MSH6, MUTYH, NBN, NF1, NHTL1, PALB2, PDGFRA, PMS2, POLD1, POLE, PTEN, RAD50, RAD51C, RAD51D, RNF43, SDHB, SDHC, SDHD, SMAD4, SMARCA4. STK11, TP53, TSC1, TSC2, and VHL.  The following genes  were evaluated for sequence changes only: SDHA and HOXB13 c.251G>A variant only. The test report will be scanned into EPIC and located under the Molecular Pathology section of the Results Review tab.  A portion of the result report is included below for reference.     We discussed with Ms. Tygart that because current genetic testing is not perfect, it is possible there may be a gene mutation in one of these genes that current testing cannot detect, but that chance is small.  We also discussed, that there could be another gene that has not yet been discovered, or that we have not yet tested, that is responsible for the cancer diagnoses in the family. It is also possible there is a hereditary cause for the cancer in the family that Ms. Newlon did not inherit and therefore was not identified in her testing.  Therefore, it is important to remain in touch with cancer genetics in the future so that we can continue to offer Ms. Goedde the most up to date genetic testing.   CANCER SCREENING RECOMMENDATIONS: Ms. Cypert test result  is considered negative (normal).  This means that we have not identified a hereditary cause for her personal and family history of cancer at this time. While reassuring, this does not definitively rule out a hereditary predisposition to cancer. It is still possible that there could be genetic mutations that are undetectable by current technology. There could be genetic mutations in genes that have not been tested or identified to increase cancer risk.  Therefore, it is recommended she continue to follow the cancer management and screening guidelines provided by her oncology and primary healthcare providers.   An individual's cancer risk and medical management are not determined by genetic test results alone. Overall cancer risk assessment incorporates additional factors, including personal medical history, family history, and any available genetic information that may result in a  personalized plan for cancer prevention and surveillance.  RECOMMENDATIONS FOR FAMILY MEMBERS:  Individuals in this family might be at some increased risk of developing cancer, over the general population risk, simply due to the family history of cancer.  We recommended women in this family have a yearly mammogram beginning at age 73, or 63 years younger than the earliest onset of cancer, an annual clinical breast exam, and perform monthly breast self-exams. Women in this family should also have a gynecological exam as recommended by their primary provider. All family members should have a colonoscopy by age 8-50.  FOLLOW-UP: Lastly, we discussed with Ms. Larocque that cancer genetics is a rapidly advancing field and it is possible that new genetic tests will be appropriate for her and/or her family members in the future. We encouraged her to remain in contact with cancer genetics on an annual basis so we can update her personal and family histories and let her know of advances in cancer genetics that may benefit this family.   Our contact number was provided. Ms. Englander questions were answered to her satisfaction, and she knows she is welcome to call us at anytime with additional questions or concerns.   Clint Guy, MS, [facility] Genetic Counselor [email or web address] Phone: [phone]

## 2020-03-05 NOTE — Telephone Encounter (Signed)
Revealed negative genetic testing. Discussed that we do not know why she has breast cancer or why there is cancer in the family. It is possible that there could be a mutation in a different gene that we are not testing, or our current technology may not be able detect certain mutations. It will therefore be important for Patricia Alvarado to stay in contact with genetics to keep up with whether additional testing may be appropriate in the future.

## 2020-03-05 NOTE — Progress Notes (Signed)
Radiation Oncology         [phone] ________________________________  Initial outpatient Consultation  Name: Lorraine Cimmino MRN: [id number]  Date: 03/05/2020  DOB: Jun 26, 1946  IE:PPIRJJO, Lora Havens, MD  Nicholas Lose, MD   REFERRING PHYSICIAN: Nicholas Lose, MD  DIAGNOSIS:    ICD-10-CM   1. Malignant neoplasm of central portion of right breast in female, estrogen receptor positive ([place])  C50.111    Z17.0    Cancer Staging Malignant neoplasm of central portion of right breast in female, estrogen receptor positive ([place]) Staging form: Breast, AJCC 8th Edition - Pathologic: Stage Unknown (pT2, pNX, cM0, G2, ER+, PR+, HER2-) - Signed by Eppie Gibson, MD on 03/05/2020   CHIEF COMPLAINT: Here to discuss management of right breast cancer  HISTORY OF PRESENT ILLNESS::Leanne Leniya Breit is a 74 y.o. female who presented with right nipple changes. She has a previous  history of bilateral subcutaneous mastectomies with immediate reconstruction in 1992 for history of cysts and atypical hyperplasia.   She underwent diagnostic evaluation for the right nipple changes and was found to have a possible right nipple mass on ultrasound. At that time, it was felt to not be amenable to biopsy, and thus MRI was recommended.   Breast MRI was performed on 12/12/2019 and showed: 1.6 cm enhancing mass involving right nipple and extending to underlying implant, corresponding with sonographic findings and not amenable to biopsy given location and anatomy; 7 mm circumscribed, enhancing mass along medial left chest wall with questionable tract to overlying skin, possibly representing a sebaceous cyst; prominent internal mammary chain lymph nodes bilaterally. She reportedly underwent second-look ultrasound of the left chest wall mass on 01/01/2020, which showed the mass as having the appearance of a benign, prominent fat lobule.  She met with Dr. Marlou Starks and Dr. Iran Planas. She opted to proceed with excision of the right  breast mass and removal of the right implant. Performed on 02/05/2020, pathology from the procedure revealed: invasive lobular carcinoma, grade 2, spanning 2.0 cm; LCIS, lymphovascular invasion and perineural invasion identified; invasive carcinoma broadly less than 0.1 cm to posterior margin. ER 100%, PR 100%, Her2 negative by FISH.  She was referred to Dr. Lindi Adie on 02/15/2020, who recommended PET scan, possible radiation therapy, and anastrozole. [facility] PET scan was performed on 03/01/2020 and revealed: no evidence of estrogen-positive breast carcinoma in right chest wall or nodal metastasis within thorax; no evidence of distant metastatic disease.  She is a retired Marine scientist.  She also has a history of melanoma, papillary thyroid cancer, and basal cell carcinoma.  She reports that she overcame COVID-19 in October.  She is not yet comfortable receiving the vaccine.  She reports that her grandmother had breast cancer in her 28s and her great aunt, also on the maternal side, had breast cancer.  She is waiting for results from her genetic testing.  She has been instructed to take an antiestrogen pill after a month following radiotherapy.  PREVIOUS RADIATION THERAPY: No  PAST MEDICAL HISTORY:  has a past medical history of Basal cell carcinoma (2001), Carcinoma of thyroid gland ([place]) (2001), Chronic lower back pain, Depression, Dyslipidemia, Family history of breast cancer, Family history of kidney cancer, Family history of leukemia, Family history of nonmelanoma skin cancer, Fibrocystic breast, Fibromyalgia, Heart murmur, History of blood transfusion (1992), Hypothyroidism, Malignant melanoma of left ankle ([place]) (2001), Migraine, Mitral valve prolapse, PONV (postoperative nausea and vomiting), and Ventricular tachycardia ([place]).    PAST SURGICAL HISTORY: Past Surgical History:  Procedure Laterality  Date  . ABDOMINAL HYSTERECTOMY  1998  . ANTERIOR CERVICAL DECOMP/DISCECTOMY FUSION  2001  . BACK SURGERY    .  BASAL CELL CARCINOMA EXCISION  2001   "cut it out & did a flap, on forehead between my eyebrows"  . BREAST IMPLANT EXCHANGE Bilateral 2001   [id number]  . BREAST IMPLANT REMOVAL Right 02/05/2020   Procedure: REMOVAL RIGHT BREAST IMPLANT AND CAPSULECTOMY;  Surgeon: Jovita Kussmaul, MD;  Location: [facility];  Service: General;  Laterality: Right;  . BREAST LUMPECTOMY Right 02/05/2020   Procedure: RIGHT BREAST CENTRAL LUMPECTOMY;  Surgeon: Jovita Kussmaul, MD;  Location: [facility];  Service: General;  Laterality: Right;  . CARPAL TUNNEL RELEASE Right 2006  . COLONOSCOPY    . DILATION AND CURETTAGE OF UTERUS  1970s X 2-3  . ELECTROPHYSIOLOGIC STUDY  1994 X 2;2001   "to see it it was sustained VT; cause thyroid levels were causing arrhythmias"  . LAPAROSCOPIC CHOLECYSTECTOMY    . MASTECTOMY Bilateral 1992   "subcutaneous"  . MELANOMA EXCISION Left 2001   "ankle, stage I"  . PLACEMENT OF BREAST IMPLANTS Bilateral 1992   [id number]  . TOTAL THYROIDECTOMY  12/1999   "cancer"  . VENTRICULAR ABLATION SURGERY  2011   ventricular tchycardia    FAMILY HISTORY: family history includes Arrhythmia in her brother; Basal cell carcinoma (age of onset: 36) in her mother; Breast cancer in an other family member; Breast cancer (age of onset: 18) in her maternal grandmother; Cancer in her maternal aunt; Heart attack in her brother; Heart disease in her brother; Heart disease (age of onset: 85) in her father; Hyperlipidemia (age of onset: 77) in her mother; Hypertension in her brother; Leukemia in her maternal aunt; Lung cancer (age of onset: 3) in her maternal aunt; Osteoporosis in her mother; Other in her brother, father, and paternal aunt; Squamous cell carcinoma (age of onset: 14) in her brother.  SOCIAL HISTORY:  reports that she has never smoked. She has never used smokeless tobacco. She reports that she does not drink alcohol or use drugs.  ALLERGIES: Quinidine, Dofetilide,  Vicodin  [hydrocodone-acetaminophen], Amoxicillin, Atenolol, Atorvastatin, Latex, and Nadolol  MEDICATIONS:  Current Outpatient Medications  Medication Sig Dispense Refill  . acetaminophen (TYLENOL) 500 MG tablet Take 500-1,000 mg by mouth every 6 (six) hours as needed for mild pain or moderate pain.    Marland Kitchen ALPRAZolam (XANAX) 0.5 MG tablet Take 0.5 mg by mouth at bedtime.    Marland Kitchen anastrozole (ARIMIDEX) 1 MG tablet Take 1 tablet (1 mg total) by mouth daily. 90 tablet 3  . apixaban (ELIQUIS) 5 MG TABS tablet Take 1 tablet (5 mg total) by mouth 2 (two) times daily. 180 tablet 1  . Ascorbic Acid (VITAMIN C) 1000 MG tablet Take 1 tablet by mouth daily.    Marland Kitchen aspirin 81 MG EC tablet Take 81 mg by mouth daily.      . B Complex-C (SUPER B COMPLEX PO) Take 1 capsule by mouth at bedtime.    . Biotin 5 MG CAPS Take 5 mg by mouth at bedtime.     . bisacodyl (DULCOLAX) 5 MG EC tablet Take 5 mg by mouth daily.    . Calcium 600-200 MG-UNIT tablet Take 1 tablet by mouth daily.    . cetirizine (ZYRTEC) 10 MG tablet Take 10 mg by mouth daily as needed for allergies.     . Coenzyme Q10 (COQ10) 100 MG CAPS Take 100 mg by mouth daily.     Marland Kitchen  diltiazem (DILT-XR) 120 MG 24 hr capsule Take 1 capsule (120 mg total) by mouth daily. (Patient taking differently: Take 120 mg by mouth at bedtime. ) 90 capsule 3  . escitalopram (LEXAPRO) 20 MG tablet Take 20 mg by mouth daily.      . flecainide (TAMBOCOR) 100 MG tablet Take 1 tablet by mouth twice a day and may take an additional tablet daily for increased palpitations 225 tablet 2  . fluticasone (FLONASE) 50 MCG/ACT nasal spray as needed.    Marland Kitchen MANGANESE PO Take 40 mg by mouth at bedtime.    . Melatonin 10 MG TABS Take 10 mg by mouth at bedtime.     . Niacin CR 1000 MG TBCR Take 1 tablet by mouth.    . ranolazine (RANEXA) 500 MG 12 hr tablet Take 1 tablet (500 mg total) by mouth 2 (two) times daily. 180 tablet 3  . Rosuvastatin Calcium (CRESTOR PO) Take 10 mg by mouth once a  week.     . Thiamine Mononitrate (VITAMIN B1 PO) Take 1 tablet by mouth at bedtime. 100 mg    . thyroid (ARMOUR) 120 MG tablet Take 120 mg by mouth daily before breakfast.    . VITAMIN D, CHOLECALCIFEROL, PO Take 5,000 Units by mouth daily.    . Zinc 50 MG TABS Take 1 tablet by mouth daily.    Marland Kitchen OVER THE COUNTER MEDICATION Take 1,000 mg by mouth daily. Niacin time release OTC    . PRESCRIPTION MEDICATION (Nature Thyroid) Take 75m twice daily     No current facility-administered medications for this encounter.    REVIEW OF SYSTEMS: As above  PHYSICAL EXAM:  height is 5' 4.5" (1.638 m) and weight is 161 lb 9.6 oz (73.3 kg). Her temperature is 98.2 F (36.8 C). Her blood pressure is 147/67 (abnormal) and her pulse is 72. Her respiration is 20 and oxygen saturation is 100%.   General: Alert and oriented, in no acute distress Breasts: Status post bilateral mastectomies.  Implant has been removed from the right chest. Scar healing well on right chest.  No palpable masses or skin changes or adenopathy in the bilateral chest/axillae.  Nipple spared with intact implant on left side.   ECOG = 0  0 - Asymptomatic (Fully active, able to carry on all predisease activities without restriction)  1 - Symptomatic but completely ambulatory (Restricted in physically strenuous activity but ambulatory and able to carry out work of a light or sedentary nature. For example, light housework, office work)  2 - Symptomatic, <50% in bed during the day (Ambulatory and capable of all self care but unable to carry out any work activities. Up and about more than 50% of waking hours)  3 - Symptomatic, >50% in bed, but not bedbound (Capable of only limited self-care, confined to bed or chair 50% or more of waking hours)  4 - Bedbound (Completely disabled. Cannot carry on any self-care. Totally confined to bed or chair)  5 - Death   OEustace PenMM, Creech RH, Tormey DC, et al. [phone]. "Toxicity and response criteria of the  [facility]". AWendellOncol. 5 (6): 649-55   LABORATORY DATA:  Lab Results  Component Value Date   WBC 5.8 01/10/2020   HGB 13.8 01/10/2020   HCT 40.6 01/10/2020   MCV 93 01/10/2020   PLT 236 01/10/2020   CMP     Component Value Date/Time   NA 141 01/10/2020 1159   K 4.5 01/10/2020 1159  CL 103 01/10/2020 1159   CO2 25 01/10/2020 1159   GLUCOSE 106 (H) 01/10/2020 1159   GLUCOSE 98 05/29/2016 0232   BUN 11 01/10/2020 1159   CREATININE 0.79 01/10/2020 1159   CALCIUM 10.0 01/10/2020 1159   GFRNONAA 75 01/10/2020 1159   GFRAA 86 01/10/2020 1159         RADIOGRAPHY: NM PET ([facility]) WHOLE BODY  Result Date: 03/01/2020 CLINICAL DATA:  Initial treatment strategy for RIGHT breast carcinoma. Prior bilateral mastectomies. Concern for RIGHT internal mammary metastatic adenopathy. EXAM: NUCLEAR MEDICINE PET WHOLE BODY TECHNIQUE: 5.6 mCi F-18 Estradiol was injected intravenously. Full-ring PET imaging was performed from the vertex to feet after the radiotracer. CT data was obtained and used for attenuation correction and anatomic localization. COMPARISON:  Breast MRI 12/11/2018 FINDINGS: Mediastinal blood pool activity: 1.0 HEAD/NECK: No abnormal radiotracer accumulation within the brain. No radiotracer accumulation within cervical lymph nodes. Incidental CT findings: None CHEST: RIGHT mastectomy.  Sub glandular breast implant on the LEFT. No radiotracer accumulation in the RIGHT mastectomy bed. There is no radiotracer accumulation along the RIGHT internal mammary chain to suggest estrogen positive breast cancer metastasis. Similarly, no axillary metastatic disease or mediastinal nodal disease. Incidental CT findings: Review of the lung parenchyma demonstrates no suspicious nodularity. ABDOMEN/PELVIS: Intense physiologic activity noted in the liver. No evidence of metastatic adenopathy in the abdomen pelvis. No peritoneal metastasis. Post hysterectomy anatomy.  Incidental CT findings: No free fluid.  Postcholecystectomy. SKELETON: No radiotracer accumulation within the skeleton. Incidental CT findings: No lytic or sclerotic lesions identified on the whole-body CT scan. EXTREMITIES: No evidence of metastatic disease Incidental CT findings: None IMPRESSION: 1. No evidence of estrogen positive breast carcinoma in the RIGHT chest wall following mastectomy. 2. No evidence estrogen positive nodal metastasis within the thorax. Particular attention directed to the RIGHT internal mammary nodal chain. 3. No evidence of distant metastatic disease. Electronically Signed   By: Suzy Bouchard M.D.   On: 03/01/2020 16:24      IMPRESSION/PLAN: This is a very pleasant 74 year old woman with a history of right breast cancer  It was a pleasure meeting the patient today. We discussed the risks, benefits, and side effects of radiotherapy. I recommend radiotherapy to the right chest wall and regional nodes (PAB, SCV, IM) over a 6-week to reduce her risk of locoregional recurrence by 2/3.   As she has healed well, we obtained the next available simulation for her treatment planning which is this afternoon .  We spoke about acute effects including skin irritation/peeling and fatigue as well as much less common late effects including internal organ injury, rib fracture, pneumonitis. We spoke about the latest technology that is used to minimize the risk of late effects for patients undergoing radiotherapy to the breast or chest wall. No guarantees of treatment were given. The patient is enthusiastic about proceeding with treatment. I look forward to participating in the patient's care.   We discussed measures to reduce the risk of infection during the COVID-19 pandemic.  We discussed the vaccine at length.  I recommended that she get vaccinated but right now she is not comfortable with doing so.  She knows to contact me if she has further questions about the vaccine.  We will see  her back later this afternoon for treatment planning and anticipate starting her radiotherapy next week.  On date of service, in total, l spent 60 minutes on this encounter.  Patient was seen in person.    __________________________________________   Judson Roch  Isidore Moos, MD   This document serves as a record of services personally performed by Eppie Gibson, MD. It was created on her behalf by Wilburn Mylar, a trained medical scribe. The creation of this record is based on the scribe's personal observations and the provider's statements to them. This document has been checked and approved by the attending provider.

## 2020-03-06 ENCOUNTER — Encounter: Payer: Self-pay | Admitting: *Deleted

## 2020-03-07 ENCOUNTER — Telehealth: Payer: Self-pay | Admitting: Hematology and Oncology

## 2020-03-07 NOTE — Telephone Encounter (Signed)
Scheduled appt per 5/12 sch message - called pt and and sent reminder letter

## 2020-03-08 DIAGNOSIS — Z510 Encounter for antineoplastic radiation therapy: Secondary | ICD-10-CM | POA: Diagnosis not present

## 2020-03-08 DIAGNOSIS — C50111 Malignant neoplasm of central portion of right female breast: Secondary | ICD-10-CM | POA: Diagnosis not present

## 2020-03-12 ENCOUNTER — Ambulatory Visit
Admission: RE | Admit: 2020-03-12 | Discharge: 2020-03-12 | Disposition: A | Discharge status: Home / Self Care | Payer: PPO | Source: Ambulatory Visit | Attending: Radiation Oncology | Admitting: Radiation Oncology

## 2020-03-12 ENCOUNTER — Other Ambulatory Visit: Payer: Self-pay

## 2020-03-12 DIAGNOSIS — Z510 Encounter for antineoplastic radiation therapy: Secondary | ICD-10-CM | POA: Diagnosis not present

## 2020-03-12 DIAGNOSIS — C50111 Malignant neoplasm of central portion of right female breast: Secondary | ICD-10-CM | POA: Diagnosis not present

## 2020-03-13 ENCOUNTER — Other Ambulatory Visit: Payer: Self-pay

## 2020-03-13 ENCOUNTER — Ambulatory Visit
Admission: RE | Admit: 2020-03-13 | Discharge: 2020-03-13 | Disposition: A | Discharge status: Home / Self Care | Payer: PPO | Source: Ambulatory Visit | Attending: Radiation Oncology | Admitting: Radiation Oncology

## 2020-03-13 DIAGNOSIS — Z510 Encounter for antineoplastic radiation therapy: Secondary | ICD-10-CM | POA: Diagnosis not present

## 2020-03-13 DIAGNOSIS — C50111 Malignant neoplasm of central portion of right female breast: Secondary | ICD-10-CM | POA: Diagnosis not present

## 2020-03-14 ENCOUNTER — Ambulatory Visit
Admission: RE | Admit: 2020-03-14 | Discharge: 2020-03-14 | Disposition: A | Discharge status: Home / Self Care | Payer: PPO | Source: Ambulatory Visit | Attending: Radiation Oncology | Admitting: Radiation Oncology

## 2020-03-14 ENCOUNTER — Other Ambulatory Visit: Payer: Self-pay

## 2020-03-14 DIAGNOSIS — Z510 Encounter for antineoplastic radiation therapy: Secondary | ICD-10-CM | POA: Diagnosis not present

## 2020-03-14 DIAGNOSIS — C50111 Malignant neoplasm of central portion of right female breast: Secondary | ICD-10-CM | POA: Diagnosis not present

## 2020-03-15 ENCOUNTER — Ambulatory Visit
Admission: RE | Admit: 2020-03-15 | Discharge: 2020-03-15 | Disposition: A | Discharge status: Home / Self Care | Payer: PPO | Source: Ambulatory Visit | Attending: Radiation Oncology | Admitting: Radiation Oncology

## 2020-03-15 ENCOUNTER — Other Ambulatory Visit: Payer: Self-pay

## 2020-03-15 DIAGNOSIS — Z510 Encounter for antineoplastic radiation therapy: Secondary | ICD-10-CM | POA: Diagnosis not present

## 2020-03-15 DIAGNOSIS — C50111 Malignant neoplasm of central portion of right female breast: Secondary | ICD-10-CM | POA: Diagnosis not present

## 2020-03-18 ENCOUNTER — Ambulatory Visit
Admission: RE | Admit: 2020-03-18 | Discharge: 2020-03-18 | Disposition: A | Discharge status: Home / Self Care | Payer: PPO | Source: Ambulatory Visit | Attending: Radiation Oncology | Admitting: Radiation Oncology

## 2020-03-18 ENCOUNTER — Other Ambulatory Visit: Payer: Self-pay

## 2020-03-18 DIAGNOSIS — C50111 Malignant neoplasm of central portion of right female breast: Secondary | ICD-10-CM | POA: Diagnosis not present

## 2020-03-18 DIAGNOSIS — Z510 Encounter for antineoplastic radiation therapy: Secondary | ICD-10-CM | POA: Diagnosis not present

## 2020-03-18 DIAGNOSIS — Z170 Estrogen receptor positive status [ER+]: Secondary | ICD-10-CM

## 2020-03-18 MED ORDER — ALRA NON-METALLIC DEODORANT (RAD-ONC)
1.0000 "application " | Freq: Once | TOPICAL | Status: AC
  Administered 2020-03-18: 1 via TOPICAL

## 2020-03-19 ENCOUNTER — Ambulatory Visit
Admission: RE | Admit: 2020-03-19 | Discharge: 2020-03-19 | Disposition: A | Discharge status: Home / Self Care | Payer: PPO | Source: Ambulatory Visit | Attending: Radiation Oncology | Admitting: Radiation Oncology

## 2020-03-19 ENCOUNTER — Other Ambulatory Visit: Payer: Self-pay

## 2020-03-19 DIAGNOSIS — Z510 Encounter for antineoplastic radiation therapy: Secondary | ICD-10-CM | POA: Diagnosis not present

## 2020-03-19 DIAGNOSIS — C50111 Malignant neoplasm of central portion of right female breast: Secondary | ICD-10-CM | POA: Diagnosis not present

## 2020-03-20 ENCOUNTER — Other Ambulatory Visit: Payer: Self-pay

## 2020-03-20 ENCOUNTER — Ambulatory Visit
Admission: RE | Admit: 2020-03-20 | Discharge: 2020-03-20 | Disposition: A | Discharge status: Home / Self Care | Payer: PPO | Source: Ambulatory Visit | Attending: Radiation Oncology | Admitting: Radiation Oncology

## 2020-03-20 DIAGNOSIS — Z510 Encounter for antineoplastic radiation therapy: Secondary | ICD-10-CM | POA: Diagnosis not present

## 2020-03-20 DIAGNOSIS — C50111 Malignant neoplasm of central portion of right female breast: Secondary | ICD-10-CM | POA: Diagnosis not present

## 2020-03-21 ENCOUNTER — Ambulatory Visit
Admission: RE | Admit: 2020-03-21 | Discharge: 2020-03-21 | Disposition: A | Discharge status: Home / Self Care | Payer: PPO | Source: Ambulatory Visit | Attending: Radiation Oncology | Admitting: Radiation Oncology

## 2020-03-21 DIAGNOSIS — C50111 Malignant neoplasm of central portion of right female breast: Secondary | ICD-10-CM | POA: Diagnosis not present

## 2020-03-21 DIAGNOSIS — Z510 Encounter for antineoplastic radiation therapy: Secondary | ICD-10-CM | POA: Diagnosis not present

## 2020-03-22 ENCOUNTER — Ambulatory Visit
Admission: RE | Admit: 2020-03-22 | Discharge: 2020-03-22 | Disposition: A | Discharge status: Home / Self Care | Payer: PPO | Source: Ambulatory Visit | Attending: Radiation Oncology | Admitting: Radiation Oncology

## 2020-03-22 ENCOUNTER — Other Ambulatory Visit: Payer: Self-pay

## 2020-03-22 DIAGNOSIS — Z510 Encounter for antineoplastic radiation therapy: Secondary | ICD-10-CM | POA: Diagnosis not present

## 2020-03-22 DIAGNOSIS — C50111 Malignant neoplasm of central portion of right female breast: Secondary | ICD-10-CM | POA: Diagnosis not present

## 2020-03-26 ENCOUNTER — Ambulatory Visit
Admission: RE | Admit: 2020-03-26 | Discharge: 2020-03-26 | Disposition: A | Discharge status: Home / Self Care | Payer: PPO | Source: Ambulatory Visit | Attending: Radiation Oncology | Admitting: Radiation Oncology

## 2020-03-26 ENCOUNTER — Other Ambulatory Visit: Payer: Self-pay

## 2020-03-26 DIAGNOSIS — Z170 Estrogen receptor positive status [ER+]: Secondary | ICD-10-CM | POA: Insufficient documentation

## 2020-03-26 DIAGNOSIS — C50111 Malignant neoplasm of central portion of right female breast: Secondary | ICD-10-CM | POA: Insufficient documentation

## 2020-03-26 DIAGNOSIS — Z510 Encounter for antineoplastic radiation therapy: Secondary | ICD-10-CM | POA: Diagnosis not present

## 2020-03-27 ENCOUNTER — Other Ambulatory Visit: Payer: Self-pay

## 2020-03-27 ENCOUNTER — Ambulatory Visit
Admission: RE | Admit: 2020-03-27 | Discharge: 2020-03-27 | Disposition: A | Discharge status: Home / Self Care | Payer: PPO | Source: Ambulatory Visit | Attending: Radiation Oncology | Admitting: Radiation Oncology

## 2020-03-27 DIAGNOSIS — C50111 Malignant neoplasm of central portion of right female breast: Secondary | ICD-10-CM | POA: Diagnosis not present

## 2020-03-28 ENCOUNTER — Ambulatory Visit
Admission: RE | Admit: 2020-03-28 | Discharge: 2020-03-28 | Disposition: A | Discharge status: Home / Self Care | Payer: PPO | Source: Ambulatory Visit | Attending: Radiation Oncology | Admitting: Radiation Oncology

## 2020-03-28 DIAGNOSIS — C50111 Malignant neoplasm of central portion of right female breast: Secondary | ICD-10-CM | POA: Diagnosis not present

## 2020-03-29 ENCOUNTER — Ambulatory Visit
Admission: RE | Admit: 2020-03-29 | Discharge: 2020-03-29 | Disposition: A | Discharge status: Home / Self Care | Payer: PPO | Source: Ambulatory Visit | Attending: Radiation Oncology | Admitting: Radiation Oncology

## 2020-03-29 ENCOUNTER — Other Ambulatory Visit: Payer: Self-pay

## 2020-03-29 DIAGNOSIS — C50111 Malignant neoplasm of central portion of right female breast: Secondary | ICD-10-CM | POA: Diagnosis not present

## 2020-04-01 ENCOUNTER — Other Ambulatory Visit: Payer: Self-pay

## 2020-04-01 ENCOUNTER — Ambulatory Visit
Admission: RE | Admit: 2020-04-01 | Discharge: 2020-04-01 | Disposition: A | Discharge status: Home / Self Care | Payer: PPO | Source: Ambulatory Visit | Attending: Radiation Oncology | Admitting: Radiation Oncology

## 2020-04-01 DIAGNOSIS — Z170 Estrogen receptor positive status [ER+]: Secondary | ICD-10-CM

## 2020-04-01 DIAGNOSIS — C50111 Malignant neoplasm of central portion of right female breast: Secondary | ICD-10-CM | POA: Diagnosis not present

## 2020-04-01 MED ORDER — SONAFINE EX EMUL
1.0000 "application " | Freq: Two times a day (BID) | CUTANEOUS | Status: DC
  Administered 2020-04-01: 1 via TOPICAL

## 2020-04-02 ENCOUNTER — Ambulatory Visit
Admission: RE | Admit: 2020-04-02 | Discharge: 2020-04-02 | Disposition: A | Discharge status: Home / Self Care | Payer: PPO | Source: Ambulatory Visit | Attending: Radiation Oncology | Admitting: Radiation Oncology

## 2020-04-02 ENCOUNTER — Other Ambulatory Visit: Payer: Self-pay

## 2020-04-02 DIAGNOSIS — C50111 Malignant neoplasm of central portion of right female breast: Secondary | ICD-10-CM | POA: Diagnosis not present

## 2020-04-03 ENCOUNTER — Ambulatory Visit
Admission: RE | Admit: 2020-04-03 | Discharge: 2020-04-03 | Disposition: A | Discharge status: Home / Self Care | Payer: PPO | Source: Ambulatory Visit | Attending: Radiation Oncology | Admitting: Radiation Oncology

## 2020-04-03 ENCOUNTER — Other Ambulatory Visit: Payer: Self-pay

## 2020-04-03 DIAGNOSIS — C50111 Malignant neoplasm of central portion of right female breast: Secondary | ICD-10-CM | POA: Diagnosis not present

## 2020-04-04 ENCOUNTER — Other Ambulatory Visit: Payer: Self-pay

## 2020-04-04 ENCOUNTER — Ambulatory Visit
Admission: RE | Admit: 2020-04-04 | Discharge: 2020-04-04 | Disposition: A | Discharge status: Home / Self Care | Payer: PPO | Source: Ambulatory Visit | Attending: Radiation Oncology | Admitting: Radiation Oncology

## 2020-04-04 DIAGNOSIS — C50111 Malignant neoplasm of central portion of right female breast: Secondary | ICD-10-CM | POA: Diagnosis not present

## 2020-04-05 ENCOUNTER — Ambulatory Visit
Admission: RE | Admit: 2020-04-05 | Discharge: 2020-04-05 | Disposition: A | Discharge status: Home / Self Care | Payer: PPO | Source: Ambulatory Visit | Attending: Radiation Oncology | Admitting: Radiation Oncology

## 2020-04-05 ENCOUNTER — Other Ambulatory Visit: Payer: Self-pay

## 2020-04-05 DIAGNOSIS — C50111 Malignant neoplasm of central portion of right female breast: Secondary | ICD-10-CM | POA: Diagnosis not present

## 2020-04-08 ENCOUNTER — Ambulatory Visit
Admission: RE | Admit: 2020-04-08 | Discharge: 2020-04-08 | Disposition: A | Discharge status: Home / Self Care | Payer: PPO | Source: Ambulatory Visit | Attending: Radiation Oncology | Admitting: Radiation Oncology

## 2020-04-08 ENCOUNTER — Other Ambulatory Visit: Payer: Self-pay

## 2020-04-08 DIAGNOSIS — E785 Hyperlipidemia, unspecified: Secondary | ICD-10-CM | POA: Diagnosis not present

## 2020-04-08 DIAGNOSIS — C50111 Malignant neoplasm of central portion of right female breast: Secondary | ICD-10-CM | POA: Diagnosis not present

## 2020-04-08 DIAGNOSIS — Z9181 History of falling: Secondary | ICD-10-CM | POA: Diagnosis not present

## 2020-04-08 DIAGNOSIS — Z1331 Encounter for screening for depression: Secondary | ICD-10-CM | POA: Diagnosis not present

## 2020-04-08 DIAGNOSIS — Z170 Estrogen receptor positive status [ER+]: Secondary | ICD-10-CM

## 2020-04-08 DIAGNOSIS — Z0000 Encounter for general adult medical examination without abnormal findings: Secondary | ICD-10-CM | POA: Diagnosis not present

## 2020-04-08 MED ORDER — SONAFINE EX EMUL
1.0000 "application " | Freq: Two times a day (BID) | CUTANEOUS | Status: DC
  Administered 2020-04-08: 1 via TOPICAL

## 2020-04-09 ENCOUNTER — Other Ambulatory Visit: Payer: Self-pay

## 2020-04-09 ENCOUNTER — Ambulatory Visit
Admission: RE | Admit: 2020-04-09 | Discharge: 2020-04-09 | Disposition: A | Discharge status: Home / Self Care | Payer: PPO | Source: Ambulatory Visit | Attending: Radiation Oncology | Admitting: Radiation Oncology

## 2020-04-09 DIAGNOSIS — C50111 Malignant neoplasm of central portion of right female breast: Secondary | ICD-10-CM | POA: Diagnosis not present

## 2020-04-10 ENCOUNTER — Other Ambulatory Visit: Payer: Self-pay

## 2020-04-10 ENCOUNTER — Ambulatory Visit
Admission: RE | Admit: 2020-04-10 | Discharge: 2020-04-10 | Disposition: A | Discharge status: Home / Self Care | Payer: PPO | Source: Ambulatory Visit | Attending: Radiation Oncology | Admitting: Radiation Oncology

## 2020-04-10 DIAGNOSIS — C50111 Malignant neoplasm of central portion of right female breast: Secondary | ICD-10-CM | POA: Diagnosis not present

## 2020-04-11 ENCOUNTER — Other Ambulatory Visit: Payer: Self-pay

## 2020-04-11 ENCOUNTER — Ambulatory Visit
Admission: RE | Admit: 2020-04-11 | Discharge: 2020-04-11 | Disposition: A | Discharge status: Home / Self Care | Payer: PPO | Source: Ambulatory Visit | Attending: Radiation Oncology | Admitting: Radiation Oncology

## 2020-04-11 DIAGNOSIS — C50111 Malignant neoplasm of central portion of right female breast: Secondary | ICD-10-CM | POA: Diagnosis not present

## 2020-04-12 ENCOUNTER — Ambulatory Visit
Admission: RE | Admit: 2020-04-12 | Discharge: 2020-04-12 | Disposition: A | Discharge status: Home / Self Care | Payer: PPO | Source: Ambulatory Visit | Attending: Radiation Oncology | Admitting: Radiation Oncology

## 2020-04-12 ENCOUNTER — Other Ambulatory Visit: Payer: Self-pay

## 2020-04-12 DIAGNOSIS — C50111 Malignant neoplasm of central portion of right female breast: Secondary | ICD-10-CM | POA: Diagnosis not present

## 2020-04-14 NOTE — Progress Notes (Signed)
Patient Care Team: Nicoletta Dress, MD as PCP - General (Internal Medicine) Mauro Kaufmann, RN as Oncology Nurse Navigator Rockwell Germany, RN as Oncology Nurse Navigator Nicholas Lose, MD as Medical Oncologist (Hematology and Oncology)  DIAGNOSIS:    ICD-10-CM   1. Malignant neoplasm of central portion of right breast in female, estrogen receptor positive ([place])  C50.111    Z17.0     SUMMARY OF ONCOLOGIC HISTORY: Oncology History  Malignant neoplasm of central portion of right breast in female, estrogen receptor positive ([place])  12/11/2018 Initial Diagnosis   Palpable right nipple mass, breast MRI revealed a 1.6 cm mass, internal mammary lymphadenopathy was noted, biopsy could not be performed because of the location.  Prior history of bilateral mastectomies for high risk disease.   02/05/2020 Surgery   Right central lumpectomy with implant: Grade 2 invasive lobular carcinoma, 2 cm, LCIS, lymphovascular invasion identified, perineural invasion identified, ER 100%, PR 100%, HER-2 negative, Ki-67 1%   03/01/2020 Genetic Testing   Negative genetic testing:  No pathogenic variants detected on the [facility] Common Hereditary Cancers Panel. The report date is 03/01/2020.  The Common Hereditary Cancers Panel offered by [facility] includes sequencing and/or deletion duplication testing of the following 48 genes: APC, ATM, AXIN2, BARD1, BMPR1A, BRCA1, BRCA2, BRIP1, CDH1, CDK4, CDKN2A (p14ARF), CDKN2A (p16INK4a), CHEK2, CTNNA1, DICER1, EPCAM (Deletion/duplication testing only), GREM1 (promoter region deletion/duplication testing only), KIT, MEN1, MLH1, MSH2, MSH3, MSH6, MUTYH, NBN, NF1, NHTL1, PALB2, PDGFRA, PMS2, POLD1, POLE, PTEN, RAD50, RAD51C, RAD51D, RNF43, SDHB, SDHC, SDHD, SMAD4, SMARCA4. STK11, TP53, TSC1, TSC2, and VHL.  The following genes were evaluated for sequence changes only: SDHA and HOXB13 c.251G>A variant only.    03/05/2020 Cancer Staging   Staging form: Breast, AJCC 8th Edition -  Pathologic: Stage Unknown (pT2, pNX, cM0, G2, ER+, PR+, HER2-) - Signed by Eppie Gibson, MD on 03/05/2020   03/13/2020 -  Radiation Therapy   Adjuvant radiation     CHIEF COMPLIANT: Follow-up to discuss antiestrogen therapy  INTERVAL HISTORY: Patricia Alvarado is a 74 y.o. with above-mentioned history of right breast cancer treated with lumpectomy and who is currently on radiation treatment. She presents to the clinic today to discuss anti-estrogen therapy.   ALLERGIES:  is allergic to quinidine, dofetilide, vicodin  [hydrocodone-acetaminophen], amoxicillin, atenolol, atorvastatin, latex, and nadolol.  MEDICATIONS:  Current Outpatient Medications  Medication Sig Dispense Refill  . acetaminophen (TYLENOL) 500 MG tablet Take 500-1,000 mg by mouth every 6 (six) hours as needed for mild pain or moderate pain.    Marland Kitchen ALPRAZolam (XANAX) 0.5 MG tablet Take 0.5 mg by mouth at bedtime.    Marland Kitchen anastrozole (ARIMIDEX) 1 MG tablet Take 1 tablet (1 mg total) by mouth daily. 90 tablet 3  . apixaban (ELIQUIS) 5 MG TABS tablet Take 1 tablet (5 mg total) by mouth 2 (two) times daily. 180 tablet 1  . Ascorbic Acid (VITAMIN C) 1000 MG tablet Take 1 tablet by mouth daily.    Marland Kitchen aspirin 81 MG EC tablet Take 81 mg by mouth daily.      . B Complex-C (SUPER B COMPLEX PO) Take 1 capsule by mouth at bedtime.    . Biotin 5 MG CAPS Take 5 mg by mouth at bedtime.     . bisacodyl (DULCOLAX) 5 MG EC tablet Take 5 mg by mouth daily.    . Calcium 600-200 MG-UNIT tablet Take 1 tablet by mouth daily.    . cetirizine (ZYRTEC) 10 MG tablet Take 10  mg by mouth daily as needed for allergies.     . Coenzyme Q10 (COQ10) 100 MG CAPS Take 100 mg by mouth daily.     Marland Kitchen diltiazem (DILT-XR) 120 MG 24 hr capsule Take 1 capsule (120 mg total) by mouth daily. (Patient taking differently: Take 120 mg by mouth at bedtime. ) 90 capsule 3  . escitalopram (LEXAPRO) 20 MG tablet Take 20 mg by mouth daily.      . flecainide (TAMBOCOR) 100 MG tablet  Take 1 tablet by mouth twice a day and may take an additional tablet daily for increased palpitations 225 tablet 2  . fluticasone (FLONASE) 50 MCG/ACT nasal spray as needed.    Marland Kitchen MANGANESE PO Take 40 mg by mouth at bedtime.    . Melatonin 10 MG TABS Take 10 mg by mouth at bedtime.     . Niacin CR 1000 MG TBCR Take 1 tablet by mouth.    Marland Kitchen OVER THE COUNTER MEDICATION Take 1,000 mg by mouth daily. Niacin time release OTC    . PRESCRIPTION MEDICATION (Nature Thyroid) Take 82m twice daily    . ranolazine (RANEXA) 500 MG 12 hr tablet Take 1 tablet (500 mg total) by mouth 2 (two) times daily. 180 tablet 3  . Rosuvastatin Calcium (CRESTOR PO) Take 10 mg by mouth once a week.     . Thiamine Mononitrate (VITAMIN B1 PO) Take 1 tablet by mouth at bedtime. 100 mg    . thyroid (ARMOUR) 120 MG tablet Take 120 mg by mouth daily before breakfast.    . VITAMIN D, CHOLECALCIFEROL, PO Take 5,000 Units by mouth daily.    . Zinc 50 MG TABS Take 1 tablet by mouth daily.     No current facility-administered medications for this visit.    PHYSICAL EXAMINATION: ECOG PERFORMANCE STATUS: 1 - Symptomatic but completely ambulatory  There were no vitals filed for this visit. There were no vitals filed for this visit.  LABORATORY DATA:  I have reviewed the data as listed CMP Latest Ref Rng & Units 01/10/2020 05/29/2016 05/28/2016  Glucose 65 - 99 mg/dL 106(H) 98 110(H)  BUN 8 - 27 mg/dL '11 19 15  ' Creatinine 0.57 - 1.00 mg/dL 0.79 0.75 0.64  Sodium 134 - 144 mmol/L 141 140 138  Potassium 3.5 - 5.2 mmol/L 4.5 4.7 3.9  Chloride 96 - 106 mmol/L 103 105 105  CO2 20 - 29 mmol/L '25 28 27  ' Calcium 8.7 - 10.3 mg/dL 10.0 9.9 9.8    Lab Results  Component Value Date   WBC 5.8 01/10/2020   HGB 13.8 01/10/2020   HCT 40.6 01/10/2020   MCV 93 01/10/2020   PLT 236 01/10/2020   NEUTROABS 2.7 05/26/2016    ASSESSMENT & PLAN:  Malignant neoplasm of central portion of right breast in female, estrogen receptor positive  ([place] 02/05/2020: Right central lumpectomy with implant.  Grade 2 invasive lobular cancer 2 cm with LCIS and lymphovascular invasion and perineural invasion, ER 100%, PR 100%, HER-2 negative, Ki-67 1% [facility] PET scan: No activity within the internal mammary lymph nodes or chest wall Adjuvant radiation therapy 03/13/2020-04/23/2020  Treatment plan: After radiation is complete patient received antiestrogen therapy with anastrozole 1 mg daily x7 years Patient was counseled about participating in antiestrogen therapy compliance study.  She was also offered participation in upbeat clinical trial.  Osteopenia: T score -2.1: I recommended starting bisphosphonate therapy.  We discussed different options and she picked Boniva.  I sent a prescription for Boniva.  She will find out if her insurance covers it.  If it does not then we will send prescription for Fosamax.  Return to clinic in 3 months for survivorship care plan visit   No orders of the defined types were placed in this encounter.  The patient has a good understanding of the overall plan. she agrees with it. she will call with any problems that may develop before the next visit here.  Total time spent: 30 mins including face to face time and time spent for planning, charting and coordination of care  Nicholas Lose, MD 04/15/2020  I, Cloyde Reams Dorshimer, am acting as scribe for Dr. Nicholas Lose.  I have reviewed the above documentation for accuracy and completeness, and I agree with the above.

## 2020-04-15 ENCOUNTER — Other Ambulatory Visit: Payer: Self-pay

## 2020-04-15 ENCOUNTER — Inpatient Hospital Stay: Payer: PPO | Attending: Hematology and Oncology | Admitting: Hematology and Oncology

## 2020-04-15 ENCOUNTER — Ambulatory Visit
Admission: RE | Admit: 2020-04-15 | Discharge: 2020-04-15 | Disposition: A | Discharge status: Home / Self Care | Payer: PPO | Source: Ambulatory Visit | Attending: Radiation Oncology | Admitting: Radiation Oncology

## 2020-04-15 DIAGNOSIS — Z170 Estrogen receptor positive status [ER+]: Secondary | ICD-10-CM | POA: Diagnosis not present

## 2020-04-15 DIAGNOSIS — C50111 Malignant neoplasm of central portion of right female breast: Secondary | ICD-10-CM

## 2020-04-15 DIAGNOSIS — M8580 Other specified disorders of bone density and structure, unspecified site: Secondary | ICD-10-CM | POA: Insufficient documentation

## 2020-04-15 MED ORDER — IBANDRONATE SODIUM 150 MG PO TABS: 150.0000 mg | ORAL_TABLET | ORAL | 3 refills | Status: DC

## 2020-04-15 NOTE — Assessment & Plan Note (Signed)
02/05/2020: Right central lumpectomy with implant.  Grade 2 invasive lobular cancer 2 cm with LCIS and lymphovascular invasion and perineural invasion, ER 100%, PR 100%, HER-2 negative, Ki-67 1% [facility] PET scan: No activity within the internal mammary lymph nodes or chest wall Adjuvant radiation therapy 03/13/2020-04/23/2020  Treatment plan: After radiation is complete patient received antiestrogen therapy with anastrozole 1 mg daily x7 years Patient was counseled about participating in antiestrogen therapy compliance study.  She was also offered participation in upbeat clinical trial.  Return to clinic in 3 months for survivorship care plan visit

## 2020-04-15 NOTE — Research (Signed)
UPBEAT [id number] - UNDERSTANDING and PREDICTING BREAST CANCER EVENTS AFTER TREATMENT  DCP-001 USE OF A CLINICAL TRIAL SCREENING TOOL TO ADDRESS CANCER HEALTH DISPARITIES IN THE [place] ([facility])   04/15/2020 14:40PM  Referral received from Dr. Lindi Adie to discuss potential voluntary participation in the [id number] UPBEAT study. Met with patient (unaccompanied today) in exam room 28 along with clinical research coordinator Carol Ada for approximately 30 minutes following her provider visit. The [place] Clinical Trial brochure was provided along with my business card with direct contact information. We then briefly reviewed the UPBEAT consent form (version 11/29/19, CH active date 01/04/20) including the voluntary nature of participation, the study purpose, study groups, length of participation, cardiac MRI procedure, patient questionnaires, physical and neurocognitive tests, lab specimens, potential risks and benefits, costs and compensation, privacy measures, and optional sample collection. An opportunity for questions was provided but Patricia Alvarado denies having any questions at this time. A brief overview of HIPAA consent (dated 03/11/16) was also provided.  After review of the UPBEAT study materials, voluntary participation in the DCP-001 study was discussed. We reviewed the DCP-001 consent form (protocol version date 04/18/19,  Active Date 10/31/19) including the voluntary nature of participation, the project purpose, risks and benefits, and who will see the provided information. Patient verbalizes understanding that this study is a one-time consent for collection of demographic variables and that no patient identifiers are being reported. A copy of the NIH DCP-001 HIPPA form (dated 12/17/14) was also discussed and provided.   Copies of both consents with corresponding HIPAA forms were provided along with the [facility] health brochure and my contact information. [name] verbalizes  understanding that there are eligibility criteria that must be reviewed prior to participation. She also understands that all baseline study activities would be completed prior to starting Arimidex. I asked if I may contact her in a few days after she has had adequate time to review the study materials and she states that will be fine; she requests I call her cell phone any day after 9am. [name] was thanked for her time and willingness to review the provided materials for potential participation in either study. She is encouraged to contact me should any questions or concerns arise and she verbalizes understanding. Current plan is to follow-up via phone call later this week.  Dionne Bucy. Sharlett Iles, BSN, RN, CIC 04/15/2020 4:12 PM

## 2020-04-16 ENCOUNTER — Ambulatory Visit
Admission: RE | Admit: 2020-04-16 | Discharge: 2020-04-16 | Disposition: A | Discharge status: Home / Self Care | Payer: PPO | Source: Ambulatory Visit | Attending: Radiation Oncology | Admitting: Radiation Oncology

## 2020-04-16 ENCOUNTER — Encounter (INDEPENDENT_AMBULATORY_CARE_PROVIDER_SITE_OTHER): Payer: Self-pay

## 2020-04-16 ENCOUNTER — Other Ambulatory Visit: Payer: Self-pay

## 2020-04-16 DIAGNOSIS — C50111 Malignant neoplasm of central portion of right female breast: Secondary | ICD-10-CM | POA: Diagnosis not present

## 2020-04-17 ENCOUNTER — Telehealth: Payer: Self-pay | Admitting: Hematology and Oncology

## 2020-04-17 ENCOUNTER — Other Ambulatory Visit: Payer: Self-pay

## 2020-04-17 ENCOUNTER — Ambulatory Visit
Admission: RE | Admit: 2020-04-17 | Discharge: 2020-04-17 | Disposition: A | Discharge status: Home / Self Care | Payer: PPO | Source: Ambulatory Visit | Attending: Radiation Oncology | Admitting: Radiation Oncology

## 2020-04-17 DIAGNOSIS — C50111 Malignant neoplasm of central portion of right female breast: Secondary | ICD-10-CM | POA: Diagnosis not present

## 2020-04-17 NOTE — Telephone Encounter (Signed)
Scheduled per 6/21 los. Called and spoke with patient, confirmed 9/22 appt

## 2020-04-18 ENCOUNTER — Ambulatory Visit
Admission: RE | Admit: 2020-04-18 | Discharge: 2020-04-18 | Disposition: A | Discharge status: Home / Self Care | Payer: PPO | Source: Ambulatory Visit | Attending: Radiation Oncology | Admitting: Radiation Oncology

## 2020-04-18 ENCOUNTER — Telehealth: Payer: Self-pay

## 2020-04-18 ENCOUNTER — Other Ambulatory Visit: Payer: Self-pay

## 2020-04-18 DIAGNOSIS — Z170 Estrogen receptor positive status [ER+]: Secondary | ICD-10-CM

## 2020-04-18 DIAGNOSIS — C50111 Malignant neoplasm of central portion of right female breast: Secondary | ICD-10-CM | POA: Diagnosis not present

## 2020-04-18 NOTE — Telephone Encounter (Signed)
UPBEAT [id number] - UNDERSTANDING and PREDICTING BREAST CANCER EVENTS AFTER TREATMENT  DCP-001 USE OF A CLINICAL TRIAL SCREENING TOOL TO ADDRESS CANCER HEALTH DISPARITIES IN THE [place] ([facility])  04/18/2020 10:10AM FOLLOW-UP TELEPHONE CALL: Outgoing call to follow-up on previous meeting regarding potential voluntary participation in the [id number] UPBEAT study and/or DCP-001 data collection study. Verified that I was speaking with Patricia Alvarado: we spoke for approximately five minutes. When asked if she had an opportunity to review the materials provided during our last meeting she states, "Yes and after doing some research, I have decided not to take it [anastrozole] at all. I've just had enough. I don't want to upset anybody but I'm done." Patricia Alvarado does not wish to proceed with any study participation at this time. I verbalized understanding of her decision and asked if she had notified Dr. Lindi Adie or his nurse of her decision to not begin treatment. She states, "I just made this decision last night, I haven't let them know yet." I asked if I may notify Dr. Lindi Adie and she states that would be fine to notify him and/or his nurse. I thanked her for time and encouraged her to keep my direct contact information should future needs or questions arise: she verbalizes understanding and her appreciation.  04/18/2020 10:15AM  DR. [place] NOTIFIED: Immediately upon completion of my call with Patricia Alvarado, I called Dr. Geralyn Flash office and spoke with Heinz Knuckles, RN, and advised her that Patricia Alvarado has decided not to start the anastrozole treatment. Patricia Alvarado verbalizes understanding and will notify Dr. Lindi Adie.  Current plan is to complete an UPBEAT declination form per protocol. In the event Patricia Alvarado decides to proceed with the recommended treatment, her eligibility for the UPBEAT study has been confirmed by myself and verified by clinical research nurse Doristine Johns.   Patricia Alvarado. Patricia Alvarado, BSN, RN,  CIC 04/18/2020 10:35 AM

## 2020-04-19 ENCOUNTER — Ambulatory Visit
Admission: RE | Admit: 2020-04-19 | Discharge: 2020-04-19 | Disposition: A | Discharge status: Home / Self Care | Payer: PPO | Source: Ambulatory Visit | Attending: Radiation Oncology | Admitting: Radiation Oncology

## 2020-04-19 ENCOUNTER — Other Ambulatory Visit: Payer: Self-pay

## 2020-04-19 DIAGNOSIS — C50111 Malignant neoplasm of central portion of right female breast: Secondary | ICD-10-CM | POA: Diagnosis not present

## 2020-04-22 ENCOUNTER — Other Ambulatory Visit: Payer: Self-pay

## 2020-04-22 ENCOUNTER — Ambulatory Visit
Admission: RE | Admit: 2020-04-22 | Discharge: 2020-04-22 | Disposition: A | Discharge status: Home / Self Care | Payer: PPO | Source: Ambulatory Visit | Attending: Radiation Oncology | Admitting: Radiation Oncology

## 2020-04-22 DIAGNOSIS — C50111 Malignant neoplasm of central portion of right female breast: Secondary | ICD-10-CM | POA: Diagnosis not present

## 2020-04-23 ENCOUNTER — Encounter: Payer: Self-pay | Admitting: *Deleted

## 2020-04-23 ENCOUNTER — Ambulatory Visit
Admission: RE | Admit: 2020-04-23 | Discharge: 2020-04-23 | Disposition: A | Discharge status: Home / Self Care | Payer: PPO | Source: Ambulatory Visit | Attending: Radiation Oncology | Admitting: Radiation Oncology

## 2020-04-23 ENCOUNTER — Encounter: Payer: Self-pay | Admitting: Radiation Oncology

## 2020-04-23 DIAGNOSIS — C50111 Malignant neoplasm of central portion of right female breast: Secondary | ICD-10-CM | POA: Diagnosis not present

## 2020-05-06 DIAGNOSIS — H25813 Combined forms of age-related cataract, bilateral: Secondary | ICD-10-CM | POA: Diagnosis not present

## 2020-05-23 ENCOUNTER — Telehealth: Payer: Self-pay

## 2020-05-23 NOTE — Telephone Encounter (Signed)
I called the patient today about her upcoming follow-up appointment in radiation oncology.   Given the state of the COVID-19 pandemic, concerning case numbers in our community, and guidance from [facility], I offered a phone assessment with the patient to determine if coming to the clinic was necessary. She accepted.  I let the patient know that I had spoken with Dr. Isidore Moos, and she wanted them to know the importance of washing their hands for at least 20 seconds at a time, especially after going out in public, and before they eat.  Limit going out in public whenever possible. Do not touch your face, unless your hands are clean, such as when bathing. Get plenty of rest, eat well, and stay hydrated. Patient verbalized understanding and agreement.  The patient denies any symptomatic concerns. She reports her fatigue is improving, but she still tries to take things slow so she doesn't over exert herself. She denies any swelling to right breast and reports full range of motion to the right arm. She report there is still some tenderness around her lumpectomy scar, but states it's tolerable. Specifically, she reports good healing of their skin in the radiation fields.  Skin is intact and almost back to baseline appearance. I recommended that she continue skin care by applying oil or lotion with vitamin E to the skin in the radiation fields, BID, for 2 more months.  Patient states she has already been using vitamin E oil and that's why her skin is looking so good. She has appointments in August with  Dr. Audelia Hives and her surgeon Dr. Autumn Messing.  Continue follow-up with medical oncology - follow-up is scheduled on 07/17/2020 with Annabelle Harman and the [facility].  I explained that yearly mammograms are important for patients with intact breast tissue, and physical exams are important after mastectomy for patients that cannot undergo mammography.  I encouraged her to call if she  had further questions or concerns about her healing. Otherwise, she will follow-up PRN in radiation oncology. Patient is pleased with this plan, and we will cancel her upcoming follow-up to reduce the risk of COVID-19 transmission.

## 2020-05-24 ENCOUNTER — Ambulatory Visit: Payer: PPO | Admitting: Radiation Oncology

## 2020-05-27 ENCOUNTER — Encounter (INDEPENDENT_AMBULATORY_CARE_PROVIDER_SITE_OTHER): Payer: Self-pay

## 2020-06-03 ENCOUNTER — Encounter (INDEPENDENT_AMBULATORY_CARE_PROVIDER_SITE_OTHER): Payer: Self-pay

## 2020-06-14 ENCOUNTER — Encounter: Payer: Self-pay | Admitting: Plastic Surgery

## 2020-06-14 ENCOUNTER — Ambulatory Visit (INDEPENDENT_AMBULATORY_CARE_PROVIDER_SITE_OTHER): Payer: PPO | Admitting: Plastic Surgery

## 2020-06-14 ENCOUNTER — Other Ambulatory Visit: Payer: Self-pay

## 2020-06-14 VITALS — BP 141/76 | HR 77 | Temp 97.7°F | Ht 65.0 in | Wt 159.6 lb

## 2020-06-14 DIAGNOSIS — Z170 Estrogen receptor positive status [ER+]: Secondary | ICD-10-CM

## 2020-06-14 DIAGNOSIS — C50111 Malignant neoplasm of central portion of right female breast: Secondary | ICD-10-CM

## 2020-06-14 DIAGNOSIS — I480 Paroxysmal atrial fibrillation: Secondary | ICD-10-CM | POA: Diagnosis not present

## 2020-06-14 NOTE — Progress Notes (Signed)
Patient ID: Patricia Alvarado, female    DOB: [birth year], 74 y.o.   MRN: [id number]   Chief Complaint  Patient presents with  . Consult  . Breast Cancer    The patient is a 74 year old white female here for a consultation for breast reconstruction.  The patient underwent bilateral mastectomies for right-sided breast cancer in 1992.  She was reconstructed with implants.  She then had a recurrence this year and underwent a further resection with removal of her nipple areola by Dr. Marlou Starks.  She finished her radiation in June.  She still has the nipple areola on the left with an implant in place.  The implant is a 350 cc saline implant.  She is 5 feet 5 inches tall and weighs 159 pounds.  She does not have any reconstruction on the right side.  She is interested in reconstruction that will make her more symmetric.  She does not want anything put under her muscle.  She notices a little bit of tightness with raising her right arm due to the radiation.  The effects of the radiation are still noted on her scan she has a nice looking reconstruction on the left.  The scars are healing nicely and no sign of infection.   Review of Systems  Constitutional: Negative.  Negative for activity change and appetite change.  HENT: Negative.   Eyes: Negative.   Respiratory: Positive for chest tightness. Negative for shortness of breath.   Cardiovascular: Negative.   Gastrointestinal: Negative.  Negative for abdominal pain.  Endocrine: Negative.   Musculoskeletal: Negative.   Neurological: Negative.   Hematological: Negative.   Psychiatric/Behavioral: Negative.     Past Medical History:  Diagnosis Date  . Basal cell carcinoma 2001   "forehead, between eyebrows"  . Carcinoma of thyroid gland ([place]) 2001  . Chronic lower back pain    "worse is across my hips" (05/26/2016)  . Depression   . Dyslipidemia   . Family history of breast cancer   . Family history of kidney cancer   . Family history of leukemia     . Family history of nonmelanoma skin cancer   . Fibrocystic breast   . Fibromyalgia   . Heart murmur   . History of blood transfusion 1992   "after subcutaneous mastectomies"  . Hypothyroidism   . Malignant melanoma of left ankle ([place]) 2001  . Migraine    "visual; 2-3 times/year" (05/26/2016)  . Mitral valve prolapse   . PONV (postoperative nausea and vomiting)   . Ventricular tachycardia (HCC)    Hx of, controlled on sotalol therapy    Past Surgical History:  Procedure Laterality Date  . ABDOMINAL HYSTERECTOMY  1998  . ANTERIOR CERVICAL DECOMP/DISCECTOMY FUSION  2001  . BACK SURGERY    . BASAL CELL CARCINOMA EXCISION  2001   "cut it out & did a flap, on forehead between my eyebrows"  . BREAST IMPLANT EXCHANGE Bilateral 2001   [id number]  . BREAST IMPLANT REMOVAL Right 02/05/2020   Procedure: REMOVAL RIGHT BREAST IMPLANT AND CAPSULECTOMY;  Surgeon: Jovita Kussmaul, MD;  Location: [facility];  Service: General;  Laterality: Right;  . BREAST LUMPECTOMY Right 02/05/2020   Procedure: RIGHT BREAST CENTRAL LUMPECTOMY;  Surgeon: Jovita Kussmaul, MD;  Location: [facility];  Service: General;  Laterality: Right;  . CARPAL TUNNEL RELEASE Right 2006  . COLONOSCOPY    . DILATION AND CURETTAGE OF UTERUS  1970s X 2-3  . ELECTROPHYSIOLOGIC  STUDY  1994 X 2;2001   "to see it it was sustained VT; cause thyroid levels were causing arrhythmias"  . LAPAROSCOPIC CHOLECYSTECTOMY    . MASTECTOMY Bilateral 1992   "subcutaneous"  . MELANOMA EXCISION Left 2001   "ankle, stage I"  . PLACEMENT OF BREAST IMPLANTS Bilateral 1992   [id number]  . TOTAL THYROIDECTOMY  12/1999   "cancer"  . VENTRICULAR ABLATION SURGERY  2011   ventricular tchycardia      Current Outpatient Medications:  .  acetaminophen (TYLENOL) 500 MG tablet, Take 500-1,000 mg by mouth every 6 (six) hours as needed for mild pain or moderate pain., Disp: , Rfl:  .  ALPRAZolam (XANAX) 0.5 MG tablet, Take 0.5 mg  by mouth at bedtime., Disp: , Rfl:  .  apixaban (ELIQUIS) 5 MG TABS tablet, Take 1 tablet (5 mg total) by mouth 2 (two) times daily., Disp: 180 tablet, Rfl: 1 .  Ascorbic Acid (VITAMIN C) 1000 MG tablet, Take 1 tablet by mouth daily., Disp: , Rfl:  .  aspirin 81 MG EC tablet, Take 81 mg by mouth daily.  , Disp: , Rfl:  .  B Complex-C (SUPER B COMPLEX PO), Take 1 capsule by mouth at bedtime., Disp: , Rfl:  .  Biotin 5 MG CAPS, Take 5 mg by mouth at bedtime. , Disp: , Rfl:  .  bisacodyl (DULCOLAX) 5 MG EC tablet, Take 5 mg by mouth daily., Disp: , Rfl:  .  Calcium 600-200 MG-UNIT tablet, Take 1 tablet by mouth daily., Disp: , Rfl:  .  cetirizine (ZYRTEC) 10 MG tablet, Take 10 mg by mouth daily as needed for allergies. , Disp: , Rfl:  .  Coenzyme Q10 (COQ10) 100 MG CAPS, Take 100 mg by mouth daily. , Disp: , Rfl:  .  diltiazem (DILT-XR) 120 MG 24 hr capsule, Take 1 capsule (120 mg total) by mouth daily. (Patient taking differently: Take 120 mg by mouth at bedtime. ), Disp: 90 capsule, Rfl: 3 .  escitalopram (LEXAPRO) 20 MG tablet, Take 20 mg by mouth daily.  , Disp: , Rfl:  .  flecainide (TAMBOCOR) 100 MG tablet, Take 1 tablet by mouth twice a day and may take an additional tablet daily for increased palpitations, Disp: 225 tablet, Rfl: 2 .  fluticasone (FLONASE) 50 MCG/ACT nasal spray, as needed., Disp: , Rfl:  .  MANGANESE PO, Take 40 mg by mouth at bedtime., Disp: , Rfl:  .  Melatonin 10 MG TABS, Take 10 mg by mouth at bedtime. , Disp: , Rfl:  .  ranolazine (RANEXA) 500 MG 12 hr tablet, Take 1 tablet (500 mg total) by mouth 2 (two) times daily., Disp: 180 tablet, Rfl: 3 .  Rosuvastatin Calcium (CRESTOR PO), Take 10 mg by mouth once a week. , Disp: , Rfl:  .  Thiamine Mononitrate (VITAMIN B1 PO), Take 1 tablet by mouth at bedtime. 100 mg, Disp: , Rfl:  .  thyroid (ARMOUR) 120 MG tablet, Take 120 mg by mouth daily before breakfast., Disp: , Rfl:  .  VITAMIN D, CHOLECALCIFEROL, PO, Take 5,000 Units  by mouth daily., Disp: , Rfl:  .  Zinc 50 MG TABS, Take 1 tablet by mouth daily., Disp: , Rfl:  .  anastrozole (ARIMIDEX) 1 MG tablet, Take 1 tablet (1 mg total) by mouth daily., Disp: 90 tablet, Rfl: 3 .  ibandronate (BONIVA) 150 MG tablet, Take 1 tablet (150 mg total) by mouth every 30 (thirty) days. Take in the morning with a  full glass of water, on an empty stomach, and do not take anything else by mouth or lie down for the next 30 min., Disp: 3 tablet, Rfl: 3 .  OVER THE COUNTER MEDICATION, Take 1,000 mg by mouth daily. Niacin time release OTC, Disp: , Rfl:    Objective:   Vitals:   06/14/20 1030  BP: (!) 141/76  Pulse: 77  Temp: 97.7 F (36.5 C)  SpO2: 96%    Physical Exam Vitals and nursing note reviewed.  Constitutional:      Appearance: Normal appearance.  HENT:     Head: Normocephalic and atraumatic.  Cardiovascular:     Rate and Rhythm: Normal rate.     Pulses: Normal pulses.  Pulmonary:     Effort: Pulmonary effort is normal.  Chest:    Abdominal:     General: Abdomen is flat.  Skin:    General: Skin is warm.  Neurological:     General: No focal deficit present.     Mental Status: She is alert and oriented to person, place, and time.  Psychiatric:        Mood and Affect: Mood normal.        Behavior: Behavior normal.        Thought Content: Thought content normal.     Assessment & Plan:  Malignant neoplasm of central portion of right breast in female, estrogen receptor positive (HCC)  Paroxysmal atrial fibrillation (HCC)  We had a long discussion about the options for reconstruction.  These include autologous reconstruction and implant-based reconstruction.  The patient agrees that at this point in time the autologous reconstruction is not a great option for her.  The latissimus would have been a possibility but it is not a great option especially to use the right side.  She is hesitant to put anything under the muscle.  Placing an expander on the right  side and then an implant is an option.  Its not a great option because of the risk of exposure due to her radiation.  We can remove the right implant and go smaller.  We could also remove it completely and remove the nipple areola as she is thinking about doing.  My strong recommendation is not to do any surgery before 6 months of her last radiation treatment.  I would like to see her back in January to discuss this further in the meantime I have given her a prescription for [employer] [facility] [employer] and recommend that she go see them so that she can get an implant placed for her bra and have symmetry in the interim.  The patient agrees with this plan.  I spoke with Dr. Marlou Starks and told him about the above information and plan.  Pictures were obtained of the patient and placed in the chart with the patient's or guardian's permission.   [place], DO

## 2020-06-17 ENCOUNTER — Encounter (INDEPENDENT_AMBULATORY_CARE_PROVIDER_SITE_OTHER): Payer: Self-pay

## 2020-06-17 ENCOUNTER — Telehealth: Payer: Self-pay | Admitting: *Deleted

## 2020-06-17 DIAGNOSIS — C50911 Malignant neoplasm of unspecified site of right female breast: Secondary | ICD-10-CM | POA: Diagnosis not present

## 2020-06-17 DIAGNOSIS — G4700 Insomnia, unspecified: Secondary | ICD-10-CM | POA: Diagnosis not present

## 2020-06-17 DIAGNOSIS — F329 Major depressive disorder, single episode, unspecified: Secondary | ICD-10-CM | POA: Diagnosis not present

## 2020-06-17 DIAGNOSIS — E039 Hypothyroidism, unspecified: Secondary | ICD-10-CM | POA: Diagnosis not present

## 2020-06-17 DIAGNOSIS — I480 Paroxysmal atrial fibrillation: Secondary | ICD-10-CM | POA: Diagnosis not present

## 2020-06-17 DIAGNOSIS — R7309 Other abnormal glucose: Secondary | ICD-10-CM | POA: Diagnosis not present

## 2020-06-17 DIAGNOSIS — M797 Fibromyalgia: Secondary | ICD-10-CM | POA: Diagnosis not present

## 2020-06-17 DIAGNOSIS — E785 Hyperlipidemia, unspecified: Secondary | ICD-10-CM | POA: Diagnosis not present

## 2020-06-17 DIAGNOSIS — Z6826 Body mass index (BMI) 26.0-26.9, adult: Secondary | ICD-10-CM | POA: Diagnosis not present

## 2020-06-17 DIAGNOSIS — I10 Essential (primary) hypertension: Secondary | ICD-10-CM | POA: Diagnosis not present

## 2020-06-17 NOTE — Telephone Encounter (Signed)
Faxed order for mastectomy supplies on (06/14/20) to [facility] along with demographics,copy of insurance card, and recent office notes.  Confirmation received and copy of order scanned into the chart.//AB/CMA

## 2020-06-20 ENCOUNTER — Ambulatory Visit (INDEPENDENT_AMBULATORY_CARE_PROVIDER_SITE_OTHER): Payer: PPO | Admitting: Internal Medicine

## 2020-06-20 ENCOUNTER — Other Ambulatory Visit: Payer: Self-pay

## 2020-06-20 ENCOUNTER — Encounter: Payer: Self-pay | Admitting: Internal Medicine

## 2020-06-20 VITALS — BP 146/68 | HR 77 | Ht 64.5 in | Wt 159.4 lb

## 2020-06-20 DIAGNOSIS — I10 Essential (primary) hypertension: Secondary | ICD-10-CM | POA: Diagnosis not present

## 2020-06-20 DIAGNOSIS — I493 Ventricular premature depolarization: Secondary | ICD-10-CM

## 2020-06-20 DIAGNOSIS — I480 Paroxysmal atrial fibrillation: Secondary | ICD-10-CM

## 2020-06-20 DIAGNOSIS — R0600 Dyspnea, unspecified: Secondary | ICD-10-CM

## 2020-06-20 DIAGNOSIS — I4729 Other ventricular tachycardia: Secondary | ICD-10-CM

## 2020-06-20 DIAGNOSIS — I472 Ventricular tachycardia: Secondary | ICD-10-CM

## 2020-06-20 NOTE — Patient Instructions (Addendum)
Medication Instructions:  Your physician recommends that you continue on your current medications as directed. Please refer to the Current Medication list given to you today.  Labwork: None ordered.  Testing/Procedures: Your physician has requested that you have an echocardiogram. Echocardiography is a painless test that uses sound waves to create images of your heart. It provides your doctor with information about the size and shape of your heart and how well your heart's chambers and valves are working. This procedure takes approximately one hour. There are no restrictions for this procedure.  Please schedule for ECHO  Follow-Up: Your physician wants you to follow-up in: based on results of echo   Any Other Special Instructions Will Be Listed Below (If Applicable).  If you need a refill on your cardiac medications before your next appointment, please call your pharmacy.

## 2020-06-20 NOTE — Progress Notes (Signed)
HPI Patricia Alvarado returns today for followup. She is a pleasant 74 yo woman with  H/o NSVT, HTN, and PVC's. She has developed recurrent breast CA in the interim. She also had atrial fib. She has done well on flecainide. She does c/o dyspnea with exertion and fatigue.  Allergies  Allergen Reactions  . Quinidine Other (See Comments)    REACTION: increased heart rate  . Dofetilide Other (See Comments)    Elevated QTCs  . Vicodin  [Hydrocodone-Acetaminophen]   . Amoxicillin Itching and Rash  . Atenolol Hives  . Atorvastatin Other (See Comments)    REACTION: muscles pain  . Latex Other (See Comments)    Raw, rash and blisters - Tape only  . Nadolol Hives     Current Outpatient Medications  Medication Sig Dispense Refill  . acetaminophen (TYLENOL) 500 MG tablet Take 500-1,000 mg by mouth every 6 (six) hours as needed for mild pain or moderate pain.    Marland Kitchen ALPRAZolam (XANAX) 0.5 MG tablet Take 0.5 mg by mouth at bedtime.    Marland Kitchen apixaban (ELIQUIS) 5 MG TABS tablet Take 1 tablet (5 mg total) by mouth 2 (two) times daily. 180 tablet 1  . Ascorbic Acid (VITAMIN C) 1000 MG tablet Take 1 tablet by mouth daily.    Marland Kitchen aspirin 81 MG EC tablet Take 81 mg by mouth daily.      . B Complex-C (SUPER B COMPLEX PO) Take 1 capsule by mouth at bedtime.    . Biotin 5 MG CAPS Take 5 mg by mouth at bedtime.     . bisacodyl (DULCOLAX) 5 MG EC tablet Take 5 mg by mouth daily.    . Calcium 600-200 MG-UNIT tablet Take 1 tablet by mouth daily.    . cetirizine (ZYRTEC) 10 MG tablet Take 10 mg by mouth daily as needed for allergies.     . Coenzyme Q10 (COQ10) 100 MG CAPS Take 100 mg by mouth daily.     Marland Kitchen diltiazem (DILT-XR) 120 MG 24 hr capsule Take 1 capsule (120 mg total) by mouth daily. 90 capsule 3  . escitalopram (LEXAPRO) 20 MG tablet Take 20 mg by mouth daily.      . flecainide (TAMBOCOR) 100 MG tablet Take 1 tablet by mouth twice a day and may take an additional tablet daily for increased palpitations  225 tablet 2  . fluticasone (FLONASE) 50 MCG/ACT nasal spray as needed.    Marland Kitchen MANGANESE PO Take 40 mg by mouth at bedtime.    . Melatonin 10 MG TABS Take 10 mg by mouth at bedtime.     . ranolazine (RANEXA) 500 MG 12 hr tablet Take 1 tablet (500 mg total) by mouth 2 (two) times daily. 180 tablet 3  . Rosuvastatin Calcium (CRESTOR PO) Take 10 mg by mouth once a week.     . Thiamine Mononitrate (VITAMIN B1 PO) Take 1 tablet by mouth at bedtime. 100 mg    . thyroid (ARMOUR) 120 MG tablet Take 120 mg by mouth daily before breakfast.    . VITAMIN D, CHOLECALCIFEROL, PO Take 5,000 Units by mouth daily.    . Zinc 50 MG TABS Take 1 tablet by mouth daily.    Marland Kitchen MAGNESIUM MALATE PO Take 1 tablet by mouth daily.     No current facility-administered medications for this visit.     Past Medical History:  Diagnosis Date  . Basal cell carcinoma 2001   "forehead, between eyebrows"  . Carcinoma of  thyroid gland ([place]) 2001  . Chronic lower back pain    "worse is across my hips" (05/26/2016)  . Depression   . Dyslipidemia   . Family history of breast cancer   . Family history of kidney cancer   . Family history of leukemia   . Family history of nonmelanoma skin cancer   . Fibrocystic breast   . Fibromyalgia   . Heart murmur   . History of blood transfusion 1992   "after subcutaneous mastectomies"  . Hypothyroidism   . Malignant melanoma of left ankle ([place]) 2001  . Migraine    "visual; 2-3 times/year" (05/26/2016)  . Mitral valve prolapse   . PONV (postoperative nausea and vomiting)   . Ventricular tachycardia (HCC)    Hx of, controlled on sotalol therapy    ROS:   All systems reviewed and negative except as noted in the HPI.   Past Surgical History:  Procedure Laterality Date  . ABDOMINAL HYSTERECTOMY  1998  . ANTERIOR CERVICAL DECOMP/DISCECTOMY FUSION  2001  . BACK SURGERY    . BASAL CELL CARCINOMA EXCISION  2001   "cut it out & did a flap, on forehead between my eyebrows"  . BREAST  IMPLANT EXCHANGE Bilateral 2001   [id number]  . BREAST IMPLANT REMOVAL Right 02/05/2020   Procedure: REMOVAL RIGHT BREAST IMPLANT AND CAPSULECTOMY;  Surgeon: Jovita Kussmaul, MD;  Location: [facility];  Service: General;  Laterality: Right;  . BREAST LUMPECTOMY Right 02/05/2020   Procedure: RIGHT BREAST CENTRAL LUMPECTOMY;  Surgeon: Jovita Kussmaul, MD;  Location: [facility];  Service: General;  Laterality: Right;  . CARPAL TUNNEL RELEASE Right 2006  . COLONOSCOPY    . DILATION AND CURETTAGE OF UTERUS  1970s X 2-3  . ELECTROPHYSIOLOGIC STUDY  1994 X 2;2001   "to see it it was sustained VT; cause thyroid levels were causing arrhythmias"  . LAPAROSCOPIC CHOLECYSTECTOMY    . MASTECTOMY Bilateral 1992   "subcutaneous"  . MELANOMA EXCISION Left 2001   "ankle, stage I"  . PLACEMENT OF BREAST IMPLANTS Bilateral 1992   [id number]  . TOTAL THYROIDECTOMY  12/1999   "cancer"  . VENTRICULAR ABLATION SURGERY  2011   ventricular tchycardia     Family History  Problem Relation Age of Onset  . Other Brother        AGENT ORANGE and AntiLupus  . Heart disease Brother        Stents and bypass x2  . Arrhythmia Brother        AFIB  . Heart attack Brother   . Hypertension Brother   . Squamous cell carcinoma Brother 20       Skin  . Hyperlipidemia Mother 62  . Osteoporosis Mother   . Basal cell carcinoma Mother 4  . Heart disease Father 74  . Other Father        Cardiac arrest  . Breast cancer Maternal Grandmother 85       metastatic  . Breast cancer Other        dx. unknown age; maternal great-aunt  . Lung cancer Maternal Aunt 75       hx. of smoking  . Other Paternal Aunt        bilateral mastectomies due to multiple breast lumps  . Leukemia Maternal Aunt   . Cancer Maternal Aunt        unknown types  . Thyroid cancer Neg Hx      Social History   Socioeconomic  History  . Marital status: Married    Spouse name: Not on file  . Number of children: Not  on file  . Years of education: Not on file  . Highest education level: Not on file  Occupational History  . Occupation: Financial controller GI    Employer: [employer]  Tobacco Use  . Smoking status: Never Smoker  . Smokeless tobacco: Never Used  Vaping Use  . Vaping Use: Never used  Substance and Sexual Activity  . Alcohol use: No  . Drug use: No  . Sexual activity: Yes  Other Topics Concern  . Not on file  Social History Narrative   Married   Social Determinants of Health   Financial Resource Strain:   . Difficulty of Paying Living Expenses: Not on file  Food Insecurity:   . Worried About Charity fundraiser in the Last Year: Not on file  . Ran Out of Food in the Last Year: Not on file  Transportation Needs:   . Lack of Transportation (Medical): Not on file  . Lack of Transportation (Non-Medical): Not on file  Physical Activity:   . Days of Exercise per Week: Not on file  . Minutes of Exercise per Session: Not on file  Stress:   . Feeling of Stress : Not on file  Social Connections:   . Frequency of Communication with Friends and Family: Not on file  . Frequency of Social Gatherings with Friends and Family: Not on file  . Attends Religious Services: Not on file  . Active Member of Clubs or Organizations: Not on file  . Attends Archivist Meetings: Not on file  . Marital Status: Not on file  Intimate Partner Violence:   . Fear of Current or Ex-Partner: Not on file  . Emotionally Abused: Not on file  . Physically Abused: Not on file  . Sexually Abused: Not on file     BP (!) 146/68   Pulse 77   Ht 5' 4.5" (1.638 m)   Wt 159 lb 6.4 oz (72.3 kg)   SpO2 95%   BMI 26.94 kg/m   Physical Exam:  Well appearing NAD HEENT: Unremarkable Neck:  No JVD, no thyromegally Lymphatics:  No adenopathy Back:  No CVA tenderness Lungs:  Clear with no wheezes HEART:  Regular rate rhythm, no murmurs, no rubs, no clicks Abd:  soft, positive bowel sounds, no organomegally, no  rebound, no guarding Ext:  2 plus pulses, no edema, no cyanosis, no clubbing Skin:  No rashes no nodules Neuro:  CN II through XII intact, motor grossly intact  EKG - nsr  Assess/Plan: 1. PAF - she is maintaining NSR on flecainide.  2. PVC's - she appears to have minimal PVC's on flecainide 3. Dyspnea - unclear if she has developed any LV dysfunction by echo. I have asked her to undergo 2D echo.  4. HTN - her bp is mildly elevated. Pending the echo, might consider adding a diuretic.  Mikle Bosworth.D

## 2020-06-21 DIAGNOSIS — C50911 Malignant neoplasm of unspecified site of right female breast: Secondary | ICD-10-CM | POA: Diagnosis not present

## 2020-06-24 ENCOUNTER — Encounter (INDEPENDENT_AMBULATORY_CARE_PROVIDER_SITE_OTHER): Payer: Self-pay

## 2020-06-24 DIAGNOSIS — C50911 Malignant neoplasm of unspecified site of right female breast: Secondary | ICD-10-CM | POA: Diagnosis not present

## 2020-06-25 NOTE — Progress Notes (Signed)
  Patient Name: Patricia Alvarado MRN: [id number] DOB: [birth year] Referring Physician: Nicholas Lose (Profile Not Attached) Date of Service: 04/23/2020 [facility], [place]                                                        End Of Treatment Note  Diagnoses: C50.111-Malignant neoplasm of central portion of right female breast  Cancer Staging: Cancer Staging Malignant neoplasm of central portion of right breast in female, estrogen receptor positive ([place]) Staging form: Breast, AJCC 8th Edition - Pathologic: Stage IA (pT2, pN0, cM0, G2, ER+, PR+, HER2-) - Signed by Gardenia Phlegm, NP on 06/09/2020   Intent: Curative  Radiation Treatment Dates: 03/12/2020 through 04/23/2020 Site Technique Total Dose (Gy) Dose per Fx (Gy) Completed Fx Beam Energies  Chest Wall, Right: CW_Rt_CW_IM 3D 50/50 2 25/25 10X  Chest Wall, Right: CW_Rt_PAB_SCV 3D 50/50 2 25/25 6X, 10X  Chest Wall, Right: CW_Rt_Bst Electron 10/10 2 5/5 6E   Narrative: The patient tolerated radiation therapy relatively well.   Plan: The patient will follow-up with radiation oncology in 1 mo, or as needed.Marland Kitchen -----------------------------------  Eppie Gibson, MD

## 2020-07-01 ENCOUNTER — Encounter (INDEPENDENT_AMBULATORY_CARE_PROVIDER_SITE_OTHER): Payer: Self-pay

## 2020-07-09 ENCOUNTER — Ambulatory Visit (HOSPITAL_COMMUNITY): Payer: PPO | Attending: Cardiovascular Disease

## 2020-07-09 ENCOUNTER — Encounter: Payer: Self-pay | Admitting: Rehabilitation

## 2020-07-09 ENCOUNTER — Ambulatory Visit: Payer: PPO | Attending: Plastic Surgery | Admitting: Rehabilitation

## 2020-07-09 ENCOUNTER — Other Ambulatory Visit: Payer: Self-pay

## 2020-07-09 DIAGNOSIS — L599 Disorder of the skin and subcutaneous tissue related to radiation, unspecified: Secondary | ICD-10-CM | POA: Diagnosis not present

## 2020-07-09 DIAGNOSIS — I10 Essential (primary) hypertension: Secondary | ICD-10-CM | POA: Diagnosis not present

## 2020-07-09 DIAGNOSIS — Z483 Aftercare following surgery for neoplasm: Secondary | ICD-10-CM | POA: Insufficient documentation

## 2020-07-09 DIAGNOSIS — I480 Paroxysmal atrial fibrillation: Secondary | ICD-10-CM | POA: Diagnosis not present

## 2020-07-09 DIAGNOSIS — R0600 Dyspnea, unspecified: Secondary | ICD-10-CM | POA: Insufficient documentation

## 2020-07-09 LAB — ECHOCARDIOGRAM COMPLETE
Area-P 1/2: 4.29 cm2
Calc EF: 49.8 %
MV M vel: 5.6 m/s
MV Peak grad: 125.4 mmHg
P 1/2 time: 600 msec
S' Lateral: 4 cm
Single Plane A2C EF: 51.9 %
Single Plane A4C EF: 46.8 %

## 2020-07-09 NOTE — Patient Instructions (Signed)
Access Code: [email or web address]: [email or web address]: 09/14/2021Prepared by: Marcene Brawn TevisExercises  Doorway Pec Stretch at 90 Degrees Abduction - 1 x daily - 7 x weekly - 1-3 sets - 3 reps - 20-30 seconds hold  Open Book Chest Stretch on Towel Roll - 1 x daily - 7 x weekly - 1-3 sets - 3 reps - 20-30 seconds hold  Supine Thoracic Mobilization Foam Roll Horizontal with Arm Stretch - 1 x daily - 7 x weekly - 1-3 sets - 3 reps - 20-30 seconds hold  Standing Row with Anchored Resistance - 1 x daily - 3-4 x weekly - 1-3 sets - 10 reps - 2-3 second hold

## 2020-07-09 NOTE — Therapy (Signed)
[place], [place], [address] Phone: [phone]   Fax:  [phone]  Physical Therapy Evaluation  Patient Details  Name: Patricia Alvarado MRN: [id number] Date of Birth: 02-05-1946 Referring Provider (PT): Dr. Marla Roe    Encounter Date: 07/09/2020   PT End of Session - 07/09/20 1449    Visit Number 1    Number of Visits 1    PT Start Time 1401    PT Stop Time 1447    PT Time Calculation (min) 46 min    Activity Tolerance Patient tolerated treatment well    Behavior During Therapy [facility] for tasks assessed/performed           Past Medical History:  Diagnosis Date  . Basal cell carcinoma 2001   "forehead, between eyebrows"  . Carcinoma of thyroid gland ([place]) 2001  . Chronic lower back pain    "worse is across my hips" (05/26/2016)  . Depression   . Dyslipidemia   . Family history of breast cancer   . Family history of kidney cancer   . Family history of leukemia   . Family history of nonmelanoma skin cancer   . Fibrocystic breast   . Fibromyalgia   . Heart murmur   . History of blood transfusion 1992   "after subcutaneous mastectomies"  . Hypothyroidism   . Malignant melanoma of left ankle ([place] [facility]) 2001  . Migraine    "visual; 2-3 times/year" (05/26/2016)  . Mitral valve prolapse   . PONV (postoperative nausea and vomiting)   . Ventricular tachycardia (HCC)    Hx of, controlled on sotalol therapy    Past Surgical History:  Procedure Laterality Date  . ABDOMINAL HYSTERECTOMY  1998  . ANTERIOR CERVICAL DECOMP/DISCECTOMY FUSION  2001  . BACK SURGERY    . BASAL CELL CARCINOMA EXCISION  2001   "cut it out & did a flap, on forehead between my eyebrows"  . BREAST IMPLANT EXCHANGE Bilateral 2001   [id number]  . BREAST IMPLANT REMOVAL Right 02/05/2020   Procedure: REMOVAL RIGHT BREAST IMPLANT AND CAPSULECTOMY;  Surgeon: Jovita Kussmaul, MD;  Location: [facility];  Service: General;   Laterality: Right;  . BREAST LUMPECTOMY Right 02/05/2020   Procedure: RIGHT BREAST CENTRAL LUMPECTOMY;  Surgeon: Jovita Kussmaul, MD;  Location: [facility];  Service: General;  Laterality: Right;  . CARPAL TUNNEL RELEASE Right 2006  . COLONOSCOPY    . DILATION AND CURETTAGE OF UTERUS  1970s X 2-3  . ELECTROPHYSIOLOGIC STUDY  1994 X 2;2001   "to see it it was sustained VT; cause thyroid levels were causing arrhythmias"  . LAPAROSCOPIC CHOLECYSTECTOMY    . MASTECTOMY Bilateral 1992   "subcutaneous"  . MELANOMA EXCISION Left 2001   "ankle, stage I"  . PLACEMENT OF BREAST IMPLANTS Bilateral 1992   [id number]  . TOTAL THYROIDECTOMY  12/1999   "cancer"  . VENTRICULAR ABLATION SURGERY  2011   ventricular tchycardia    There were no vitals filed for this visit.    Subjective Assessment - 07/09/20 1359    Subjective The only think I feel is tightness in the shoulder with reaching up.    Pertinent History history of bilateral simple mastectomy with implants in 1992. New occurrence of Rt ILC resulting in new lumpectomy with no lymph nodes removed.  Radiation completed in June 33 sessions.  other history includes osteopenia, cervical fusion, fibromyalgia, cardiac arythmias chronic.  Will be getting bilateral new implants eventually  Limitations --   nothing   Patient Stated Goals is there anything else I can do    Currently in Pain? No/denies   it can be tender to touch             [facility] PT Assessment - 07/09/20 0001      Assessment   Medical Diagnosis Rt breast cancer     Referring Provider (PT) Dr. Marla Roe     Onset Date/Surgical Date 02/05/20    Hand Dominance Right    Prior Therapy no      Precautions   Precaution Comments cancer history      Restrictions   Weight Bearing Restrictions No      Balance Screen   Has the patient fallen in the past 6 months No    Has the patient had a decrease in activity level because of a fear of falling?  No    Is the  patient reluctant to leave their home because of a fear of falling?  No      Home Ecologist residence    Living Arrangements Spouse/significant other    Available Help at Discharge Family      Prior Function   Level of [place] Retired    Leisure I have a hard time exercising due to Afib       Cognition   Overall Cognitive Status Within Functional Limits for tasks assessed      Observation/Other Assessments   Observations healed mastectomy incision bilaterally with implant missing on the Rt and well healed new incision and no redness from radiation      Coordination   Gross Motor Movements are Fluid and Coordinated Yes      Posture/Postural Control   Posture/Postural Control Postural limitations    Postural Limitations Rounded Shoulders;Forward head      ROM / Strength   AROM / PROM / Strength AROM;PROM;Strength      AROM   AROM Assessment Site Shoulder    Right/Left Shoulder Right;Left    Right Shoulder Flexion 162 Degrees   with pull   Right Shoulder ABduction 172 Degrees    Right Shoulder Internal Rotation 75 Degrees    Right Shoulder External Rotation 85 Degrees    Right Shoulder Horizontal ABduction 35 Degrees    Left Shoulder Flexion 161 Degrees    Left Shoulder ABduction 172 Degrees    Left Shoulder Internal Rotation 75 Degrees    Left Shoulder External Rotation 85 Degrees      Strength   Overall Strength Comments pt reports left shoulder is 10% weaker after cervical fusion    Strength Assessment Site Shoulder    Right/Left Shoulder Right;Left    Right Shoulder Flexion 4+/5    Right Shoulder Extension 4+/5    Right Shoulder ABduction 4/5    Right Shoulder Internal Rotation 4+/5    Right Shoulder External Rotation 4+/5    Left Shoulder Flexion 4/5    Left Shoulder Extension 4/5    Left Shoulder ABduction 4-/5    Left Shoulder Internal Rotation 4/5    Left Shoulder External Rotation 4/5                       Objective measurements completed on examination: See above findings.       [facility] Adult PT Treatment/Exercise - 07/09/20 0001      Self-Care   Self-Care Other Self-Care Comments    Other  Self-Care Comments  educated pt on less than 1% chance of lymphedema development due to no lymph nodes ever removed but history of radiation and how to self monitor       Exercises   Exercises Shoulder      Shoulder Exercises: Standing   Row Both;10 reps    Theraband Level (Shoulder Row) Level 1 (Yellow)    Row Limitations advised to do without resistance if fibromyalgia flare up      Shoulder Exercises: Stretch   Corner Stretch 1 rep;30 seconds    Corner Stretch Limitations doorway with education on double and single arm stretches at various angles    Wall Stretch - Flexion Limitations no stretch felt    Other Shoulder Stretches supine T and W arms                   PT Education - 07/09/20 1449    Education Details initial HEP    Person(s) Educated Patient    Methods Explanation;Demonstration;Tactile cues;Handout;Verbal cues    Comprehension Verbalized understanding;Returned demonstration;Verbal cues required               PT Long Term Goals - 07/09/20 1453      PT LONG TERM GOAL #1   Title Pt will be ind with HEP for decreased muscle pull during overhead motion    Time 1    Period Days    Status Achieved                  Plan - 07/09/20 1449    Clinical Impression Statement Pt presents post removal of small section of breast tissue after simple mastectomy about 30 years ago with no lymph node removal and history of radiation.  Pt with excellent ROM equaling the opposite side with mild pulling in the pectoralis with overhead flexion horizonatl abduction.  Mild puffiness over the Rt pectoralis.  Pt was given advanced exercises to continue with education on how to follow up at anytime with any ROM or strength loss.    Personal Factors and  Comorbidities Age;Fitness;Comorbidity 2    Comorbidities fibromyalgia, cancer history/radiation    Stability/Clinical Decision Making Stable/Uncomplicated    Clinical Decision Making Low    Rehab Potential Excellent    PT Frequency One time visit    PT Treatment/Interventions ADLs/Self Care Home Management;Therapeutic exercise;Patient/family education    Consulted and Agree with Plan of Care Patient           Patient will benefit from skilled therapeutic intervention in order to improve the following deficits and impairments:  Increased fascial restricitons  Visit Diagnosis: Aftercare following surgery for neoplasm  Disorder of the skin and subcutaneous tissue related to radiation, unspecified     Problem List Patient Active Problem List   Diagnosis Date Noted  . Hypertension 06/20/2020  . Genetic testing 03/05/2020  . Family history of breast cancer   . Family history of nonmelanoma skin cancer   . Family history of leukemia   . Family history of kidney cancer   . Malignant neoplasm of central portion of right breast in female, estrogen receptor positive ([place]) 02/15/2020  . Breast mass, right 02/05/2020  . Afib ([place]) 05/26/2016  . PAF (paroxysmal atrial fibrillation) ([place]) 05/26/2016  . PVC (premature ventricular contraction) 11/27/2015  . MELANOMA, LEG, LEFT 12/02/2009  . CARCINOMA, BASAL CELL, FACE 12/02/2009  . CARCINOMA, THYROID GLAND, PAPILLARY 12/02/2009  . POSTSURGICAL HYPOTHYROIDISM 12/02/2009  . DEPRESSION 12/02/2009  . FIBROCYSTIC BREAST DISEASE 12/02/2009  .  FIBROMYALGIA 12/02/2009  . CARCINOMA, BASAL CELL, FACE 12/02/2009  . DYSLIPIDEMIA 08/29/2009  . VENTRICULAR TACHYCARDIA 08/29/2009    Stark Bray 07/09/2020, 2:53 PM  [place], [place], [address] Phone: [phone]   Fax:  [phone]  Name: Patricia Alvarado MRN: [id number] Date of Birth: [birth year]

## 2020-07-10 ENCOUNTER — Telehealth: Payer: Self-pay

## 2020-07-10 DIAGNOSIS — I4729 Other ventricular tachycardia: Secondary | ICD-10-CM

## 2020-07-10 MED ORDER — FUROSEMIDE 20 MG PO TABS: 20.0000 mg | ORAL_TABLET | Freq: Every day | ORAL | 3 refills | Status: DC

## 2020-07-10 NOTE — Telephone Encounter (Signed)
-----   Message from Evans Lance, MD sent at 07/09/2020  6:05 PM EDT ----- Normal LVEF. PFO.

## 2020-07-10 NOTE — Telephone Encounter (Signed)
Returned call to Pt.  Advised of echo results.  Discussed PFO-advised she is protected from clots on Eliquis.  Advised she could try lasix 20 mg one tablet by mouth daily for some diastolic dysfunction noted per Echo.  Pt in agreement.  Prescription sent.  Advised 6 mo f/u

## 2020-07-13 ENCOUNTER — Encounter (INDEPENDENT_AMBULATORY_CARE_PROVIDER_SITE_OTHER): Payer: Self-pay

## 2020-07-15 ENCOUNTER — Encounter (INDEPENDENT_AMBULATORY_CARE_PROVIDER_SITE_OTHER): Payer: Self-pay

## 2020-07-16 NOTE — Progress Notes (Signed)
SURVIVORSHIP VISIT:   BRIEF ONCOLOGIC HISTORY:  Oncology History  Malignant neoplasm of central portion of right breast in female, estrogen receptor positive ([place])  12/11/2018 Initial Diagnosis   Palpable right nipple mass, breast MRI revealed a 1.6 cm mass, internal mammary lymphadenopathy was noted, biopsy could not be performed because of the location.  Prior history of bilateral mastectomies for high risk disease.   02/05/2020 Surgery   Right central lumpectomy with implant Marlou Starks) [id number]): Grade 2 invasive lobular carcinoma, 2 cm, LCIS, lymphovascular invasion identified, perineural invasion identified. Less than 0.1 cm to the posterior margin. No regional lymph nodes were examined. ER 100%, PR 100%, HER-2 negative, Ki-67 1%   03/01/2020 Genetic Testing   Negative genetic testing:  No pathogenic variants detected on the [facility] Common Hereditary Cancers Panel. The report date is 03/01/2020.  The Common Hereditary Cancers Panel offered by [facility] includes sequencing and/or deletion duplication testing of the following 48 genes: APC, ATM, AXIN2, BARD1, BMPR1A, BRCA1, BRCA2, BRIP1, CDH1, CDK4, CDKN2A (p14ARF), CDKN2A (p16INK4a), CHEK2, CTNNA1, DICER1, EPCAM (Deletion/duplication testing only), GREM1 (promoter region deletion/duplication testing only), KIT, MEN1, MLH1, MSH2, MSH3, MSH6, MUTYH, NBN, NF1, NHTL1, PALB2, PDGFRA, PMS2, POLD1, POLE, PTEN, RAD50, RAD51C, RAD51D, RNF43, SDHB, SDHC, SDHD, SMAD4, SMARCA4. STK11, TP53, TSC1, TSC2, and VHL.  The following genes were evaluated for sequence changes only: SDHA and HOXB13 c.251G>A variant only.    03/05/2020 Cancer Staging   Staging form: Breast, AJCC 8th Edition - Pathologic: Stage IA (pT2, pN0, cM0, G2, ER+, PR+, HER2-)   03/13/2020 - 04/23/2020 Radiation Therapy   The patient initially received a dose of 50 Gy in 25 fractions to the breast and supraclavicular region using whole-breast tangent fields. This was delivered using a 3-D conformal  technique. The pt received a boost delivering an additional 10 Gy in 5 fractions using a electron boost with 44meV electrons. The total dose was 60 Gy.   04/2020 - 04/2027 Anti-estrogen oral therapy   Anastrozole     INTERVAL HISTORY:  Patricia Alvarado to review her survivorship care plan detailing her treatment course for breast cancer, as well as monitoring long-term side effects of that treatment, education regarding health maintenance, screening, and overall wellness and health promotion.     Overall, Patricia Alvarado reports feeling quite well.  She completed the survivorship survey and notes shortness of breath with exertion.  She underwent echo last week that was normal.  Dr. Lovena Le ordered her echo in cardiology.  She is due to see him again in 6 months.  She did have covid 07/2019 and wonders if this is residually related to having had covid 19.    Patricia Alvarado is fatigued and foggy headed.  She has also had hot flashes and hormone changes.  She is not taking Anastrozole daily.  She has opted not to take it.    REVIEW OF SYSTEMS:  Review of Systems  Constitutional: Positive for fatigue. Negative for appetite change, chills, fever and unexpected weight change.  HENT:   Negative for hearing loss, lump/mass, sore throat and trouble swallowing.   Eyes: Negative for eye problems and icterus.  Respiratory: Positive for shortness of breath. Negative for chest tightness and cough.   Cardiovascular: Negative for chest pain, leg swelling and palpitations.  Gastrointestinal: Negative for abdominal distention, abdominal pain, constipation, diarrhea, nausea and vomiting.  Endocrine: Positive for hot flashes.  Musculoskeletal: Negative for arthralgias.  Skin: Negative for itching and rash.  Neurological: Negative for dizziness, extremity weakness, headaches and numbness.  Hematological: Negative for adenopathy. Does not bruise/bleed easily.  Psychiatric/Behavioral: Positive for sleep disturbance (chronic).  Negative for depression. The patient is not nervous/anxious.   Breast: Denies any new nodularity, masses, tenderness, nipple changes, or nipple discharge.      ONCOLOGY TREATMENT TEAM:  1. Surgeon:  Dr. Marlou Starks at [facility] 2. Medical Oncologist: Dr. Lindi Adie  3. Radiation Oncologist: Dr. Isidore Moos    PAST MEDICAL/SURGICAL HISTORY:  Past Medical History:  Diagnosis Date  . Basal cell carcinoma 2001   "forehead, between eyebrows"  . Carcinoma of thyroid gland ([place]) 2001  . Chronic lower back pain    "worse is across my hips" (05/26/2016)  . Depression   . Dyslipidemia   . Family history of breast cancer   . Family history of kidney cancer   . Family history of leukemia   . Family history of nonmelanoma skin cancer   . Fibrocystic breast   . Fibromyalgia   . Heart murmur   . History of blood transfusion 1992   "after subcutaneous mastectomies"  . Hypothyroidism   . Malignant melanoma of left ankle ([place]) 2001  . Migraine    "visual; 2-3 times/year" (05/26/2016)  . Mitral valve prolapse   . PONV (postoperative nausea and vomiting)   . Ventricular tachycardia ([facility])    Hx of, controlled on sotalol therapy   Past Surgical History:  Procedure Laterality Date  . ABDOMINAL HYSTERECTOMY  1998  . ANTERIOR CERVICAL DECOMP/DISCECTOMY FUSION  2001  . BACK SURGERY    . BASAL CELL CARCINOMA EXCISION  2001   "cut it out & did a flap, on forehead between my eyebrows"  . BREAST IMPLANT EXCHANGE Bilateral 2001   [id number]  . BREAST IMPLANT REMOVAL Right 02/05/2020   Procedure: REMOVAL RIGHT BREAST IMPLANT AND CAPSULECTOMY;  Surgeon: Jovita Kussmaul, MD;  Location: [facility];  Service: General;  Laterality: Right;  . BREAST LUMPECTOMY Right 02/05/2020   Procedure: RIGHT BREAST CENTRAL LUMPECTOMY;  Surgeon: Jovita Kussmaul, MD;  Location: [facility];  Service: General;  Laterality: Right;  . CARPAL TUNNEL RELEASE Right 2006  . COLONOSCOPY    . DILATION  AND CURETTAGE OF UTERUS  1970s X 2-3  . ELECTROPHYSIOLOGIC STUDY  1994 X 2;2001   "to see it it was sustained VT; cause thyroid levels were causing arrhythmias"  . LAPAROSCOPIC CHOLECYSTECTOMY    . MASTECTOMY Bilateral 1992   "subcutaneous"  . MELANOMA EXCISION Left 2001   "ankle, stage I"  . PLACEMENT OF BREAST IMPLANTS Bilateral 1992   [id number]  . TOTAL THYROIDECTOMY  12/1999   "cancer"  . VENTRICULAR ABLATION SURGERY  2011   ventricular tchycardia     ALLERGIES:  Allergies  Allergen Reactions  . Quinidine Other (See Comments)    REACTION: increased heart rate  . Dofetilide Other (See Comments)    Elevated QTCs  . Vicodin  [Hydrocodone-Acetaminophen]   . Amoxicillin Itching and Rash  . Atenolol Hives  . Atorvastatin Other (See Comments)    REACTION: muscles pain  . Latex Other (See Comments)    Raw, rash and blisters - Tape only  . Nadolol Hives     CURRENT MEDICATIONS:  Outpatient Encounter Medications as of 07/17/2020  Medication Sig  . acetaminophen (TYLENOL) 500 MG tablet Take 500-1,000 mg by mouth every 6 (six) hours as needed for mild pain or moderate pain.  Marland Kitchen ALPRAZolam (XANAX) 0.5 MG tablet Take 0.5 mg by mouth at bedtime.  Marland Kitchen apixaban (ELIQUIS)  5 MG TABS tablet Take 1 tablet (5 mg total) by mouth 2 (two) times daily.  . Ascorbic Acid (VITAMIN C) 1000 MG tablet Take 1 tablet by mouth daily.  Marland Kitchen aspirin 81 MG EC tablet Take 81 mg by mouth daily.    . B Complex-C (SUPER B COMPLEX PO) Take 1 capsule by mouth at bedtime.  . Biotin 5 MG CAPS Take 5 mg by mouth at bedtime.   . bisacodyl (DULCOLAX) 5 MG EC tablet Take 5 mg by mouth daily.  . Calcium 600-200 MG-UNIT tablet Take 1 tablet by mouth daily.  . cetirizine (ZYRTEC) 10 MG tablet Take 10 mg by mouth daily as needed for allergies.   . Coenzyme Q10 (COQ10) 100 MG CAPS Take 100 mg by mouth daily.   Marland Kitchen diltiazem (DILT-XR) 120 MG 24 hr capsule Take 1 capsule (120 mg total) by mouth daily.  Marland Kitchen escitalopram (LEXAPRO)  20 MG tablet Take 20 mg by mouth daily.    . flecainide (TAMBOCOR) 100 MG tablet Take 1 tablet by mouth twice a day and may take an additional tablet daily for increased palpitations  . fluticasone (FLONASE) 50 MCG/ACT nasal spray as needed.  . furosemide (LASIX) 20 MG tablet Take 1 tablet (20 mg total) by mouth daily.  Marland Kitchen MAGNESIUM MALATE PO Take 1 tablet by mouth daily.  Marland Kitchen MANGANESE PO Take 40 mg by mouth at bedtime.  . Melatonin 10 MG TABS Take 10 mg by mouth at bedtime.   . ranolazine (RANEXA) 500 MG 12 hr tablet Take 1 tablet (500 mg total) by mouth 2 (two) times daily.  . Thiamine Mononitrate (VITAMIN B1 PO) Take 1 tablet by mouth at bedtime. 100 mg  . thyroid (ARMOUR) 120 MG tablet Take 120 mg by mouth daily before breakfast.  . VITAMIN D, CHOLECALCIFEROL, PO Take 5,000 Units by mouth daily.  . Zinc 50 MG TABS Take 1 tablet by mouth daily.  . [DISCONTINUED] Rosuvastatin Calcium (CRESTOR PO) Take 10 mg by mouth 2 (two) times a week.    No facility-administered encounter medications on file as of 07/17/2020.     ONCOLOGIC FAMILY HISTORY:  Family History  Problem Relation Age of Onset  . Other Brother        AGENT ORANGE and AntiLupus  . Heart disease Brother        Stents and bypass x2  . Arrhythmia Brother        AFIB  . Heart attack Brother   . Hypertension Brother   . Squamous cell carcinoma Brother 20       Skin  . Hyperlipidemia Mother 57  . Osteoporosis Mother   . Basal cell carcinoma Mother 22  . Heart disease Father 75  . Other Father        Cardiac arrest  . Breast cancer Maternal Grandmother 85       metastatic  . Breast cancer Other        dx. unknown age; maternal great-aunt  . Lung cancer Maternal Aunt 75       hx. of smoking  . Other Paternal Aunt        bilateral mastectomies due to multiple breast lumps  . Leukemia Maternal Aunt   . Cancer Maternal Aunt        unknown types  . Thyroid cancer Neg Hx      GENETIC COUNSELING/TESTING: See  above  SOCIAL HISTORY:  Social History   Socioeconomic History  . Marital status: Married  Spouse name: Not on file  . Number of children: Not on file  . Years of education: Not on file  . Highest education level: Not on file  Occupational History  . Occupation: Financial controller GI    Employer: [employer]  Tobacco Use  . Smoking status: Never Smoker  . Smokeless tobacco: Never Used  Vaping Use  . Vaping Use: Never used  Substance and Sexual Activity  . Alcohol use: No  . Drug use: No  . Sexual activity: Yes  Other Topics Concern  . Not on file  Social History Narrative   Married   Social Determinants of Health   Financial Resource Strain:   . Difficulty of Paying Living Expenses: Not on file  Food Insecurity:   . Worried About Charity fundraiser in the Last Year: Not on file  . Ran Out of Food in the Last Year: Not on file  Transportation Needs:   . Lack of Transportation (Medical): Not on file  . Lack of Transportation (Non-Medical): Not on file  Physical Activity:   . Days of Exercise per Week: Not on file  . Minutes of Exercise per Session: Not on file  Stress:   . Feeling of Stress : Not on file  Social Connections:   . Frequency of Communication with Friends and Family: Not on file  . Frequency of Social Gatherings with Friends and Family: Not on file  . Attends Religious Services: Not on file  . Active Member of Clubs or Organizations: Not on file  . Attends Archivist Meetings: Not on file  . Marital Status: Not on file  Intimate Partner Violence:   . Fear of Current or Ex-Partner: Not on file  . Emotionally Abused: Not on file  . Physically Abused: Not on file  . Sexually Abused: Not on file     OBSERVATIONS/OBJECTIVE:  BP 118/78 (BP Location: Left Arm, Patient Position: Sitting)   Pulse 75   Temp (!) 97.4 F (36.3 C) (Tympanic)   Resp 18   Ht 5' 4.5" (1.638 m)   Wt 160 lb 3.2 oz (72.7 kg)   SpO2 99%   BMI 27.07 kg/m  GENERAL: Patient  is a well appearing female in no acute distress HEENT:  Sclerae anicteric. Mask in place. Neck is supple.  NODES:  No cervical, supraclavicular, or axillary lymphadenopathy palpated.  BREAST EXAM: right breast s/p mastectomy and radiation, no sign of local recurrence, left breast s/p mastectomy and reconstruction, no sign of local recurrence LUNGS:  Clear to auscultation bilaterally.  No wheezes or rhonchi. HEART:  Regular rate and rhythm. No murmur appreciated. ABDOMEN:  Soft, nontender.  Positive, normoactive bowel sounds. No organomegaly palpated. MSK:  No focal spinal tenderness to palpation. Full range of motion bilaterally in the upper extremities. EXTREMITIES:  No peripheral edema.   SKIN:  Clear with no obvious rashes or skin changes. No nail dyscrasia. NEURO:  Nonfocal. Well oriented.  Appropriate affect.    LABORATORY DATA:  None for this visit.  DIAGNOSTIC IMAGING:  None for this visit.      ASSESSMENT AND PLAN:  Ms.. Alvarado is a pleasant 74 y.o. female with Stage IA right breast invasive lobular carcinoma, ER+/PR+/HER2-, diagnosed in 11/2018, treated with lumpectomy (previous h/o bilateral mastectomies), adjuvant radiation therapy, and anti-estrogen therapy with Anastrozole beginning in 04/2020.  She presents to the Survivorship Clinic for our initial meeting and routine follow-up post-completion of treatment for breast cancer.    1. Stage IA right  breast cancer:  Patricia Alvarado is continuing to recover from definitive treatment for breast cancer. She will follow-up with her medical oncologist, Dr. Pamelia Hoit in 6 months with history and physical exam per surveillance protocol.  She has opted not to take Anastrozole and understands this increases the risk of recurrence.  Today, a comprehensive survivorship care plan and treatment summary was reviewed with the patient today detailing her breast cancer diagnosis, treatment course, potential late/long-term effects of treatment,  appropriate follow-up care with recommendations for the future, and patient education resources.  A copy of this summary, along with a letter will be sent to the patient's primary care provider via mail/fax/In Basket message after today's visit.    2. Shortness of breath: will get chest xray today.  She knows to call us if it worsens.    3. Fatigue/foggy headedness:  She has noticed this accompanying the shortness of breath, I am hopeful this will continue to improve.    4. Bone health:  She was given education on specific activities to promote bone health.  5. Cancer screening:  Due to Patricia Alvarado's history and her age, she should receive screening for skin cancers, colon cancer, and gynecologic cancers.  The information and recommendations are listed on the patient's comprehensive care plan/treatment summary and were reviewed in detail with the patient.    6. Health maintenance and wellness promotion: Patricia Alvarado was encouraged to consume 5-7 servings of fruits and vegetables per day. We reviewed the "Nutrition Rainbow" handout, as well as the handout "Take Control of Your Health and Reduce Your Cancer Risk" from the American Cancer Society.  She was also encouraged to engage in moderate to vigorous exercise for 30 minutes per day most days of the week. We discussed the [employer] fitness program, which is designed for cancer survivors to help them become more physically fit after cancer treatments.  She was instructed to limit her alcohol consumption and continue to abstain from tobacco use.     7. Support services/counseling: It is not uncommon for this period of the patient's cancer care trajectory to be one of many emotions and stressors.  We discussed how this can be increasingly difficult during the times of quarantine and social distancing due to the COVID-19 pandemic.   She was given information regarding our available services and encouraged to contact me with any questions or for help  enrolling in any of our support group/programs.    Follow up instructions:    -Return to cancer center in 6 months for f/u with Dr. Pamelia Hoit  -Follow up with surgery in one year -She is welcome to return back to the Survivorship Clinic at any time; no additional follow-up needed at this time.  -Consider referral back to survivorship as a long-term survivor for continued surveillance  The patient was provided an opportunity to ask questions and all were answered. The patient agreed with the plan and demonstrated an understanding of the instructions.   Total encounter time: 45 minutes*  Lillard Anes, NP 07/17/20 2:02 PM Medical Oncology and Hematology [facility] [address] [place], [place] [address] Tel. [phone]    Fax. [phone]  *Total Encounter Time as defined by the Centers for Medicare and Medicaid Services includes, in addition to the face-to-face time of a patient visit (documented in the note above) non-face-to-face time: obtaining and reviewing outside history, ordering and reviewing medications, tests or procedures, care coordination (communications with other health care professionals or caregivers) and documentation in the  medical record.

## 2020-07-17 ENCOUNTER — Encounter: Payer: Self-pay | Admitting: Adult Health

## 2020-07-17 ENCOUNTER — Ambulatory Visit (HOSPITAL_COMMUNITY)
Admission: RE | Admit: 2020-07-17 | Discharge: 2020-07-17 | Disposition: A | Discharge status: Home / Self Care | Payer: PPO | Source: Ambulatory Visit | Attending: Adult Health | Admitting: Adult Health

## 2020-07-17 ENCOUNTER — Inpatient Hospital Stay: Payer: PPO | Attending: Hematology and Oncology | Admitting: Adult Health

## 2020-07-17 ENCOUNTER — Other Ambulatory Visit: Payer: Self-pay

## 2020-07-17 VITALS — BP 118/78 | HR 75 | Temp 97.4°F | Resp 18 | Ht 64.5 in | Wt 160.2 lb

## 2020-07-17 DIAGNOSIS — Z8616 Personal history of COVID-19: Secondary | ICD-10-CM | POA: Insufficient documentation

## 2020-07-17 DIAGNOSIS — R0602 Shortness of breath: Secondary | ICD-10-CM | POA: Diagnosis not present

## 2020-07-17 DIAGNOSIS — R5383 Other fatigue: Secondary | ICD-10-CM | POA: Insufficient documentation

## 2020-07-17 DIAGNOSIS — C50111 Malignant neoplasm of central portion of right female breast: Secondary | ICD-10-CM | POA: Insufficient documentation

## 2020-07-17 DIAGNOSIS — Z170 Estrogen receptor positive status [ER+]: Secondary | ICD-10-CM | POA: Insufficient documentation

## 2020-07-18 ENCOUNTER — Telehealth: Payer: Self-pay | Admitting: Adult Health

## 2020-07-18 NOTE — Telephone Encounter (Signed)
Scheduled appts per 9/22 los. Pt confirmed appt date and time.

## 2020-07-19 ENCOUNTER — Encounter: Payer: Self-pay | Admitting: Adult Health

## 2020-07-25 ENCOUNTER — Telehealth: Payer: Self-pay

## 2020-07-25 ENCOUNTER — Other Ambulatory Visit (HOSPITAL_COMMUNITY): Payer: Self-pay | Admitting: Adult Health

## 2020-07-25 DIAGNOSIS — R0609 Other forms of dyspnea: Secondary | ICD-10-CM

## 2020-07-25 DIAGNOSIS — C50111 Malignant neoplasm of central portion of right female breast: Secondary | ICD-10-CM

## 2020-07-25 NOTE — Telephone Encounter (Signed)
Pt is scheduled for CT chest 08/06/20 at 1430. Message sent to scheduling to arrange labs same day at 1330. Pt understands she should have liquids only 4 hours prior to CT. Pt is in agreement with these appts.

## 2020-07-25 NOTE — Telephone Encounter (Signed)
Lets go ahead and get CT scan, placing order, she may need pulmonology referral after

## 2020-07-25 NOTE — Progress Notes (Signed)
Patient with progressive DOE, chest xray inderminate ? Scarring in lung.  CT chest ordered.    Wilber Bihari, NP

## 2020-07-25 NOTE — Telephone Encounter (Signed)
Spoke with pt and gave results per Wilber Bihari, NP. Pt states she is "feeling better, but I still have ShOB on exertion, like when I am doing house work and things like that." Pt denies chest pains/cough. Pt states she would rather not "ignore this, or let this go" but to "keep getting scans until we get to the bottom of what is going on." This nurse explained to pt we may proceed with CT scan. Pt states she is going to the zoo today and hopes she can tolerate walking around. Informed pt I would call her with more information; verbalized thanks and understanding.  Mendel Ryder, please advise about CT.

## 2020-07-25 NOTE — Telephone Encounter (Signed)
-----   Message from Gardenia Phlegm, NP sent at 07/25/2020  7:04 AM EDT ----- Please call patietn and see how her shortness of breath is.  Chest xray doesn't show anything majorly concerning, but may want to get CT chest to further evaluate.   ----- Message ----- From: Interface, Rad Results In Sent: 07/18/2020  10:26 AM EDT To: Gardenia Phlegm, NP

## 2020-08-06 ENCOUNTER — Ambulatory Visit (HOSPITAL_COMMUNITY)
Admission: RE | Admit: 2020-08-06 | Discharge: 2020-08-06 | Disposition: A | Discharge status: Home / Self Care | Payer: PPO | Source: Ambulatory Visit | Attending: Adult Health | Admitting: Adult Health

## 2020-08-06 ENCOUNTER — Inpatient Hospital Stay: Payer: PPO | Attending: Hematology and Oncology

## 2020-08-06 ENCOUNTER — Other Ambulatory Visit: Payer: Self-pay

## 2020-08-06 DIAGNOSIS — J90 Pleural effusion, not elsewhere classified: Secondary | ICD-10-CM | POA: Diagnosis not present

## 2020-08-06 DIAGNOSIS — D1809 Hemangioma of other sites: Secondary | ICD-10-CM | POA: Diagnosis not present

## 2020-08-06 DIAGNOSIS — R0609 Other forms of dyspnea: Secondary | ICD-10-CM | POA: Diagnosis not present

## 2020-08-06 DIAGNOSIS — I700 Atherosclerosis of aorta: Secondary | ICD-10-CM | POA: Diagnosis not present

## 2020-08-06 DIAGNOSIS — R0600 Dyspnea, unspecified: Secondary | ICD-10-CM | POA: Diagnosis not present

## 2020-08-06 DIAGNOSIS — C50111 Malignant neoplasm of central portion of right female breast: Secondary | ICD-10-CM

## 2020-08-06 LAB — CMP (CANCER CENTER ONLY)
ALT: 16 U/L (ref 0–44)
AST: 18 U/L (ref 15–41)
Albumin: 3.6 g/dL (ref 3.5–5.0)
Alkaline Phosphatase: 82 U/L (ref 38–126)
Anion gap: 4 — ABNORMAL LOW (ref 5–15)
BUN: 13 mg/dL (ref 8–23)
CO2: 31 mmol/L (ref 22–32)
Calcium: 9.8 mg/dL (ref 8.9–10.3)
Chloride: 104 mmol/L (ref 98–111)
Creatinine: 0.77 mg/dL (ref 0.44–1.00)
GFR, Estimated: >60 mL/min (ref >60)
Glucose, Bld: 98 mg/dL (ref 70–99)
Potassium: 4.4 mmol/L (ref 3.5–5.1)
Sodium: 139 mmol/L (ref 135–145)
Total Bilirubin: 0.3 mg/dL (ref 0.3–1.2)
Total Protein: 6.9 g/dL (ref 6.5–8.1)

## 2020-08-06 LAB — CBC WITH DIFFERENTIAL (CANCER CENTER ONLY)
Abs Immature Granulocytes: 0.01 10*3/uL (ref 0.00–0.07)
Basophils Absolute: 0 10*3/uL (ref 0.0–0.1)
Basophils Relative: 1 %
Eosinophils Absolute: 0.2 10*3/uL (ref 0.0–0.5)
Eosinophils Relative: 5 %
HCT: 38.6 % (ref 36.0–46.0)
Hemoglobin: 12.9 g/dL (ref 12.0–15.0)
Immature Granulocytes: 0 %
Lymphocytes Relative: 19 %
Lymphs Abs: 0.8 10*3/uL (ref 0.7–4.0)
MCH: 31.5 pg (ref 26.0–34.0)
MCHC: 33.4 g/dL (ref 30.0–36.0)
MCV: 94.1 fL (ref 80.0–100.0)
Monocytes Absolute: 0.4 10*3/uL (ref 0.1–1.0)
Monocytes Relative: 9 %
Neutro Abs: 2.9 10*3/uL (ref 1.7–7.7)
Neutrophils Relative %: 66 %
Platelet Count: 188 10*3/uL (ref 150–400)
RBC: 4.1 MIL/uL (ref 3.87–5.11)
RDW: 13.6 % (ref 11.5–15.5)
WBC Count: 4.3 10*3/uL (ref 4.0–10.5)
nRBC: 0 % (ref 0.0–0.2)

## 2020-08-06 MED ORDER — IOHEXOL 300 MG/ML  SOLN
75.0000 mL | Freq: Once | INTRAMUSCULAR | Status: AC | PRN
  Administered 2020-08-06: 75 mL via INTRAVENOUS

## 2020-08-06 MED ORDER — DILTIAZEM HCL ER COATED BEADS 120 MG PO CP24
120.0000 mg | ORAL_CAPSULE | Freq: Every day | ORAL | 3 refills | Status: DC
Start: 2020-08-06 — End: 2021-01-28

## 2020-08-06 MED ORDER — RANOLAZINE ER 500 MG PO TB12: 500.0000 mg | ORAL_TABLET | Freq: Two times a day (BID) | ORAL | 3 refills | Status: DC

## 2020-08-06 MED ORDER — APIXABAN 5 MG PO TABS: 5.0000 mg | ORAL_TABLET | Freq: Two times a day (BID) | ORAL | 3 refills | Status: DC

## 2020-08-06 NOTE — Addendum Note (Signed)
Addended by: Willeen Cass A on: 08/06/2020 10:40 AM   Modules accepted: Orders

## 2020-08-07 ENCOUNTER — Telehealth: Payer: Self-pay

## 2020-08-07 NOTE — Telephone Encounter (Signed)
Pt called asking for CT results. Message sent to Wilber Bihari, NP to advise. Pt understands she will receive call tomorrow from [facility] or myself with results.

## 2020-08-08 ENCOUNTER — Other Ambulatory Visit: Payer: Self-pay

## 2020-08-08 ENCOUNTER — Encounter: Payer: Self-pay | Admitting: Adult Health

## 2020-08-08 DIAGNOSIS — I4729 Other ventricular tachycardia: Secondary | ICD-10-CM

## 2020-08-08 DIAGNOSIS — I472 Ventricular tachycardia: Secondary | ICD-10-CM

## 2020-08-08 MED ORDER — FLECAINIDE ACETATE 100 MG PO TABS: ORAL_TABLET | ORAL | 3 refills | Status: DC

## 2020-08-12 ENCOUNTER — Other Ambulatory Visit: Payer: Self-pay

## 2020-08-12 DIAGNOSIS — C50111 Malignant neoplasm of central portion of right female breast: Secondary | ICD-10-CM

## 2020-08-12 DIAGNOSIS — Z170 Estrogen receptor positive status [ER+]: Secondary | ICD-10-CM

## 2020-08-15 ENCOUNTER — Encounter: Payer: Self-pay | Admitting: Pulmonary Disease

## 2020-08-15 ENCOUNTER — Other Ambulatory Visit: Payer: Self-pay

## 2020-08-15 ENCOUNTER — Ambulatory Visit (INDEPENDENT_AMBULATORY_CARE_PROVIDER_SITE_OTHER): Payer: PPO | Admitting: Pulmonary Disease

## 2020-08-15 VITALS — BP 134/70 | HR 75 | Temp 97.2°F | Ht 64.5 in | Wt 161.4 lb

## 2020-08-15 DIAGNOSIS — R9389 Abnormal findings on diagnostic imaging of other specified body structures: Secondary | ICD-10-CM | POA: Diagnosis not present

## 2020-08-15 DIAGNOSIS — R0602 Shortness of breath: Secondary | ICD-10-CM | POA: Diagnosis not present

## 2020-08-15 NOTE — Progress Notes (Signed)
Patricia Alvarado    [id number]    Feb 08, 1946  Primary Care Physician:Schultz, Lora Havens, MD  Referring Physician: Gardenia Phlegm, NP [place],  [place] [id number]  Chief complaint:   Shortness of breath Abnormal CT scan  HPI:  Recent diagnosis of breast cancer for which she had radiation surgery  Right breast lumpectomy 02/05/2020 Radiation treatment between 03/13/2020 to 04/23/2020-Notes reviewed  Admits to shortness of breath with higher grade moderate to severe exertion Able to function well  Appointment was made basically to follow-up on CT scan findings which showed inflammatory changes in the right upper lobe and small pleural effusion versus pleural thickening on the right, calcified left diaphragm process  Recent echocardiogram was within normal limit  She did have Covid 08/15/2019 -Had a PET scan post when she had Covid that did not reveal any inflammatory process  Retired Marine scientist Non-smoker   Outpatient Encounter Medications as of 08/15/2020  Medication Sig  . acetaminophen (TYLENOL) 500 MG tablet Take 500-1,000 mg by mouth every 6 (six) hours as needed for mild pain or moderate pain.  Marland Kitchen ALPRAZolam (XANAX) 0.5 MG tablet Take 0.5 mg by mouth at bedtime.  Marland Kitchen apixaban (ELIQUIS) 5 MG TABS tablet Take 1 tablet (5 mg total) by mouth 2 (two) times daily.  . Ascorbic Acid (VITAMIN C) 1000 MG tablet Take 1 tablet by mouth daily.  Marland Kitchen aspirin 81 MG EC tablet Take 81 mg by mouth daily.    . B Complex-C (SUPER B COMPLEX PO) Take 1 capsule by mouth at bedtime.  . Biotin 5 MG CAPS Take 5 mg by mouth at bedtime.   . bisacodyl (DULCOLAX) 5 MG EC tablet Take 5 mg by mouth daily.  . Calcium 600-200 MG-UNIT tablet Take 1 tablet by mouth daily.  . cetirizine (ZYRTEC) 10 MG tablet Take 10 mg by mouth daily as needed for allergies.   . Coenzyme Q10 (COQ10) 100 MG CAPS Take 100 mg by mouth daily.   Marland Kitchen diltiazem (CARDIZEM CD) 120 MG 24 hr capsule Take 1  capsule (120 mg total) by mouth daily.  Marland Kitchen escitalopram (LEXAPRO) 20 MG tablet Take 20 mg by mouth daily.    . flecainide (TAMBOCOR) 100 MG tablet Take 1 tablet by mouth twice a day and may take an additional tablet daily for increased palpitations  . fluticasone (FLONASE) 50 MCG/ACT nasal spray as needed.  . furosemide (LASIX) 20 MG tablet Take 1 tablet (20 mg total) by mouth daily.  Marland Kitchen MAGNESIUM MALATE PO Take 1 tablet by mouth daily.  Marland Kitchen MANGANESE PO Take 40 mg by mouth at bedtime.  . Melatonin 10 MG TABS Take 10 mg by mouth at bedtime.   . ranolazine (RANEXA) 500 MG 12 hr tablet Take 1 tablet (500 mg total) by mouth 2 (two) times daily.  . Thiamine Mononitrate (VITAMIN B1 PO) Take 1 tablet by mouth at bedtime. 100 mg  . thyroid (ARMOUR) 120 MG tablet Take 120 mg by mouth daily before breakfast.  . VITAMIN D, CHOLECALCIFEROL, PO Take 5,000 Units by mouth daily.  . Zinc 50 MG TABS Take 1 tablet by mouth daily.  . [DISCONTINUED] diltiazem (DILT-XR) 120 MG 24 hr capsule Take 1 capsule (120 mg total) by mouth daily.   No facility-administered encounter medications on file as of 08/15/2020.    Allergies as of 08/15/2020 - Review Complete 08/15/2020  Allergen Reaction Noted  . Quinidine Other (See Comments)   .  Dofetilide Other (See Comments) 05/29/2016  . Vicodin  [hydrocodone-acetaminophen]  03/05/2020  . Amoxicillin Itching and Rash   . Atenolol Hives   . Atorvastatin Other (See Comments)   . Latex Other (See Comments) 05/26/2016  . Nadolol Hives     Past Medical History:  Diagnosis Date  . Basal cell carcinoma 2001   "forehead, between eyebrows"  . Carcinoma of thyroid gland ([facility]) 2001  . Chronic lower back pain    "worse is across my hips" (05/26/2016)  . Depression   . Dyslipidemia   . Family history of breast cancer   . Family history of kidney cancer   . Family history of leukemia   . Family history of nonmelanoma skin cancer   . Fibrocystic breast   . Fibromyalgia   .  Heart murmur   . History of blood transfusion 1992   "after subcutaneous mastectomies"  . Hypothyroidism   . Malignant melanoma of left ankle ([facility]) 2001  . Migraine    "visual; 2-3 times/year" (05/26/2016)  . Mitral valve prolapse   . PONV (postoperative nausea and vomiting)   . Ventricular tachycardia ([facility])    Hx of, controlled on sotalol therapy    Past Surgical History:  Procedure Laterality Date  . ABDOMINAL HYSTERECTOMY  1998  . ANTERIOR CERVICAL DECOMP/DISCECTOMY FUSION  2001  . BACK SURGERY    . BASAL CELL CARCINOMA EXCISION  2001   "cut it out & did a flap, on forehead between my eyebrows"  . BREAST IMPLANT EXCHANGE Bilateral 2001   [id number]  . BREAST IMPLANT REMOVAL Right 02/05/2020   Procedure: REMOVAL RIGHT BREAST IMPLANT AND CAPSULECTOMY;  Surgeon: Jovita Kussmaul, MD;  Location: [facility];  Service: General;  Laterality: Right;  . BREAST LUMPECTOMY Right 02/05/2020   Procedure: RIGHT BREAST CENTRAL LUMPECTOMY;  Surgeon: Jovita Kussmaul, MD;  Location: [facility];  Service: General;  Laterality: Right;  . CARPAL TUNNEL RELEASE Right 2006  . COLONOSCOPY    . DILATION AND CURETTAGE OF UTERUS  1970s X 2-3  . ELECTROPHYSIOLOGIC STUDY  1994 X 2;2001   "to see it it was sustained VT; cause thyroid levels were causing arrhythmias"  . LAPAROSCOPIC CHOLECYSTECTOMY    . MASTECTOMY Bilateral 1992   "subcutaneous"  . MELANOMA EXCISION Left 2001   "ankle, stage I"  . PLACEMENT OF BREAST IMPLANTS Bilateral 1992   [id number]  . TOTAL THYROIDECTOMY  12/1999   "cancer"  . VENTRICULAR ABLATION SURGERY  2011   ventricular tchycardia    Family History  Problem Relation Age of Onset  . Other Brother        AGENT ORANGE and AntiLupus  . Heart disease Brother        Stents and bypass x2  . Arrhythmia Brother        AFIB  . Heart attack Brother   . Hypertension Brother   . Squamous cell carcinoma Brother 20       Skin  . Hyperlipidemia Mother 52   . Osteoporosis Mother   . Basal cell carcinoma Mother 48  . Heart disease Father 4  . Other Father        Cardiac arrest  . Breast cancer Maternal Grandmother 85       metastatic  . Breast cancer Other        dx. unknown age; maternal great-aunt  . Lung cancer Maternal Aunt 75       hx. of smoking  . Other Paternal  Aunt        bilateral mastectomies due to multiple breast lumps  . Leukemia Maternal Aunt   . Cancer Maternal Aunt        unknown types  . Thyroid cancer Neg Hx     Social History   Socioeconomic History  . Marital status: Married    Spouse name: Not on file  . Number of children: Not on file  . Years of education: Not on file  . Highest education level: Not on file  Occupational History  . Occupation: Financial controller GI    Employer: [employer]  Tobacco Use  . Smoking status: Never Smoker  . Smokeless tobacco: Never Used  Vaping Use  . Vaping Use: Never used  Substance and Sexual Activity  . Alcohol use: No  . Drug use: No  . Sexual activity: Yes  Other Topics Concern  . Not on file  Social History Narrative   Married   Social Determinants of Health   Financial Resource Strain:   . Difficulty of Paying Living Expenses: Not on file  Food Insecurity:   . Worried About Charity fundraiser in the Last Year: Not on file  . Ran Out of Food in the Last Year: Not on file  Transportation Needs:   . Lack of Transportation (Medical): Not on file  . Lack of Transportation (Non-Medical): Not on file  Physical Activity:   . Days of Exercise per Week: Not on file  . Minutes of Exercise per Session: Not on file  Stress:   . Feeling of Stress : Not on file  Social Connections:   . Frequency of Communication with Friends and Family: Not on file  . Frequency of Social Gatherings with Friends and Family: Not on file  . Attends Religious Services: Not on file  . Active Member of Clubs or Organizations: Not on file  . Attends Archivist Meetings: Not on file   . Marital Status: Not on file  Intimate Partner Violence:   . Fear of Current or Ex-Partner: Not on file  . Emotionally Abused: Not on file  . Physically Abused: Not on file  . Sexually Abused: Not on file    Review of Systems  Respiratory: Positive for shortness of breath.     Vitals:   08/15/20 1124  BP: 134/70  Pulse: 75  Temp: (!) 97.2 F (36.2 C)  SpO2: 98%     Physical Exam Constitutional:      Appearance: Normal appearance.  HENT:     Nose: No congestion or rhinorrhea.     Mouth/Throat:     Pharynx: No oropharyngeal exudate or posterior oropharyngeal erythema.  Eyes:     General:        Right eye: No discharge.        Left eye: No discharge.  Cardiovascular:     Rate and Rhythm: Normal rate and regular rhythm.     Heart sounds: No murmur heard.  No friction rub.  Pulmonary:     Effort: No respiratory distress.     Breath sounds: No stridor. No wheezing or rhonchi.  Musculoskeletal:     Cervical back: No rigidity or tenderness.  Neurological:     Mental Status: She is alert.  Psychiatric:        Mood and Affect: Mood normal.    Data Reviewed: CT scan was reviewed with the patient Recent PET scan reviewed with the patient  Assessment:  Post radiation inflammatory changes  Shortness  of breath  I do not feel her shortness of breath is significant enough to warrant use of steroids at present  Plan/Recommendations:  Clinical monitoring is appropriate at present  Radiological follow-up can be done when she has her usual surveillance monitoring  If there is any worsening of symptoms PFT or repeat CT may be considered at the time with consideration for the use of steroids  Tentatively will follow up in 6 months  Call with any significant concerns    Sherrilyn Rist MD [facility] 08/15/2020, 11:55 AM  CC: Wilber Bihari Cornett*

## 2020-08-15 NOTE — Patient Instructions (Signed)
Radiation-induced inflammatory process  I do not believe there is any indication for steroids at present If you get more symptomatic then steroids can be considered  It will usually settle There is no way to predict when this will settled but if your symptoms are improving, I will say not a whole lot to be concerned about.   Follow-up in 6 months Can be canceled if you are feeling well with no significant concerns at the time

## 2020-10-17 DIAGNOSIS — Z79899 Other long term (current) drug therapy: Secondary | ICD-10-CM | POA: Diagnosis not present

## 2020-10-17 DIAGNOSIS — R7309 Other abnormal glucose: Secondary | ICD-10-CM | POA: Diagnosis not present

## 2020-10-17 DIAGNOSIS — F330 Major depressive disorder, recurrent, mild: Secondary | ICD-10-CM | POA: Diagnosis not present

## 2020-10-17 DIAGNOSIS — E785 Hyperlipidemia, unspecified: Secondary | ICD-10-CM | POA: Diagnosis not present

## 2020-10-17 DIAGNOSIS — C50911 Malignant neoplasm of unspecified site of right female breast: Secondary | ICD-10-CM | POA: Diagnosis not present

## 2020-10-17 DIAGNOSIS — R5381 Other malaise: Secondary | ICD-10-CM | POA: Diagnosis not present

## 2020-10-17 DIAGNOSIS — Z6827 Body mass index (BMI) 27.0-27.9, adult: Secondary | ICD-10-CM | POA: Diagnosis not present

## 2020-10-17 DIAGNOSIS — E039 Hypothyroidism, unspecified: Secondary | ICD-10-CM | POA: Diagnosis not present

## 2020-10-17 DIAGNOSIS — I10 Essential (primary) hypertension: Secondary | ICD-10-CM | POA: Diagnosis not present

## 2020-10-17 DIAGNOSIS — G4700 Insomnia, unspecified: Secondary | ICD-10-CM | POA: Diagnosis not present

## 2020-10-17 DIAGNOSIS — I480 Paroxysmal atrial fibrillation: Secondary | ICD-10-CM | POA: Diagnosis not present

## 2020-10-17 DIAGNOSIS — M797 Fibromyalgia: Secondary | ICD-10-CM | POA: Diagnosis not present

## 2020-11-05 ENCOUNTER — Ambulatory Visit: Payer: PPO | Admitting: Plastic Surgery

## 2020-11-05 ENCOUNTER — Other Ambulatory Visit: Payer: Self-pay

## 2020-11-05 ENCOUNTER — Encounter: Payer: Self-pay | Admitting: Plastic Surgery

## 2020-11-05 DIAGNOSIS — Z9011 Acquired absence of right breast and nipple: Secondary | ICD-10-CM | POA: Diagnosis not present

## 2020-11-05 NOTE — Progress Notes (Signed)
   Subjective:    Patient ID: Patricia Alvarado, female    DOB: 1946-01-13, 75 y.o.   MRN: [id number]  The patient is a 75 year old female here for a consultation for her right breast.  The patient had bilateral mastectomies in 1992 for right-sided breast cancer.  She was reconstructed with implants.  A year ago she was found to have a recurrence on the right breast and underwent further excision including the nipple areola but Dr. Marlou Starks.  She then was radiated which ended in June 2021.  The left side has the implant in place which is a 350 cc saline implant and she still has her nipple areola.  She is 5 feet 5 inches tall and weighs 159 pounds.  She is interested in right breast reconstruction.  The skin shows the sinus of the radiation.  It is a little bit tight.  The patient would like to have implant reconstruction and is willing to go smaller on the left side.  She would like to avoid an implant under her muscle on the right if possible.  We discussed that this would not be the greatest idea to start off with.  Because of her previous surgery under the muscle might not be possible.  She understands that a small implant just to resolve the concave appearance may be all that she can achieve.     Review of Systems  Constitutional: Negative.   HENT: Negative.   Eyes: Negative.   Respiratory: Negative.  Negative for chest tightness.   Cardiovascular: Negative.   Gastrointestinal: Negative.   Endocrine: Negative.   Genitourinary: Negative.   Musculoskeletal: Negative.   Hematological: Negative.   Psychiatric/Behavioral: Negative.        Objective:   Physical Exam Vitals and nursing note reviewed.  Constitutional:      Appearance: Normal appearance.  HENT:     Head: Normocephalic and atraumatic.  Cardiovascular:     Rate and Rhythm: Normal rate.     Pulses: Normal pulses.  Pulmonary:     Effort: Pulmonary effort is normal.  Abdominal:     General: Abdomen is flat. There is no  distension.  Neurological:     General: No focal deficit present.     Mental Status: She is alert and oriented to person, place, and time.  Psychiatric:        Mood and Affect: Mood normal.        Behavior: Behavior normal.         Assessment & Plan:     ICD-10-CM   1. Acquired absence of right breast  Z90.11   2. S/P mastectomy, right  Z90.11     Discussed option for reconstruction and she understands that due to the radiation it is limited.  She does not want to use autologous reconstruction and I agree that that would not be the best idea for her.  She also understands that if it fails she will have no reconstruction.  She is willing to take that chance.   We can plan a right breast reconstruction with expander and Flex HD placement.  The second stage would be removal of the expander and placement of an implant. Pictures were obtained of the patient and placed in the chart with the patient's or guardian's permission.  Wound at the second stage of the left implant can be removed and made smaller.

## 2020-12-19 DIAGNOSIS — D225 Melanocytic nevi of trunk: Secondary | ICD-10-CM | POA: Diagnosis not present

## 2020-12-19 DIAGNOSIS — D485 Neoplasm of uncertain behavior of skin: Secondary | ICD-10-CM | POA: Diagnosis not present

## 2020-12-19 DIAGNOSIS — Z85820 Personal history of malignant melanoma of skin: Secondary | ICD-10-CM | POA: Diagnosis not present

## 2020-12-19 DIAGNOSIS — D1801 Hemangioma of skin and subcutaneous tissue: Secondary | ICD-10-CM | POA: Diagnosis not present

## 2020-12-19 DIAGNOSIS — D2239 Melanocytic nevi of other parts of face: Secondary | ICD-10-CM | POA: Diagnosis not present

## 2020-12-24 DIAGNOSIS — H25811 Combined forms of age-related cataract, right eye: Secondary | ICD-10-CM | POA: Diagnosis not present

## 2020-12-24 DIAGNOSIS — Z01818 Encounter for other preprocedural examination: Secondary | ICD-10-CM | POA: Diagnosis not present

## 2020-12-24 HISTORY — PX: EYE SURGERY: SHX253

## 2020-12-25 ENCOUNTER — Telehealth: Payer: Self-pay | Admitting: Hematology and Oncology

## 2020-12-25 ENCOUNTER — Encounter: Payer: Self-pay | Admitting: Hematology and Oncology

## 2020-12-25 NOTE — Telephone Encounter (Signed)
Rescheduled 03/23 appointment per patient request. Per 03/01 schedule message. Contacted patient about changes, patient is aware.

## 2020-12-26 DIAGNOSIS — C44619 Basal cell carcinoma of skin of left upper limb, including shoulder: Secondary | ICD-10-CM | POA: Diagnosis not present

## 2020-12-26 NOTE — H&P (View-Only) (Signed)
Patient ID: Patricia Alvarado, female    DOB: 10-31-1945, 75 y.o.   MRN: [id number]  Chief Complaint  Patient presents with  . Pre-op Exam      ICD-10-CM   1. Acquired absence of right breast  Z90.11   2. S/P mastectomy, right  Z90.11   3. Malignant neoplasm of central portion of right breast in female, estrogen receptor positive ([place])  C50.111    Z17.0   4. Paroxysmal atrial fibrillation (HCC)  I48.0      History of Present Illness: Patricia Alvarado is a 75 y.o.  female  with a history of bilateral mastectomies in 1992 for right-sided breast cancer.  She subsequently underwent reconstruction with implants, however 1 year ago she was found to have recurrence in the right breast and underwent further excision and radiation.  She presents for preoperative evaluation for upcoming procedure, right breast reconstruction with placement of tissue expander and Flex HD, scheduled for 01/20/2021 with Dr. Marla Roe.  The patient has not had problems with anesthesia. No history of DVT/PE.  No family history of DVT/PE.  No family or personal history of bleeding or clotting disorders.  No history of CVA/MI.   Patient is currently on Eliquis and aspirin, she reports she has an appointment with her cardiologist on 01/06/2021 to discuss preoperative clearance and holding her medications.  She reports that she is scheduled for cataract surgery on 12/31/2020 and 01/14/2021, reports this will be under sedation and she would not be under anesthetic.  She also reports that she recently had a lesion removed from her left arm and has a wound on her left arm that she has been treating with guidance of her dermatologist.  Summary of Previous Visit: History of bilateral mastectomies in 1992 for right-sided breast cancer.  She underwent reconstruction with implants.  A year ago she was found to have recurrence on the right breast and underwent further excision including the nipple areola.  She was then radiated which  ended in June 2021.  The left side has an implant in place which is a 350 cc saline implant.  She understands that a small implant is to resolve the concave appearance may be all that she can achieve   PMH Significant for: Dyslipidemia, fibromyalgia, mitral valve prolapse, Afib, PONV, V. tach (history)  Patient reports she has been feeling well lately, reports no chest pain or significant shortness of breath.  She does report that she has baseline shortness of breath, reports this has been improving.  She reports it is associated with radiation damage to her lungs and has been told it was pulmonary fibrosis.  She is otherwise doing well  Past Medical History: Allergies: Allergies  Allergen Reactions  . Quinidine Other (See Comments)    REACTION: increased heart rate  . Dofetilide Other (See Comments)    Elevated QTCs  . Vicodin  [Hydrocodone-Acetaminophen]   . Amoxicillin Itching and Rash  . Atenolol Hives  . Atorvastatin Other (See Comments)    REACTION: muscles pain  . Latex Other (See Comments)    Raw, rash and blisters - Tape only  . Nadolol Hives    Current Medications:  Current Outpatient Medications:  .  acetaminophen (TYLENOL) 500 MG tablet, Take 500-1,000 mg by mouth every 6 (six) hours as needed for mild pain or moderate pain., Disp: , Rfl:  .  ALPRAZolam (XANAX) 0.5 MG tablet, Take 0.5 mg by mouth at bedtime., Disp: , Rfl:  .  apixaban (ELIQUIS)  5 MG TABS tablet, Take 1 tablet (5 mg total) by mouth 2 (two) times daily., Disp: 180 tablet, Rfl: 3 .  Ascorbic Acid (VITAMIN C) 1000 MG tablet, Take 1 tablet by mouth daily., Disp: , Rfl:  .  aspirin 81 MG EC tablet, Take 81 mg by mouth daily., Disp: , Rfl:  .  B Complex-C (SUPER B COMPLEX PO), Take 1 capsule by mouth at bedtime., Disp: , Rfl:  .  Biotin 5 MG CAPS, Take 5 mg by mouth at bedtime. , Disp: , Rfl:  .  bisacodyl (DULCOLAX) 5 MG EC tablet, Take 5 mg by mouth daily., Disp: , Rfl:  .  Calcium 600-200 MG-UNIT tablet,  Take 1 tablet by mouth daily., Disp: , Rfl:  .  cetirizine (ZYRTEC) 10 MG tablet, Take 10 mg by mouth daily as needed for allergies. , Disp: , Rfl:  .  Coenzyme Q10 (COQ10) 100 MG CAPS, Take 100 mg by mouth daily., Disp: , Rfl:  .  diltiazem (CARDIZEM CD) 120 MG 24 hr capsule, Take 1 capsule (120 mg total) by mouth daily., Disp: 90 capsule, Rfl: 3 .  escitalopram (LEXAPRO) 20 MG tablet, Take 20 mg by mouth daily., Disp: , Rfl:  .  famotidine (PEPCID) 20 MG tablet, Take 20 mg by mouth as needed for heartburn or indigestion., Disp: , Rfl:  .  flecainide (TAMBOCOR) 100 MG tablet, Take 1 tablet by mouth twice a day and may take an additional tablet daily for increased palpitations, Disp: 225 tablet, Rfl: 3 .  fluticasone (FLONASE) 50 MCG/ACT nasal spray, as needed., Disp: , Rfl:  .  furosemide (LASIX) 20 MG tablet, Take 1 tablet (20 mg total) by mouth daily., Disp: 90 tablet, Rfl: 3 .  MAGNESIUM MALATE PO, Take 1 tablet by mouth daily., Disp: , Rfl:  .  MANGANESE PO, Take 40 mg by mouth at bedtime., Disp: , Rfl:  .  Melatonin 10 MG TABS, Take 10 mg by mouth at bedtime. , Disp: , Rfl:  .  niacin (NIASPAN) 1000 MG CR tablet, Take 1,000 mg by mouth at bedtime., Disp: , Rfl:  .  OVER THE COUNTER MEDICATION, Iron-daily, Disp: , Rfl:  .  OVER THE COUNTER MEDICATION, Stool Softener 2 daily, Disp: , Rfl:  .  OVER THE COUNTER MEDICATION, Milk of Mg-15cc as needed for constipation., Disp: , Rfl:  .  OVER THE COUNTER MEDICATION, Gas Ex-Take 2 tablets as needed., Disp: , Rfl:  .  ranolazine (RANEXA) 500 MG 12 hr tablet, Take 1 tablet (500 mg total) by mouth 2 (two) times daily., Disp: 180 tablet, Rfl: 3 .  Thiamine Mononitrate (VITAMIN B1 PO), Take 1 tablet by mouth at bedtime. 100 mg, Disp: , Rfl:  .  thyroid (ARMOUR) 120 MG tablet, Take 120 mg by mouth daily before breakfast., Disp: , Rfl:  .  VITAMIN D, CHOLECALCIFEROL, PO, Take 5,000 Units by mouth daily., Disp: , Rfl:  .  Zinc 50 MG TABS, Take 1 tablet by  mouth daily., Disp: , Rfl:   Past Medical Problems: Past Medical History:  Diagnosis Date  . Basal cell carcinoma 2001   "forehead, between eyebrows"  . Carcinoma of thyroid gland ([place]) 2001  . Chronic lower back pain    "worse is across my hips" (05/26/2016)  . Depression   . Dyslipidemia   . Family history of breast cancer   . Family history of kidney cancer   . Family history of leukemia   . Family history of  nonmelanoma skin cancer   . Fibrocystic breast   . Fibromyalgia   . Heart murmur   . History of blood transfusion 1992   "after subcutaneous mastectomies"  . Hypothyroidism   . Malignant melanoma of left ankle ([place]) 2001  . Migraine    "visual; 2-3 times/year" (05/26/2016)  . Mitral valve prolapse   . PONV (postoperative nausea and vomiting)   . Ventricular tachycardia (HCC)    Hx of, controlled on sotalol therapy    Past Surgical History: Past Surgical History:  Procedure Laterality Date  . ABDOMINAL HYSTERECTOMY  1998  . ANTERIOR CERVICAL DECOMP/DISCECTOMY FUSION  2001  . BACK SURGERY    . BASAL CELL CARCINOMA EXCISION  2001   "cut it out & did a flap, on forehead between my eyebrows"  . BREAST IMPLANT EXCHANGE Bilateral 2001   [id number]  . BREAST IMPLANT REMOVAL Right 02/05/2020   Procedure: REMOVAL RIGHT BREAST IMPLANT AND CAPSULECTOMY;  Surgeon: Jovita Kussmaul, MD;  Location: [facility];  Service: General;  Laterality: Right;  . BREAST LUMPECTOMY Right 02/05/2020   Procedure: RIGHT BREAST CENTRAL LUMPECTOMY;  Surgeon: Jovita Kussmaul, MD;  Location: [facility];  Service: General;  Laterality: Right;  . CARPAL TUNNEL RELEASE Right 2006  . COLONOSCOPY    . DILATION AND CURETTAGE OF UTERUS  1970s X 2-3  . ELECTROPHYSIOLOGIC STUDY  1994 X 2;2001   "to see it it was sustained VT; cause thyroid levels were causing arrhythmias"  . LAPAROSCOPIC CHOLECYSTECTOMY    . MASTECTOMY Bilateral 1992   "subcutaneous"  . MELANOMA EXCISION Left  2001   "ankle, stage I"  . PLACEMENT OF BREAST IMPLANTS Bilateral 1992   [id number]  . TOTAL THYROIDECTOMY  12/1999   "cancer"  . VENTRICULAR ABLATION SURGERY  2011   ventricular tchycardia    Social History: Social History   Socioeconomic History  . Marital status: Married    Spouse name: Not on file  . Number of children: Not on file  . Years of education: Not on file  . Highest education level: Not on file  Occupational History  . Occupation: Financial controller GI    Employer: [employer]  Tobacco Use  . Smoking status: Never Smoker  . Smokeless tobacco: Never Used  Vaping Use  . Vaping Use: Never used  Substance and Sexual Activity  . Alcohol use: No  . Drug use: No  . Sexual activity: Yes  Other Topics Concern  . Not on file  Social History Narrative   Married   Social Determinants of Health   Financial Resource Strain: Not on file  Food Insecurity: Not on file  Transportation Needs: Not on file  Physical Activity: Not on file  Stress: Not on file  Social Connections: Not on file  Intimate Partner Violence: Not on file    Family History: Family History  Problem Relation Age of Onset  . Other Brother        AGENT ORANGE and AntiLupus  . Heart disease Brother        Stents and bypass x2  . Arrhythmia Brother        AFIB  . Heart attack Brother   . Hypertension Brother   . Squamous cell carcinoma Brother 20       Skin  . Hyperlipidemia Mother 22  . Osteoporosis Mother   . Basal cell carcinoma Mother 45  . Heart disease Father 84  . Other Father  Cardiac arrest  . Breast cancer Maternal Grandmother 85       metastatic  . Breast cancer Other        dx. unknown age; maternal great-aunt  . Lung cancer Maternal Aunt 75       hx. of smoking  . Other Paternal Aunt        bilateral mastectomies due to multiple breast lumps  . Leukemia Maternal Aunt   . Cancer Maternal Aunt        unknown types  . Thyroid cancer Neg Hx     Review of Systems: Review  of Systems  Constitutional: Negative.   Respiratory: Positive for shortness of breath (Chronic, improving). Negative for wheezing.   Cardiovascular: Negative.   Gastrointestinal: Negative.   Genitourinary: Negative.   Neurological: Negative.     Physical Exam: Vital Signs BP (!) 152/65 (BP Location: Left Arm, Patient Position: Sitting, Cuff Size: Normal)   Pulse 75   SpO2 97%   Physical Exam  Constitutional:      General: Not in acute distress.    Appearance: Normal appearance. Not ill-appearing.  HENT:     Head: Normocephalic and atraumatic.  Eyes:     Pupils: Pupils are equal, round Neck:     Musculoskeletal: Normal range of motion.  Cardiovascular:     Rate and Rhythm: Normal rate and regular rhythm.     Pulses: Normal pulses.     Heart sounds: Normal heart sounds. No murmur.  Pulmonary:     Effort: Pulmonary effort is normal. No respiratory distress.     Breath sounds: Normal breath sounds. No wheezing.  Abdominal:     General: Abdomen is flat. There is no distension.     Palpations: Abdomen is soft.     Tenderness: There is no abdominal tenderness.  Musculoskeletal: Normal range of motion.  Skin:    General: Skin is warm and dry.     Findings: No erythema or rash.  Neurological:     General: No focal deficit present.     Mental Status: Alert and oriented to person, place, and time. Mental status is at baseline.     Motor: No weakness.  Psychiatric:        Mood and Affect: Mood normal.        Behavior: Behavior normal.    Assessment/Plan: The patient is scheduled for right breast reconstruction with placement of tissue expander and Flex HD with Dr. Marla Roe.  Risks, benefits, and alternatives of procedure discussed, questions answered and consent obtained.    Smoking Status: Non-smoker; Counseling Given?  N/A Last Mammogram: Bilateral mastectomies  Caprini Score: 8, high; Risk Factors include: Age, history of cancer, BMI greater than 25, and length of  planned surgery, swollen legs. Recommendation for mechanical and pharmacological prophylaxis. Encourage early ambulation.  Patient will restart her Eliquis postoperatively for VTE prophylaxis  Pictures obtained:[email or web address]   Post-op Rx sent to pharmacy: Tramadol, Keflex, Zofran.  Patient reports she has not tolerated Norco in the past.  She reports she has tolerated Keflex without any issues.  Cardiac clearance sent to patient's cardiologist.  Patient will need to hold Eliquis and aspirin prior to surgery, will await cardiology recommendations.  Patient was provided with the breast reconstruction and General Surgical Risk consent document and Pain Medication Agreement prior to their appointment.  They had adequate time to read through the risk consent documents and Pain Medication Agreement. We also discussed them in person together during this preop appointment. All of their questions were  answered to their satisfaction.  Recommended calling if they have any further questions.  Risk consent form and Pain Medication Agreement to be scanned into patient's chart.  The risks that can be encountered with and after placement of a breast expander placement were discussed and include the following but not limited to these: bleeding, infection, delayed healing, anesthesia risks, skin sensation changes, injury to structures including nerves, blood vessels, and muscles which may be temporary or permanent, allergies to tape, suture materials and glues, blood products, topical preparations or injected agents, skin contour irregularities, skin discoloration and swelling, deep vein thrombosis, cardiac and pulmonary complications, pain, which may persist, fluid accumulation, wrinkling of the skin over the expander, changes in nipple or breast sensation, expander leakage or rupture, faulty position of the expander, persistent pain, formation of tight scar tissue around the expander (capsular contracture), possible need for  revisional surgery or staged procedures.  We discussed patient's personal risk factors including history of radiation to the right breast.   Electronically signed by: Carola Rhine August Longest, PA-C 12/27/2020 10:31 AM

## 2020-12-26 NOTE — Progress Notes (Signed)
Patient ID: Patricia Alvarado, female    DOB: [birth year], 75 y.o.   MRN: [id number]  Chief Complaint  Patient presents with  . Pre-op Exam      ICD-10-CM   1. Acquired absence of right breast  Z90.11   2. S/P mastectomy, right  Z90.11   3. Malignant neoplasm of central portion of right breast in female, estrogen receptor positive ([place])  C50.111    Z17.0   4. Paroxysmal atrial fibrillation (HCC)  I48.0      History of Present Illness: Patricia Alvarado is a 75 y.o.  female  with a history of bilateral mastectomies in 1992 for right-sided breast cancer.  She subsequently underwent reconstruction with implants, however 1 year ago she was found to have recurrence in the right breast and underwent further excision and radiation.  She presents for preoperative evaluation for upcoming procedure, right breast reconstruction with placement of tissue expander and Flex HD, scheduled for 01/20/2021 with Dr. Marla Roe.  The patient has not had problems with anesthesia. No history of DVT/PE.  No family history of DVT/PE.  No family or personal history of bleeding or clotting disorders.  No history of CVA/MI.   Patient is currently on Eliquis and aspirin, she reports she has an appointment with her cardiologist on 01/06/2021 to discuss preoperative clearance and holding her medications.  She reports that she is scheduled for cataract surgery on 12/31/2020 and 01/14/2021, reports this will be under sedation and she would not be under anesthetic.  She also reports that she recently had a lesion removed from her left arm and has a wound on her left arm that she has been treating with guidance of her dermatologist.  Summary of Previous Visit: History of bilateral mastectomies in 1992 for right-sided breast cancer.  She underwent reconstruction with implants.  A year ago she was found to have recurrence on the right breast and underwent further excision including the nipple areola.  She was then radiated which  ended in June 2021.  The left side has an implant in place which is a 350 cc saline implant.  She understands that a small implant is to resolve the concave appearance may be all that she can achieve   PMH Significant for: Dyslipidemia, fibromyalgia, mitral valve prolapse, Afib, PONV, V. tach (history)  Patient reports she has been feeling well lately, reports no chest pain or significant shortness of breath.  She does report that she has baseline shortness of breath, reports this has been improving.  She reports it is associated with radiation damage to her lungs and has been told it was pulmonary fibrosis.  She is otherwise doing well  Past Medical History: Allergies: Allergies  Allergen Reactions  . Quinidine Other (See Comments)    REACTION: increased heart rate  . Dofetilide Other (See Comments)    Elevated QTCs  . Vicodin  [Hydrocodone-Acetaminophen]   . Amoxicillin Itching and Rash  . Atenolol Hives  . Atorvastatin Other (See Comments)    REACTION: muscles pain  . Latex Other (See Comments)    Raw, rash and blisters - Tape only  . Nadolol Hives    Current Medications:  Current Outpatient Medications:  .  acetaminophen (TYLENOL) 500 MG tablet, Take 500-1,000 mg by mouth every 6 (six) hours as needed for mild pain or moderate pain., Disp: , Rfl:  .  ALPRAZolam (XANAX) 0.5 MG tablet, Take 0.5 mg by mouth at bedtime., Disp: , Rfl:  .  apixaban (ELIQUIS)  5 MG TABS tablet, Take 1 tablet (5 mg total) by mouth 2 (two) times daily., Disp: 180 tablet, Rfl: 3 .  Ascorbic Acid (VITAMIN C) 1000 MG tablet, Take 1 tablet by mouth daily., Disp: , Rfl:  .  aspirin 81 MG EC tablet, Take 81 mg by mouth daily., Disp: , Rfl:  .  B Complex-C (SUPER B COMPLEX PO), Take 1 capsule by mouth at bedtime., Disp: , Rfl:  .  Biotin 5 MG CAPS, Take 5 mg by mouth at bedtime. , Disp: , Rfl:  .  bisacodyl (DULCOLAX) 5 MG EC tablet, Take 5 mg by mouth daily., Disp: , Rfl:  .  Calcium 600-200 MG-UNIT tablet,  Take 1 tablet by mouth daily., Disp: , Rfl:  .  cetirizine (ZYRTEC) 10 MG tablet, Take 10 mg by mouth daily as needed for allergies. , Disp: , Rfl:  .  Coenzyme Q10 (COQ10) 100 MG CAPS, Take 100 mg by mouth daily., Disp: , Rfl:  .  diltiazem (CARDIZEM CD) 120 MG 24 hr capsule, Take 1 capsule (120 mg total) by mouth daily., Disp: 90 capsule, Rfl: 3 .  escitalopram (LEXAPRO) 20 MG tablet, Take 20 mg by mouth daily., Disp: , Rfl:  .  famotidine (PEPCID) 20 MG tablet, Take 20 mg by mouth as needed for heartburn or indigestion., Disp: , Rfl:  .  flecainide (TAMBOCOR) 100 MG tablet, Take 1 tablet by mouth twice a day and may take an additional tablet daily for increased palpitations, Disp: 225 tablet, Rfl: 3 .  fluticasone (FLONASE) 50 MCG/ACT nasal spray, as needed., Disp: , Rfl:  .  furosemide (LASIX) 20 MG tablet, Take 1 tablet (20 mg total) by mouth daily., Disp: 90 tablet, Rfl: 3 .  MAGNESIUM MALATE PO, Take 1 tablet by mouth daily., Disp: , Rfl:  .  MANGANESE PO, Take 40 mg by mouth at bedtime., Disp: , Rfl:  .  Melatonin 10 MG TABS, Take 10 mg by mouth at bedtime. , Disp: , Rfl:  .  niacin (NIASPAN) 1000 MG CR tablet, Take 1,000 mg by mouth at bedtime., Disp: , Rfl:  .  OVER THE COUNTER MEDICATION, Iron-daily, Disp: , Rfl:  .  OVER THE COUNTER MEDICATION, Stool Softener 2 daily, Disp: , Rfl:  .  OVER THE COUNTER MEDICATION, Milk of Mg-15cc as needed for constipation., Disp: , Rfl:  .  OVER THE COUNTER MEDICATION, Gas Ex-Take 2 tablets as needed., Disp: , Rfl:  .  ranolazine (RANEXA) 500 MG 12 hr tablet, Take 1 tablet (500 mg total) by mouth 2 (two) times daily., Disp: 180 tablet, Rfl: 3 .  Thiamine Mononitrate (VITAMIN B1 PO), Take 1 tablet by mouth at bedtime. 100 mg, Disp: , Rfl:  .  thyroid (ARMOUR) 120 MG tablet, Take 120 mg by mouth daily before breakfast., Disp: , Rfl:  .  VITAMIN D, CHOLECALCIFEROL, PO, Take 5,000 Units by mouth daily., Disp: , Rfl:  .  Zinc 50 MG TABS, Take 1 tablet by  mouth daily., Disp: , Rfl:   Past Medical Problems: Past Medical History:  Diagnosis Date  . Basal cell carcinoma 2001   "forehead, between eyebrows"  . Carcinoma of thyroid gland ([place]) 2001  . Chronic lower back pain    "worse is across my hips" (05/26/2016)  . Depression   . Dyslipidemia   . Family history of breast cancer   . Family history of kidney cancer   . Family history of leukemia   . Family history of  nonmelanoma skin cancer   . Fibrocystic breast   . Fibromyalgia   . Heart murmur   . History of blood transfusion 1992   "after subcutaneous mastectomies"  . Hypothyroidism   . Malignant melanoma of left ankle ([place]) 2001  . Migraine    "visual; 2-3 times/year" (05/26/2016)  . Mitral valve prolapse   . PONV (postoperative nausea and vomiting)   . Ventricular tachycardia (HCC)    Hx of, controlled on sotalol therapy    Past Surgical History: Past Surgical History:  Procedure Laterality Date  . ABDOMINAL HYSTERECTOMY  1998  . ANTERIOR CERVICAL DECOMP/DISCECTOMY FUSION  2001  . BACK SURGERY    . BASAL CELL CARCINOMA EXCISION  2001   "cut it out & did a flap, on forehead between my eyebrows"  . BREAST IMPLANT EXCHANGE Bilateral 2001   [id number]  . BREAST IMPLANT REMOVAL Right 02/05/2020   Procedure: REMOVAL RIGHT BREAST IMPLANT AND CAPSULECTOMY;  Surgeon: Jovita Kussmaul, MD;  Location: [facility];  Service: General;  Laterality: Right;  . BREAST LUMPECTOMY Right 02/05/2020   Procedure: RIGHT BREAST CENTRAL LUMPECTOMY;  Surgeon: Jovita Kussmaul, MD;  Location: [facility];  Service: General;  Laterality: Right;  . CARPAL TUNNEL RELEASE Right 2006  . COLONOSCOPY    . DILATION AND CURETTAGE OF UTERUS  1970s X 2-3  . ELECTROPHYSIOLOGIC STUDY  1994 X 2;2001   "to see it it was sustained VT; cause thyroid levels were causing arrhythmias"  . LAPAROSCOPIC CHOLECYSTECTOMY    . MASTECTOMY Bilateral 1992   "subcutaneous"  . MELANOMA EXCISION Left  2001   "ankle, stage I"  . PLACEMENT OF BREAST IMPLANTS Bilateral 1992   [id number]  . TOTAL THYROIDECTOMY  12/1999   "cancer"  . VENTRICULAR ABLATION SURGERY  2011   ventricular tchycardia    Social History: Social History   Socioeconomic History  . Marital status: Married    Spouse name: Not on file  . Number of children: Not on file  . Years of education: Not on file  . Highest education level: Not on file  Occupational History  . Occupation: Financial controller GI    Employer: [employer]  Tobacco Use  . Smoking status: Never Smoker  . Smokeless tobacco: Never Used  Vaping Use  . Vaping Use: Never used  Substance and Sexual Activity  . Alcohol use: No  . Drug use: No  . Sexual activity: Yes  Other Topics Concern  . Not on file  Social History Narrative   Married   Social Determinants of Health   Financial Resource Strain: Not on file  Food Insecurity: Not on file  Transportation Needs: Not on file  Physical Activity: Not on file  Stress: Not on file  Social Connections: Not on file  Intimate Partner Violence: Not on file    Family History: Family History  Problem Relation Age of Onset  . Other Brother        AGENT ORANGE and AntiLupus  . Heart disease Brother        Stents and bypass x2  . Arrhythmia Brother        AFIB  . Heart attack Brother   . Hypertension Brother   . Squamous cell carcinoma Brother 20       Skin  . Hyperlipidemia Mother 13  . Osteoporosis Mother   . Basal cell carcinoma Mother 87  . Heart disease Father 24  . Other Father  Cardiac arrest  . Breast cancer Maternal Grandmother 85       metastatic  . Breast cancer Other        dx. unknown age; maternal great-aunt  . Lung cancer Maternal Aunt 75       hx. of smoking  . Other Paternal Aunt        bilateral mastectomies due to multiple breast lumps  . Leukemia Maternal Aunt   . Cancer Maternal Aunt        unknown types  . Thyroid cancer Neg Hx     Review of Systems: Review  of Systems  Constitutional: Negative.   Respiratory: Positive for shortness of breath (Chronic, improving). Negative for wheezing.   Cardiovascular: Negative.   Gastrointestinal: Negative.   Genitourinary: Negative.   Neurological: Negative.     Physical Exam: Vital Signs BP (!) 152/65 (BP Location: Left Arm, Patient Position: Sitting, Cuff Size: Normal)   Pulse 75   SpO2 97%   Physical Exam  Constitutional:      General: Not in acute distress.    Appearance: Normal appearance. Not ill-appearing.  HENT:     Head: Normocephalic and atraumatic.  Eyes:     Pupils: Pupils are equal, round Neck:     Musculoskeletal: Normal range of motion.  Cardiovascular:     Rate and Rhythm: Normal rate and regular rhythm.     Pulses: Normal pulses.     Heart sounds: Normal heart sounds. No murmur.  Pulmonary:     Effort: Pulmonary effort is normal. No respiratory distress.     Breath sounds: Normal breath sounds. No wheezing.  Abdominal:     General: Abdomen is flat. There is no distension.     Palpations: Abdomen is soft.     Tenderness: There is no abdominal tenderness.  Musculoskeletal: Normal range of motion.  Skin:    General: Skin is warm and dry.     Findings: No erythema or rash.  Neurological:     General: No focal deficit present.     Mental Status: Alert and oriented to person, place, and time. Mental status is at baseline.     Motor: No weakness.  Psychiatric:        Mood and Affect: Mood normal.        Behavior: Behavior normal.    Assessment/Plan: The patient is scheduled for right breast reconstruction with placement of tissue expander and Flex HD with Dr. Marla Roe.  Risks, benefits, and alternatives of procedure discussed, questions answered and consent obtained.    Smoking Status: Non-smoker; Counseling Given?  N/A Last Mammogram: Bilateral mastectomies  Caprini Score: 8, high; Risk Factors include: Age, history of cancer, BMI greater than 25, and length of  planned surgery, swollen legs. Recommendation for mechanical and pharmacological prophylaxis. Encourage early ambulation.  Patient will restart her Eliquis postoperatively for VTE prophylaxis  Pictures obtained:@[email or web address]   Post-op Rx sent to pharmacy: Tramadol, Keflex, Zofran.  Patient reports she has not tolerated Norco in the past.  She reports she has tolerated Keflex without any issues.  Cardiac clearance sent to patient's cardiologist.  Patient will need to hold Eliquis and aspirin prior to surgery, will await cardiology recommendations.  Patient was provided with the breast reconstruction and General Surgical Risk consent document and Pain Medication Agreement prior to their appointment.  They had adequate time to read through the risk consent documents and Pain Medication Agreement. We also discussed them in person together during this preop appointment. All of their questions were  answered to their satisfaction.  Recommended calling if they have any further questions.  Risk consent form and Pain Medication Agreement to be scanned into patient's chart.  The risks that can be encountered with and after placement of a breast expander placement were discussed and include the following but not limited to these: bleeding, infection, delayed healing, anesthesia risks, skin sensation changes, injury to structures including nerves, blood vessels, and muscles which may be temporary or permanent, allergies to tape, suture materials and glues, blood products, topical preparations or injected agents, skin contour irregularities, skin discoloration and swelling, deep vein thrombosis, cardiac and pulmonary complications, pain, which may persist, fluid accumulation, wrinkling of the skin over the expander, changes in nipple or breast sensation, expander leakage or rupture, faulty position of the expander, persistent pain, formation of tight scar tissue around the expander (capsular contracture), possible need for  revisional surgery or staged procedures.  We discussed patient's personal risk factors including history of radiation to the right breast.   Electronically signed by: Carola Rhine Scheeler, PA-C 12/27/2020 10:31 AM

## 2020-12-27 ENCOUNTER — Ambulatory Visit (INDEPENDENT_AMBULATORY_CARE_PROVIDER_SITE_OTHER): Payer: PPO | Admitting: Surgical

## 2020-12-27 ENCOUNTER — Encounter: Payer: Self-pay | Admitting: Surgical

## 2020-12-27 ENCOUNTER — Telehealth: Payer: Self-pay | Admitting: *Deleted

## 2020-12-27 ENCOUNTER — Other Ambulatory Visit: Payer: Self-pay

## 2020-12-27 VITALS — BP 152/65 | HR 75

## 2020-12-27 DIAGNOSIS — Z9011 Acquired absence of right breast and nipple: Secondary | ICD-10-CM

## 2020-12-27 DIAGNOSIS — I480 Paroxysmal atrial fibrillation: Secondary | ICD-10-CM

## 2020-12-27 DIAGNOSIS — C50111 Malignant neoplasm of central portion of right female breast: Secondary | ICD-10-CM

## 2020-12-27 DIAGNOSIS — Z170 Estrogen receptor positive status [ER+]: Secondary | ICD-10-CM

## 2020-12-27 MED ORDER — ONDANSETRON HCL 4 MG PO TABS: 4.0000 mg | ORAL_TABLET | Freq: Three times a day (TID) | ORAL | 0 refills | Status: DC | PRN

## 2020-12-27 MED ORDER — TRAMADOL HCL 50 MG PO TABS
50.0000 mg | ORAL_TABLET | Freq: Four times a day (QID) | ORAL | 0 refills | Status: AC | PRN
Start: 2020-12-27 — End: 2021-01-01

## 2020-12-27 MED ORDER — CEPHALEXIN 500 MG PO CAPS: 500.0000 mg | ORAL_CAPSULE | Freq: Four times a day (QID) | ORAL | 0 refills | Status: AC

## 2020-12-27 NOTE — Telephone Encounter (Signed)
   Primary Electrophysiologist: Dr. Lovena Le  Chart reviewed as part of pre-operative protocol coverage. Because of Bessy Reaney Lopes's past medical history and time since last visit, she will require a follow-up visit in order to better assess preoperative cardiovascular risk.  She has upcoming appointment with Oda Kilts, PA on 01/06/21 and clearance will be addressed at that time. Will route him a note as an FYI.  Per pharmacy review, patient may hold Eliquis 2 days prior to planned procedure and will not require bridging.   Will route to requesting party so they are aware. Will remove from preop pool.   Loel Dubonnet, NP  12/27/2020, 12:53 PM

## 2020-12-27 NOTE — Telephone Encounter (Signed)
Patient with diagnosis of atrial fibrillation on Eliquis for anticoagulation.    Procedure: right breast reconstruction with expander and flex HD Date of procedure: 01/20/21   [id number] Score = 3  {Click here to calculate score.  REFRESH note before signing.       :[id number]}KLTY indicates a 3.2% annual risk of stroke. The patient's score is based upon: CHF History: No HTN History: Yes Diabetes History: No Stroke History: No Vascular Disease History: No Age Score: 1 Gender Score: 1   CrCl 75.2 Platelet count 188  Per office protocol, patient can hold Eliquis for 2 days prior to procedure.   Patient will not need bridging with Lovenox (enoxaparin) around procedure.

## 2020-12-27 NOTE — Telephone Encounter (Signed)
   [facility] Pre-operative Risk Assessment    HEARTCARE STAFF: - Please ensure there is not already an duplicate clearance open for this procedure. - Under Visit Info/Reason for Call, type in Other and utilize the format Clearance MM/DD/YY or Clearance TBD. Do not use dashes or single digits. - If request is for dental extraction, please clarify the # of teeth to be extracted.  Request for surgical clearance:  1. What type of surgery is being performed?  RIGHT BREAST RECONSTRUCTION WITH EXPANDER AND FLEX HD   2. When is this surgery scheduled?  3/28/223   3. What type of clearance is required (medical clearance vs. Pharmacy clearance to hold med vs. Both)?  BOTH   4. Are there any medications that need to be held prior to surgery and how long? ELIQUIS & ASPIRIN   5. Practice name and name of physician performing surgery?  [facility]     6. What is the office phone number?  [phone]   7.   What is the office fax number?  [phone]  8.   Anesthesia type (None, local, MAC, general) ?  GENERAL   Jeanann Lewandowsky 12/27/2020, 7:16 AM  _________________________________________________________________   (provider comments below)

## 2020-12-31 DIAGNOSIS — E039 Hypothyroidism, unspecified: Secondary | ICD-10-CM | POA: Diagnosis not present

## 2020-12-31 DIAGNOSIS — Z8679 Personal history of other diseases of the circulatory system: Secondary | ICD-10-CM | POA: Diagnosis not present

## 2020-12-31 DIAGNOSIS — H25811 Combined forms of age-related cataract, right eye: Secondary | ICD-10-CM | POA: Diagnosis not present

## 2020-12-31 DIAGNOSIS — H47033 Optic nerve hypoplasia, bilateral: Secondary | ICD-10-CM | POA: Diagnosis not present

## 2020-12-31 DIAGNOSIS — K219 Gastro-esophageal reflux disease without esophagitis: Secondary | ICD-10-CM | POA: Diagnosis not present

## 2020-12-31 DIAGNOSIS — H43813 Vitreous degeneration, bilateral: Secondary | ICD-10-CM | POA: Diagnosis not present

## 2020-12-31 DIAGNOSIS — Z79899 Other long term (current) drug therapy: Secondary | ICD-10-CM | POA: Diagnosis not present

## 2020-12-31 DIAGNOSIS — Z7982 Long term (current) use of aspirin: Secondary | ICD-10-CM | POA: Diagnosis not present

## 2020-12-31 DIAGNOSIS — Z7901 Long term (current) use of anticoagulants: Secondary | ICD-10-CM | POA: Diagnosis not present

## 2020-12-31 DIAGNOSIS — E785 Hyperlipidemia, unspecified: Secondary | ICD-10-CM | POA: Diagnosis not present

## 2020-12-31 DIAGNOSIS — I10 Essential (primary) hypertension: Secondary | ICD-10-CM | POA: Diagnosis not present

## 2021-01-01 DIAGNOSIS — I10 Essential (primary) hypertension: Secondary | ICD-10-CM | POA: Diagnosis not present

## 2021-01-01 DIAGNOSIS — H259 Unspecified age-related cataract: Secondary | ICD-10-CM | POA: Diagnosis not present

## 2021-01-06 ENCOUNTER — Encounter: Payer: Self-pay | Admitting: Student

## 2021-01-06 ENCOUNTER — Other Ambulatory Visit: Payer: Self-pay

## 2021-01-06 ENCOUNTER — Ambulatory Visit: Payer: PPO | Admitting: Student

## 2021-01-06 VITALS — BP 126/62 | HR 74 | Ht 64.5 in | Wt 159.8 lb

## 2021-01-06 DIAGNOSIS — I480 Paroxysmal atrial fibrillation: Secondary | ICD-10-CM

## 2021-01-06 DIAGNOSIS — I493 Ventricular premature depolarization: Secondary | ICD-10-CM | POA: Diagnosis not present

## 2021-01-06 DIAGNOSIS — I10 Essential (primary) hypertension: Secondary | ICD-10-CM | POA: Diagnosis not present

## 2021-01-06 DIAGNOSIS — R0600 Dyspnea, unspecified: Secondary | ICD-10-CM

## 2021-01-06 NOTE — Progress Notes (Addendum)
PCP:  Nicoletta Dress, MD Primary Cardiologist: No primary care provider on file. Electrophysiologist: Cristopher Peru, MD   Patricia Alvarado is a 75 y.o. female seen today for Cristopher Peru, MD for cardiac clearance for R breast reconstructive surgery.  Since last being seen in our clinic the patient reports doing very well.  she denies chest pain, palpitations, dyspnea, PND, orthopnea, nausea, vomiting, dizziness, syncope, edema, weight gain, or early satiety.  Past Medical History:  Diagnosis Date  . Basal cell carcinoma 2001   "forehead, between eyebrows"  . Carcinoma of thyroid gland ([place]) 2001  . Chronic lower back pain    "worse is across my hips" (05/26/2016)  . Depression   . Dyslipidemia   . Family history of breast cancer   . Family history of kidney cancer   . Family history of leukemia   . Family history of nonmelanoma skin cancer   . Fibrocystic breast   . Fibromyalgia   . Heart murmur   . History of blood transfusion 1992   "after subcutaneous mastectomies"  . Hypothyroidism   . Malignant melanoma of left ankle ([place]) 2001  . Migraine    "visual; 2-3 times/year" (05/26/2016)  . Mitral valve prolapse   . PONV (postoperative nausea and vomiting)   . Ventricular tachycardia (H[facility])    Hx of, controlled on sotalol therapy   Past Surgical History:  Procedure Laterality Date  . ABDOMINAL HYSTERECTOMY  1998  . ANTERIOR CERVICAL DECOMP/DISCECTOMY FUSION  2001  . BACK SURGERY    . BASAL CELL CARCINOMA EXCISION  2001   "cut it out & did a flap, on forehead between my eyebrows"  . BREAST IMPLANT EXCHANGE Bilateral 2001   [id number]  . BREAST IMPLANT REMOVAL Right 02/05/2020   Procedure: REMOVAL RIGHT BREAST IMPLANT AND CAPSULECTOMY;  Surgeon: Jovita Kussmaul, MD;  Location: [facility];  Service: General;  Laterality: Right;  . BREAST LUMPECTOMY Right 02/05/2020   Procedure: RIGHT BREAST CENTRAL LUMPECTOMY;  Surgeon: Jovita Kussmaul, MD;  Location: [facility];  Service: General;  Laterality: Right;  . CARPAL TUNNEL RELEASE Right 2006  . COLONOSCOPY    . DILATION AND CURETTAGE OF UTERUS  1970s X 2-3  . ELECTROPHYSIOLOGIC STUDY  1994 X 2;2001   "to see it it was sustained VT; cause thyroid levels were causing arrhythmias"  . LAPAROSCOPIC CHOLECYSTECTOMY    . MASTECTOMY Bilateral 1992   "subcutaneous"  . MELANOMA EXCISION Left 2001   "ankle, stage I"  . PLACEMENT OF BREAST IMPLANTS Bilateral 1992   [id number]  . TOTAL THYROIDECTOMY  12/1999   "cancer"  . VENTRICULAR ABLATION SURGERY  2011   ventricular tchycardia    Current Outpatient Medications  Medication Sig Dispense Refill  . acetaminophen (TYLENOL) 500 MG tablet Take 500-1,000 mg by mouth every 6 (six) hours as needed for mild pain or moderate pain.    Marland Kitchen ALPRAZolam (XANAX) 0.5 MG tablet Take 0.5 mg by mouth at bedtime.    Marland Kitchen apixaban (ELIQUIS) 5 MG TABS tablet Take 1 tablet (5 mg total) by mouth 2 (two) times daily. 180 tablet 3  . Ascorbic Acid (VITAMIN C) 1000 MG tablet Take 1 tablet by mouth daily.    Marland Kitchen aspirin 81 MG EC tablet Take 81 mg by mouth daily.    . B Complex-C (SUPER B COMPLEX PO) Take 1 capsule by mouth at bedtime.    . Biotin 5 MG CAPS Take 5 mg by mouth at  bedtime.     . bisacodyl (DULCOLAX) 5 MG EC tablet Take 5 mg by mouth as needed for moderate constipation.    . Calcium 600-200 MG-UNIT tablet Take 2 tablets by mouth daily.    . cetirizine (ZYRTEC) 10 MG tablet Take 10 mg by mouth daily as needed for allergies.     . Coenzyme Q10 (COQ10) 100 MG CAPS Take 100 mg by mouth daily.    Marland Kitchen diltiazem (CARDIZEM CD) 120 MG 24 hr capsule Take 1 capsule (120 mg total) by mouth daily. 90 capsule 3  . escitalopram (LEXAPRO) 20 MG tablet Take 20 mg by mouth daily.    . famotidine (PEPCID) 20 MG tablet Take 20 mg by mouth as needed for heartburn or indigestion.    . flecainide (TAMBOCOR) 100 MG tablet Take 1 tablet by mouth twice a day and may take an additional tablet  daily for increased palpitations 225 tablet 3  . fluticasone (FLONASE) 50 MCG/ACT nasal spray as needed.    . furosemide (LASIX) 20 MG tablet Take 20 mg by mouth as needed for fluid.    Marland Kitchen MAGNESIUM MALATE PO Take 1 tablet by mouth daily.    Marland Kitchen MANGANESE PO Take 40 mg by mouth at bedtime.    . Melatonin 10 MG TABS Take 10 mg by mouth at bedtime.     . Niacin, Antihyperlipidemic, 500 MG TABS Take 1,500 mg by mouth at bedtime.    . ondansetron (ZOFRAN) 4 MG tablet Take 1 tablet (4 mg total) by mouth every 8 (eight) hours as needed for nausea or vomiting. 20 tablet 0  . OVER THE COUNTER MEDICATION Stool Softener 2 daily    . OVER THE COUNTER MEDICATION Milk of Mg-15cc as needed for constipation.    Marland Kitchen OVER THE COUNTER MEDICATION Gas Ex-Take 2 tablets as needed.    . ranolazine (RANEXA) 500 MG 12 hr tablet Take 1 tablet (500 mg total) by mouth 2 (two) times daily. 180 tablet 3  . Thiamine Mononitrate (VITAMIN B1 PO) Take 1 tablet by mouth at bedtime. 100 mg    . thyroid (ARMOUR) 120 MG tablet Take 120 mg by mouth daily before breakfast.    . VITAMIN D, CHOLECALCIFEROL, PO Take 5,000 Units by mouth daily.    . Zinc 50 MG TABS Take 1 tablet by mouth daily.     No current facility-administered medications for this visit.    Allergies  Allergen Reactions  . Quinidine Other (See Comments)    REACTION: increased heart rate  . Dofetilide Other (See Comments)    Elevated QTCs  . Vicodin  [Hydrocodone-Acetaminophen]   . Amoxicillin Itching and Rash  . Atenolol Hives  . Atorvastatin Other (See Comments)    REACTION: muscles pain  . Latex Other (See Comments)    Raw, rash and blisters - Tape only  . Nadolol Hives    Social History   Socioeconomic History  . Marital status: Married    Spouse name: Not on file  . Number of children: Not on file  . Years of education: Not on file  . Highest education level: Not on file  Occupational History  . Occupation: Financial controller GI    Employer: [employer]   Tobacco Use  . Smoking status: Never Smoker  . Smokeless tobacco: Never Used  Vaping Use  . Vaping Use: Never used  Substance and Sexual Activity  . Alcohol use: No  . Drug use: No  . Sexual activity: Yes  Other Topics  Concern  . Not on file  Social History Narrative   Married   Social Determinants of Health   Financial Resource Strain: Not on file  Food Insecurity: Not on file  Transportation Needs: Not on file  Physical Activity: Not on file  Stress: Not on file  Social Connections: Not on file  Intimate Partner Violence: Not on file     Review of Systems: General: No chills, fever, night sweats or weight changes  Cardiovascular:  No chest pain, dyspnea on exertion, edema, orthopnea, palpitations, paroxysmal nocturnal dyspnea Dermatological: No rash, lesions or masses Respiratory: No cough, dyspnea Urologic: No hematuria, dysuria Abdominal: No nausea, vomiting, diarrhea, bright red blood per rectum, melena, or hematemesis Neurologic: No visual changes, weakness, changes in mental status All other systems reviewed and are otherwise negative except as noted above.  Physical Exam: Vitals:   01/06/21 1019  BP: 126/62  Pulse: 74  SpO2: 97%  Weight: 159 lb 12.8 oz (72.5 kg)  Height: 5' 4.5" (1.638 m)    GEN- The patient is well appearing, alert and oriented x 3 today.   HEENT: normocephalic, atraumatic; sclera clear, conjunctiva pink; hearing intact; oropharynx clear; neck supple, no JVP Lymph- no cervical lymphadenopathy Lungs- Clear to ausculation bilaterally, normal work of breathing.  No wheezes, rales, rhonchi Heart- Regular rate and rhythm, no murmurs, rubs or gallops, PMI not laterally displaced GI- soft, non-tender, non-distended, bowel sounds present, no hepatosplenomegaly Extremities- no clubbing, cyanosis, or edema; DP/PT/radial pulses 2+ bilaterally MS- no significant deformity or atrophy Skin- warm and dry, no rash or lesion Psych- euthymic mood, full  affect Neuro- strength and sensation are intact  EKG is ordered. Personal review of EKG from today shows NSR at 74 bpm,, QRS 104 ms (stable)  Additional studies reviewed include: Previous EP notes  Assessment and Plan:  1. PAF - EKG today shows NSR  Continue flecainide and diltiazem. QRS 104 ms 2. PVC's -  Chronically has been minimal on flecainide. 3. Dyspnea -  Normal LVEF on echo 06/2020.  4. HTN - Continue chronic medications 5. Cardiac Clearance for R breast reconstructive surgery Pt is clear for surgery from a cardiac perspective with relatively low risk of peri-operative complications from a cardiac perspective by Revised Cardiac Risk Index [place] Criteria).  ADDENDUM OK to hold ASA for 5 days per Dr. Dawna Part, PA-C  01/06/21 12:35 PM

## 2021-01-13 ENCOUNTER — Telehealth: Payer: Self-pay

## 2021-01-13 NOTE — Telephone Encounter (Signed)
Patient called to say that she is scheduled to have surgery with Dr. Marla Roe on 01/20/2021 and she would like to know if she needs to stop taking any of her medications or supplements and if so, when.  Patient said that she also left a message with Pre-Admit Testing at [facility] as well.  Please call.

## 2021-01-13 NOTE — Telephone Encounter (Signed)
Returned patients call. LMVM, [facility] indicated to stop Eliquis 2 days prior to surgery. Matt received message from the PA at [facility]. Verified can stop the aspirin 81 5 days prior to surgery. In regards to her supplements, stop taking a week prior as she did with the previous surgery. Call our office if there are any further questions.

## 2021-01-14 DIAGNOSIS — H259 Unspecified age-related cataract: Secondary | ICD-10-CM | POA: Diagnosis not present

## 2021-01-14 DIAGNOSIS — I10 Essential (primary) hypertension: Secondary | ICD-10-CM | POA: Diagnosis not present

## 2021-01-14 DIAGNOSIS — Z923 Personal history of irradiation: Secondary | ICD-10-CM | POA: Diagnosis not present

## 2021-01-14 DIAGNOSIS — Z7982 Long term (current) use of aspirin: Secondary | ICD-10-CM | POA: Diagnosis not present

## 2021-01-14 DIAGNOSIS — K219 Gastro-esophageal reflux disease without esophagitis: Secondary | ICD-10-CM | POA: Diagnosis not present

## 2021-01-14 DIAGNOSIS — E785 Hyperlipidemia, unspecified: Secondary | ICD-10-CM | POA: Diagnosis not present

## 2021-01-14 DIAGNOSIS — Z79890 Hormone replacement therapy (postmenopausal): Secondary | ICD-10-CM | POA: Diagnosis not present

## 2021-01-14 DIAGNOSIS — Z7901 Long term (current) use of anticoagulants: Secondary | ICD-10-CM | POA: Diagnosis not present

## 2021-01-14 DIAGNOSIS — Z853 Personal history of malignant neoplasm of breast: Secondary | ICD-10-CM | POA: Diagnosis not present

## 2021-01-14 DIAGNOSIS — M797 Fibromyalgia: Secondary | ICD-10-CM | POA: Diagnosis not present

## 2021-01-14 DIAGNOSIS — I4891 Unspecified atrial fibrillation: Secondary | ICD-10-CM | POA: Diagnosis not present

## 2021-01-14 DIAGNOSIS — Z85850 Personal history of malignant neoplasm of thyroid: Secondary | ICD-10-CM | POA: Diagnosis not present

## 2021-01-14 DIAGNOSIS — H25812 Combined forms of age-related cataract, left eye: Secondary | ICD-10-CM | POA: Diagnosis not present

## 2021-01-14 DIAGNOSIS — Z9221 Personal history of antineoplastic chemotherapy: Secondary | ICD-10-CM | POA: Diagnosis not present

## 2021-01-14 DIAGNOSIS — E890 Postprocedural hypothyroidism: Secondary | ICD-10-CM | POA: Diagnosis not present

## 2021-01-15 ENCOUNTER — Ambulatory Visit: Payer: PPO | Admitting: Hematology and Oncology

## 2021-01-17 ENCOUNTER — Other Ambulatory Visit (HOSPITAL_COMMUNITY)
Admission: RE | Admit: 2021-01-17 | Discharge: 2021-01-17 | Disposition: A | Discharge status: Home / Self Care | Payer: PPO | Source: Ambulatory Visit | Attending: Plastic Surgery | Admitting: Plastic Surgery

## 2021-01-17 ENCOUNTER — Encounter (HOSPITAL_COMMUNITY): Payer: Self-pay | Admitting: Plastic Surgery

## 2021-01-17 DIAGNOSIS — Z20822 Contact with and (suspected) exposure to covid-19: Secondary | ICD-10-CM | POA: Diagnosis not present

## 2021-01-17 DIAGNOSIS — C50912 Malignant neoplasm of unspecified site of left female breast: Secondary | ICD-10-CM | POA: Diagnosis not present

## 2021-01-17 DIAGNOSIS — C50911 Malignant neoplasm of unspecified site of right female breast: Secondary | ICD-10-CM | POA: Diagnosis not present

## 2021-01-17 DIAGNOSIS — Z01812 Encounter for preprocedural laboratory examination: Secondary | ICD-10-CM | POA: Diagnosis not present

## 2021-01-17 LAB — SARS CORONAVIRUS 2 (TAT 6-24 HRS): SARS Coronavirus 2: NEGATIVE

## 2021-01-17 NOTE — Progress Notes (Signed)
Spoke with pt for pre-op call. Pt has hx of Ventricular tachycardia and A-fib. States both are under control. Pt's cardiologist is Dr. Beckie Salts. Pt states she can have high blood pressure at times, but is not treated for it. Pt states she is not diabetic.   Covid test done today, result pending. Pt states she's been in quarantine since the test was done and understands that she stays in quarantine until she comes to the hospital on Monday.

## 2021-01-20 ENCOUNTER — Ambulatory Visit (HOSPITAL_COMMUNITY)
Admission: RE | Admit: 2021-01-20 | Discharge: 2021-01-20 | Disposition: A | Discharge status: Home / Self Care | Payer: PPO | Attending: Plastic Surgery | Admitting: Plastic Surgery

## 2021-01-20 ENCOUNTER — Ambulatory Visit (HOSPITAL_COMMUNITY): Payer: PPO | Admitting: Anesthesiology

## 2021-01-20 ENCOUNTER — Encounter (HOSPITAL_COMMUNITY): Payer: Self-pay | Admitting: Plastic Surgery

## 2021-01-20 ENCOUNTER — Telehealth: Payer: Self-pay | Admitting: Plastic Surgery

## 2021-01-20 ENCOUNTER — Other Ambulatory Visit: Payer: Self-pay

## 2021-01-20 ENCOUNTER — Encounter (HOSPITAL_COMMUNITY)
Admission: RE | Disposition: A | Discharge status: Home / Self Care | Payer: Self-pay | Source: Home / Self Care | Attending: Plastic Surgery

## 2021-01-20 DIAGNOSIS — C50911 Malignant neoplasm of unspecified site of right female breast: Secondary | ICD-10-CM

## 2021-01-20 DIAGNOSIS — I480 Paroxysmal atrial fibrillation: Secondary | ICD-10-CM | POA: Insufficient documentation

## 2021-01-20 DIAGNOSIS — Z801 Family history of malignant neoplasm of trachea, bronchus and lung: Secondary | ICD-10-CM | POA: Diagnosis not present

## 2021-01-20 DIAGNOSIS — Z853 Personal history of malignant neoplasm of breast: Secondary | ICD-10-CM | POA: Insufficient documentation

## 2021-01-20 DIAGNOSIS — C50111 Malignant neoplasm of central portion of right female breast: Secondary | ICD-10-CM | POA: Diagnosis not present

## 2021-01-20 DIAGNOSIS — Z91040 Latex allergy status: Secondary | ICD-10-CM | POA: Diagnosis not present

## 2021-01-20 DIAGNOSIS — Z421 Encounter for breast reconstruction following mastectomy: Secondary | ICD-10-CM | POA: Insufficient documentation

## 2021-01-20 DIAGNOSIS — Z803 Family history of malignant neoplasm of breast: Secondary | ICD-10-CM | POA: Insufficient documentation

## 2021-01-20 DIAGNOSIS — Z8249 Family history of ischemic heart disease and other diseases of the circulatory system: Secondary | ICD-10-CM | POA: Diagnosis not present

## 2021-01-20 DIAGNOSIS — Z808 Family history of malignant neoplasm of other organs or systems: Secondary | ICD-10-CM | POA: Diagnosis not present

## 2021-01-20 DIAGNOSIS — Z888 Allergy status to other drugs, medicaments and biological substances status: Secondary | ICD-10-CM | POA: Diagnosis not present

## 2021-01-20 DIAGNOSIS — Z79899 Other long term (current) drug therapy: Secondary | ICD-10-CM | POA: Diagnosis not present

## 2021-01-20 DIAGNOSIS — I10 Essential (primary) hypertension: Secondary | ICD-10-CM | POA: Diagnosis not present

## 2021-01-20 DIAGNOSIS — Z885 Allergy status to narcotic agent status: Secondary | ICD-10-CM | POA: Diagnosis not present

## 2021-01-20 DIAGNOSIS — Z806 Family history of leukemia: Secondary | ICD-10-CM | POA: Diagnosis not present

## 2021-01-20 DIAGNOSIS — Z8051 Family history of malignant neoplasm of kidney: Secondary | ICD-10-CM | POA: Insufficient documentation

## 2021-01-20 DIAGNOSIS — Z170 Estrogen receptor positive status [ER+]: Secondary | ICD-10-CM | POA: Diagnosis not present

## 2021-01-20 HISTORY — DX: Gastro-esophageal reflux disease without esophagitis: K21.9

## 2021-01-20 HISTORY — DX: Unspecified osteoarthritis, unspecified site: M19.90

## 2021-01-20 HISTORY — DX: Essential (primary) hypertension: I10

## 2021-01-20 HISTORY — PX: BREAST RECONSTRUCTION WITH PLACEMENT OF TISSUE EXPANDER AND FLEX HD (ACELLULAR HYDRATED DERMIS): SHX6295

## 2021-01-20 HISTORY — DX: Malignant neoplasm of unspecified site of unspecified female breast: C50.919

## 2021-01-20 HISTORY — DX: Unspecified atrial fibrillation: I48.91

## 2021-01-20 LAB — BASIC METABOLIC PANEL
Anion gap: 9 (ref 5–15)
BUN: 12 mg/dL (ref 8–23)
CO2: 24 mmol/L (ref 22–32)
Calcium: 9.5 mg/dL (ref 8.9–10.3)
Chloride: 106 mmol/L (ref 98–111)
Creatinine, Ser: 0.76 mg/dL (ref 0.44–1.00)
GFR, Estimated: >60 mL/min (ref >60)
Glucose, Bld: 90 mg/dL (ref 70–99)
Potassium: 3.8 mmol/L (ref 3.5–5.1)
Sodium: 139 mmol/L (ref 135–145)

## 2021-01-20 LAB — CBC
HCT: 41.8 % (ref 36.0–46.0)
Hemoglobin: 14.3 g/dL (ref 12.0–15.0)
MCH: 33 pg (ref 26.0–34.0)
MCHC: 34.2 g/dL (ref 30.0–36.0)
MCV: 96.5 fL (ref 80.0–100.0)
Platelets: 170 10*3/uL (ref 150–400)
RBC: 4.33 MIL/uL (ref 3.87–5.11)
RDW: 13.4 % (ref 11.5–15.5)
WBC: 4.1 10*3/uL (ref 4.0–10.5)
nRBC: 0 % (ref 0.0–0.2)

## 2021-01-20 SURGERY — BREAST RECONSTRUCTION WITH PLACEMENT OF TISSUE EXPANDER AND FLEX HD (ACELLULAR HYDRATED DERMIS)
Anesthesia: General | Site: Breast | Laterality: Right

## 2021-01-20 MED ORDER — MIDAZOLAM HCL 2 MG/2ML IJ SOLN
0.5000 mg | Freq: Once | INTRAMUSCULAR | Status: AC
  Administered 2021-01-20: 0.5 mg via INTRAVENOUS

## 2021-01-20 MED ORDER — ONDANSETRON HCL 4 MG/2ML IJ SOLN
INTRAMUSCULAR | Status: AC
  Filled 2021-01-20: qty 2

## 2021-01-20 MED ORDER — LIDOCAINE-EPINEPHRINE 1 %-1:100000 IJ SOLN
INTRAMUSCULAR | Status: DC | PRN
  Administered 2021-01-20: 5 mL

## 2021-01-20 MED ORDER — LACTATED RINGERS IV SOLN: INTRAVENOUS | Status: DC

## 2021-01-20 MED ORDER — FENTANYL CITRATE (PF) 250 MCG/5ML IJ SOLN
INTRAMUSCULAR | Status: DC | PRN
  Administered 2021-01-20: 75 ug via INTRAVENOUS

## 2021-01-20 MED ORDER — SODIUM CHLORIDE 0.9 % IV SOLN
INTRAVENOUS | Status: DC
  Filled 2021-01-20: qty 10

## 2021-01-20 MED ORDER — LIDOCAINE 2% (20 MG/ML) 5 ML SYRINGE
INTRAMUSCULAR | Status: AC
  Filled 2021-01-20: qty 5

## 2021-01-20 MED ORDER — MIDAZOLAM HCL 2 MG/2ML IJ SOLN
1.0000 mg | Freq: Once | INTRAMUSCULAR | Status: AC
  Administered 2021-01-20: 1 mg via INTRAVENOUS

## 2021-01-20 MED ORDER — FENTANYL CITRATE (PF) 250 MCG/5ML IJ SOLN
INTRAMUSCULAR | Status: AC
  Filled 2021-01-20: qty 5

## 2021-01-20 MED ORDER — OXYCODONE HCL 5 MG/5ML PO SOLN: 5.0000 mg | Freq: Once | ORAL | Status: DC | PRN

## 2021-01-20 MED ORDER — CIPROFLOXACIN IN D5W 400 MG/200ML IV SOLN
400.0000 mg | INTRAVENOUS | Status: AC
  Administered 2021-01-20: 400 mg via INTRAVENOUS
  Filled 2021-01-20: qty 200

## 2021-01-20 MED ORDER — SUGAMMADEX SODIUM 200 MG/2ML IV SOLN
INTRAVENOUS | Status: DC | PRN
  Administered 2021-01-20: 150 mg via INTRAVENOUS

## 2021-01-20 MED ORDER — FENTANYL CITRATE (PF) 100 MCG/2ML IJ SOLN: 25.0000 ug | INTRAMUSCULAR | Status: DC | PRN

## 2021-01-20 MED ORDER — SODIUM CHLORIDE 0.9 % IV SOLN
INTRAVENOUS | Status: DC | PRN
  Administered 2021-01-20: 40 ug via INTRAVENOUS

## 2021-01-20 MED ORDER — ROCURONIUM BROMIDE 10 MG/ML (PF) SYRINGE
PREFILLED_SYRINGE | INTRAVENOUS | Status: AC
  Filled 2021-01-20: qty 10

## 2021-01-20 MED ORDER — SODIUM CHLORIDE 0.9 % IV SOLN: 250.0000 mL | INTRAVENOUS | Status: DC | PRN

## 2021-01-20 MED ORDER — SODIUM CHLORIDE 0.9% FLUSH: 3.0000 mL | INTRAVENOUS | Status: DC | PRN

## 2021-01-20 MED ORDER — LIDOCAINE 2% (20 MG/ML) 5 ML SYRINGE
INTRAMUSCULAR | Status: DC | PRN
  Administered 2021-01-20: 80 mg via INTRAVENOUS

## 2021-01-20 MED ORDER — MIDAZOLAM HCL 2 MG/2ML IJ SOLN
INTRAMUSCULAR | Status: AC
  Filled 2021-01-20: qty 2

## 2021-01-20 MED ORDER — 0.9 % SODIUM CHLORIDE (POUR BTL) OPTIME
TOPICAL | Status: DC | PRN
  Administered 2021-01-20: 1000 mL

## 2021-01-20 MED ORDER — SODIUM CHLORIDE 0.9 % IV SOLN
INTRAVENOUS | Status: AC | PRN
  Administered 2021-01-20: 100 mL

## 2021-01-20 MED ORDER — PROPOFOL 10 MG/ML IV BOLUS
INTRAVENOUS | Status: DC | PRN
  Administered 2021-01-20: 130 mg via INTRAVENOUS

## 2021-01-20 MED ORDER — ORAL CARE MOUTH RINSE: 15.0000 mL | Freq: Once | OROMUCOSAL | Status: AC

## 2021-01-20 MED ORDER — FENTANYL CITRATE (PF) 100 MCG/2ML IJ SOLN
INTRAMUSCULAR | Status: AC
  Administered 2021-01-20: 25 ug via INTRAVENOUS
  Filled 2021-01-20: qty 2

## 2021-01-20 MED ORDER — ONDANSETRON HCL 4 MG/2ML IJ SOLN
INTRAMUSCULAR | Status: DC | PRN
  Administered 2021-01-20: 4 mg via INTRAVENOUS

## 2021-01-20 MED ORDER — OXYCODONE HCL 5 MG PO TABS: 5.0000 mg | ORAL_TABLET | Freq: Once | ORAL | Status: DC | PRN

## 2021-01-20 MED ORDER — PROMETHAZINE HCL 25 MG/ML IJ SOLN: 6.2500 mg | INTRAMUSCULAR | Status: DC | PRN

## 2021-01-20 MED ORDER — ACETAMINOPHEN 500 MG PO TABS
1000.0000 mg | ORAL_TABLET | Freq: Once | ORAL | Status: AC
  Administered 2021-01-20: 1000 mg via ORAL
  Filled 2021-01-20: qty 2

## 2021-01-20 MED ORDER — GLYCOPYRROLATE PF 0.2 MG/ML IJ SOSY
PREFILLED_SYRINGE | INTRAMUSCULAR | Status: DC | PRN
  Administered 2021-01-20: .1 mg via INTRAVENOUS

## 2021-01-20 MED ORDER — FENTANYL CITRATE (PF) 100 MCG/2ML IJ SOLN
25.0000 ug | INTRAMUSCULAR | Status: DC | PRN
  Administered 2021-01-20: 25 ug via INTRAVENOUS
  Administered 2021-01-20: 50 ug via INTRAVENOUS

## 2021-01-20 MED ORDER — DEXAMETHASONE SODIUM PHOSPHATE 10 MG/ML IJ SOLN
INTRAMUSCULAR | Status: DC | PRN
  Administered 2021-01-20: 5 mg via INTRAVENOUS

## 2021-01-20 MED ORDER — LIDOCAINE-EPINEPHRINE 1 %-1:100000 IJ SOLN
INTRAMUSCULAR | Status: AC
  Filled 2021-01-20: qty 1

## 2021-01-20 MED ORDER — PHENYLEPHRINE HCL-NACL 10-0.9 MG/250ML-% IV SOLN
INTRAVENOUS | Status: AC
  Filled 2021-01-20: qty 250

## 2021-01-20 MED ORDER — ROCURONIUM BROMIDE 10 MG/ML (PF) SYRINGE
PREFILLED_SYRINGE | INTRAVENOUS | Status: DC | PRN
  Administered 2021-01-20: 10 mg via INTRAVENOUS
  Administered 2021-01-20: 40 mg via INTRAVENOUS

## 2021-01-20 MED ORDER — MIDAZOLAM HCL 2 MG/2ML IJ SOLN
INTRAMUSCULAR | Status: DC | PRN
  Administered 2021-01-20 (×2): 1 mg via INTRAVENOUS

## 2021-01-20 MED ORDER — CHLORHEXIDINE GLUCONATE CLOTH 2 % EX PADS: 6.0000 | MEDICATED_PAD | Freq: Once | CUTANEOUS | Status: DC

## 2021-01-20 MED ORDER — EPHEDRINE SULFATE-NACL 50-0.9 MG/10ML-% IV SOSY
PREFILLED_SYRINGE | INTRAVENOUS | Status: DC | PRN
  Administered 2021-01-20 (×2): 5 mg via INTRAVENOUS

## 2021-01-20 MED ORDER — SODIUM CHLORIDE 0.9% FLUSH: 3.0000 mL | Freq: Two times a day (BID) | INTRAVENOUS | Status: DC

## 2021-01-20 MED ORDER — CHLORHEXIDINE GLUCONATE 0.12 % MT SOLN
15.0000 mL | Freq: Once | OROMUCOSAL | Status: AC
  Administered 2021-01-20: 15 mL via OROMUCOSAL
  Filled 2021-01-20: qty 15

## 2021-01-20 MED ORDER — SODIUM CHLORIDE 0.9 % IV SOLN
INTRAVENOUS | Status: DC | PRN
  Administered 2021-01-20: 50 ug/min via INTRAVENOUS

## 2021-01-20 SURGICAL SUPPLY — 52 items
BAG DECANTER FOR FLEXI CONT (MISCELLANEOUS) ×2 IMPLANT
BINDER BREAST LRG (GAUZE/BANDAGES/DRESSINGS) IMPLANT
BINDER BREAST XLRG (GAUZE/BANDAGES/DRESSINGS) IMPLANT
BIOPATCH RED 1 DISK 7.0 (GAUZE/BANDAGES/DRESSINGS) ×4 IMPLANT
CANISTER SUCT 3000ML PPV (MISCELLANEOUS) ×2 IMPLANT
CHLORAPREP W/TINT 26 (MISCELLANEOUS) ×2 IMPLANT
COVER SURGICAL LIGHT HANDLE (MISCELLANEOUS) ×2 IMPLANT
COVER WAND RF STERILE (DRAPES) IMPLANT
DECANTER SPIKE VIAL GLASS SM (MISCELLANEOUS) ×2 IMPLANT
DERMABOND ADHESIVE PROPEN (GAUZE/BANDAGES/DRESSINGS) ×1
DERMABOND ADVANCED (GAUZE/BANDAGES/DRESSINGS)
DERMABOND ADVANCED .7 DNX12 (GAUZE/BANDAGES/DRESSINGS) IMPLANT
DERMABOND ADVANCED .7 DNX6 (GAUZE/BANDAGES/DRESSINGS) ×1 IMPLANT
DRAIN CHANNEL 19F RND (DRAIN) IMPLANT
DRAPE HALF SHEET 40X57 (DRAPES) ×2 IMPLANT
DRAPE ORTHO SPLIT 77X108 STRL (DRAPES) ×4
DRAPE SURG 17X23 STRL (DRAPES) ×8 IMPLANT
DRAPE SURG ORHT 6 SPLT 77X108 (DRAPES) ×2 IMPLANT
DRAPE WARM FLUID 44X44 (DRAPES) IMPLANT
DRSG MEPILEX BORDER 4X8 (GAUZE/BANDAGES/DRESSINGS) ×2 IMPLANT
DRSG PAD ABDOMINAL 8X10 ST (GAUZE/BANDAGES/DRESSINGS) ×4 IMPLANT
ELECT BLADE 4.0 EZ CLEAN MEGAD (MISCELLANEOUS) ×2
ELECT REM PT RETURN 9FT ADLT (ELECTROSURGICAL) ×2
ELECTRODE BLDE 4.0 EZ CLN MEGD (MISCELLANEOUS) ×1 IMPLANT
ELECTRODE REM PT RTRN 9FT ADLT (ELECTROSURGICAL) ×1 IMPLANT
EVACUATOR SILICONE 100CC (DRAIN) ×2 IMPLANT
GAUZE SPONGE 4X4 12PLY STRL (GAUZE/BANDAGES/DRESSINGS) ×2 IMPLANT
GLOVE BIO SURGEON STRL SZ 6.5 (GLOVE) ×6 IMPLANT
GOWN STRL REUS W/ TWL LRG LVL3 (GOWN DISPOSABLE) ×2 IMPLANT
GOWN STRL REUS W/TWL LRG LVL3 (GOWN DISPOSABLE) ×4
GRAFT FLEX HD 8X16 THICK (Tissue Mesh) ×2 IMPLANT
IMPL BREAST 300CC (Breast) ×1 IMPLANT
IMPLANT BREAST 300CC (Breast) ×2 IMPLANT
KIT BASIN OR (CUSTOM PROCEDURE TRAY) ×2 IMPLANT
KIT FILL ASEPTIC TRANSFER (MISCELLANEOUS) ×2 IMPLANT
KIT TURNOVER KIT B (KITS) ×2 IMPLANT
NS IRRIG 1000ML POUR BTL (IV SOLUTION) ×4 IMPLANT
PACK GENERAL/GYN (CUSTOM PROCEDURE TRAY) ×2 IMPLANT
PAD ARMBOARD 7.5X6 YLW CONV (MISCELLANEOUS) ×2 IMPLANT
PIN SAFETY STERILE (MISCELLANEOUS) ×2 IMPLANT
SET ASEPTIC TRANSFER (MISCELLANEOUS) IMPLANT
SUT MNCRL AB 4-0 PS2 18 (SUTURE) ×4 IMPLANT
SUT MON AB 3-0 SH 27 (SUTURE) ×4
SUT MON AB 3-0 SH27 (SUTURE) ×2 IMPLANT
SUT MON AB 5-0 PS2 18 (SUTURE) ×4 IMPLANT
SUT PDS AB 2-0 CT1 27 (SUTURE) ×8 IMPLANT
SUT PDS AB 3-0 SH 27 (SUTURE) ×2 IMPLANT
SUT SILK 4 0 PS 2 (SUTURE) ×2 IMPLANT
SUT VIC AB 3-0 SH 18 (SUTURE) IMPLANT
TOWEL GREEN STERILE (TOWEL DISPOSABLE) ×2 IMPLANT
TOWEL GREEN STERILE FF (TOWEL DISPOSABLE) ×2 IMPLANT
TRAY FOLEY MTR SLVR 14FR STAT (SET/KITS/TRAYS/PACK) IMPLANT

## 2021-01-20 NOTE — Telephone Encounter (Signed)
Patient called to say she was home from surgery and she had a panic attack after surgery. Dr. Marla Roe had gone to another surgery so they could not check with her about it so she was following up to see if a prescription for a muscle relaxer or valium in case she needed it. Her preferred pharmacy is [facility] in [place]. Please call to advise.

## 2021-01-20 NOTE — Discharge Instructions (Signed)
INSTRUCTIONS FOR AFTER SURGERY   You will likely have some questions about what to expect following your operation.  The following information will help you and your family understand what to expect when you are discharged from the hospital.  Following these guidelines will help ensure a smooth recovery and reduce risks of complications.  Postoperative instructions include information on: diet, wound care, medications and physical activity.  AFTER SURGERY Expect to go home after the procedure.  In some cases, you may need to spend one night in the hospital for observation.  DIET This surgery does not require a specific diet.  However, I have to mention that the healthier you eat the better your body can start healing. It is important to increasing your protein intake.  This means limiting the foods with added sugar.  Focus on fruits and vegetables and some meat. It is very important to drink water after your surgery.  If your urine is bright yellow, then it is concentrated, and you need to drink more water.  As a general rule after surgery, you should have 8 ounces of water every hour while awake.  If you find you are persistently nauseated or unable to take in liquids let us know.  NO TOBACCO USE or EXPOSURE.  This will slow your healing process and increase the risk of a wound.  WOUND CARE If you have a drain: Clean with baby wipes for 3-5 days and then you can shower.  If you have a binder you may remove it to shower and then put it back on. If you have steri-strips / tape directly attached to your skin leave them in place. It is OK to get these wet.  No baths, pools or hot tubs for two weeks. We close your incision to leave the smallest and best-looking scar. No ointment or creams on your incisions until given the go ahead.  Especially not Neosporin (Too many skin reactions with this one).  A few weeks after surgery you can use Mederma and start massaging the scar. We ask you to wear your binder or  sports bra for the first 6 weeks around the clock, including while sleeping. This provides added comfort and helps reduce the fluid accumulation at the surgery site.  ACTIVITY No heavy lifting until cleared by the doctor.  It is OK to walk and climb stairs. In fact, moving your legs is very important to decrease your risk of a blood clot.  It will also help keep you from getting deconditioned.  Every 1 to 2 hours get up and walk for 5 minutes. This will help with a quicker recovery back to normal.  Let pain be your guide so you don't do too much.  NO, you cannot do the spring cleaning and don't plan on taking care of anyone else.  This is your time for TLC.   WORK Everyone returns to work at different times. As a rough guide, most people take at least 1 - 2 weeks off prior to returning to work. If you need documentation for your job, bring the forms to your postoperative follow up visit.  DRIVING Arrange for someone to bring you home from the hospital.  You may be able to drive a few days after surgery but not while taking any narcotics or valium.  BOWEL MOVEMENTS Constipation can occur after anesthesia and while taking pain medication.  It is important to stay ahead for your comfort.  We recommend taking Milk of Magnesia (2 tablespoons; twice   a day) while taking the pain pills.  SEROMA This is fluid your body tried to put in the surgical site.  This is normal but if it creates excessive pain and swelling let us know.  It usually decreases in a few weeks.  MEDICATIONS and PAIN CONTROL At your preoperative visit for you history and physical you were given the following medications: 1. An antibiotic: Start this medication when you get home and take according to the instructions on the bottle. 2. Zofran 4 mg:  This is to treat nausea and vomiting.  You can take this every 6 hours as needed and only if needed. 3. Norco (hydrocodone/acetaminophen) 5/325 mg:  This is only to be used after you have  taken the motrin or the tylenol. Every 8 hours as needed. Over the counter Medication to take: 4. Ibuprofen (Motrin) 600 mg:  Take this every 6 hours.  If you have additional pain then take 500 mg of the tylenol.  Only take the Norco after you have tried these two. 5. Miralax or stool softener of choice: Take this according to the bottle if you take the [place] Call your surgeon's office if any of the following occur: . Fever 101 degrees F or greater . Excessive bleeding or fluid from the incision site. . Pain that increases over time without aid from the medications . Redness, warmth, or pus draining from incision sites . Persistent nausea or inability to take in liquids . Severe misshapen area that underwent the operation.  [facility] Plastic Surgery Specialist  What is the benefit of having a drain?  During surgery your tissue layers are separated.  This raw surface stimulates your body to fill the space with serous fluid.  This is normal but you don't want that fluid to collect and prevent healing.  A fluid collection can also become infected.  The Jackson-Pratt (JP) drain is used to eliminate this collection of fluid and allow the tissue to heal together.    Jackson-Pratt (JP) bulb    How to care for your drainage and suction unit at home Your drainage catheter will be connected to a collection device. The vacuum caused when the device is compressed allows drainage to collect in the device.    Wendee Copp your hands with soap and water before and after touching the system. . Empty the JP drain every 12 hours once you get home from your procedure. . Record the fluid amount on the record sheet included. . Start with stripping the drain tube to push the clots or excess fluid to the bulb.  Do this by pinching the tube with one hand near your skin.  Then with the other hand squeeze the tubing and work it toward the bulb.  This should be done several times a day.  This may collapse the  tube which will correct on its own.   . Use a safety pin to attach your collection device to your clothing so there is no tension on the insertion site.   . If you have drainage at the skin insertion site, you can apply a gauze dressing and secure it with tape. . If the drain falls out, apply a gauze dressing over the drain insertion site and secure with tape.   To empty the collection device:   . Release the stopper on the top of the collection unit (bulb).  Signa Kell contents into a measuring container such as a plastic medicine cup.  . Record the  day and amount of drainage on the attached sheet. . This should be done at least twice a day.    To compress the Jackson-Pratt Bulb:  . Release the stopper at the top of the bulb. Marland Kitchen Squeeze the bulb tightly in your fist, squeezing air out of the bulb.  . Replace the stopper while the bulb is compressed.  . Be careful not to spill the contents when squeezing the bulb. . The drainage will start bright red and turn to pink and then yellow with time. . IMPORTANT: If the bulb is not squeezed before adding the stopper it will not draw out the fluid.  Care for the JP drain site and your skin daily:  . You may shower three days after surgery. . Secure the drain to a ribbon or cloth around your waist while showering so it does not pull out while showering. . Be sure your hands are cleaned with soap and water. . Use a clean wet cotton swab to clean the skin around the drain site.  . Use another cotton swab to place Vaseline or antibiotic ointment on the skin around the drain.     Contact your physician if any of the following occur:  Marland Kitchen The fluid in the bulb becomes cloudy. . Your temperature is greater than 101.4.  Marland Kitchen The incision opens. . If you have drainage at the skin insertion site, you can apply a gauze dressing and secure it with tape. . If the drain falls out, apply a gauze dressing over the drain insertion site and secure with tape.  . You will  usually have more drainage when you are active than while you rest or are asleep. If the drainage increases significantly or is bloody call the physician                             Bring this record with you to each office visit Date  Drainage Volume  Date   Drainage volume

## 2021-01-20 NOTE — Interval H&P Note (Signed)
History and Physical Interval Note:  01/20/2021 10:47 AM  Patricia Alvarado  has presented today for surgery, with the diagnosis of Malignant neoplasm of central portion of right breast in female, estrogen receptor positive.  The various methods of treatment have been discussed with the patient and family. After consideration of risks, benefits and other options for treatment, the patient has consented to  Procedure(s) with comments: BREAST RECONSTRUCTION WITH PLACEMENT OF TISSUE EXPANDER AND FLEX HD (ACELLULAR HYDRATED DERMIS) (Right) - 2 hours as a surgical intervention.  The patient's history has been reviewed, patient examined, no change in status, stable for surgery.  I have reviewed the patient's chart and labs.  Questions were answered to the patient's satisfaction.     Loel Lofty Rosalita Carey

## 2021-01-20 NOTE — Transfer of Care (Signed)
Immediate Anesthesia Transfer of Care Note  Patient: Patricia Alvarado  Procedure(s) Performed: BREAST RECONSTRUCTION WITH PLACEMENT OF TISSUE EXPANDER AND FLEX HD (ACELLULAR HYDRATED DERMIS) (Right Breast)  Patient Location: PACU  Anesthesia Type:General  Level of Consciousness: awake, patient cooperative and responds to stimulation  Airway & Oxygen Therapy: Patient Spontanous Breathing and Patient connected to face mask oxygen  Post-op Assessment: Report given to RN, Post -op Vital signs reviewed and stable and Patient moving all extremities  Post vital signs: Reviewed and stable  Last Vitals:  Vitals Value Taken Time  BP 146/65 01/20/21 1258  Temp    Pulse 82 01/20/21 1259  Resp 22 01/20/21 1259  SpO2 100 % 01/20/21 1259  Vitals shown include unvalidated device data.  Last Pain:  Vitals:   01/20/21 0906  TempSrc: Oral         Complications: No complications documented.

## 2021-01-20 NOTE — Anesthesia Preprocedure Evaluation (Addendum)
Anesthesia Evaluation  Patient identified by MRN, date of birth, ID band Patient awake    Reviewed: Allergy & Precautions, NPO status , Patient's Chart, lab work & pertinent test results  History of Anesthesia Complications (+) PONV and history of anesthetic complications  Airway Mallampati: II  TM Distance: >3 FB Neck ROM: Full    Dental no notable dental hx.    Pulmonary neg pulmonary ROS,    Pulmonary exam normal        Cardiovascular hypertension, Pt. on medications Normal cardiovascular exam+ dysrhythmias (on Eliquis) Atrial Fibrillation and Ventricular Tachycardia   TTE 2021: EF 55-60%, grade I DD, P2 prolpase with late systolic MR that is mild, mild AR, small PFO with predominantly left to right shunting across the atrial septum   Neuro/Psych  Headaches, Depression    GI/Hepatic Neg liver ROS, GERD  Medicated and Controlled,  Endo/Other  Hypothyroidism   Renal/GU negative Renal ROS  negative genitourinary   Musculoskeletal  (+) Arthritis , Fibromyalgia -  Abdominal   Peds  Hematology negative hematology ROS (+)   Anesthesia Other Findings Day of surgery medications reviewed with patient.  Reproductive/Obstetrics negative OB ROS                            Anesthesia Physical Anesthesia Plan  ASA: III  Anesthesia Plan: General   Post-op Pain Management:    Induction: Intravenous  PONV Risk Score and Plan: 4 or greater and Treatment may vary due to age or medical condition, Ondansetron, Dexamethasone and Midazolam  Airway Management Planned: Oral ETT  Additional Equipment: None  Intra-op Plan:   Post-operative Plan: Extubation in OR  Informed Consent: I have reviewed the patients History and Physical, chart, labs and discussed the procedure including the risks, benefits and alternatives for the proposed anesthesia with the patient or authorized representative who has  indicated his/her understanding and acceptance.     Dental advisory given  Plan Discussed with: CRNA  Anesthesia Plan Comments:        Anesthesia Quick Evaluation

## 2021-01-20 NOTE — Anesthesia Postprocedure Evaluation (Signed)
Anesthesia Post Note  Patient: Patricia Alvarado  Procedure(s) Performed: BREAST RECONSTRUCTION WITH PLACEMENT OF TISSUE EXPANDER AND FLEX HD (ACELLULAR HYDRATED DERMIS) (Right Breast)     Patient location during evaluation: PACU Anesthesia Type: General Level of consciousness: awake and alert and oriented Pain management: pain level controlled Vital Signs Assessment: post-procedure vital signs reviewed and stable Respiratory status: spontaneous breathing, nonlabored ventilation and respiratory function stable Cardiovascular status: blood pressure returned to baseline Postop Assessment: no apparent nausea or vomiting Anesthetic complications: no Comments: Patient very anxious in PACU. No pain or other symptoms. Given versed IV with improvement. Daiva Huge, MD   No complications documented.  Last Vitals:  Vitals:   01/20/21 1413 01/20/21 1428  BP: (!) 147/61 135/64  Pulse: 68 69  Resp: 13 14  Temp:    SpO2: 99% 97%    Last Pain:  Vitals:   01/20/21 1428  TempSrc:   PainSc: [place]

## 2021-01-20 NOTE — Progress Notes (Signed)
75 mcg Fentanyl wasted in stericycle with Orie Fisherman, RN.

## 2021-01-20 NOTE — Anesthesia Procedure Notes (Addendum)
Procedure Name: Intubation Date/Time: 01/20/2021 11:05 AM Performed by: Michele Rockers, CRNA Pre-anesthesia Checklist: Patient identified, Patient being monitored, Timeout performed, Emergency Drugs available and Suction available Patient Re-evaluated:Patient Re-evaluated prior to induction Oxygen Delivery Method: Circle system utilized Preoxygenation: Pre-oxygenation with 100% oxygen Induction Type: IV induction Ventilation: Mask ventilation without difficulty Laryngoscope Size: Mac and 3 Grade View: Grade I Tube type: Oral Tube size: 7.0 mm Number of attempts: 1 Airway Equipment and Method: Stylet Placement Confirmation: ETT inserted through vocal cords under direct vision,  positive ETCO2 and breath sounds checked- equal and bilateral Secured at: 21 cm Tube secured with: Tape Dental Injury: Teeth and Oropharynx as per pre-operative assessment  Comments: Placed by Joaquim Lai

## 2021-01-20 NOTE — Op Note (Signed)
Op report    DATE OF OPERATION:  01/20/2021  LOCATION: [facility] Operating room  SURGICAL DIVISION: Plastic Surgery  PREOPERATIVE DIAGNOSES:  1. Right Breast cancer.    POSTOPERATIVE DIAGNOSES:  1. Right  Breast cancer.   PROCEDURE:  1. Right breast reconstruction with placement of Acellular Dermal Matrix and tissue expanders.  SURGEON: Tamryn Popko Sanger Mahmud Keithly, DO  ASSISTANT: Roetta Sessions, PA  ANESTHESIA:  General.   COMPLICATIONS: None.   IMPLANTS: Right - 300 cc.   75 cc of injectable saline placed in the expander. Acellular Dermal Matrix 8 x 16 cm Flex HD  INDICATIONS FOR PROCEDURE:  The patient, Patricia Alvarado, is a 76 y.o. female born on 07-Sep-1946, is here for first stage breast reconstruction with placement of right tissue expander and Acellular dermal matrix. MRN: [id number]  CONSENT:  Informed consent was obtained directly from the patient. Risks, benefits and alternatives were fully discussed. Specific risks including but not limited to bleeding, infection, hematoma, seroma, scarring, pain, implant infection, implant extrusion, capsular contracture, asymmetry, wound healing problems, and need for further surgery were all discussed. The patient did have an ample opportunity to have her questions answered to her satisfaction.   DESCRIPTION OF PROCEDURE:  The patient was taken to the operating room.  SCDs were placed and IV antibiotics were given. The patient's chest was prepped and draped in a sterile fashion. A time out was performed and the implants to be used were identified.  The right chest was identified as the site for the operation.    Right: The previous incision site was injected with local.  The #15 blade was then used to make an incision.  The Bovie was used to dissect down to the pectoralis major muscle.  The skin flap was released from the superficial portion of the pectoralis major muscle.  This was done to help with expansion.  The pectoralis major  muscle was then lifted off of its previous capsule and released.  This was done with the Bovie.  Hemostasis was achieved with electrocautery.  The Flex HD 8 x 16 cm piece was prepared according to the manufacture guidelines.  Slits were placed to help with fluid removal.  The sheet was placed in the chest with the deep side or dermal side superficial. The ADM was then sutured to the inferior and lateral edge of the inframammary fold with 2-0 PDS starting with an interrupted stitch and then a running stitch.  The lateral portion was sutured to with interrupted sutures after the expander was placed.  The expander was prepared according to the manufacture guidelines, the air evacuated and then it was placed under the ADM and pectoralis major muscle.  The inferior and lateral tabs were used to secure the expander to the chest wall with 2-0 PDS.  The drain was placed at the inframammary fold over the ADM and secured to the skin with 3-0 Silk.    The deep layers were closed with 3-0 PDS followed by 4-0 Monocryl.  The skin was closed with 5-0 Monocryl and then dermabond was applied.  The ABDs and breast binder were placed.  The patient tolerated the procedure well and there were no complications.  The patient was allowed to wake from anesthesia and taken to the recovery room in satisfactory condition.   The advanced practice practitioner (APP) assisted throughout the case.  The APP was essential in retraction and counter traction when needed to make the case progress smoothly.  This retraction and assistance made  it possible to see the tissue plans for the procedure.  The assistance was needed for blood control, tissue re-approximation and assisted with closure of the incision site.

## 2021-01-21 ENCOUNTER — Other Ambulatory Visit: Payer: Self-pay | Admitting: Surgical

## 2021-01-21 ENCOUNTER — Other Ambulatory Visit (HOSPITAL_COMMUNITY): Payer: Self-pay | Admitting: Surgical

## 2021-01-21 ENCOUNTER — Telehealth: Payer: Self-pay

## 2021-01-21 MED ORDER — DIAZEPAM 2 MG PO TABS: 2.0000 mg | ORAL_TABLET | Freq: Two times a day (BID) | ORAL | 0 refills | Status: DC | PRN

## 2021-01-21 MED ORDER — CIPROFLOXACIN HCL 500 MG PO TABS: 500.0000 mg | ORAL_TABLET | Freq: Two times a day (BID) | ORAL | 0 refills | Status: AC

## 2021-01-21 NOTE — Telephone Encounter (Signed)
Prescription sent to pharmacy for muscle spasms

## 2021-01-21 NOTE — Progress Notes (Signed)
Postop Valium for muscle spasms related to expansion of tissue expander.

## 2021-01-21 NOTE — Addendum Note (Signed)
Addended byRoetta Sessions on: 01/21/2021 08:23 AM   Modules accepted: Orders

## 2021-01-21 NOTE — Telephone Encounter (Signed)
Matt corresponded with patient via [place].

## 2021-01-21 NOTE — Telephone Encounter (Signed)
Patient called to say that her face is flushed and she has redness on her upper chest and neck.  Patient said that she thinks it's from taking the Keflex.  Patient said that she took Tramadol at around 9:15am and she took the Keflex at 10:50am.  Patient also said that she took the Keflex three times yesterday.  Patient said that she has taken a 25mg  benadryl.  Please call

## 2021-01-22 ENCOUNTER — Encounter (HOSPITAL_COMMUNITY): Payer: Self-pay | Admitting: Plastic Surgery

## 2021-01-28 ENCOUNTER — Other Ambulatory Visit: Payer: Self-pay

## 2021-01-28 ENCOUNTER — Encounter: Payer: Self-pay | Admitting: Plastic Surgery

## 2021-01-28 ENCOUNTER — Ambulatory Visit (INDEPENDENT_AMBULATORY_CARE_PROVIDER_SITE_OTHER): Payer: PPO | Admitting: Plastic Surgery

## 2021-01-28 VITALS — BP 145/88 | HR 88

## 2021-01-28 DIAGNOSIS — C50912 Malignant neoplasm of unspecified site of left female breast: Secondary | ICD-10-CM | POA: Diagnosis not present

## 2021-01-28 DIAGNOSIS — C50911 Malignant neoplasm of unspecified site of right female breast: Secondary | ICD-10-CM | POA: Diagnosis not present

## 2021-01-28 DIAGNOSIS — Z9011 Acquired absence of right breast and nipple: Secondary | ICD-10-CM

## 2021-01-28 NOTE — Progress Notes (Signed)
   Subjective:    Patient ID: Patricia Alvarado, female    DOB: 1946/10/03, 75 y.o.   MRN: [id number]  The patient is old female follow-up after undergoing placement of a right sided breast expander 1 week ago.  Overall she is doing well.  She is got a little bit of urticaria around the torso.  This is most likely related to the prep.  No sign of a hematoma or seroma.  The drain seems to be working well.     Review of Systems  Constitutional: Negative.   HENT: Negative.   Eyes: Negative.   Respiratory: Negative.   Cardiovascular: Negative.   Gastrointestinal: Negative.   Genitourinary: Negative.        Objective:   Physical Exam Vitals and nursing note reviewed.  Constitutional:      Appearance: Normal appearance.  HENT:     Head: Normocephalic and atraumatic.  Cardiovascular:     Rate and Rhythm: Normal rate.     Pulses: Normal pulses.  Neurological:     Mental Status: She is alert. Mental status is at baseline.  Psychiatric:        Mood and Affect: Mood normal.        Behavior: Behavior normal.        Assessment & Plan:     ICD-10-CM   1. S/P mastectomy, right  Z90.11     Recommend a Benadryl at night and a Zyrtec in the morning.  Wash with Dial soap to try and remove the prep from the skin.  Continue with Valium as needed for the tightness of the muscle.  Also recommend a larger breast binder.  We placed injectable saline in the Expander using a sterile technique: Right: 50 cc for a total of 125 / 300 cc

## 2021-02-03 NOTE — Progress Notes (Signed)
Patient Care Team: Nicoletta Dress, MD as PCP - General (Internal Medicine) Evans Lance, MD as PCP - Electrophysiology (Cardiology) Nicholas Lose, MD as Medical Oncologist (Hematology and Oncology) Jovita Kussmaul, MD as Consulting Physician (General Surgery) Eppie Gibson, MD as Attending Physician (Radiation Oncology) Evans Lance, MD as Consulting Physician (Cardiology)  DIAGNOSIS:    ICD-10-CM   1. Malignant neoplasm of central portion of right breast in female, estrogen receptor positive ([place])  C50.111    Z17.0     SUMMARY OF ONCOLOGIC HISTORY: Oncology History  Malignant neoplasm of central portion of right breast in female, estrogen receptor positive ([place])  1992 Miscellaneous   Bilateral mastectomies   12/11/2018 Initial Diagnosis   Palpable right nipple mass, breast MRI revealed a 1.6 cm mass, internal mammary lymphadenopathy was noted, biopsy could not be performed because of the location.  Prior history of bilateral mastectomies for high risk disease.   02/05/2020 Surgery   Right central lumpectomy with implant Marlou Starks) [phone]): Grade 2 invasive lobular carcinoma, 2 cm, LCIS, lymphovascular invasion identified, perineural invasion identified. Less than 0.1 cm to the posterior margin. No regional lymph nodes were examined. ER 100%, PR 100%, HER-2 negative, Ki-67 1%   03/01/2020 Genetic Testing   Negative genetic testing:  No pathogenic variants detected on the [facility] Common Hereditary Cancers Panel. The report date is 03/01/2020.  The Common Hereditary Cancers Panel offered by [facility] includes sequencing and/or deletion duplication testing of the following 48 genes: APC, ATM, AXIN2, BARD1, BMPR1A, BRCA1, BRCA2, BRIP1, CDH1, CDK4, CDKN2A (p14ARF), CDKN2A (p16INK4a), CHEK2, CTNNA1, DICER1, EPCAM (Deletion/duplication testing only), GREM1 (promoter region deletion/duplication testing only), KIT, MEN1, MLH1, MSH2, MSH3, MSH6, MUTYH, NBN, NF1, NHTL1, PALB2, PDGFRA,  PMS2, POLD1, POLE, PTEN, RAD50, RAD51C, RAD51D, RNF43, SDHB, SDHC, SDHD, SMAD4, SMARCA4. STK11, TP53, TSC1, TSC2, and VHL.  The following genes were evaluated for sequence changes only: SDHA and HOXB13 c.251G>A variant only.    03/05/2020 Cancer Staging   Staging form: Breast, AJCC 8th Edition - Pathologic: Stage IA (pT2, pN0, cM0, G2, ER+, PR+, HER2-)   03/13/2020 - 04/23/2020 Radiation Therapy   The patient initially received a dose of 50 Gy in 25 fractions to the breast and supraclavicular region using whole-breast tangent fields. This was delivered using a 3-D conformal technique. The pt received a boost delivering an additional 10 Gy in 5 fractions using a electron boost with 58mV electrons. The total dose was 60 Gy.    Anti-estrogen oral therapy   Anastrozole recommended but she refused (fibromyalgia)     CHIEF COMPLIANT: Follow-up of right breast cancer on anastrozole   INTERVAL HISTORY: Patricia Wamseris a 75y.o. with above-mentioned history of rightbreast cancer treated with lumpectomy, radiation, and who is currently on antiestrogen therapy with anastrozole. She presents to the clinic today for follow-up.  Over the past year she has been having multiple procedures including recent implant-based reconstruction.  ALLERGIES:  is allergic to quinidine, dofetilide, vicodin  [hydrocodone-acetaminophen], amoxicillin, atenolol, atorvastatin, latex, and nadolol.  MEDICATIONS:  Current Outpatient Medications  Medication Sig Dispense Refill  . acetaminophen (TYLENOL) 500 MG tablet Take 500-1,000 mg by mouth every 6 (six) hours as needed for mild pain or moderate pain.    .Marland KitchenALPRAZolam (XANAX) 0.5 MG tablet Take 0.5 mg by mouth at bedtime.    .Marland Kitchenapixaban (ELIQUIS) 5 MG TABS tablet Take 1 tablet (5 mg total) by mouth 2 (two) times daily. 180 tablet 3  . Ascorbic Acid (VITAMIN C)  1000 MG tablet Take 1 tablet by mouth daily.    Marland Kitchen aspirin 81 MG EC tablet Take 81 mg by mouth daily.    . B  Complex-C (SUPER B COMPLEX PO) Take 1 capsule by mouth at bedtime.    . Biotin 5 MG CAPS Take 5 mg by mouth at bedtime.     . bisacodyl (DULCOLAX) 5 MG EC tablet Take 5 mg by mouth as needed for moderate constipation.    . Calcium 600-200 MG-UNIT tablet Take 2 tablets by mouth daily.    . cetirizine (ZYRTEC) 10 MG tablet Take 10 mg by mouth daily as needed for allergies.     . Coenzyme Q10 (COQ10) 100 MG CAPS Take 100 mg by mouth daily.    . diazepam (VALIUM) 2 MG tablet Take 1 tablet (2 mg total) by mouth every 12 (twelve) hours as needed for muscle spasms. 20 tablet 0  . docusate sodium (COLACE) 100 MG capsule Take 200 mg by mouth daily.    Marland Kitchen escitalopram (LEXAPRO) 20 MG tablet Take 20 mg by mouth daily.    . famotidine (PEPCID) 20 MG tablet Take 20 mg by mouth as needed for heartburn or indigestion.    . flecainide (TAMBOCOR) 100 MG tablet Take 1 tablet by mouth twice a day and may take an additional tablet daily for increased palpitations 225 tablet 3  . fluticasone (FLONASE) 50 MCG/ACT nasal spray as needed.    . furosemide (LASIX) 20 MG tablet Take 20 mg by mouth as needed for fluid.    . magnesium hydroxide (MILK OF MAGNESIA) 400 MG/5ML suspension Take 15 mLs by mouth daily as needed for mild constipation.    Marland Kitchen MAGNESIUM MALATE PO Take 1 tablet by mouth daily.    Marland Kitchen MANGANESE PO Take 40 mg by mouth at bedtime.    . Melatonin 10 MG TABS Take 10 mg by mouth at bedtime.     . Niacin, Antihyperlipidemic, 500 MG TABS Take 1,500 mg by mouth at bedtime.    . ondansetron (ZOFRAN) 4 MG tablet Take 1 tablet (4 mg total) by mouth every 8 (eight) hours as needed for nausea or vomiting. 20 tablet 0  . ranolazine (RANEXA) 500 MG 12 hr tablet Take 1 tablet (500 mg total) by mouth 2 (two) times daily. 180 tablet 3  . Simethicone 125 MG CAPS Take 2 tablets by mouth daily as needed (bloating).    . Thiamine Mononitrate (VITAMIN B1 PO) Take 1 tablet by mouth at bedtime. 100 mg    . thyroid (ARMOUR) 120 MG  tablet Take 120 mg by mouth daily before breakfast.    . VITAMIN D, CHOLECALCIFEROL, PO Take 5,000 Units by mouth daily.    . Zinc 50 MG TABS Take 1 tablet by mouth daily.     No current facility-administered medications for this visit.    PHYSICAL EXAMINATION: ECOG PERFORMANCE STATUS: 1 - Symptomatic but completely ambulatory  Vitals:   02/04/21 1303  BP: (!) 158/73  Pulse: 90  Resp: 16  Temp: (!) 97.3 F (36.3 C)  SpO2: 98%   Filed Weights   02/04/21 1303  Weight: 157 lb 9.6 oz (71.5 kg)      LABORATORY DATA:  I have reviewed the data as listed CMP Latest Ref Rng & Units 01/20/2021 08/06/2020 01/10/2020  Glucose 70 - 99 mg/dL 90 98 106(H)  BUN 8 - 23 mg/dL '12 13 11  ' Creatinine 0.44 - 1.00 mg/dL 0.76 0.77 0.79  Sodium 135 -  145 mmol/L 139 139 141  Potassium 3.5 - 5.1 mmol/L 3.8 4.4 4.5  Chloride 98 - 111 mmol/L 106 104 103  CO2 22 - 32 mmol/L '24 31 25  ' Calcium 8.9 - 10.3 mg/dL 9.5 9.8 10.0  Total Protein 6.5 - 8.1 g/dL - 6.9 -  Total Bilirubin 0.3 - 1.2 mg/dL - 0.3 -  Alkaline Phos 38 - 126 U/L - 82 -  AST 15 - 41 U/L - 18 -  ALT 0 - 44 U/L - 16 -    Lab Results  Component Value Date   WBC 4.1 01/20/2021   HGB 14.3 01/20/2021   HCT 41.8 01/20/2021   MCV 96.5 01/20/2021   PLT 170 01/20/2021   NEUTROABS 2.9 08/06/2020    ASSESSMENT & PLAN:  Malignant neoplasm of central portion of right breast in female, estrogen receptor positive ([place]) 02/05/2020: Right central lumpectomy with implant.  Grade 2 invasive lobular cancer 2 cm with LCIS and lymphovascular invasion and perineural invasion, ER 100%, PR 100%, HER-2 negative, Ki-67 1% [place] PET scan: No activity within the internal mammary lymph nodes or chest wall Adjuvant radiation therapy 03/13/2020-04/23/2020  Current treatment: Recommended anastrozole therapy but after much thought and consideration she decided not to take it because of her concern with worsening symptoms of fibromyalgia.    Osteopenia:  12/05/2019 T score -2.2: On [place]  Breast cancer surveillance: no role of imaging since she had bilateral mastectomies  Return to clinic on an as-needed basis    No orders of the defined types were placed in this encounter.  The patient has a good understanding of the overall plan. she agrees with it. she will call with any problems that may develop before the next visit here.  Total time spent: 20 mins including face to face time and time spent for planning, charting and coordination of care  Rulon Eisenmenger, MD, MPH 02/04/2021  I, Molly Dorshimer, am acting as scribe for Dr. Nicholas Lose.  I have reviewed the above documentation for accuracy and completeness, and I agree with the above.

## 2021-02-04 ENCOUNTER — Ambulatory Visit (INDEPENDENT_AMBULATORY_CARE_PROVIDER_SITE_OTHER): Payer: PPO | Admitting: Surgical

## 2021-02-04 ENCOUNTER — Other Ambulatory Visit: Payer: Self-pay

## 2021-02-04 ENCOUNTER — Inpatient Hospital Stay: Payer: PPO | Attending: Hematology and Oncology | Admitting: Hematology and Oncology

## 2021-02-04 ENCOUNTER — Other Ambulatory Visit: Payer: Self-pay | Admitting: Surgical

## 2021-02-04 DIAGNOSIS — Z79899 Other long term (current) drug therapy: Secondary | ICD-10-CM | POA: Diagnosis not present

## 2021-02-04 DIAGNOSIS — Z7982 Long term (current) use of aspirin: Secondary | ICD-10-CM | POA: Diagnosis not present

## 2021-02-04 DIAGNOSIS — Z9011 Acquired absence of right breast and nipple: Secondary | ICD-10-CM

## 2021-02-04 DIAGNOSIS — Z7901 Long term (current) use of anticoagulants: Secondary | ICD-10-CM | POA: Insufficient documentation

## 2021-02-04 DIAGNOSIS — M8580 Other specified disorders of bone density and structure, unspecified site: Secondary | ICD-10-CM | POA: Diagnosis not present

## 2021-02-04 DIAGNOSIS — Z853 Personal history of malignant neoplasm of breast: Secondary | ICD-10-CM | POA: Diagnosis not present

## 2021-02-04 DIAGNOSIS — Z923 Personal history of irradiation: Secondary | ICD-10-CM | POA: Diagnosis not present

## 2021-02-04 DIAGNOSIS — C50111 Malignant neoplasm of central portion of right female breast: Secondary | ICD-10-CM

## 2021-02-04 DIAGNOSIS — M797 Fibromyalgia: Secondary | ICD-10-CM | POA: Diagnosis not present

## 2021-02-04 DIAGNOSIS — Z170 Estrogen receptor positive status [ER+]: Secondary | ICD-10-CM | POA: Insufficient documentation

## 2021-02-04 DIAGNOSIS — Z9013 Acquired absence of bilateral breasts and nipples: Secondary | ICD-10-CM | POA: Diagnosis not present

## 2021-02-04 NOTE — Assessment & Plan Note (Addendum)
02/05/2020: Right central lumpectomy with implant.  Grade 2 invasive lobular cancer 2 cm with LCIS and lymphovascular invasion and perineural invasion, ER 100%, PR 100%, HER-2 negative, Ki-67 1% [place] PET scan: No activity within the internal mammary lymph nodes or chest wall Adjuvant radiation therapy 03/13/2020-04/23/2020  Current treatment: Antiestrogen therapy with anastrozole 1 mg daily x7 years started July 2021 Anastrozole toxicities:  Osteopenia: 12/05/2019 T score -2.2: On Boniva  Breast cancer surveillance: 1.  Breast exam 02/04/2021: Benign 2. Mammogram at [facility]: Needs a new mammogram ordered  Return to clinic in 1 year for follow-up

## 2021-02-04 NOTE — Progress Notes (Signed)
Patient is a 75 year old female here for follow-up on her right breast reconstruction.  She had placement of the tissue expander on 01/20/2021 with Dr. Marla Roe.  She reports overall she is doing well, tolerated last fill just fine.  She does have some discomfort but is otherwise doing okay.  No infectious symptoms.  Drain output has been approximately 5 cc per 24 hours  Chaperone on exam Right breast incision intact, right JP drain in place.  No cellulitic changes noted.  She does have some erythema of the upper pole of her right breast, this is likely radiation reaction.  There is no increased tenderness in this area.  No subcutaneous fluid collections noted with palpation.  We placed injectable saline in the Expander using a sterile technique: Right: 50 cc for a total of 175 / 300 cc  Recommend following up in 2 to 3 weeks for reevaluation and additional fill.  No sign of infection, seroma, hematoma.  Continue to avoid strenuous activities.  Continue to avoid heavy lifting

## 2021-02-05 MED ORDER — DIAZEPAM 2 MG PO TABS: 2.0000 mg | ORAL_TABLET | Freq: Two times a day (BID) | ORAL | 0 refills | Status: DC | PRN

## 2021-02-17 DIAGNOSIS — E039 Hypothyroidism, unspecified: Secondary | ICD-10-CM | POA: Diagnosis not present

## 2021-02-17 DIAGNOSIS — Z79899 Other long term (current) drug therapy: Secondary | ICD-10-CM | POA: Diagnosis not present

## 2021-02-17 DIAGNOSIS — E785 Hyperlipidemia, unspecified: Secondary | ICD-10-CM | POA: Diagnosis not present

## 2021-02-17 DIAGNOSIS — Z853 Personal history of malignant neoplasm of breast: Secondary | ICD-10-CM | POA: Diagnosis not present

## 2021-02-17 DIAGNOSIS — G4700 Insomnia, unspecified: Secondary | ICD-10-CM | POA: Diagnosis not present

## 2021-02-17 DIAGNOSIS — R7309 Other abnormal glucose: Secondary | ICD-10-CM | POA: Diagnosis not present

## 2021-02-17 DIAGNOSIS — I10 Essential (primary) hypertension: Secondary | ICD-10-CM | POA: Diagnosis not present

## 2021-02-17 DIAGNOSIS — I480 Paroxysmal atrial fibrillation: Secondary | ICD-10-CM | POA: Diagnosis not present

## 2021-02-17 DIAGNOSIS — F330 Major depressive disorder, recurrent, mild: Secondary | ICD-10-CM | POA: Diagnosis not present

## 2021-02-17 DIAGNOSIS — M797 Fibromyalgia: Secondary | ICD-10-CM | POA: Diagnosis not present

## 2021-02-17 DIAGNOSIS — R5381 Other malaise: Secondary | ICD-10-CM | POA: Diagnosis not present

## 2021-02-17 DIAGNOSIS — Z6826 Body mass index (BMI) 26.0-26.9, adult: Secondary | ICD-10-CM | POA: Diagnosis not present

## 2021-02-19 ENCOUNTER — Other Ambulatory Visit: Payer: Self-pay | Admitting: Surgical

## 2021-02-28 ENCOUNTER — Other Ambulatory Visit: Payer: Self-pay

## 2021-02-28 ENCOUNTER — Ambulatory Visit (INDEPENDENT_AMBULATORY_CARE_PROVIDER_SITE_OTHER): Payer: PPO | Admitting: Surgical

## 2021-02-28 DIAGNOSIS — Z170 Estrogen receptor positive status [ER+]: Secondary | ICD-10-CM

## 2021-02-28 DIAGNOSIS — Z9011 Acquired absence of right breast and nipple: Secondary | ICD-10-CM

## 2021-02-28 DIAGNOSIS — C50111 Malignant neoplasm of central portion of right female breast: Secondary | ICD-10-CM

## 2021-02-28 MED ORDER — CYCLOBENZAPRINE HCL 5 MG PO TABS: 5.0000 mg | ORAL_TABLET | Freq: Every day | ORAL | 0 refills | Status: DC | PRN

## 2021-02-28 NOTE — Progress Notes (Signed)
Patient is a 75 year old female here for follow-up on her right breast reconstruction.  She had placement of her tissue expander on 01/20/2021 with Dr. Marla Roe.  Of note she has a history of radiation to the right breast which ended in June 2021. She currently has a 350 cc saline implant in place in her left breast.  Patient reports overall she is doing well.  She reports some tenderness to her right breast after lifting her son's puppy which was approximately 20 pounds.  She reports otherwise she is doing well.  She has some questions about excision of her left nipple areola.  She also has some questions about muscle spasms after expansion.  She reports that she has used the Valium as needed, however she reports a more noticeable improvement with Flexeril.  She also has some questions about timeline for removal of the expander and placement of the implant.  Chaperone present on exam On exam right breast incision is intact and well-healed.  She does have some erythema of the right breast, appears consistent with some radiated tissue damage.  There is no cellulitic changes.  No excessive warmth.  Nontender to palpation.  No wounds are noted.  We placed injectable saline in the Expander using a sterile technique: Right: 50 cc for a total of 225 / 300 cc  Recommend following up in 2 to 3 weeks for reevaluation. No sign of infection, seroma, hematoma

## 2021-03-21 ENCOUNTER — Encounter: Payer: Self-pay | Admitting: Plastic Surgery

## 2021-03-21 ENCOUNTER — Other Ambulatory Visit: Payer: Self-pay

## 2021-03-21 ENCOUNTER — Ambulatory Visit (INDEPENDENT_AMBULATORY_CARE_PROVIDER_SITE_OTHER): Payer: PPO | Admitting: Plastic Surgery

## 2021-03-21 DIAGNOSIS — Z170 Estrogen receptor positive status [ER+]: Secondary | ICD-10-CM

## 2021-03-21 DIAGNOSIS — C50111 Malignant neoplasm of central portion of right female breast: Secondary | ICD-10-CM

## 2021-03-21 DIAGNOSIS — Z9011 Acquired absence of right breast and nipple: Secondary | ICD-10-CM

## 2021-03-21 NOTE — Progress Notes (Signed)
   Subjective:    Patient ID: Patricia Alvarado, female    DOB: [birth year], 75 y.o.   MRN: [id number]  The patient is a 75 year old female here for follow-up on her right breast reconstruction.  Her expander is in place.  And she is tolerating the expansion well considering her history of radiation.  She had bilateral mastectomies in 1992.  She was reconstructed with implants and then had a recurrence.  She had removal of her nipple areola by Dr. Marlou Starks.  She finished her radiation in June of last year.  The implant is 350 cc in the left breast and she still has her areola.  She is considering removal of her port on the left as well.  She would like to move towards exchange.     Review of Systems  Constitutional: Negative.  Negative for activity change.  Eyes: Negative.   Respiratory: Negative.   Cardiovascular: Negative.   Endocrine: Negative.   Genitourinary: Negative.   Musculoskeletal: Negative.   Neurological: Negative.   Hematological: Negative.   Psychiatric/Behavioral: Negative.        Objective:   Physical Exam Vitals and nursing note reviewed.  Constitutional:      Appearance: Normal appearance.  HENT:     Head: Normocephalic and atraumatic.  Cardiovascular:     Rate and Rhythm: Normal rate.     Pulses: Normal pulses.  Pulmonary:     Effort: Pulmonary effort is normal.  Abdominal:     General: Abdomen is flat.  Skin:    General: Skin is warm.     Capillary Refill: Capillary refill takes less than 2 seconds.  Neurological:     General: No focal deficit present.     Mental Status: She is alert and oriented to person, place, and time.  Psychiatric:        Mood and Affect: Mood normal.        Behavior: Behavior normal.       Assessment & Plan:     ICD-10-CM   1. S/P mastectomy, right  Z90.11   2. Malignant neoplasm of central portion of right breast in female, estrogen receptor positive ([place])  C50.111    Z17.0     I would like to set up a telemetry visit with  her to talk about the left breast.  The right side may be as large as it is going to get.  I would like to know if she wants to do the exchange of the right side at the same time as she has the areola removed from the left or she wants to do them separately.  We placed injectable saline in the Expander using a sterile technique: Right: 50 cc for a total of 275 / 300 cc  Pictures were obtained of the patient and placed in the chart with the patient's or guardian's permission.

## 2021-03-28 ENCOUNTER — Other Ambulatory Visit: Payer: Self-pay

## 2021-03-28 ENCOUNTER — Telehealth (INDEPENDENT_AMBULATORY_CARE_PROVIDER_SITE_OTHER): Payer: PPO | Admitting: Plastic Surgery

## 2021-03-28 DIAGNOSIS — C50111 Malignant neoplasm of central portion of right female breast: Secondary | ICD-10-CM

## 2021-03-28 DIAGNOSIS — Z9011 Acquired absence of right breast and nipple: Secondary | ICD-10-CM

## 2021-03-28 DIAGNOSIS — Z170 Estrogen receptor positive status [ER+]: Secondary | ICD-10-CM

## 2021-03-28 NOTE — Progress Notes (Signed)
   Subjective:    Patient ID: Patricia Alvarado, female    DOB: [birth year], 75 y.o.   MRN: [id number]  The patient is a 75 yrs old female joining me by perfect serve televisit.  The patient had bilateral mastectomies in 1992 for treatment of a right breast cancer.  Implants were placed and then she had a recurrence recently and underwent further resection with excision of the right nipple areola by Dr. Marlou Starks.  She had radiation until June of last year.  Her expander is in place on the right side.  It has 275 cc of saline and 300 cc expander.  She has a saline implant on the left side and it has 350 cc of saline and.  She still has her nipple areola on the left.  She is 5 feet 5 inches tall and weighs 159 pounds.  She would like the left nipple areola resected and go with a smaller implant to match the right side.  Her oncologist is Dr. Payton Mccallum who has signed off according to the patient.   Review of Systems  Constitutional: Negative.   HENT: Negative.   Eyes: Negative.   Respiratory: Negative.   Cardiovascular: Negative.   Endocrine: Negative.   Genitourinary: Negative.   Musculoskeletal: Negative.        Objective:   Physical Exam  Televisit.     Assessment & Plan:     ICD-10-CM   1. S/P mastectomy, right  Z90.11   2. Malignant neoplasm of central portion of right breast in female, estrogen receptor positive ([place])  C50.111    Z17.0   3. Acquired absence of right breast  Z90.11      The patient gave consent to have this visit done by telemedicine / virtual visit.  This is also consent for access the chart and treat the patient via this visit. The patient is located at home.  I, the provider, am at the office.  We spent 5 minutes together for the visit.  I spent an additional 10 minutes reviewing her chart and contacting Dr. Marlou Starks.  We will try to coordinate with Dr. Marlou Starks to do everything at one time.  This would be exchange of the right expander for implant and excision of left nipple  areola with smaller implant placement.

## 2021-04-03 ENCOUNTER — Encounter: Payer: Self-pay | Admitting: Plastic Surgery

## 2021-04-04 ENCOUNTER — Ambulatory Visit: Payer: PPO | Admitting: Surgical

## 2021-04-09 DIAGNOSIS — Z1331 Encounter for screening for depression: Secondary | ICD-10-CM | POA: Diagnosis not present

## 2021-04-09 DIAGNOSIS — E785 Hyperlipidemia, unspecified: Secondary | ICD-10-CM | POA: Diagnosis not present

## 2021-04-09 DIAGNOSIS — Z0000 Encounter for general adult medical examination without abnormal findings: Secondary | ICD-10-CM | POA: Diagnosis not present

## 2021-04-09 DIAGNOSIS — Z9181 History of falling: Secondary | ICD-10-CM | POA: Diagnosis not present

## 2021-04-11 ENCOUNTER — Encounter: Payer: Self-pay | Admitting: Surgical

## 2021-04-11 ENCOUNTER — Other Ambulatory Visit: Payer: Self-pay

## 2021-04-11 ENCOUNTER — Ambulatory Visit (INDEPENDENT_AMBULATORY_CARE_PROVIDER_SITE_OTHER): Payer: PPO | Admitting: Surgical

## 2021-04-11 VITALS — BP 152/79 | HR 75 | Ht 64.0 in | Wt 150.0 lb

## 2021-04-11 DIAGNOSIS — I480 Paroxysmal atrial fibrillation: Secondary | ICD-10-CM

## 2021-04-11 DIAGNOSIS — C50111 Malignant neoplasm of central portion of right female breast: Secondary | ICD-10-CM

## 2021-04-11 DIAGNOSIS — Z9011 Acquired absence of right breast and nipple: Secondary | ICD-10-CM

## 2021-04-11 DIAGNOSIS — Z170 Estrogen receptor positive status [ER+]: Secondary | ICD-10-CM

## 2021-04-11 NOTE — Progress Notes (Signed)
Patient ID: Patricia Alvarado, female    DOB: 03-07-1946, 75 y.o.   MRN: [id number]  Chief Complaint  Patient presents with   Pre-op Exam       ICD-10-CM   1. S/P mastectomy, right  Z90.11     2. Malignant neoplasm of central portion of right breast in female, estrogen receptor positive ([place])  C50.111    Z17.0     3. Acquired absence of right breast  Z90.11     4. Paroxysmal atrial fibrillation (HCC)  I48.0        History of Present Illness: Patricia Alvarado is a 75 y.o.  female  She presents for preoperative evaluation for upcoming procedure, removal of right tissue expander and placement of right breast implant and exchange of left breast implant, scheduled for 05/05/2021 with Dr. Marla Roe.  The patient has not had problems with anesthesia. No history of DVT/PE.  No family history of DVT/PE.  No family or personal history of bleeding or clotting disorders.  Patient is not currently taking any blood thinners.  No history of CVA/MI.   Summary of Previous Visit: History of bilateral mastectomies in 1992 for right-sided breast cancer.  She underwent reconstruction with implants.  She had recurrence on the right breast with further excision and then subsequent radiation which ended in June 2021.  She has a 350 cc saline implant in the left breast.  She subsequently underwent right breast reconstruction with placement of tissue expander and Flex HD with Dr. Marla Roe on 01/20/2021.  **Patient is interested in having the left nipple areola complex resected to match the right breast.  She has a history of radiation which ended in June of last year.  PMH Significant for: Dyslipidemia, fibromyalgia, mitral valve prolapse, A. fib, postoperative nausea vomiting, history of V. tach.  Patient is currently on aspirin 81 mg daily as well as Eliquis.  Patient was previously cleared by cardiology for her initial surgery with our office on 01/20/2021.  Patient received clearance on 01/06/2021.  They  noted patient can stop ASA 81 mg 5 days prior to surgery and hold Eliquis 2 days prior to surgery.  Patient reports today that she has not had any changes since that visit.  She reports no chest pain, palpitations, dyspnea, orthopnea, nausea, vomiting, dizziness, syncope.  She currently has 325 cc of saline in her 300 cc expander.  We placed 50 cc of saline in her expander today. She currently has a saline implant on the left side and has 350 cc of saline.    Past Medical History: Allergies: Allergies  Allergen Reactions   Quinidine Other (See Comments)    REACTION: increased heart rate   Dofetilide Other (See Comments)    Elevated QTCs   Vicodin  [Hydrocodone-Acetaminophen]    Amoxicillin Itching and Rash   Atenolol Hives   Atorvastatin Other (See Comments)    REACTION: muscles pain   Latex Other (See Comments)    Raw, rash and blisters - Tape only   Nadolol Hives    Current Medications:  Current Outpatient Medications:    acetaminophen (TYLENOL) 500 MG tablet, Take 500-1,000 mg by mouth every 6 (six) hours as needed for mild pain or moderate pain., Disp: , Rfl:    ALPRAZolam (XANAX) 0.5 MG tablet, Take 0.5 mg by mouth at bedtime., Disp: , Rfl:    apixaban (ELIQUIS) 5 MG TABS tablet, Take 1 tablet (5 mg total) by mouth 2 (two) times daily., Disp: 180 tablet,  Rfl: 3   Ascorbic Acid (VITAMIN C) 1000 MG tablet, Take 1 tablet by mouth daily., Disp: , Rfl:    aspirin 81 MG EC tablet, Take 81 mg by mouth daily., Disp: , Rfl:    B Complex-C (SUPER B COMPLEX PO), Take 1 capsule by mouth at bedtime., Disp: , Rfl:    Biotin 5 MG CAPS, Take 5 mg by mouth at bedtime. , Disp: , Rfl:    bisacodyl (DULCOLAX) 5 MG EC tablet, Take 5 mg by mouth as needed for moderate constipation., Disp: , Rfl:    Calcium 600-200 MG-UNIT tablet, Take 2 tablets by mouth daily., Disp: , Rfl:    cetirizine (ZYRTEC) 10 MG tablet, Take 10 mg by mouth daily as needed for allergies. , Disp: , Rfl:    Coenzyme Q10  (COQ10) 100 MG CAPS, Take 100 mg by mouth daily., Disp: , Rfl:    cyclobenzaprine (FLEXERIL) 5 MG tablet, Take 1 tablet (5 mg total) by mouth daily as needed for up to 20 doses for muscle spasms., Disp: 20 tablet, Rfl: 0   diazepam (VALIUM) 2 MG tablet, Take 1 tablet (2 mg total) by mouth every 12 (twelve) hours as needed for muscle spasms., Disp: 20 tablet, Rfl: 0   docusate sodium (COLACE) 100 MG capsule, Take 200 mg by mouth daily., Disp: , Rfl:    escitalopram (LEXAPRO) 20 MG tablet, Take 20 mg by mouth daily., Disp: , Rfl:    famotidine (PEPCID) 20 MG tablet, Take 20 mg by mouth as needed for heartburn or indigestion., Disp: , Rfl:    flecainide (TAMBOCOR) 100 MG tablet, Take 1 tablet by mouth twice a day and may take an additional tablet daily for increased palpitations, Disp: 225 tablet, Rfl: 3   fluticasone (FLONASE) 50 MCG/ACT nasal spray, as needed., Disp: , Rfl:    furosemide (LASIX) 20 MG tablet, Take 20 mg by mouth as needed for fluid., Disp: , Rfl:    magnesium hydroxide (MILK OF MAGNESIA) 400 MG/5ML suspension, Take 15 mLs by mouth daily as needed for mild constipation., Disp: , Rfl:    MAGNESIUM MALATE PO, Take 1 tablet by mouth daily., Disp: , Rfl:    MANGANESE PO, Take 40 mg by mouth at bedtime., Disp: , Rfl:    Melatonin 10 MG TABS, Take 10 mg by mouth at bedtime. , Disp: , Rfl:    Niacin, Antihyperlipidemic, 500 MG TABS, Take 1,500 mg by mouth at bedtime., Disp: , Rfl:    ondansetron (ZOFRAN) 4 MG tablet, Take 1 tablet (4 mg total) by mouth every 8 (eight) hours as needed for nausea or vomiting., Disp: 20 tablet, Rfl: 0   ranolazine (RANEXA) 500 MG 12 hr tablet, Take 1 tablet (500 mg total) by mouth 2 (two) times daily., Disp: 180 tablet, Rfl: 3   Simethicone 125 MG CAPS, Take 2 tablets by mouth daily as needed (bloating)., Disp: , Rfl:    Thiamine Mononitrate (VITAMIN B1 PO), Take 1 tablet by mouth at bedtime. 100 mg, Disp: , Rfl:    thyroid (ARMOUR) 120 MG tablet, Take 120 mg  by mouth daily before breakfast., Disp: , Rfl:    VITAMIN D, CHOLECALCIFEROL, PO, Take 5,000 Units by mouth daily., Disp: , Rfl:    Zinc 50 MG TABS, Take 1 tablet by mouth daily., Disp: , Rfl:    diazepam (VALIUM) 2 MG tablet, Take 1 tablet (2 mg total) by mouth every 12 (twelve) hours as needed for muscle spasms., Disp: 20 tablet,  Rfl: 0  Past Medical Problems: Past Medical History:  Diagnosis Date   A-fib ([facility])    Arthritis    thumb   Basal cell carcinoma 2001   "forehead, between eyebrows" right arm (2022)   Breast cancer ([facility])    Carcinoma of thyroid gland ([facility]) 2001   Chronic lower back pain    "worse is across my hips" (05/26/2016)   Depression    Dyslipidemia    Family history of breast cancer    Family history of kidney cancer    Family history of leukemia    Family history of nonmelanoma skin cancer    Fibrocystic breast    Fibromyalgia    GERD (gastroesophageal reflux disease)    Heart murmur    History of blood transfusion 1992   "after subcutaneous mastectomies"   Hypertension    not on any medications   Hypothyroidism    Malignant melanoma of left ankle ([facility]) 2001   Migraine    "visual; 2-3 times/year" (05/26/2016)   Mitral valve prolapse    PONV (postoperative nausea and vomiting)    Ventricular tachycardia (HCC)    Hx of, controlled on sotalol therapy    Past Surgical History: Past Surgical History:  Procedure Laterality Date   ABDOMINAL HYSTERECTOMY  1998   ANTERIOR CERVICAL DECOMP/DISCECTOMY FUSION  2001   BACK SURGERY     BASAL CELL CARCINOMA EXCISION  2001   "cut it out & did a flap, on forehead between my eyebrows"   BREAST IMPLANT EXCHANGE Bilateral 2001   [id number]   BREAST IMPLANT REMOVAL Right 02/05/2020   Procedure: REMOVAL RIGHT BREAST IMPLANT AND CAPSULECTOMY;  Surgeon: Jovita Kussmaul, MD;  Location: [facility];  Service: General;  Laterality: Right;   BREAST LUMPECTOMY Right 02/05/2020   Procedure: RIGHT BREAST CENTRAL  LUMPECTOMY;  Surgeon: Jovita Kussmaul, MD;  Location: [facility];  Service: General;  Laterality: Right;   BREAST RECONSTRUCTION WITH PLACEMENT OF TISSUE EXPANDER AND FLEX HD (ACELLULAR HYDRATED DERMIS) Right 01/20/2021   Procedure: BREAST RECONSTRUCTION WITH PLACEMENT OF TISSUE EXPANDER AND FLEX HD (ACELLULAR HYDRATED DERMIS);  Surgeon: Wallace Going, DO;  Location: [facility];  Service: Plastics;  Laterality: Right;  2 hours   CARPAL TUNNEL RELEASE Right 2006   COLONOSCOPY     DILATION AND CURETTAGE OF UTERUS  1970s X 2-3   ELECTROPHYSIOLOGIC STUDY  1994 X 2;2001   "to see it it was sustained VT; cause thyroid levels were causing arrhythmias"   LAPAROSCOPIC CHOLECYSTECTOMY     MASTECTOMY Bilateral 1992   "subcutaneous"   MELANOMA EXCISION Left 2001   "ankle, stage I"   PLACEMENT OF BREAST IMPLANTS Bilateral 1992   [id number]   TOTAL THYROIDECTOMY  12/1999   "cancer"   VENTRICULAR ABLATION SURGERY  2011   ventricular tchycardia    Social History: Social History   Socioeconomic History   Marital status: Married    Spouse name: Not on file   Number of children: Not on file   Years of education: Not on file   Highest education level: Not on file  Occupational History   Occupation: Financial controller GI    Employer: [employer]  Tobacco Use   Smoking status: Never   Smokeless tobacco: Never  Vaping Use   Vaping Use: Never used  Substance and Sexual Activity   Alcohol use: No   Drug use: No   Sexual activity: Yes  Other Topics Concern   Not on file  Social History Narrative   Married   Investment banker, operational of Radio broadcast assistant Strain: Not on file  Food Insecurity: Not on file  Transportation Needs: Not on file  Physical Activity: Not on file  Stress: Not on file  Social Connections: Not on file  Intimate Partner Violence: Not on file    Family History: Family History  Problem Relation Age of Onset   Other Brother        AGENT ORANGE and AntiLupus    Heart disease Brother        Stents and bypass x2   Arrhythmia Brother        AFIB   Heart attack Brother    Hypertension Brother    Squamous cell carcinoma Brother 27       Skin   Hyperlipidemia Mother 45   Osteoporosis Mother    Basal cell carcinoma Mother 65   Heart disease Father 53   Other Father        Cardiac arrest   Breast cancer Maternal Grandmother 85       metastatic   Breast cancer Other        dx. unknown age; maternal great-aunt   Lung cancer Maternal Aunt 75       hx. of smoking   Other Paternal Aunt        bilateral mastectomies due to multiple breast lumps   Leukemia Maternal Aunt    Cancer Maternal Aunt        unknown types   Thyroid cancer Neg Hx     Review of Systems: Review of Systems  Constitutional: Negative.   Respiratory: Negative.    Cardiovascular: Negative.   Gastrointestinal: Negative.   Neurological: Negative.    Physical Exam: Vital Signs BP (!) 152/79 (BP Location: Left Arm, Patient Position: Sitting, Cuff Size: Normal)   Pulse 75   Ht 5\' 4"  (1.626 m)   Wt 150 lb (68 kg)   SpO2 96%   BMI 25.75 kg/m   Physical Exam Constitutional:      General: Not in acute distress.    Appearance: Normal appearance. Not ill-appearing.  HENT:     Head: Normocephalic and atraumatic.  Eyes:     Pupils: Pupils are equal, round Neck:     Musculoskeletal: Normal range of motion.  Cardiovascular:     Rate and Rhythm: Normal rate    Pulses: Normal pulses.  Breast: Bilateral breast incisions are intact and well-healed.  No erythema or cellulitic changes noted. Pulmonary:     Effort: Pulmonary effort is normal. No respiratory distress.  Abdominal:     General: Abdomen is flat. There is no distension.  Musculoskeletal: Normal range of motion.  Skin:    General: Skin is warm and dry.     Findings: No erythema or rash.  Neurological:     General: No focal deficit present.     Mental Status: Alert and oriented to person, place, and time.  Mental status is at baseline.     Motor: No weakness.  Psychiatric:        Mood and Affect: Mood normal.        Behavior: Behavior normal.    Assessment/Plan: The patient is scheduled for exchange of right breast tissue expander for right breast implant and exchange of left breast implant for new implant with Dr. Marla Roe.  Risks, benefits, and alternatives of procedure discussed, questions answered and consent obtained.    **Patient reported today that she would like silicone  implants**.  She had previously noted that she would like saline based on previous EMR notes.  I have notified surgical scheduling team.  Smoking Status: Non-smoker; Counseling Given?  N/A Last Mammogram: History of bilateral mastectomies  Caprini Score: 8, high; Risk Factors include: Age, history of breast cancer, swollen legs, BMI rater than 25, and length of planned surgery. Recommendation for mechanical and pharmacological prophylaxis. Encourage early ambulation.  Patient to restart Eliquis postoperatively.  Pictures obtained: 03/21/2021  Post-op Rx sent to pharmacy: Will send tramadol, Zofran, Cipro once surgical date has been confirmed given that date may need to be changed due to coordination with general surgery for resection of left nipple areolar complex.  Patient was provided with the General Surgical Risk consent document and Pain Medication Agreement prior to their appointment.  They had adequate time to read through the risk consent documents and Pain Medication Agreement. We also discussed them in person together during this preop appointment. All of their questions were answered to their satisfaction.  Recommended calling if they have any further questions.  Risk consent form and Pain Medication Agreement to be scanned into patient's chart.  Patient was provided with the [employer] implant patient decision checklist and this was completed during today's preoperative evaluation. Patient had time to read  through the information and any questions were answered to their content. Form will be scanned into patient's chart.  Patient is interested in resection of her left nipple areolar complex to match the right breast.  Our surgical scheduling team has discussed with the general surgery team plans for this and at this time the general surgeon is currently out of town for the scheduled date on 05/05/2021.  I discussed with the patient today that if she is interested in resection of the left nipple areolar complex that we would need to reschedule this for a later date.  We placed injectable saline in the Expander using a sterile technique: Right: 50 cc for a total of 325 / 300 cc   Electronically signed by: Carola Rhine Naylee Frankowski, PA-C 04/11/2021 12:06 PM

## 2021-04-11 NOTE — H&P (View-Only) (Signed)
Patient ID: Patricia Alvarado, female    DOB: [birth year], 75 y.o.   MRN: [id number]  Chief Complaint  Patient presents with   Pre-op Exam       ICD-10-CM   1. S/P mastectomy, right  Z90.11     2. Malignant neoplasm of central portion of right breast in female, estrogen receptor positive ([place])  C50.111    Z17.0     3. Acquired absence of right breast  Z90.11     4. Paroxysmal atrial fibrillation (HCC)  I48.0        History of Present Illness: Patricia Alvarado is a 75 y.o.  female  She presents for preoperative evaluation for upcoming procedure, removal of right tissue expander and placement of right breast implant and exchange of left breast implant, scheduled for 05/05/2021 with Dr. Marla Roe.  The patient has not had problems with anesthesia. No history of DVT/PE.  No family history of DVT/PE.  No family or personal history of bleeding or clotting disorders.  Patient is not currently taking any blood thinners.  No history of CVA/MI.   Summary of Previous Visit: History of bilateral mastectomies in 1992 for right-sided breast cancer.  She underwent reconstruction with implants.  She had recurrence on the right breast with further excision and then subsequent radiation which ended in June 2021.  She has a 350 cc saline implant in the left breast.  She subsequently underwent right breast reconstruction with placement of tissue expander and Flex HD with Dr. Marla Roe on 01/20/2021.  **Patient is interested in having the left nipple areola complex resected to match the right breast.  She has a history of radiation which ended in June of last year.  PMH Significant for: Dyslipidemia, fibromyalgia, mitral valve prolapse, A. fib, postoperative nausea vomiting, history of V. tach.  Patient is currently on aspirin 81 mg daily as well as Eliquis.  Patient was previously cleared by cardiology for her initial surgery with our office on 01/20/2021.  Patient received clearance on 01/06/2021.  They  noted patient can stop ASA 81 mg 5 days prior to surgery and hold Eliquis 2 days prior to surgery.  Patient reports today that she has not had any changes since that visit.  She reports no chest pain, palpitations, dyspnea, orthopnea, nausea, vomiting, dizziness, syncope.  She currently has 325 cc of saline in her 300 cc expander.  We placed 50 cc of saline in her expander today. She currently has a saline implant on the left side and has 350 cc of saline.    Past Medical History: Allergies: Allergies  Allergen Reactions   Quinidine Other (See Comments)    REACTION: increased heart rate   Dofetilide Other (See Comments)    Elevated QTCs   Vicodin  [Hydrocodone-Acetaminophen]    Amoxicillin Itching and Rash   Atenolol Hives   Atorvastatin Other (See Comments)    REACTION: muscles pain   Latex Other (See Comments)    Raw, rash and blisters - Tape only   Nadolol Hives    Current Medications:  Current Outpatient Medications:    acetaminophen (TYLENOL) 500 MG tablet, Take 500-1,000 mg by mouth every 6 (six) hours as needed for mild pain or moderate pain., Disp: , Rfl:    ALPRAZolam (XANAX) 0.5 MG tablet, Take 0.5 mg by mouth at bedtime., Disp: , Rfl:    apixaban (ELIQUIS) 5 MG TABS tablet, Take 1 tablet (5 mg total) by mouth 2 (two) times daily., Disp: 180 tablet,  Rfl: 3   Ascorbic Acid (VITAMIN C) 1000 MG tablet, Take 1 tablet by mouth daily., Disp: , Rfl:    aspirin 81 MG EC tablet, Take 81 mg by mouth daily., Disp: , Rfl:    B Complex-C (SUPER B COMPLEX PO), Take 1 capsule by mouth at bedtime., Disp: , Rfl:    Biotin 5 MG CAPS, Take 5 mg by mouth at bedtime. , Disp: , Rfl:    bisacodyl (DULCOLAX) 5 MG EC tablet, Take 5 mg by mouth as needed for moderate constipation., Disp: , Rfl:    Calcium 600-200 MG-UNIT tablet, Take 2 tablets by mouth daily., Disp: , Rfl:    cetirizine (ZYRTEC) 10 MG tablet, Take 10 mg by mouth daily as needed for allergies. , Disp: , Rfl:    Coenzyme Q10  (COQ10) 100 MG CAPS, Take 100 mg by mouth daily., Disp: , Rfl:    cyclobenzaprine (FLEXERIL) 5 MG tablet, Take 1 tablet (5 mg total) by mouth daily as needed for up to 20 doses for muscle spasms., Disp: 20 tablet, Rfl: 0   diazepam (VALIUM) 2 MG tablet, Take 1 tablet (2 mg total) by mouth every 12 (twelve) hours as needed for muscle spasms., Disp: 20 tablet, Rfl: 0   docusate sodium (COLACE) 100 MG capsule, Take 200 mg by mouth daily., Disp: , Rfl:    escitalopram (LEXAPRO) 20 MG tablet, Take 20 mg by mouth daily., Disp: , Rfl:    famotidine (PEPCID) 20 MG tablet, Take 20 mg by mouth as needed for heartburn or indigestion., Disp: , Rfl:    flecainide (TAMBOCOR) 100 MG tablet, Take 1 tablet by mouth twice a day and may take an additional tablet daily for increased palpitations, Disp: 225 tablet, Rfl: 3   fluticasone (FLONASE) 50 MCG/ACT nasal spray, as needed., Disp: , Rfl:    furosemide (LASIX) 20 MG tablet, Take 20 mg by mouth as needed for fluid., Disp: , Rfl:    magnesium hydroxide (MILK OF MAGNESIA) 400 MG/5ML suspension, Take 15 mLs by mouth daily as needed for mild constipation., Disp: , Rfl:    MAGNESIUM MALATE PO, Take 1 tablet by mouth daily., Disp: , Rfl:    MANGANESE PO, Take 40 mg by mouth at bedtime., Disp: , Rfl:    Melatonin 10 MG TABS, Take 10 mg by mouth at bedtime. , Disp: , Rfl:    Niacin, Antihyperlipidemic, 500 MG TABS, Take 1,500 mg by mouth at bedtime., Disp: , Rfl:    ondansetron (ZOFRAN) 4 MG tablet, Take 1 tablet (4 mg total) by mouth every 8 (eight) hours as needed for nausea or vomiting., Disp: 20 tablet, Rfl: 0   ranolazine (RANEXA) 500 MG 12 hr tablet, Take 1 tablet (500 mg total) by mouth 2 (two) times daily., Disp: 180 tablet, Rfl: 3   Simethicone 125 MG CAPS, Take 2 tablets by mouth daily as needed (bloating)., Disp: , Rfl:    Thiamine Mononitrate (VITAMIN B1 PO), Take 1 tablet by mouth at bedtime. 100 mg, Disp: , Rfl:    thyroid (ARMOUR) 120 MG tablet, Take 120 mg  by mouth daily before breakfast., Disp: , Rfl:    VITAMIN D, CHOLECALCIFEROL, PO, Take 5,000 Units by mouth daily., Disp: , Rfl:    Zinc 50 MG TABS, Take 1 tablet by mouth daily., Disp: , Rfl:    diazepam (VALIUM) 2 MG tablet, Take 1 tablet (2 mg total) by mouth every 12 (twelve) hours as needed for muscle spasms., Disp: 20 tablet,  Rfl: 0  Past Medical Problems: Past Medical History:  Diagnosis Date   A-fib ([place])    Arthritis    thumb   Basal cell carcinoma 2001   "forehead, between eyebrows" right arm (2022)   Breast cancer ([place])    Carcinoma of thyroid gland ([place]) 2001   Chronic lower back pain    "worse is across my hips" (05/26/2016)   Depression    Dyslipidemia    Family history of breast cancer    Family history of kidney cancer    Family history of leukemia    Family history of nonmelanoma skin cancer    Fibrocystic breast    Fibromyalgia    GERD (gastroesophageal reflux disease)    Heart murmur    History of blood transfusion 1992   "after subcutaneous mastectomies"   Hypertension    not on any medications   Hypothyroidism    Malignant melanoma of left ankle ([place]) 2001   Migraine    "visual; 2-3 times/year" (05/26/2016)   Mitral valve prolapse    PONV (postoperative nausea and vomiting)    Ventricular tachycardia (HCC)    Hx of, controlled on sotalol therapy    Past Surgical History: Past Surgical History:  Procedure Laterality Date   ABDOMINAL HYSTERECTOMY  1998   ANTERIOR CERVICAL DECOMP/DISCECTOMY FUSION  2001   BACK SURGERY     BASAL CELL CARCINOMA EXCISION  2001   "cut it out & did a flap, on forehead between my eyebrows"   BREAST IMPLANT EXCHANGE Bilateral 2001   [id number]   BREAST IMPLANT REMOVAL Right 02/05/2020   Procedure: REMOVAL RIGHT BREAST IMPLANT AND CAPSULECTOMY;  Surgeon: Jovita Kussmaul, MD;  Location: [facility];  Service: General;  Laterality: Right;   BREAST LUMPECTOMY Right 02/05/2020   Procedure: RIGHT BREAST CENTRAL  LUMPECTOMY;  Surgeon: Jovita Kussmaul, MD;  Location: [facility];  Service: General;  Laterality: Right;   BREAST RECONSTRUCTION WITH PLACEMENT OF TISSUE EXPANDER AND FLEX HD (ACELLULAR HYDRATED DERMIS) Right 01/20/2021   Procedure: BREAST RECONSTRUCTION WITH PLACEMENT OF TISSUE EXPANDER AND FLEX HD (ACELLULAR HYDRATED DERMIS);  Surgeon: Wallace Going, DO;  Location: [facility];  Service: Plastics;  Laterality: Right;  2 hours   CARPAL TUNNEL RELEASE Right 2006   COLONOSCOPY     DILATION AND CURETTAGE OF UTERUS  1970s X 2-3   ELECTROPHYSIOLOGIC STUDY  1994 X 2;2001   "to see it it was sustained VT; cause thyroid levels were causing arrhythmias"   LAPAROSCOPIC CHOLECYSTECTOMY     MASTECTOMY Bilateral 1992   "subcutaneous"   MELANOMA EXCISION Left 2001   "ankle, stage I"   PLACEMENT OF BREAST IMPLANTS Bilateral 1992   [id number]   TOTAL THYROIDECTOMY  12/1999   "cancer"   VENTRICULAR ABLATION SURGERY  2011   ventricular tchycardia    Social History: Social History   Socioeconomic History   Marital status: Married    Spouse name: Not on file   Number of children: Not on file   Years of education: Not on file   Highest education level: Not on file  Occupational History   Occupation: Financial controller GI    Employer: [employer]  Tobacco Use   Smoking status: Never   Smokeless tobacco: Never  Vaping Use   Vaping Use: Never used  Substance and Sexual Activity   Alcohol use: No   Drug use: No   Sexual activity: Yes  Other Topics Concern   Not on file  Social History Narrative   Married   Investment banker, operational of Radio broadcast assistant Strain: Not on file  Food Insecurity: Not on file  Transportation Needs: Not on file  Physical Activity: Not on file  Stress: Not on file  Social Connections: Not on file  Intimate Partner Violence: Not on file    Family History: Family History  Problem Relation Age of Onset   Other Brother        AGENT ORANGE and AntiLupus    Heart disease Brother        Stents and bypass x2   Arrhythmia Brother        AFIB   Heart attack Brother    Hypertension Brother    Squamous cell carcinoma Brother 28       Skin   Hyperlipidemia Mother 66   Osteoporosis Mother    Basal cell carcinoma Mother 45   Heart disease Father 74   Other Father        Cardiac arrest   Breast cancer Maternal Grandmother 85       metastatic   Breast cancer Other        dx. unknown age; maternal great-aunt   Lung cancer Maternal Aunt 75       hx. of smoking   Other Paternal Aunt        bilateral mastectomies due to multiple breast lumps   Leukemia Maternal Aunt    Cancer Maternal Aunt        unknown types   Thyroid cancer Neg Hx     Review of Systems: Review of Systems  Constitutional: Negative.   Respiratory: Negative.    Cardiovascular: Negative.   Gastrointestinal: Negative.   Neurological: Negative.    Physical Exam: Vital Signs BP (!) 152/79 (BP Location: Left Arm, Patient Position: Sitting, Cuff Size: Normal)   Pulse 75   Ht 5\' 4"  (1.626 m)   Wt 150 lb (68 kg)   SpO2 96%   BMI 25.75 kg/m   Physical Exam Constitutional:      General: Not in acute distress.    Appearance: Normal appearance. Not ill-appearing.  HENT:     Head: Normocephalic and atraumatic.  Eyes:     Pupils: Pupils are equal, round Neck:     Musculoskeletal: Normal range of motion.  Cardiovascular:     Rate and Rhythm: Normal rate    Pulses: Normal pulses.  Breast: Bilateral breast incisions are intact and well-healed.  No erythema or cellulitic changes noted. Pulmonary:     Effort: Pulmonary effort is normal. No respiratory distress.  Abdominal:     General: Abdomen is flat. There is no distension.  Musculoskeletal: Normal range of motion.  Skin:    General: Skin is warm and dry.     Findings: No erythema or rash.  Neurological:     General: No focal deficit present.     Mental Status: Alert and oriented to person, place, and time.  Mental status is at baseline.     Motor: No weakness.  Psychiatric:        Mood and Affect: Mood normal.        Behavior: Behavior normal.    Assessment/Plan: The patient is scheduled for exchange of right breast tissue expander for right breast implant and exchange of left breast implant for new implant with Dr. Marla Roe.  Risks, benefits, and alternatives of procedure discussed, questions answered and consent obtained.    **Patient reported today that she would like silicone  implants**.  She had previously noted that she would like saline based on previous EMR notes.  I have notified surgical scheduling team.  Smoking Status: Non-smoker; Counseling Given?  N/A Last Mammogram: History of bilateral mastectomies  Caprini Score: 8, high; Risk Factors include: Age, history of breast cancer, swollen legs, BMI rater than 25, and length of planned surgery. Recommendation for mechanical and pharmacological prophylaxis. Encourage early ambulation.  Patient to restart Eliquis postoperatively.  Pictures obtained: 03/21/2021  Post-op Rx sent to pharmacy: Will send tramadol, Zofran, Cipro once surgical date has been confirmed given that date may need to be changed due to coordination with general surgery for resection of left nipple areolar complex.  Patient was provided with the General Surgical Risk consent document and Pain Medication Agreement prior to their appointment.  They had adequate time to read through the risk consent documents and Pain Medication Agreement. We also discussed them in person together during this preop appointment. All of their questions were answered to their satisfaction.  Recommended calling if they have any further questions.  Risk consent form and Pain Medication Agreement to be scanned into patient's chart.  Patient was provided with the [facility] implant patient decision checklist and this was completed during today's preoperative evaluation. Patient had time to read  through the information and any questions were answered to their content. Form will be scanned into patient's chart.  Patient is interested in resection of her left nipple areolar complex to match the right breast.  Our surgical scheduling team has discussed with the general surgery team plans for this and at this time the general surgeon is currently out of town for the scheduled date on 05/05/2021.  I discussed with the patient today that if she is interested in resection of the left nipple areolar complex that we would need to reschedule this for a later date.  We placed injectable saline in the Expander using a sterile technique: Right: 50 cc for a total of 325 / 300 cc   Electronically signed by: Carola Rhine Kyarah Enamorado, PA-C 04/11/2021 12:06 PM

## 2021-04-15 ENCOUNTER — Telehealth: Payer: Self-pay

## 2021-04-15 NOTE — Telephone Encounter (Signed)
Returned call to [facility] --  On hold for 15 minutes.  Disconnected call.

## 2021-04-15 NOTE — Telephone Encounter (Signed)
Returned call to [facility].  Advised ok to continue to fill flecainide.   Per [facility] they had already filled it.

## 2021-04-15 NOTE — Telephone Encounter (Signed)
[facility] mail order pharmacy requesting clarification for drug-drug interaction with escitalopram and flecainide. Pt takes escitalopram 20 mg tablet once daily and Flecainide 100 mg tablets twice day and may take additional tablet daily for increased palpitations. Literature indicates taking both medications may produce prolongation of the QTc interval, which can cause life-threatening arrhythmias, such as torsades de pointes. Does Dr. Lovena Le want to continue to prescribed flecainide? Please address   Ph# [phone] thanks

## 2021-04-29 DIAGNOSIS — I4729 Other ventricular tachycardia: Secondary | ICD-10-CM

## 2021-04-29 MED ORDER — RANOLAZINE ER 500 MG PO TB12: 500.0000 mg | ORAL_TABLET | Freq: Two times a day (BID) | ORAL | 2 refills | Status: DC

## 2021-04-29 NOTE — Progress Notes (Signed)
Surgical Instructions    Your procedure is scheduled on Monday, May 05, 2021  Report to [facility] Main Entrance "A" at 05:30 A.M., then check in with the Admitting office.  Call this number if you have problems the morning of surgery:  [phone]   If you have any questions prior to your surgery date call [phone]: Open Monday-Friday 8am-4pm    Remember:  Do not eat after midnight the night before your surgery  You may drink clear liquids until 04:30 the morning of your surgery.   Clear liquids allowed are: Water, Non-Citrus Juices (without pulp), Carbonated Beverages, Clear Tea, Black Coffee Only, and Gatorade    Take these medicines the morning of surgery with A SIP OF WATER:  flecainide (TAMBOCOR)  ranolazine (RANEXA) thyroid (ARMOUR)  If needed:  acetaminophen (TYLENOL)  diphenhydrAMINE (BENADRYL) famotidine (PEPCID) fluticasone (FLONASE)  omeprazole (PRILOSEC)  ondansetron (ZOFRAN) Simethicone   Follow your surgeon's instructions on when to stop Aspirin and Eliquis.  If no instructions were given by your surgeon then you will need to call the office to get those instructions.     As of today, STOP taking any Aspirin (unless otherwise instructed by your surgeon) Aleve, Naproxen, Ibuprofen, Motrin, Advil, Goody's, BC's, all herbal medications, fish oil, and all vitamins.          Do not wear jewelry or makeup Do not wear lotions, powders, perfumes, or deodorant. Do not shave 48 hours prior to surgery.   Do not bring valuables to the hospital. DO Not wear nail polish, gel polish, artificial nails, or any other type of covering on natural nails including finger and toenails. If patients have artificial nails, gel coating, etc. that need to be removed by a nail salon please have this removed prior to surgery or surgery may need to be canceled/delayed if the surgeon/ anesthesia feels like the patient is unable to be adequately monitored.             [facility] is  not responsible for any belongings or valuables.  Do NOT Smoke (Tobacco/Vaping) or drink Alcohol 24 hours prior to your procedure If you use a CPAP at night, you may bring all equipment for your overnight stay.   Contacts, glasses, dentures or bridgework may not be worn into surgery, please bring cases for these belongings   For patients admitted to the hospital, discharge time will be determined by your treatment team.   Patients discharged the day of surgery will not be allowed to drive home, and someone needs to stay with them for 24 hours.  ONLY 1 SUPPORT PERSON MAY BE PRESENT WHILE YOU ARE IN SURGERY. IF YOU ARE TO BE ADMITTED ONCE YOU ARE IN YOUR ROOM YOU WILL BE ALLOWED TWO (2) VISITORS.  Minor children may have two parents present. Special consideration for safety and communication needs will be reviewed on a case by case basis.  Special instructions:    Oral Hygiene is also important to reduce your risk of infection.  Remember - BRUSH YOUR TEETH THE MORNING OF SURGERY WITH YOUR REGULAR TOOTHPASTE   [facility]- Preparing For Surgery  Before surgery, you can play an important role. Because skin is not sterile, your skin needs to be as free of germs as possible. You can reduce the number of germs on your skin by washing with CHG (chlorahexidine gluconate) Soap before surgery.  CHG is an antiseptic cleaner which kills germs and bonds with the skin to continue killing germs even after washing.  Please do not use if you have an allergy to CHG or antibacterial soaps. If your skin becomes reddened/irritated stop using the CHG.  Do not shave (including legs and underarms) for at least 48 hours prior to first CHG shower. It is OK to shave your face.  Please follow these instructions carefully.     Shower the NIGHT BEFORE SURGERY and the MORNING OF SURGERY with CHG Soap.   If you chose to wash your hair, wash your hair first as usual with your normal shampoo. After you shampoo, rinse  your hair and body thoroughly to remove the shampoo.  Then [employer] and genitals (private parts) with your normal soap and rinse thoroughly to remove soap.  After that Use CHG Soap as you would any other liquid soap. You can apply CHG directly to the skin and wash gently with a scrungie or a clean washcloth.   Apply the CHG Soap to your body ONLY FROM THE NECK DOWN.  Do not use on open wounds or open sores. Avoid contact with your eyes, ears, mouth and genitals (private parts). Wash Face and genitals (private parts)  with your normal soap.   Wash thoroughly, paying special attention to the area where your surgery will be performed.  Thoroughly rinse your body with warm water from the neck down.  DO NOT shower/wash with your normal soap after using and rinsing off the CHG Soap.  Pat yourself dry with a CLEAN TOWEL.  Wear CLEAN PAJAMAS to bed the night before surgery  Place CLEAN SHEETS on your bed the night before your surgery  DO NOT SLEEP WITH PETS.   Day of Surgery:  Take a shower with CHG soap. Wear Clean/Comfortable clothing the morning of surgery Do not apply any deodorants/lotions.   Remember to brush your teeth WITH YOUR REGULAR TOOTHPASTE.   Please read over the following fact sheets that you were given.

## 2021-04-30 ENCOUNTER — Encounter (HOSPITAL_COMMUNITY)
Admission: RE | Admit: 2021-04-30 | Discharge: 2021-04-30 | Disposition: A | Discharge status: Home / Self Care | Payer: PPO | Source: Ambulatory Visit | Attending: Plastic Surgery | Admitting: Plastic Surgery

## 2021-04-30 ENCOUNTER — Other Ambulatory Visit: Payer: Self-pay

## 2021-04-30 ENCOUNTER — Encounter (HOSPITAL_COMMUNITY): Payer: Self-pay

## 2021-04-30 DIAGNOSIS — Z01812 Encounter for preprocedural laboratory examination: Secondary | ICD-10-CM | POA: Insufficient documentation

## 2021-04-30 HISTORY — DX: Dyspnea, unspecified: R06.00

## 2021-04-30 LAB — BASIC METABOLIC PANEL
Anion gap: 5 (ref 5–15)
BUN: 20 mg/dL (ref 8–23)
CO2: 30 mmol/L (ref 22–32)
Calcium: 9.6 mg/dL (ref 8.9–10.3)
Chloride: 102 mmol/L (ref 98–111)
Creatinine, Ser: 0.75 mg/dL (ref 0.44–1.00)
GFR, Estimated: >60 mL/min (ref >60)
Glucose, Bld: 99 mg/dL (ref 70–99)
Potassium: 3.8 mmol/L (ref 3.5–5.1)
Sodium: 137 mmol/L (ref 135–145)

## 2021-04-30 LAB — CBC
HCT: 39.2 % (ref 36.0–46.0)
Hemoglobin: 13.2 g/dL (ref 12.0–15.0)
MCH: 32.8 pg (ref 26.0–34.0)
MCHC: 33.7 g/dL (ref 30.0–36.0)
MCV: 97.5 fL (ref 80.0–100.0)
Platelets: 164 10*3/uL (ref 150–400)
RBC: 4.02 MIL/uL (ref 3.87–5.11)
RDW: 14 % (ref 11.5–15.5)
WBC: 4.3 10*3/uL (ref 4.0–10.5)
nRBC: 0 % (ref 0.0–0.2)

## 2021-04-30 NOTE — Progress Notes (Signed)
PCP - Laverna Peace, NP Cardiologist - Dr. Cristopher Peru  PPM/ICD - denies  Chest x-ray - 07/17/20 EKG - 01/06/21 Stress Test - greater than 5 years ago per pt ECHO - 07/09/20 Cardiac Cath - denies  Sleep Study - denies  DM: denies  Blood Thinner Instructions: Pt states she was told to hold Eliquis 2 days prior to surgery Aspirin Instructions: Pt told to hold Aspirin 5 days prior to surgery  ERAS Protcol - No  COVID TEST- n/a Ambulatory surgery   Anesthesia review: yes, cardiac history. Cardiac clearance note from 12-2020  Patient denies shortness of breath, fever, cough and chest pain at PAT appointment   All instructions explained to the patient, with a verbal understanding of the material. Patient agrees to go over the instructions while at home for a better understanding. The opportunity to ask questions was provided.

## 2021-04-30 NOTE — Progress Notes (Signed)
Surgical Instructions    Your procedure is scheduled on Monday, May 05, 2021  Report to [facility] Main Entrance "A" at 05:30 A.M., then check in with the Admitting office.  Call this number if you have problems the morning of surgery:  [phone]   If you have any questions prior to your surgery date call [phone]: Open Monday-Friday 8am-4pm    Remember:  Do not eat or drink after midnight the night before your surgery      Take these medicines the morning of surgery with A SIP OF WATER:  flecainide (TAMBOCOR)  ranolazine (RANEXA) thyroid (ARMOUR)  If needed:  acetaminophen (TYLENOL)  diphenhydrAMINE (BENADRYL) famotidine (PEPCID) fluticasone (FLONASE)  omeprazole (PRILOSEC)  ondansetron (ZOFRAN) Simethicone   Follow your surgeon's instructions on when to stop Eliquis.  If no instructions were given by your surgeon then you will need to call the office to get those instructions.     Please stop Aspirin 5 days prior to surgery, per Dr. Tanna Furry recommendations.  As of today, STOP taking any (unless otherwise instructed by your surgeon) Aleve, Naproxen, Ibuprofen, Motrin, Advil, Goody's, BC's, all herbal medications, fish oil, and all vitamins.          Do not wear jewelry or makeup Do not wear lotions, powders, perfumes, or deodorant. Do not shave 48 hours prior to surgery.   Do not bring valuables to the hospital. DO Not wear nail polish, gel polish, artificial nails, or any other type of covering on natural nails including finger and toenails. If patients have artificial nails, gel coating, etc. that need to be removed by a nail salon please have this removed prior to surgery or surgery may need to be canceled/delayed if the surgeon/ anesthesia feels like the patient is unable to be adequately monitored.             [place] is not responsible for any belongings or valuables.  Do NOT Smoke (Tobacco/Vaping) or drink Alcohol 24 hours prior to your  procedure If you use a CPAP at night, you may bring all equipment for your overnight stay.   Contacts, glasses, dentures or bridgework may not be worn into surgery, please bring cases for these belongings   For patients admitted to the hospital, discharge time will be determined by your treatment team.   Patients discharged the day of surgery will not be allowed to drive home, and someone needs to stay with them for 24 hours.  ONLY 1 SUPPORT PERSON MAY BE PRESENT WHILE YOU ARE IN SURGERY. IF YOU ARE TO BE ADMITTED ONCE YOU ARE IN YOUR ROOM YOU WILL BE ALLOWED TWO (2) VISITORS.  Minor children may have two parents present. Special consideration for safety and communication needs will be reviewed on a case by case basis.  Special instructions:    Oral Hygiene is also important to reduce your risk of infection.  Remember - BRUSH YOUR TEETH THE MORNING OF SURGERY WITH YOUR REGULAR TOOTHPASTE   [facility]- Preparing For Surgery  Before surgery, you can play an important role. Because skin is not sterile, your skin needs to be as free of germs as possible. You can reduce the number of germs on your skin by washing with CHG (chlorahexidine gluconate) Soap before surgery.  CHG is an antiseptic cleaner which kills germs and bonds with the skin to continue killing germs even after washing.     Please do not use if you have an allergy to CHG or antibacterial soaps. If  your skin becomes reddened/irritated stop using the CHG.  Do not shave (including legs and underarms) for at least 48 hours prior to first CHG shower. It is OK to shave your face.  Please follow these instructions carefully.     Shower the NIGHT BEFORE SURGERY and the MORNING OF SURGERY with CHG Soap.   If you chose to wash your hair, wash your hair first as usual with your normal shampoo. After you shampoo, rinse your hair and body thoroughly to remove the shampoo.  Then [employer] and genitals (private parts) with your normal soap  and rinse thoroughly to remove soap.  After that Use CHG Soap as you would any other liquid soap. You can apply CHG directly to the skin and wash gently with a scrungie or a clean washcloth.   Apply the CHG Soap to your body ONLY FROM THE NECK DOWN.  Do not use on open wounds or open sores. Avoid contact with your eyes, ears, mouth and genitals (private parts). Wash Face and genitals (private parts)  with your normal soap.   Wash thoroughly, paying special attention to the area where your surgery will be performed.  Thoroughly rinse your body with warm water from the neck down.  DO NOT shower/wash with your normal soap after using and rinsing off the CHG Soap.  Pat yourself dry with a CLEAN TOWEL.  Wear CLEAN PAJAMAS to bed the night before surgery  Place CLEAN SHEETS on your bed the night before your surgery  DO NOT SLEEP WITH PETS.   Day of Surgery:  Take a shower with CHG soap. Wear Clean/Comfortable clothing the morning of surgery Do not apply any deodorants/lotions.   Remember to brush your teeth WITH YOUR REGULAR TOOTHPASTE.   Please read over the following fact sheets that you were given.

## 2021-05-01 NOTE — Anesthesia Preprocedure Evaluation (Addendum)
Anesthesia Evaluation  Patient identified by MRN, date of birth, ID band Patient awake    Reviewed: Allergy & Precautions, NPO status , Patient's Chart, lab work & pertinent test results  History of Anesthesia Complications (+) PONV and history of anesthetic complications  Airway Mallampati: I  TM Distance: >3 FB Neck ROM: Full    Dental  (+) Teeth Intact, Dental Advisory Given   Pulmonary neg pulmonary ROS,    Pulmonary exam normal breath sounds clear to auscultation       Cardiovascular hypertension, Pt. on medications (-) anginaNormal cardiovascular exam+ dysrhythmias Atrial Fibrillation and Ventricular Tachycardia + Valvular Problems/Murmurs MVP  Rhythm:Regular Rate:Normal  VT (VT ablation 01/21/10; PVCs managed on flecainide), PAF (on flecainide and diltiazem)   Neuro/Psych  Headaches, PSYCHIATRIC DISORDERS Depression C6-7 ACDF  Neuromuscular disease    GI/Hepatic Neg liver ROS, GERD  ,  Endo/Other  Hypothyroidism   Renal/GU negative Renal ROS     Musculoskeletal  (+) Arthritis , Fibromyalgia -  Abdominal   Peds  Hematology negative hematology ROS (+)   Anesthesia Other Findings Status post right mastectomy, Malignant neoplasm of central portion of right breast in female, estrogen receptor positive  Reproductive/Obstetrics                           Anesthesia Physical Anesthesia Plan  ASA: 3  Anesthesia Plan: General   Post-op Pain Management:    Induction: Intravenous  PONV Risk Score and Plan: 4 or greater and Propofol infusion, Dexamethasone and Ondansetron  Airway Management Planned: Oral ETT and Video Laryngoscope Planned  Additional Equipment:   Intra-op Plan:   Post-operative Plan: Extubation in OR  Informed Consent: I have reviewed the patients History and Physical, chart, labs and discussed the procedure including the risks, benefits and alternatives for the  proposed anesthesia with the patient or authorized representative who has indicated his/her understanding and acceptance.     Dental advisory given  Plan Discussed with: CRNA  Anesthesia Plan Comments: (PAT note written 05/01/2021 by Myra Gianotti, PA-C. )     Anesthesia Quick Evaluation

## 2021-05-01 NOTE — Progress Notes (Signed)
Anesthesia Chart Review:  Case: [id number] Date/Time: 05/05/21 0715   Procedures:      REMOVAL OF TISSUE EXPANDER AND PLACEMENT OF IMPLANT (Right: Breast)     BREAST IMPLANT EXCHANGE (Left: Breast)   Anesthesia type: General   Pre-op diagnosis: Status post right mastectomy, Malignant neoplasm of central portion of right breast in female, estrogen receptor positive   Location: [facility] OR ROOM 02 / [facility]   Surgeons: Wallace Going, DO       DISCUSSION: Patient is a 75 year old female scheduled for the above procedure.  History includes  never smoker, post-operative N/V, murmur/MVP, VT (VT ablation 01/21/10; PVCs managed on flecainide), PAF (on flecainide and diltiazem), HTN, dyslipidemia, dyspnea, GERD, fibromyalgia, thyroid cancer (s/p thyroidectomy 01/13/00, post-surgical hypothyroidism), skin cancer ([place] & melanoma 2001), breast cancer (s/ bilateral nipple sparing mastectomies with implant reconstruction 1992 for high risk disease; right breast invasive lobular carcinoma, s/p right breast lumpectomy, removal or right breast implant and capsulectomy 02/05/20; s/p radiation 03/13/20-04/23/20, she declined anti-estrogen therapy; s/p breast reconstruction with placement of tissue expander 01/20/21), neck surgery (C6-7 ACDF 06/16/00), back surgery.   Plastic surgery obtained cardiology input prior to her previous breast reconstruction surgery on 01/20/21. Per 04/11/21 note by Roetta Sessions, PA, "Patient was previously cleared by cardiology for her initial surgery with our office on 01/20/2021.  Patient received clearance on 01/06/2021.  They noted patient can stop ASA 81 mg 5 days prior to surgery and hold Eliquis 2 days prior to surgery.  Patient reports today that she has not had any changes since that visit.  She reports no chest pain, palpitations, dyspnea, orthopnea, nausea, vomiting, dizziness, syncope." She reported instructions to hold Eliquis 2 days prior to surgery and ASA 5 days prior to surgery.    Anesthesia team to evaluate on the day of surgery.  VS: BP (!) 159/65   Pulse 75   Temp 36.5 C (Oral)   Resp 17   Ht 5' 4.5" (1.638 m)   Wt 69.5 kg   SpO2 98%   BMI 25.89 kg/m    PROVIDERS: [name], NP is PCP  - Cristopher Peru, MD is EP cardiologist. Last visit 01/06/21 with Barrington Ellison, PA. He classified her at "relatively low risk of peri-operative complications from a cardiac perspective by Revised Cardiac Risk Index [place] Criteria)" at that time. Nicholas Lose, MD is HEM-ONC. Last visit 02/04/21. He wrote, "no role of imaging since she had bilateral mastectomies  Return to clinic on an as-needed basis" - Eppie Gibson, MD is RAD-ONC - Sherrilyn Rist, MD is pulmonologist. Last evaluation 08/15/20 for abnormal chest CT. Findings felt to represent postradiation inflammatory changes.  He did not feel her shortness of breath was significant enough to warrant use of steroids.  Clinical monitoring recommended, and if any worsening of symptoms PFTs or repeat CT may be considered.  Tentatively 49-month follow-up planned.   LABS: Labs reviewed: Acceptable for surgery. (all labs ordered are listed, but only abnormal results are displayed)  Labs Reviewed  BASIC METABOLIC PANEL  CBC     IMAGES: CT Chest 08/06/20: IMPRESSION: 1. New peripheral airspace opacity in the right upper lobe, right middle lobe, and right lower lobe, probably from radiation port given position and appearance. 2. New small right pleural effusion. 3. Borderline enlarged right hilar lymph node, nonspecific. 4. Other imaging findings of potential clinical significance: Coronary atherosclerosis. Thyroidectomy. Right mastectomy, left breast prosthesis. Stable dense calcification along the upper margin of the left hemidiaphragm adjacent  to the pericardial space, not previously hypermetabolic, likely postinflammatory. Hemangioma in the T11 vertebra. 5. Aortic atherosclerosis. - Saw pulmonologist Dr.  Ander Slade 08/15/20. Clinical monitoring for postradiation inflammatory changes.   EKG: 01/06/21: SR. Non-specific ST abnormality. First degree AV block (PR 214 ms). QRS 104 ms.    CV: Echo 07/09/20: IMPRESSIONS   1. Left ventricular ejection fraction, by estimation, is 55 to 60%. The  left ventricle has normal function. The left ventricle has no regional  wall motion abnormalities. Left ventricular diastolic parameters are  consistent with Grade I diastolic  dysfunction (impaired relaxation).   2. Right ventricular systolic function is normal. The right ventricular  size is normal. There is normal pulmonary artery systolic pressure. The  estimated right ventricular systolic pressure is 83.1 mmHg.   3. P2 prolpase with late systolic MR that is mild. There is mitral  annular disjunction noted which is associated with PVCs. 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 Mild mitral valve regurgitation.   4. The aortic valve is tricuspid. Aortic valve regurgitation is mild. No  aortic stenosis is present.   5. The inferior vena cava is normal in size with greater than 50%  respiratory variability, suggesting right atrial pressure of 3 mmHg.   6. Evidence of atrial level shunting detected by color flow Doppler.  There is a small patent foramen ovale with predominantly left to right  shunting across the atrial septum.  - Comparison(s): No significant change from prior study.   Cardiac event monitor 04/17/16-05/16/16: Study Highlights 1. NSR 2. Sinus tachycardia 3. PAF 4. Frequent PVC's with multiple QRS morphologies 5. NSVT 6. Frequent PAC's 7. No sustained arrhythmias and no significant pauses.   Past Medical History:  Diagnosis Date   A-fib [facility])    Arthritis    thumb   Basal cell carcinoma 2001   "forehead, between eyebrows" right arm (2022)   Breast cancer ([place])    Carcinoma of thyroid gland ([place]) 2001   Chronic lower back pain    "worse is across my hips" (05/26/2016)   Depression     Dyslipidemia    Dyspnea    Family history of breast cancer    Family history of kidney cancer    Family history of leukemia    Family history of nonmelanoma skin cancer    Fibrocystic breast    Fibromyalgia    GERD (gastroesophageal reflux disease)    Heart murmur    History of blood transfusion 1992   "after subcutaneous mastectomies"   Hypertension    not on any medications   Hypothyroidism    Malignant melanoma of left ankle ([place]) 2001   Migraine    "visual; 2-3 times/year" (05/26/2016)   Mitral valve prolapse    PONV (postoperative nausea and vomiting)    Ventricular tachycardia ([facility])    Hx of, controlled on sotalol therapy    Past Surgical History:  Procedure Laterality Date   ABDOMINAL HYSTERECTOMY  1998   ANTERIOR CERVICAL DECOMP/DISCECTOMY FUSION  2001   BACK SURGERY     BASAL CELL CARCINOMA EXCISION  2001   "cut it out & did a flap, on forehead between my eyebrows"   BREAST IMPLANT EXCHANGE Bilateral 2001   [id number]   BREAST IMPLANT REMOVAL Right 02/05/2020   Procedure: REMOVAL RIGHT BREAST IMPLANT AND CAPSULECTOMY;  Surgeon: Jovita Kussmaul, MD;  Location: [facility];  Service: General;  Laterality: Right;   BREAST LUMPECTOMY Right 02/05/2020   Procedure: RIGHT BREAST CENTRAL LUMPECTOMY;  Surgeon:  Jovita Kussmaul, MD;  Location: [facility];  Service: General;  Laterality: Right;   BREAST RECONSTRUCTION WITH PLACEMENT OF TISSUE EXPANDER AND FLEX HD (ACELLULAR HYDRATED DERMIS) Right 01/20/2021   Procedure: BREAST RECONSTRUCTION WITH PLACEMENT OF TISSUE EXPANDER AND FLEX HD (ACELLULAR HYDRATED DERMIS);  Surgeon: Wallace Going, DO;  Location: [facility];  Service: Plastics;  Laterality: Right;  2 hours   CARPAL TUNNEL RELEASE Right 2006   COLONOSCOPY     DILATION AND CURETTAGE OF UTERUS  1970s X 2-3   ELECTROPHYSIOLOGIC STUDY  1994 X 2;2001   "to see it it was sustained VT; cause thyroid levels were causing arrhythmias"   EYE SURGERY   12/2020   cataracts   LAPAROSCOPIC CHOLECYSTECTOMY     MASTECTOMY Bilateral 1992   "subcutaneous"   MELANOMA EXCISION Left 2001   "ankle, stage I"   PLACEMENT OF BREAST IMPLANTS Bilateral 1992   [id number]   TOTAL THYROIDECTOMY  12/1999   "cancer"   VENTRICULAR ABLATION SURGERY  2011   ventricular tchycardia    MEDICATIONS:  acetaminophen (TYLENOL) 500 MG tablet   ALPRAZolam (XANAX) 0.5 MG tablet   apixaban (ELIQUIS) 5 MG TABS tablet   Ascorbic Acid (VITAMIN C) 1000 MG tablet   aspirin 81 MG EC tablet   B Complex-C (SUPER B COMPLEX PO)   beta carotene 10000 UNIT capsule   Biotin 5 MG CAPS   Calcium 600-200 MG-UNIT tablet   cetirizine (ZYRTEC) 10 MG tablet   Cholecalciferol (VITAMIN D) 125 MCG (5000 UT) CAPS   Coenzyme Q10 (COQ10) 100 MG CAPS   cyclobenzaprine (FLEXERIL) 5 MG tablet   diazepam (VALIUM) 2 MG tablet   diazepam (VALIUM) 2 MG tablet   diltiazem (CARDIZEM CD) 120 MG 24 hr capsule   diphenhydrAMINE (BENADRYL) 25 MG tablet   docusate sodium (COLACE) 100 MG capsule   escitalopram (LEXAPRO) 20 MG tablet   famotidine (PEPCID) 20 MG tablet   flecainide (TAMBOCOR) 100 MG tablet   fluticasone (FLONASE) 50 MCG/ACT nasal spray   furosemide (LASIX) 20 MG tablet   magnesium hydroxide (MILK OF MAGNESIA) 400 MG/5ML suspension   MAGNESIUM MALATE PO   MANGANESE PO   Melatonin 10 MG TABS   Niacin, Antihyperlipidemic, 500 MG TABS   omeprazole (PRILOSEC) 20 MG capsule   ondansetron (ZOFRAN) 4 MG tablet   OVER THE COUNTER MEDICATION   ranolazine (RANEXA) 500 MG 12 hr tablet   Simethicone 125 MG CAPS   Thiamine HCl (VITAMIN B1) 100 MG TABS   thyroid (ARMOUR) 120 MG tablet   Zinc 22.5 MG TABS   No current facility-administered medications for this encounter.    Myra Gianotti, PA-C Surgical Short Stay/Anesthesiology [facility] Phone [phone] [facility] Phone [phone] 05/01/2021 3:40 PM

## 2021-05-05 ENCOUNTER — Ambulatory Visit (HOSPITAL_COMMUNITY)
Admission: RE | Admit: 2021-05-05 | Discharge: 2021-05-05 | Disposition: A | Discharge status: Home / Self Care | Payer: PPO | Attending: Plastic Surgery | Admitting: Plastic Surgery

## 2021-05-05 ENCOUNTER — Encounter (HOSPITAL_COMMUNITY)
Admission: RE | Disposition: A | Discharge status: Home / Self Care | Payer: Self-pay | Source: Home / Self Care | Attending: Plastic Surgery

## 2021-05-05 ENCOUNTER — Ambulatory Visit (HOSPITAL_COMMUNITY): Payer: PPO | Admitting: Anesthesiology

## 2021-05-05 ENCOUNTER — Other Ambulatory Visit: Payer: Self-pay

## 2021-05-05 ENCOUNTER — Encounter (HOSPITAL_COMMUNITY): Payer: Self-pay | Admitting: Plastic Surgery

## 2021-05-05 ENCOUNTER — Ambulatory Visit (HOSPITAL_COMMUNITY): Payer: PPO | Admitting: Physician Assistant

## 2021-05-05 ENCOUNTER — Other Ambulatory Visit: Payer: Self-pay | Admitting: Surgical

## 2021-05-05 DIAGNOSIS — Z853 Personal history of malignant neoplasm of breast: Secondary | ICD-10-CM | POA: Diagnosis not present

## 2021-05-05 DIAGNOSIS — Z421 Encounter for breast reconstruction following mastectomy: Secondary | ICD-10-CM | POA: Insufficient documentation

## 2021-05-05 DIAGNOSIS — Z45812 Encounter for adjustment or removal of left breast implant: Secondary | ICD-10-CM | POA: Diagnosis not present

## 2021-05-05 DIAGNOSIS — Z79899 Other long term (current) drug therapy: Secondary | ICD-10-CM | POA: Insufficient documentation

## 2021-05-05 DIAGNOSIS — Z9013 Acquired absence of bilateral breasts and nipples: Secondary | ICD-10-CM | POA: Diagnosis not present

## 2021-05-05 DIAGNOSIS — Z803 Family history of malignant neoplasm of breast: Secondary | ICD-10-CM | POA: Diagnosis not present

## 2021-05-05 DIAGNOSIS — C50111 Malignant neoplasm of central portion of right female breast: Secondary | ICD-10-CM | POA: Diagnosis not present

## 2021-05-05 DIAGNOSIS — Z7901 Long term (current) use of anticoagulants: Secondary | ICD-10-CM | POA: Diagnosis not present

## 2021-05-05 DIAGNOSIS — I480 Paroxysmal atrial fibrillation: Secondary | ICD-10-CM | POA: Diagnosis not present

## 2021-05-05 DIAGNOSIS — Z7982 Long term (current) use of aspirin: Secondary | ICD-10-CM | POA: Diagnosis not present

## 2021-05-05 DIAGNOSIS — Z45811 Encounter for adjustment or removal of right breast implant: Secondary | ICD-10-CM | POA: Diagnosis not present

## 2021-05-05 HISTORY — PX: BREAST IMPLANT EXCHANGE: SHX6296

## 2021-05-05 HISTORY — PX: REMOVAL OF TISSUE EXPANDER AND PLACEMENT OF IMPLANT: SHX6457

## 2021-05-05 SURGERY — REMOVAL, TISSUE EXPANDER, BREAST, WITH IMPLANT INSERTION
Anesthesia: General | Site: Breast | Laterality: Right

## 2021-05-05 MED ORDER — MIDAZOLAM HCL 2 MG/2ML IJ SOLN
1.0000 mg | Freq: Once | INTRAMUSCULAR | Status: AC
  Administered 2021-05-05: 1 mg via INTRAVENOUS

## 2021-05-05 MED ORDER — LIDOCAINE 2% (20 MG/ML) 5 ML SYRINGE
INTRAMUSCULAR | Status: DC | PRN
  Administered 2021-05-05: 60 mg via INTRAVENOUS

## 2021-05-05 MED ORDER — PROPOFOL 10 MG/ML IV BOLUS
INTRAVENOUS | Status: AC
  Filled 2021-05-05: qty 20

## 2021-05-05 MED ORDER — ORAL CARE MOUTH RINSE: 15.0000 mL | Freq: Once | OROMUCOSAL | Status: AC

## 2021-05-05 MED ORDER — PROMETHAZINE HCL 25 MG/ML IJ SOLN: 6.2500 mg | INTRAMUSCULAR | Status: DC | PRN

## 2021-05-05 MED ORDER — ACETAMINOPHEN 500 MG PO TABS
1000.0000 mg | ORAL_TABLET | Freq: Once | ORAL | Status: AC
  Administered 2021-05-05: 1000 mg via ORAL
  Filled 2021-05-05: qty 2

## 2021-05-05 MED ORDER — FENTANYL CITRATE (PF) 100 MCG/2ML IJ SOLN
INTRAMUSCULAR | Status: AC
  Filled 2021-05-05: qty 2

## 2021-05-05 MED ORDER — LIDOCAINE HCL 1 % IJ SOLN
INTRAMUSCULAR | Status: DC | PRN
  Administered 2021-05-05: 7.5 mL

## 2021-05-05 MED ORDER — CIPROFLOXACIN IN D5W 400 MG/200ML IV SOLN
400.0000 mg | INTRAVENOUS | Status: AC
  Administered 2021-05-05: 400 mg via INTRAVENOUS
  Filled 2021-05-05: qty 200

## 2021-05-05 MED ORDER — VANCOMYCIN HCL 1000 MG IV SOLR
INTRAVENOUS | Status: AC
  Filled 2021-05-05: qty 1000

## 2021-05-05 MED ORDER — CIPROFLOXACIN HCL 500 MG PO TABS: 500.0000 mg | ORAL_TABLET | Freq: Two times a day (BID) | ORAL | 0 refills | Status: AC

## 2021-05-05 MED ORDER — ROCURONIUM BROMIDE 10 MG/ML (PF) SYRINGE
PREFILLED_SYRINGE | INTRAVENOUS | Status: AC
  Filled 2021-05-05: qty 10

## 2021-05-05 MED ORDER — DEXAMETHASONE SODIUM PHOSPHATE 10 MG/ML IJ SOLN
INTRAMUSCULAR | Status: DC | PRN
  Administered 2021-05-05: 10 mg via INTRAVENOUS

## 2021-05-05 MED ORDER — FENTANYL CITRATE (PF) 250 MCG/5ML IJ SOLN
INTRAMUSCULAR | Status: DC | PRN
  Administered 2021-05-05 (×2): 50 ug via INTRAVENOUS

## 2021-05-05 MED ORDER — DEXAMETHASONE SODIUM PHOSPHATE 10 MG/ML IJ SOLN
INTRAMUSCULAR | Status: AC
  Filled 2021-05-05: qty 1

## 2021-05-05 MED ORDER — SODIUM CHLORIDE 0.9 % IV SOLN
INTRAVENOUS | Status: DC | PRN
  Administered 2021-05-05: 500 mL

## 2021-05-05 MED ORDER — EPHEDRINE SULFATE 50 MG/ML IJ SOLN
INTRAMUSCULAR | Status: DC | PRN
  Administered 2021-05-05 (×3): 10 mg via INTRAVENOUS

## 2021-05-05 MED ORDER — PHENYLEPHRINE HCL (PRESSORS) 10 MG/ML IV SOLN
INTRAVENOUS | Status: DC | PRN
  Administered 2021-05-05: 100 ug via INTRAVENOUS

## 2021-05-05 MED ORDER — PROPOFOL 10 MG/ML IV BOLUS
INTRAVENOUS | Status: DC | PRN
  Administered 2021-05-05: 100 mg via INTRAVENOUS

## 2021-05-05 MED ORDER — MIDAZOLAM HCL 5 MG/5ML IJ SOLN
INTRAMUSCULAR | Status: DC | PRN
  Administered 2021-05-05 (×2): 1 mg via INTRAVENOUS

## 2021-05-05 MED ORDER — CHLORHEXIDINE GLUCONATE 0.12 % MT SOLN
15.0000 mL | Freq: Once | OROMUCOSAL | Status: AC
  Administered 2021-05-05: 15 mL via OROMUCOSAL
  Filled 2021-05-05: qty 15

## 2021-05-05 MED ORDER — GENTAMICIN SULFATE 40 MG/ML IJ SOLN
INTRAMUSCULAR | Status: AC
  Filled 2021-05-05: qty 2

## 2021-05-05 MED ORDER — PHENYLEPHRINE HCL-NACL 10-0.9 MG/250ML-% IV SOLN
INTRAVENOUS | Status: AC
  Filled 2021-05-05: qty 250

## 2021-05-05 MED ORDER — PHENYLEPHRINE 40 MCG/ML (10ML) SYRINGE FOR IV PUSH (FOR BLOOD PRESSURE SUPPORT)
PREFILLED_SYRINGE | INTRAVENOUS | Status: AC
  Filled 2021-05-05: qty 10

## 2021-05-05 MED ORDER — FENTANYL CITRATE (PF) 250 MCG/5ML IJ SOLN
INTRAMUSCULAR | Status: AC
  Filled 2021-05-05: qty 5

## 2021-05-05 MED ORDER — SUGAMMADEX SODIUM 500 MG/5ML IV SOLN
INTRAVENOUS | Status: DC | PRN
  Administered 2021-05-05: 300 mg via INTRAVENOUS

## 2021-05-05 MED ORDER — VANCOMYCIN HCL 1000 MG IV SOLR
INTRAVENOUS | Status: DC | PRN
  Administered 2021-05-05: 1000 mg

## 2021-05-05 MED ORDER — TRAMADOL HCL 50 MG PO TABS: 50.0000 mg | ORAL_TABLET | Freq: Four times a day (QID) | ORAL | 0 refills | Status: AC | PRN

## 2021-05-05 MED ORDER — BUPIVACAINE-EPINEPHRINE (PF) 0.25% -1:200000 IJ SOLN
INTRAMUSCULAR | Status: AC
  Filled 2021-05-05: qty 30

## 2021-05-05 MED ORDER — MIDAZOLAM HCL 2 MG/2ML IJ SOLN
INTRAMUSCULAR | Status: AC
  Filled 2021-05-05: qty 2

## 2021-05-05 MED ORDER — ONDANSETRON HCL 4 MG/2ML IJ SOLN
INTRAMUSCULAR | Status: DC | PRN
  Administered 2021-05-05: 4 mg via INTRAVENOUS

## 2021-05-05 MED ORDER — LIDOCAINE 2% (20 MG/ML) 5 ML SYRINGE
INTRAMUSCULAR | Status: AC
  Filled 2021-05-05: qty 5

## 2021-05-05 MED ORDER — BACITRACIN ZINC 500 UNIT/GM EX OINT
TOPICAL_OINTMENT | CUTANEOUS | Status: AC
  Filled 2021-05-05: qty 28.35

## 2021-05-05 MED ORDER — ROCURONIUM BROMIDE 100 MG/10ML IV SOLN
INTRAVENOUS | Status: DC | PRN
  Administered 2021-05-05: 20 mg via INTRAVENOUS
  Administered 2021-05-05: 60 mg via INTRAVENOUS

## 2021-05-05 MED ORDER — 0.9 % SODIUM CHLORIDE (POUR BTL) OPTIME
TOPICAL | Status: DC | PRN
  Administered 2021-05-05: 1000 mL

## 2021-05-05 MED ORDER — LIDOCAINE HCL (PF) 1 % IJ SOLN
INTRAMUSCULAR | Status: AC
  Filled 2021-05-05: qty 30

## 2021-05-05 MED ORDER — FENTANYL CITRATE (PF) 100 MCG/2ML IJ SOLN
25.0000 ug | INTRAMUSCULAR | Status: DC | PRN
  Administered 2021-05-05: 50 ug via INTRAVENOUS

## 2021-05-05 MED ORDER — EPHEDRINE 5 MG/ML INJ
INTRAVENOUS | Status: AC
  Filled 2021-05-05: qty 10

## 2021-05-05 MED ORDER — ONDANSETRON HCL 4 MG PO TABS: 4.0000 mg | ORAL_TABLET | Freq: Three times a day (TID) | ORAL | 0 refills | Status: DC | PRN

## 2021-05-05 MED ORDER — CHLORHEXIDINE GLUCONATE CLOTH 2 % EX PADS: 6.0000 | MEDICATED_PAD | Freq: Once | CUTANEOUS | Status: DC

## 2021-05-05 MED ORDER — LACTATED RINGERS IV SOLN: INTRAVENOUS | Status: DC

## 2021-05-05 MED ORDER — ONDANSETRON HCL 4 MG/2ML IJ SOLN
INTRAMUSCULAR | Status: AC
  Filled 2021-05-05: qty 2

## 2021-05-05 MED ORDER — BUPIVACAINE-EPINEPHRINE 0.25% -1:200000 IJ SOLN
INTRAMUSCULAR | Status: DC | PRN
  Administered 2021-05-05: 7.5 mL

## 2021-05-05 SURGICAL SUPPLY — 42 items
BAG COUNTER SPONGE SURGICOUNT (BAG) ×3 IMPLANT
BAG SURGICOUNT SPONGE COUNTING (BAG) ×1
BINDER BREAST XLRG (GAUZE/BANDAGES/DRESSINGS) ×4 IMPLANT
BIOPATCH RED 1 DISK 7.0 (GAUZE/BANDAGES/DRESSINGS) ×6 IMPLANT
BIOPATCH RED 1IN DISK 7.0MM (GAUZE/BANDAGES/DRESSINGS) ×2
BNDG ELASTIC 6X5.8 VLCR STR LF (GAUZE/BANDAGES/DRESSINGS) IMPLANT
CANISTER SUCT 3000ML PPV (MISCELLANEOUS) ×4 IMPLANT
CHLORAPREP W/TINT 26 (MISCELLANEOUS) ×4 IMPLANT
CLOSURE WOUND 1/2 X4 (GAUZE/BANDAGES/DRESSINGS) ×3
COVER SURGICAL LIGHT HANDLE (MISCELLANEOUS) ×4 IMPLANT
DERMABOND ADVANCED (GAUZE/BANDAGES/DRESSINGS) ×2
DERMABOND ADVANCED .7 DNX12 (GAUZE/BANDAGES/DRESSINGS) ×2 IMPLANT
DRAIN CHANNEL 19F RND (DRAIN) IMPLANT
DRAPE LAPAROSCOPIC ABDOMINAL (DRAPES) ×4 IMPLANT
DRSG PAD ABDOMINAL 8X10 ST (GAUZE/BANDAGES/DRESSINGS) ×4 IMPLANT
ELECT REM PT RETURN 9FT ADLT (ELECTROSURGICAL) ×4
ELECTRODE REM PT RTRN 9FT ADLT (ELECTROSURGICAL) ×2 IMPLANT
EVACUATOR SILICONE 100CC (DRAIN) IMPLANT
FUNNEL KELLER 2 DISP (MISCELLANEOUS) ×4 IMPLANT
GAUZE SPONGE 4X4 12PLY STRL (GAUZE/BANDAGES/DRESSINGS) IMPLANT
GLOVE SURG ENC MOIS LTX SZ6.5 (GLOVE) ×12 IMPLANT
GOWN STRL REUS W/ TWL LRG LVL3 (GOWN DISPOSABLE) ×6 IMPLANT
GOWN STRL REUS W/TWL LRG LVL3 (GOWN DISPOSABLE) ×12
IMPL BREAST GEL HP 400CC (Breast) ×2 IMPLANT
IMPL GEL HI PROFILE 350CC (Breast) ×2 IMPLANT
IMPLANT BREAST GEL HP 400CC (Breast) ×4 IMPLANT
IMPLANT GEL HI PROFILE 350CC (Breast) ×4 IMPLANT
KIT BASIN OR (CUSTOM PROCEDURE TRAY) ×4 IMPLANT
KIT TURNOVER KIT B (KITS) ×4 IMPLANT
MARKER SKIN DUAL TIP RULER LAB (MISCELLANEOUS) ×4 IMPLANT
NS IRRIG 1000ML POUR BTL (IV SOLUTION) ×4 IMPLANT
PACK GENERAL/GYN (CUSTOM PROCEDURE TRAY) ×4 IMPLANT
PAD ARMBOARD 7.5X6 YLW CONV (MISCELLANEOUS) ×8 IMPLANT
STAPLER VISISTAT 35W (STAPLE) ×4 IMPLANT
STRIP CLOSURE SKIN 1/2X4 (GAUZE/BANDAGES/DRESSINGS) ×9 IMPLANT
SUT MNCRL AB 3-0 PS2 18 (SUTURE) ×8 IMPLANT
SUT PROLENE 3 0 PS 2 (SUTURE) IMPLANT
SUT VIC AB 3-0 SH 18 (SUTURE) ×4 IMPLANT
TAPE STRIPS DRAPE STRL (GAUZE/BANDAGES/DRESSINGS) ×4 IMPLANT
TOWEL GREEN STERILE (TOWEL DISPOSABLE) ×4 IMPLANT
TOWEL GREEN STERILE FF (TOWEL DISPOSABLE) ×4 IMPLANT
WATER STERILE IRR 1000ML POUR (IV SOLUTION) IMPLANT

## 2021-05-05 NOTE — Addendum Note (Signed)
Addendum  created 05/05/21 1214 by Jonna Munro, CRNA   Charge Capture section accepted

## 2021-05-05 NOTE — Op Note (Signed)
Op report Bilateral Exchange   DATE OF OPERATION: 05/05/2021  LOCATION: [facility] Main Operating Room Outpatient  SURGICAL DIVISION: Plastic Surgery  PREOPERATIVE DIAGNOSIS:  1.History of breast cancer.  2. Acquired absence of bilateral breast.   POSTOPERATIVE DIAGNOSIS:  1. History of breast cancer.  2. Acquired absence of bilateral breast.   PROCEDURE:  1. Bilateral exchange of tissue expanders for implants.  2. Bilateral capsulotomies for implant respositioning.  SURGEON: Cerenity Goshorn Sanger Antonieta Slaven, DO  ASSISTANT: Roetta Sessions, PA  ANESTHESIA:  General.   COMPLICATIONS: None.   IMPLANTS: Left - Mentor Smooth Round High Profile Gel 350 cc.  Right - Mentor Smooth Round Ultra High Profile Gel 400 cc.   INDICATIONS FOR PROCEDURE:  The patient, Patricia Alvarado, is a 75 y.o. female born on Jul 31, 1946, is here for treatment after bilateral mastectomies. The right side was done recently and the left is from previous surgery.  She had a tissue expander placed at the time of mastectomy on the right and had a left breast implant in place from her previous mastectomy. She now presents for exchange of her right expander for implant and exchange of the left implant.  She requires capsulotomies to better position the implants. MRN: [id number]  CONSENT:  Informed consent was obtained directly from the patient. Risks, benefits and alternatives were fully discussed. Specific risks including but not limited to bleeding, infection, hematoma, seroma, scarring, pain, implant infection, implant extrusion, capsular contracture, asymmetry, wound healing problems, and need for further surgery were all discussed. The patient did have an ample opportunity to have her questions answered to her satisfaction.   DESCRIPTION OF PROCEDURE:  The patient was taken to the operating room. SCDs were placed and IV antibiotics were given. The patient's chest was prepped and draped in a sterile fashion. A time out was  performed and the implants to be used were identified.    On the right breast: One percent Lidocaine with epinephrine was used to infiltrate at the incision site. The skin at the inframammary was incised due to the history of radiation.  The bovie was used to dissect to the capsule which was then opened to expose the tissue expander.  The expander was drained and removed.   Inspection of the pocket showed a normal healthy capsule and good integration of the biologic matrix.  The pocket was irrigated with antibiotic solution.  The pocket was opened circumferentially to allow for better expansion.  Hemostasis was achieved with electrocautery.  The pocket laterally opened 4 cm.  This was sutured closed with 3-0 PDS for a final release of 2 cm.  Measurements were made and a sizer used to confirm adequate pocket size for the implant dimensions.  Hemostasis was ensured with electrocautery. New gloves were placed. The implant was soaked in antibiotic solution and then placed in the pocket and oriented appropriately. The keller funnel was used.  The size was compared to the left prior to selecting the final size. The capsule was closed with a 3-0 PDS suture. The remaining layers were closed with 3-0 Monocryl and 4-0 Monocryl subcuticular stitches.   On the left breast: The old mastectomy scar was incised at the lateral aspect.  The mastectomy flaps from the superior and inferior flaps were raised over the pectoralis major muscle for several centimeters to minimize tension for the closure. The pectoralis was split inferior to the skin incision to expose and remove the McGan textured saline implant.  Inspection of the pocket showed a normal healthy capsule.  Superior lateral capsulotomy was performed to allow for breast pocket expansion.  Measurements were made and a sizer utilized to confirm adequate pocket size for the implant dimensions.  Hemostasis was ensured with the electrocautery.  New gloves were applied. The  implant was soaked in antibiotic solution and placed in the pocket and oriented appropriately. The capsule was closed with a 3-0 PDS suture. The remaining layers were closed with 3-0 Monocryl and 4-0 Monocryl subcuticular stitches.  Dermabond was applied to the incision site. A breast binder and ABDs were placed.  The patient was allowed to wake from anesthesia and taken to the recovery room in satisfactory condition.   The advanced practice practitioner (APP) assisted throughout the case.  The APP was essential in retraction and counter traction when needed to make the case progress smoothly.  This retraction and assistance made it possible to see the tissue plans for the procedure.  The assistance was needed for blood control, tissue re-approximation and assisted with closure of the incision site.

## 2021-05-05 NOTE — Anesthesia Postprocedure Evaluation (Signed)
Anesthesia Post Note  Patient: Patricia Alvarado  Procedure(s) Performed: REMOVAL OF TISSUE EXPANDER AND PLACEMENT OF IMPLANT (Right: Breast) BREAST IMPLANT EXCHANGE (Left: Breast)     Patient location during evaluation: PACU Anesthesia Type: General Level of consciousness: awake and alert, oriented and awake Pain management: pain level controlled Vital Signs Assessment: post-procedure vital signs reviewed and stable Respiratory status: spontaneous breathing, nonlabored ventilation and respiratory function stable Cardiovascular status: blood pressure returned to baseline and stable Postop Assessment: no apparent nausea or vomiting Anesthetic complications: no   No notable events documented.  Last Vitals:  Vitals:   05/05/21 1015 05/05/21 1030  BP: (!) 167/55 (!) 166/67  Pulse: 67 64  Resp: 14 (!) 23  Temp:  36.5 C  SpO2: 99% 99%    Last Pain:  Vitals:   05/05/21 1030  TempSrc:   PainSc: 0-No pain                 Catalina Gravel

## 2021-05-05 NOTE — Interval H&P Note (Signed)
History and Physical Interval Note:  05/05/2021 7:08 AM  Patricia Alvarado  has presented today for surgery, with the diagnosis of Status post right mastectomy, Malignant neoplasm of central portion of right breast in female, estrogen receptor positive.  The various methods of treatment have been discussed with the patient and family. After consideration of risks, benefits and other options for treatment, the patient has consented to  Procedure(s): REMOVAL OF TISSUE EXPANDER AND PLACEMENT OF IMPLANT (Right) BREAST IMPLANT EXCHANGE (Left) as a surgical intervention.  The patient's history has been reviewed, patient examined, no change in status, stable for surgery.  I have reviewed the patient's chart and labs.  Questions were answered to the patient's satisfaction.     Patricia Alvarado

## 2021-05-05 NOTE — Discharge Instructions (Addendum)
INSTRUCTIONS FOR AFTER SURGERY   You will likely have some questions about what to expect following your operation.  The following information will help you and your family understand what to expect when you are discharged from the hospital.  Following these guidelines will help ensure a smooth recovery and reduce risks of complications.  Postoperative instructions include information on: diet, wound care, medications and physical activity.  AFTER SURGERY Expect to go home after the procedure.  In some cases, you may need to spend one night in the hospital for observation.  DIET This surgery does not require a specific diet.  However, I have to mention that the healthier you eat the better your body can start healing. It is important to increasing your protein intake.  This means limiting the foods with added sugar.  Focus on fruits and vegetables and some meat. It is very important to drink water after your surgery.  If your urine is bright yellow, then it is concentrated, and you need to drink more water.  As a general rule after surgery, you should have 8 ounces of water every hour while awake.  If you find you are persistently nauseated or unable to take in liquids let us know.  NO TOBACCO USE or EXPOSURE.  This will slow your healing process and increase the risk of a wound.  WOUND CARE You can shower the day after surgery.  Use fragrance free soap.  Dial, [place], [place] and Cetaphil are usually mild on the skin.  If you have steri-strips / tape directly attached to your skin leave them in place. It is OK to get these wet.  No baths, pools or hot tubs for two weeks. We close your incision to leave the smallest and best-looking scar. No ointment or creams on your incisions until given the go ahead.  Especially not Neosporin (Too many skin reactions with this one).  A few weeks after surgery you can use Mederma and start massaging the scar. We ask you to wear your binder or sports bra for the first 6  weeks around the clock, including while sleeping. This provides added comfort and helps reduce the fluid accumulation at the surgery site.  ACTIVITY No heavy lifting until cleared by the doctor.  It is OK to walk and climb stairs. In fact, moving your legs is very important to decrease your risk of a blood clot.  It will also help keep you from getting deconditioned.  Every 1 to 2 hours get up and walk for 5 minutes. This will help with a quicker recovery back to normal.  Let pain be your guide so you don't do too much.  NO, you cannot do the spring cleaning and don't plan on taking care of anyone else.  This is your time for TLC.   WORK Everyone returns to work at different times. As a rough guide, most people take at least 1 - 2 weeks off prior to returning to work. If you need documentation for your job, bring the forms to your postoperative follow up visit.  DRIVING Arrange for someone to bring you home from the hospital.  You may be able to drive a few days after surgery but not while taking any narcotics or valium.  BOWEL MOVEMENTS Constipation can occur after anesthesia and while taking pain medication.  It is important to stay ahead for your comfort.  We recommend taking Milk of Magnesia (2 tablespoons; twice a day) while taking the pain pills.  SEROMA This  is fluid your body tried to put in the surgical site.  This is normal but if it creates excessive pain and swelling let us know.  It usually decreases in a few weeks.  MEDICATIONS and PAIN CONTROL At your preoperative visit for you history and physical you were given the following medications: An antibiotic: Start this medication when you get home and take according to the instructions on the bottle. Zofran 4 mg:  This is to treat nausea and vomiting.  You can take this every 6 hours as needed and only if needed. Norco (hydrocodone/acetaminophen) 5/325 mg:  This is only to be used after you have taken the motrin or the tylenol. Every  8 hours as needed. Over the counter Medication to take: Ibuprofen (Motrin) 600 mg:  Take this every 6 hours.  If you have additional pain then take 500 mg of the tylenol.  Only take the Norco after you have tried these two. Miralax or stool softener of choice: Take this according to the bottle if you take the [place] Call your surgeon's office if any of the following occur:  Fever 101 degrees F or greater  Excessive bleeding or fluid from the incision site.  Pain that increases over time without aid from the medications  Redness, warmth, or pus draining from incision sites  Persistent nausea or inability to take in liquids  Severe misshapen area that underwent the operation.

## 2021-05-05 NOTE — Transfer of Care (Signed)
Immediate Anesthesia Transfer of Care Note  Patient: Patricia Alvarado  Procedure(s) Performed: REMOVAL OF TISSUE EXPANDER AND PLACEMENT OF IMPLANT (Right: Breast) BREAST IMPLANT EXCHANGE (Left: Breast)  Patient Location: PACU  Anesthesia Type:General  Level of Consciousness: awake, alert , oriented and patient cooperative  Airway & Oxygen Therapy: Patient Spontanous Breathing  Post-op Assessment: Report given to RN, Post -op Vital signs reviewed and stable and Patient moving all extremities X 4  Post vital signs: Reviewed and stable  Last Vitals:  Vitals Value Taken Time  BP 173/57 05/05/21 0930  Temp 36.4 C 05/05/21 0930  Pulse 79 05/05/21 0932  Resp 20 05/05/21 0932  SpO2 99 % 05/05/21 0932  Vitals shown include unvalidated device data.  Last Pain:  Vitals:   05/05/21 0813  TempSrc:   PainSc: 0-No pain         Complications: No notable events documented.

## 2021-05-05 NOTE — Anesthesia Procedure Notes (Signed)
Procedure Name: Intubation Date/Time: 05/05/2021 7:33 AM Performed by: Jonna Munro, CRNA Pre-anesthesia Checklist: Patient identified, Emergency Drugs available, Suction available, Patient being monitored and Timeout performed Patient Re-evaluated:Patient Re-evaluated prior to induction Oxygen Delivery Method: Circle system utilized Preoxygenation: Pre-oxygenation with 100% oxygen Induction Type: IV induction Ventilation: Mask ventilation without difficulty Laryngoscope Size: Glidescope, 3 and Mac Grade View: Grade I Tube type: Oral Tube size: 7.0 mm Number of attempts: 1 Airway Equipment and Method: Stylet and Video-laryngoscopy Placement Confirmation: ETT inserted through vocal cords under direct vision, positive ETCO2 and breath sounds checked- equal and bilateral Secured at: 21 cm Tube secured with: Tape Dental Injury: Teeth and Oropharynx as per pre-operative assessment

## 2021-05-06 ENCOUNTER — Encounter (HOSPITAL_COMMUNITY): Payer: Self-pay | Admitting: Plastic Surgery

## 2021-05-13 ENCOUNTER — Other Ambulatory Visit: Payer: Self-pay

## 2021-05-13 ENCOUNTER — Ambulatory Visit (INDEPENDENT_AMBULATORY_CARE_PROVIDER_SITE_OTHER): Payer: PPO | Admitting: Plastic Surgery

## 2021-05-13 ENCOUNTER — Encounter: Payer: Self-pay | Admitting: Plastic Surgery

## 2021-05-13 DIAGNOSIS — Z170 Estrogen receptor positive status [ER+]: Secondary | ICD-10-CM

## 2021-05-13 DIAGNOSIS — Z9011 Acquired absence of right breast and nipple: Secondary | ICD-10-CM

## 2021-05-13 DIAGNOSIS — C50111 Malignant neoplasm of central portion of right female breast: Secondary | ICD-10-CM

## 2021-05-13 NOTE — Progress Notes (Signed)
Patient is a 75 year old female here for follow-up after undergoing removal of her expanders and placement of implants.  She has a left Mentor smooth round high-profile gel 350 cc implant.  She has a right Mentor smooth round ultrahigh profile 400 cc implant.  There is still some swelling.  The incisions are healing nicely.  No sign of seroma or infection.  I would like to see her back in a couple of weeks.  She asked about removal of the left nipple areolar complex.  We expressed that this is possible she but she would need to have a much smaller implant.  She does not want to do that right now and that is certainly reasonable.  She may need some fat filling in the future but for right now she is really doing very well from her surgery.

## 2021-05-29 DIAGNOSIS — C50111 Malignant neoplasm of central portion of right female breast: Secondary | ICD-10-CM | POA: Diagnosis not present

## 2021-05-29 DIAGNOSIS — Z170 Estrogen receptor positive status [ER+]: Secondary | ICD-10-CM | POA: Diagnosis not present

## 2021-06-03 ENCOUNTER — Other Ambulatory Visit: Payer: Self-pay

## 2021-06-03 ENCOUNTER — Ambulatory Visit (INDEPENDENT_AMBULATORY_CARE_PROVIDER_SITE_OTHER): Payer: PPO | Admitting: Surgical

## 2021-06-03 DIAGNOSIS — Z9011 Acquired absence of right breast and nipple: Secondary | ICD-10-CM

## 2021-06-03 DIAGNOSIS — Z170 Estrogen receptor positive status [ER+]: Secondary | ICD-10-CM

## 2021-06-03 DIAGNOSIS — C50111 Malignant neoplasm of central portion of right female breast: Secondary | ICD-10-CM

## 2021-06-03 NOTE — Progress Notes (Signed)
Patient is a 75 year old female here for follow-up after undergoing removal of her bilateral breast expanders and placement of bilateral breast implants with Dr. Marla Roe on 05/05/2021.  She is 1 month postop.  Of note she does have a history of radiation to the right breast. Patient reports that overall she is doing well, she is not having any infectious symptoms.  She has some questions about next steps.  Patient reports that she saw general surgery to discuss excision of left nipple areolar complex.  She reports she is concerned for recurrent cancer as this is occurred in the past in the right nipple areola.  Chaperone present on exam On exam left breast incision is intact, left NAC is viable.  There is some slight tension on the left NAC which is causing some shape change.  On exam right breast incision is intact.  No erythema of either breast noted.  No subcutaneous fluid collections noted.  There is some contour abnormalities of the right breast with some volume loss in the superior pole and medially.  The right breast skin is firm, likely related to radiation changes.  We discussed fat grafting options, discussed ideally wait 3 to 6 months after exchange to implants.  All of her questions were answered related to this.  Recommend following up in 2 months for reevaluation.  No sign of infection, seroma, hematoma.  Pictures were taken and placed in the patient's chart with patient's permission.

## 2021-06-23 DIAGNOSIS — Z6825 Body mass index (BMI) 25.0-25.9, adult: Secondary | ICD-10-CM | POA: Diagnosis not present

## 2021-06-23 DIAGNOSIS — I480 Paroxysmal atrial fibrillation: Secondary | ICD-10-CM | POA: Diagnosis not present

## 2021-06-23 DIAGNOSIS — Z853 Personal history of malignant neoplasm of breast: Secondary | ICD-10-CM | POA: Diagnosis not present

## 2021-06-23 DIAGNOSIS — R7309 Other abnormal glucose: Secondary | ICD-10-CM | POA: Diagnosis not present

## 2021-06-23 DIAGNOSIS — Z79899 Other long term (current) drug therapy: Secondary | ICD-10-CM | POA: Diagnosis not present

## 2021-06-23 DIAGNOSIS — M797 Fibromyalgia: Secondary | ICD-10-CM | POA: Diagnosis not present

## 2021-06-23 DIAGNOSIS — E785 Hyperlipidemia, unspecified: Secondary | ICD-10-CM | POA: Diagnosis not present

## 2021-06-23 DIAGNOSIS — E039 Hypothyroidism, unspecified: Secondary | ICD-10-CM | POA: Diagnosis not present

## 2021-06-23 DIAGNOSIS — I10 Essential (primary) hypertension: Secondary | ICD-10-CM | POA: Diagnosis not present

## 2021-06-23 DIAGNOSIS — G4700 Insomnia, unspecified: Secondary | ICD-10-CM | POA: Diagnosis not present

## 2021-06-23 DIAGNOSIS — R5381 Other malaise: Secondary | ICD-10-CM | POA: Diagnosis not present

## 2021-06-23 DIAGNOSIS — F330 Major depressive disorder, recurrent, mild: Secondary | ICD-10-CM | POA: Diagnosis not present

## 2021-07-02 ENCOUNTER — Other Ambulatory Visit: Payer: Self-pay

## 2021-07-02 DIAGNOSIS — I4729 Other ventricular tachycardia: Secondary | ICD-10-CM

## 2021-07-02 DIAGNOSIS — I472 Ventricular tachycardia: Secondary | ICD-10-CM

## 2021-07-02 MED ORDER — FLECAINIDE ACETATE 100 MG PO TABS: ORAL_TABLET | ORAL | 1 refills | Status: DC

## 2021-07-02 MED ORDER — DILTIAZEM HCL ER COATED BEADS 120 MG PO CP24: 120.0000 mg | ORAL_CAPSULE | Freq: Every evening | ORAL | 1 refills | Status: DC

## 2021-07-11 ENCOUNTER — Ambulatory Visit: Payer: PPO | Admitting: Internal Medicine

## 2021-07-11 ENCOUNTER — Other Ambulatory Visit: Payer: Self-pay

## 2021-07-11 VITALS — BP 146/70 | HR 75 | Ht 64.5 in | Wt 150.8 lb

## 2021-07-11 DIAGNOSIS — I480 Paroxysmal atrial fibrillation: Secondary | ICD-10-CM

## 2021-07-11 NOTE — Patient Instructions (Signed)
Medication Instructions:  Your physician recommends that you continue on your current medications as directed. Please refer to the Current Medication list given to you today.  *If you need a refill on your cardiac medications before your next appointment, please call your pharmacy*   Lab Work: None ordered   Testing/Procedures: None ordered   Follow-Up:  You have been referred to Dr. Curt Bears for discussion of AFib ablation     Thank you for choosing [facility]!!   [phone]

## 2021-07-11 NOTE — Progress Notes (Signed)
HPI Patricia Alvarado returns today for followup. She is a pleasant 75 yo woman with  H/o NSVT, HTN, and PVC's. She has developed recurrent breast CA in the interim. She also had atrial fib. She has done well on flecainide. She does c/o dyspnea with exertion and fatigue. She has had a couple of break throughs of atrial fib despite flecainide. She is symptomatic. She thinks it is getting worse.  Allergies  Allergen Reactions   Quinidine Palpitations    REACTION: increased heart rate   Dofetilide Other (See Comments)    Elevated QTCs   Vicodin  [Hydrocodone-Acetaminophen] Swelling    Facial redness and swelling   Amoxicillin Itching and Rash   Atenolol Hives   Atorvastatin Other (See Comments)    REACTION: muscles pain   Latex Rash    Raw, rash and blisters - Tape only   Nadolol Hives     Current Outpatient Medications  Medication Sig Dispense Refill   acetaminophen (TYLENOL) 500 MG tablet Take 500-1,000 mg by mouth every 6 (six) hours as needed for mild pain or moderate pain.     ALPRAZolam (XANAX) 0.5 MG tablet Take 0.25 mg by mouth at bedtime.     apixaban (ELIQUIS) 5 MG TABS tablet Take 1 tablet (5 mg total) by mouth 2 (two) times daily. 180 tablet 3   Ascorbic Acid (VITAMIN C) 1000 MG tablet Take 1,000 mg by mouth daily.     aspirin 81 MG EC tablet Take 81 mg by mouth daily.     B Complex-C (SUPER B COMPLEX PO) Take 1 capsule by mouth at bedtime.     beta carotene 10000 UNIT capsule Take 10,000 Units by mouth daily.     Biotin 5 MG CAPS Take 5 mg by mouth at bedtime.      Calcium 600-200 MG-UNIT tablet Take 1 tablet by mouth daily.     cetirizine (ZYRTEC) 10 MG tablet Take 10 mg by mouth at bedtime.     Cholecalciferol (VITAMIN D) 125 MCG (5000 UT) CAPS Take 5,000 Units by mouth daily.     diltiazem (CARDIZEM CD) 120 MG 24 hr capsule Take 1 capsule (120 mg total) by mouth at bedtime. 90 capsule 1   diphenhydrAMINE (BENADRYL) 25 MG tablet Take 25 mg by mouth every 6 (six)  hours as needed for allergies.     docusate sodium (COLACE) 100 MG capsule Take 200 mg by mouth daily.     escitalopram (LEXAPRO) 20 MG tablet Take 20 mg by mouth at bedtime.     ezetimibe (ZETIA) 10 MG tablet Take 10 mg by mouth daily.     famotidine (PEPCID) 20 MG tablet Take 20 mg by mouth as needed for heartburn or indigestion.     flecainide (TAMBOCOR) 100 MG tablet Take 1 tablet by mouth twice a day and may take an additional tablet daily for increased palpitations 225 tablet 1   fluticasone (FLONASE) 50 MCG/ACT nasal spray Place 1 spray into both nostrils daily as needed for allergies.     furosemide (LASIX) 20 MG tablet Take 20 mg by mouth daily as needed for fluid (1-2lb weight gain overnight).     magnesium hydroxide (MILK OF MAGNESIA) 400 MG/5ML suspension Take 15 mLs by mouth daily as needed for mild constipation.     MAGNESIUM MALATE PO Take 1 tablet by mouth daily.     MANGANESE PO Take 40 mg by mouth at bedtime.     Melatonin 10 MG TABS  Take 10 mg by mouth at bedtime.      omeprazole (PRILOSEC) 20 MG capsule Take 20 mg by mouth daily as needed (heartburn).     OVER THE COUNTER MEDICATION Take 1 capsule by mouth daily. Co Q10 100 mg with K2     ranolazine (RANEXA) 500 MG 12 hr tablet Take 1 tablet (500 mg total) by mouth 2 (two) times daily. 180 tablet 2   Simethicone 125 MG CAPS Take 250 mg by mouth daily as needed (bloating).     Thiamine HCl (VITAMIN B1) 100 MG TABS Take 100 mg by mouth at bedtime.     thyroid (ARMOUR) 120 MG tablet Take 120 mg by mouth daily before breakfast.     Zinc 22.5 MG TABS Take 22.5 mg by mouth daily.     No current facility-administered medications for this visit.     Past Medical History:  Diagnosis Date   A-fib [facility])    Arthritis    thumb   Basal cell carcinoma 2001   "forehead, between eyebrows" right arm (2022)   Breast cancer ([place])    Carcinoma of thyroid gland ([place]) 2001   Chronic lower back pain    "worse is across my hips"  (05/26/2016)   Depression    Dyslipidemia    Dyspnea    Family history of breast cancer    Family history of kidney cancer    Family history of leukemia    Family history of nonmelanoma skin cancer    Fibrocystic breast    Fibromyalgia    GERD (gastroesophageal reflux disease)    Heart murmur    History of blood transfusion 1992   "after subcutaneous mastectomies"   Hypertension    not on any medications   Hypothyroidism    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   Past Surgical History:  Procedure Laterality Date   ABDOMINAL HYSTERECTOMY  1998   ANTERIOR CERVICAL DECOMP/DISCECTOMY FUSION  2001   BACK SURGERY     BASAL CELL CARCINOMA EXCISION  2001   "cut it out & did a flap, on forehead between my eyebrows"   BREAST IMPLANT EXCHANGE Bilateral 2001   [id number]   BREAST IMPLANT EXCHANGE Left 05/05/2021   Procedure: BREAST IMPLANT EXCHANGE;  Surgeon: Wallace Going, DO;  Location: [place];  Service: Plastics;  Laterality: Left;   BREAST IMPLANT REMOVAL Right 02/05/2020   Procedure: REMOVAL RIGHT BREAST IMPLANT AND CAPSULECTOMY;  Surgeon: Jovita Kussmaul, MD;  Location: [place];  Service: General;  Laterality: Right;   BREAST LUMPECTOMY Right 02/05/2020   Procedure: RIGHT BREAST CENTRAL LUMPECTOMY;  Surgeon: Jovita Kussmaul, MD;  Location: [place];  Service: General;  Laterality: Right;   BREAST RECONSTRUCTION WITH PLACEMENT OF TISSUE EXPANDER AND FLEX HD (ACELLULAR HYDRATED DERMIS) Right 01/20/2021   Procedure: BREAST RECONSTRUCTION WITH PLACEMENT OF TISSUE EXPANDER AND FLEX HD (ACELLULAR HYDRATED DERMIS);  Surgeon: Wallace Going, DO;  Location: [place];  Service:  Plastics;  Laterality: Right;  2 hours   CARPAL TUNNEL RELEASE Right 2006   COLONOSCOPY     DILATION AND CURETTAGE OF UTERUS  1970s X 2-3  ELECTROPHYSIOLOGIC STUDY  1994 X 2;2001   "to see it it was sustained VT; cause thyroid levels were causing arrhythmias"   EYE SURGERY  12/2020   cataracts   LAPAROSCOPIC CHOLECYSTECTOMY     MASTECTOMY Bilateral 1992   "subcutaneous"   MELANOMA EXCISION Left 2001   "ankle, stage I"   PLACEMENT OF BREAST IMPLANTS Bilateral 1992   [id number]   REMOVAL OF TISSUE EXPANDER AND PLACEMENT OF IMPLANT Right 05/05/2021   Procedure: REMOVAL OF TISSUE EXPANDER AND PLACEMENT OF IMPLANT;  Surgeon: Wallace Going, DO;  Location: [place];  Service: Plastics;  Laterality: Right;   TOTAL THYROIDECTOMY  12/1999   "cancer"   VENTRICULAR ABLATION SURGERY  2011   ventricular tchycardia     Family History  Problem Relation Age of Onset   Other Brother        AGENT ORANGE and AntiLupus   Heart disease Brother        Stents and bypass x2   Arrhythmia Brother        AFIB   Heart attack Brother    Hypertension Brother    Squamous cell carcinoma Brother 43       Skin   Hyperlipidemia Mother 2   Osteoporosis Mother    Basal cell carcinoma Mother 1   Heart disease Father 20   Other Father        Cardiac arrest   Breast cancer Maternal Grandmother 85       metastatic   Breast cancer Other        dx. unknown age; maternal great-aunt   Lung cancer Maternal Aunt 61       hx. of smoking   Other Paternal Aunt        bilateral mastectomies due to multiple breast lumps   Leukemia Maternal Aunt    Cancer Maternal Aunt        unknown types   Thyroid cancer Neg Hx      Social History   Socioeconomic History   Marital status: Married    Spouse name: Tour manager (Spouse)   Number of children: Not on file   Years of education: Not on file   Highest education level: Not on file  Occupational History   Occupation: Financial controller GI    Employer: [employer]   Tobacco Use   Smoking status: Never   Smokeless tobacco: Never  Vaping Use   Vaping Use: Never used  Substance and Sexual Activity   Alcohol use: No   Drug use: No   Sexual activity: Yes  Other Topics Concern   Not on file  Social History Narrative   Married   Social Determinants of Health   Financial Resource Strain: Not on file  Food Insecurity: Not on file  Transportation Needs: Not on file  Physical Activity: Not on file  Stress: Not on file  Social Connections: Not on file  Intimate Partner Violence: Not on file     BP (!) 146/70   Pulse 75   Ht 5' 4.5" (1.638 m)   Wt 150 lb 12.8 oz (68.4 kg)   SpO2 98%   BMI 25.49 kg/m   Physical Exam:  Well appearing NAD HEENT: Unremarkable Neck:  No JVD, no thyromegally Lymphatics:  No adenopathy Back:  No CVA tenderness Lungs:  Clear with no wheezes HEART:  Regular rate rhythm, no murmurs, no rubs, no clicks Abd:  soft, positive bowel sounds, no organomegally, no rebound, no guarding Ext:  2 plus pulses, no edema,  no cyanosis, no clubbing Skin:  No rashes no nodules Neuro:  CN II through XII intact, motor grossly intact  EKG - NSR with PVC's   Assess/Plan:  1. PAF - she is maintaining NSR on flecainide but her symptoms appear to be worsening. I have recommended she see Dr. Curt Bears to consider catheter ablation. Her atrial size is normal.  2. PVC's - she appears to have minimal PVC's on flecainide 3. Dyspnea - unclear if she has developed any LV dysfunction by echo. I have asked her to undergo 2D echo.  4. HTN - her bp is mildly elevated. We will not agressively try to lower it further.   Carleene Overlie Helane Briceno,MD

## 2021-07-22 DIAGNOSIS — D225 Melanocytic nevi of trunk: Secondary | ICD-10-CM | POA: Diagnosis not present

## 2021-07-22 DIAGNOSIS — D1801 Hemangioma of skin and subcutaneous tissue: Secondary | ICD-10-CM | POA: Diagnosis not present

## 2021-07-22 DIAGNOSIS — D2239 Melanocytic nevi of other parts of face: Secondary | ICD-10-CM | POA: Diagnosis not present

## 2021-07-22 DIAGNOSIS — Z85820 Personal history of malignant melanoma of skin: Secondary | ICD-10-CM | POA: Diagnosis not present

## 2021-08-06 ENCOUNTER — Ambulatory Visit (INDEPENDENT_AMBULATORY_CARE_PROVIDER_SITE_OTHER): Payer: PPO | Admitting: Surgical

## 2021-08-06 ENCOUNTER — Other Ambulatory Visit: Payer: Self-pay

## 2021-08-06 ENCOUNTER — Encounter: Payer: Self-pay | Admitting: Surgical

## 2021-08-06 DIAGNOSIS — Z170 Estrogen receptor positive status [ER+]: Secondary | ICD-10-CM

## 2021-08-06 DIAGNOSIS — C50111 Malignant neoplasm of central portion of right female breast: Secondary | ICD-10-CM | POA: Diagnosis not present

## 2021-08-06 DIAGNOSIS — Z9011 Acquired absence of right breast and nipple: Secondary | ICD-10-CM | POA: Diagnosis not present

## 2021-08-06 NOTE — Progress Notes (Signed)
   Subjective:     Patient ID: Patricia Alvarado, female    DOB: 1946/01/14, 75 y.o.   MRN: [id number]  Chief Complaint  Patient presents with   Follow-up    HPI: Patient is a 75 year old female here for follow-up on her bilateral breast reconstruction.  She is 3 months postop.   In her left breast: Mentor smooth round high-profile gel 350 cc In her right breast: Mentor smooth round ultrahigh profile gel 400 cc  Patient reports overall she is doing well.  She does note that she feels as if the right breast is a cup size smaller and would like to discuss options for improving this.  She feels as if the implant is falling to the side and into her armpit.  She does have radiation history to this right breast.  She reports that she may need a cardiac ablation in the near future and is scheduled to see cardiology about this tomorrow.  Review of Systems  Constitutional: Negative.   Respiratory: Negative.    Cardiovascular: Negative.   Skin: Negative.     Objective:   Vital Signs There were no vitals taken for this visit. Vital Signs and Nursing Note Reviewed Physical Exam Constitutional:      General: She is not in acute distress.    Appearance: Normal appearance. She is normal weight. She is not ill-appearing.  HENT:     Head: Normocephalic and atraumatic.  Chest:     Comments: Bilateral breast incisions are intact and well-healed.  There is no erythema or cellulitic changes noted.  She does have some volume loss along the medial and superior poles of the right breast.  Implant is slightly lateral.  Bilateral implants are soft, capsules are soft. Neurological:     Mental Status: She is alert.      Assessment/Plan:     ICD-10-CM   1. S/P mastectomy, right  Z90.11     2. Malignant neoplasm of central portion of right breast in female, estrogen receptor positive ([place])  C50.111    Z25.7       75 year old female status post bilateral breast reconstruction with placement of  tissue expanders and subsequently followed by implant placement.  She is bothered by volume loss and cup size difference of the right breast versus left breast.  We discussed options for improving volume and volume loss in the right breast.  We discussed that with her upcoming cardiac appointments it would be best to hold off on any plans for surgical intervention and revisit this after completing any cardiac work-up.  Patient was in agreement with this.  I discussed with her that I would like her to see Dr. Marla Roe for evaluation to determine options for surgical planning.  Patient was agreeable to this as well.  Recommend calling with questions or concerns. Follow up scheduled for 2 to 3 months.   Carola Rhine Travante Knee, PA-C 08/06/2021, 1:20 PM

## 2021-08-07 ENCOUNTER — Ambulatory Visit: Payer: PPO | Admitting: Cardiology

## 2021-08-07 ENCOUNTER — Encounter: Payer: Self-pay | Admitting: Cardiology

## 2021-08-07 VITALS — BP 142/88 | HR 79 | Ht 64.5 in | Wt 150.6 lb

## 2021-08-07 DIAGNOSIS — I480 Paroxysmal atrial fibrillation: Secondary | ICD-10-CM

## 2021-08-07 DIAGNOSIS — Z01812 Encounter for preprocedural laboratory examination: Secondary | ICD-10-CM

## 2021-08-07 DIAGNOSIS — Z01818 Encounter for other preprocedural examination: Secondary | ICD-10-CM | POA: Diagnosis not present

## 2021-08-07 MED ORDER — METOPROLOL TARTRATE 100 MG PO TABS: ORAL_TABLET | ORAL | 0 refills | Status: DC

## 2021-08-07 NOTE — Addendum Note (Signed)
Addended by: Stanton Kidney on: 08/07/2021 11:28 AM   Modules accepted: Orders

## 2021-08-07 NOTE — Progress Notes (Addendum)
Electrophysiology Office Note   Date:  08/07/2021   ID:  Patricia, Alvarado 1945-11-21, MRN [id number]  PCP:  Patricia Dandy, NP  Cardiologist:   Primary Electrophysiologist: Patricia Alken, MD    Chief Complaint: AF   History of Present Illness: Patricia Alvarado is a 75 y.o. female who is being seen today for the evaluation of AF at the request of [name], [name], NP. Presenting today for electrophysiology evaluation.  She has a history significant for hypertension, PVCs, and NSVT.  She also has atrial fibrillation.  She is currently on flecainide.  She has had a few breakthrough episodes of atrial fibrillation despite her flecainide.  Today, she denies symptoms of palpitations, chest pain, shortness of breath, orthopnea, PND, lower extremity edema, claudication, dizziness, presyncope, syncope, bleeding, or neurologic sequela. The patient is tolerating medications without difficulties.  She has weakness and fatigue as well as shortness of breath associated with her atrial fibrillation.  She has tried many medications that have not controlled her arrhythmia.  She would like to stay in normal rhythm.   Past Medical History:  Diagnosis Date   A-fib [facility])    Arthritis    thumb   Basal cell carcinoma 2001   "forehead, between eyebrows" right arm (2022)   Breast cancer ([place])    Carcinoma of thyroid gland ([place]) 2001   Chronic lower back pain    "worse is across my hips" (05/26/2016)   Depression    Dyslipidemia    Dyspnea    Family history of breast cancer    Family history of kidney cancer    Family history of leukemia    Family history of nonmelanoma skin cancer    Fibrocystic breast    Fibromyalgia    GERD (gastroesophageal reflux disease)    Heart murmur    History of blood transfusion 1992   "after subcutaneous mastectomies"   Hypertension    not on any medications   Hypothyroidism    Malignant melanoma of left ankle ([place]) 2001   Migraine    "visual; 2-3  times/year" (05/26/2016)   Mitral valve prolapse    PONV (postoperative nausea and vomiting)    Ventricular tachycardia    Hx of, controlled on sotalol therapy   Past Surgical History:  Procedure Laterality Date   ABDOMINAL HYSTERECTOMY  1998   ANTERIOR CERVICAL DECOMP/DISCECTOMY FUSION  2001   BACK SURGERY     BASAL CELL CARCINOMA EXCISION  2001   "cut it out & did a flap, on forehead between my eyebrows"   BREAST IMPLANT EXCHANGE Bilateral 2001   [id number]   BREAST IMPLANT EXCHANGE Left 05/05/2021   Procedure: BREAST IMPLANT EXCHANGE;  Surgeon: Wallace Going, DO;  Location: [place];  Service: Plastics;  Laterality: Left;   BREAST IMPLANT REMOVAL Right 02/05/2020   Procedure: REMOVAL RIGHT BREAST IMPLANT AND CAPSULECTOMY;  Surgeon: Jovita Kussmaul, MD;  Location: [place];  Service: General;  Laterality: Right;   BREAST LUMPECTOMY Right 02/05/2020   Procedure: RIGHT BREAST CENTRAL LUMPECTOMY;  Surgeon: Jovita Kussmaul, MD;  Location: [place];  Service: General;  Laterality: Right;   BREAST RECONSTRUCTION WITH PLACEMENT OF TISSUE EXPANDER AND FLEX HD (ACELLULAR HYDRATED DERMIS) Right 01/20/2021   Procedure: BREAST RECONSTRUCTION WITH PLACEMENT OF TISSUE EXPANDER AND FLEX HD (ACELLULAR HYDRATED DERMIS);  Surgeon: Wallace Going, DO;  Location: [place];  Service: Plastics;  Laterality: Right;  2 hours   CARPAL TUNNEL  RELEASE Right 2006   COLONOSCOPY     DILATION AND CURETTAGE OF UTERUS  1970s X 2-3   ELECTROPHYSIOLOGIC STUDY  1994 X 2;2001   "to see it it was sustained VT; cause thyroid levels were causing arrhythmias"   EYE SURGERY  12/2020   cataracts   LAPAROSCOPIC CHOLECYSTECTOMY     MASTECTOMY Bilateral 1992   "subcutaneous"   MELANOMA EXCISION Left 2001   "ankle, stage I"   PLACEMENT OF BREAST IMPLANTS Bilateral 1992   [id number]   REMOVAL OF TISSUE EXPANDER AND PLACEMENT OF IMPLANT Right 05/05/2021   Procedure: REMOVAL OF TISSUE EXPANDER  AND PLACEMENT OF IMPLANT;  Surgeon: Wallace Going, DO;  Location: [place];  Service: Plastics;  Laterality: Right;   TOTAL THYROIDECTOMY  12/1999   "cancer"   VENTRICULAR ABLATION SURGERY  2011   ventricular tchycardia     Current Outpatient Medications  Medication Sig Dispense Refill   acetaminophen (TYLENOL) 500 MG tablet Take 500-1,000 mg by mouth every 6 (six) hours as needed for mild pain or moderate pain.     ALPRAZolam (XANAX) 0.5 MG tablet Take 0.25 mg by mouth at bedtime.     apixaban (ELIQUIS) 5 MG TABS tablet Take 1 tablet (5 mg total) by mouth 2 (two) times daily. 180 tablet 3   Ascorbic Acid (VITAMIN C) 1000 MG tablet Take 1,000 mg by mouth daily.     aspirin 81 MG EC tablet Take 81 mg by mouth daily.     B Complex-C (SUPER B COMPLEX PO) Take 1 capsule by mouth at bedtime.     beta carotene 10000 UNIT capsule Take 10,000 Units by mouth daily.     Biotin 5 MG CAPS Take 5 mg by mouth at bedtime.      Calcium 600-200 MG-UNIT tablet Take 1 tablet by mouth daily.     cetirizine (ZYRTEC) 10 MG tablet Take 10 mg by mouth at bedtime.     Cholecalciferol (VITAMIN D) 125 MCG (5000 UT) CAPS Take 5,000 Units by mouth daily.     diltiazem (CARDIZEM CD) 120 MG 24 hr capsule Take 1 capsule (120 mg total) by mouth at bedtime. 90 capsule 1   diphenhydrAMINE (BENADRYL) 25 MG tablet Take 25 mg by mouth every 6 (six) hours as needed for allergies.     docusate sodium (COLACE) 100 MG capsule Take 200 mg by mouth daily.     escitalopram (LEXAPRO) 20 MG tablet Take 20 mg by mouth at bedtime.     ezetimibe (ZETIA) 10 MG tablet Take 10 mg by mouth daily.     famotidine (PEPCID) 20 MG tablet Take 20 mg by mouth as needed for heartburn or indigestion.     flecainide (TAMBOCOR) 100 MG tablet Take 1 tablet by mouth twice a day and may take an additional tablet daily for increased palpitations 225 tablet 1   fluticasone (FLONASE) 50 MCG/ACT nasal spray Place 1 spray into both nostrils daily as needed  for allergies.     furosemide (LASIX) 20 MG tablet Take 20 mg by mouth daily as needed for fluid (1-2lb weight gain overnight).     magnesium hydroxide (MILK OF MAGNESIA) 400 MG/5ML suspension Take 15 mLs by mouth daily as needed for mild constipation.     MAGNESIUM MALATE PO Take 1 tablet by mouth daily.     MANGANESE PO Take 40 mg by mouth at bedtime.     Melatonin 10 MG TABS Take 10 mg by mouth at bedtime.  metoprolol tartrate (LOPRESSOR) 100 MG tablet Take 1 tablet two hours prior to CT testing 1 tablet 0   omeprazole (PRILOSEC) 20 MG capsule Take 20 mg by mouth daily as needed (heartburn).     OVER THE COUNTER MEDICATION Take 1 capsule by mouth daily. Co Q10 100 mg with K2     ranolazine (RANEXA) 500 MG 12 hr tablet Take 1 tablet (500 mg total) by mouth 2 (two) times daily. 180 tablet 2   Simethicone 125 MG CAPS Take 250 mg by mouth daily as needed (bloating).     Thiamine HCl (VITAMIN B1) 100 MG TABS Take 100 mg by mouth at bedtime.     thyroid (ARMOUR) 120 MG tablet Take 120 mg by mouth daily before breakfast.     Zinc 22.5 MG TABS Take 22.5 mg by mouth daily.     No current facility-administered medications for this visit.    Allergies:   Quinidine, Dofetilide, Vicodin  [hydrocodone-acetaminophen], Amoxicillin, Atenolol, Atorvastatin, Latex, and Nadolol   Social History:  The patient  reports that she has never smoked. She has never used smokeless tobacco. She reports that she does not drink alcohol and does not use drugs.   Family History:  The patient's family history includes Arrhythmia in her brother; Basal cell carcinoma (age of onset: 92) in her mother; Breast cancer in an other family member; Breast cancer (age of onset: 37) in her maternal grandmother; Cancer in her maternal aunt; Heart attack in her brother; Heart disease in her brother; Heart disease (age of onset: 52) in her father; Hyperlipidemia (age of onset: 84) in her mother; Hypertension in her brother; Leukemia in  her maternal aunt; Lung cancer (age of onset: 68) in her maternal aunt; Osteoporosis in her mother; Other in her brother, father, and paternal aunt; Squamous cell carcinoma (age of onset: 58) in her brother.    ROS:  Please see the history of present illness.   Otherwise, review of systems is positive for none.   All other systems are reviewed and negative.    PHYSICAL EXAM: VS:  BP (!) 142/88   Pulse 79   Ht 5' 4.5" (1.638 m)   Wt 150 lb 9.6 oz (68.3 kg)   SpO2 98%   BMI 25.45 kg/m  , BMI Body mass index is 25.45 kg/m. GEN: Well nourished, well developed, in no acute distress  HEENT: normal  Neck: no JVD, carotid bruits, or masses Cardiac: RRR; no murmurs, rubs, or gallops,no edema  Respiratory:  clear to auscultation bilaterally, normal work of breathing GI: soft, nontender, nondistended, + BS MS: no deformity or atrophy  Skin: warm and dry Neuro:  Strength and sensation are intact Psych: euthymic mood, full affect  EKG:  EKG is ordered today. Personal review of the ekg ordered shows sinus rhythm, PVC  Recent Labs: 04/30/2021: BUN 20; Creatinine, Ser 0.75; Hemoglobin 13.2; Platelets 164; Potassium 3.8; Sodium 137    Lipid Panel  No results found for: CHOL, TRIG, HDL, CHOLHDL, VLDL, LDLCALC, LDLDIRECT   Wt Readings from Last 3 Encounters:  08/07/21 150 lb 9.6 oz (68.3 kg)  07/11/21 150 lb 12.8 oz (68.4 kg)  05/05/21 150 lb (68 kg)      Other studies Reviewed: Additional studies/ records that were reviewed today include: TTE 9/14 /21 Review of the above records today demonstrates: .  1. Left ventricular ejection fraction, by estimation, is 55 to 60%. The  left ventricle has normal function. The left ventricle has no regional  wall motion abnormalities. Left ventricular diastolic parameters are  consistent with Grade I diastolic  dysfunction (impaired relaxation).   2. Right ventricular systolic function is normal. The right ventricular  size is normal. There is  normal pulmonary artery systolic pressure. The  estimated right ventricular systolic pressure is 38.7 mmHg.   3. P2 prolpase with late systolic MR that is mild. There is mitral  annular disjunction noted which is associated with PVCs. 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 Mild mitral valve regurgitation.   4. The aortic valve is tricuspid. Aortic valve regurgitation is mild. No  aortic stenosis is present.   5. The inferior vena cava is normal in size with greater than 50%  respiratory variability, suggesting right atrial pressure of 3 mmHg.   6. Evidence of atrial level shunting detected by color flow Doppler.  There is a small patent foramen ovale with predominantly left to right  shunting across the atrial septum.   ASSESSMENT AND PLAN:  1.  Paroxysmal atrial fibrillation: Currently on flecainide 100 mg twice daily, diltiazem 120 mg daily, Eliquis 5 mg twice daily.  CHA2DS2-VASc of 2.  She is unfortunately continued to have episodes of atrial fibrillation despite her flecainide.  Due to that, she would likely benefit from ablation.  We have discussed alternatives to ablation, but she feels that ablation is the best option for her.  Risk, benefits, and alternatives to EP study and radiofrequency ablation for afib were also discussed in detail today. These risks include but are not limited to stroke, bleeding, vascular damage, tamponade, perforation, damage to the esophagus, lungs, and other structures, pulmonary vein stenosis, worsening renal function, and death. The patient understands these risk and wishes to proceed.  We Ciria Bernardini therefore proceed with catheter ablation at the next available time.  Carto, ICE, anesthesia are requested for the procedure.  Loriann Bosserman also obtain CT PV protocol prior to the procedure to exclude LAA thrombus and further evaluate atrial anatomy.  2.  PVCs: Currently on flecainide with minimal PVCs.  3.  Hypertension: Has been well controlled at home.  No changes.  Case  discussed with primary cardiology   Current medicines are reviewed at length with the patient today.   The patient does not have concerns regarding her medicines.  The following changes were made today:  none  Labs/ tests ordered today include:  Orders Placed This Encounter  Procedures   CT CARDIAC MORPH/PULM VEIN W/CM&W/O CA SCORE   Basic metabolic panel   CBC   EKG 12-Lead     Disposition:   FU with Keyshon Stein 3 months  Signed, Nahiem Dredge Meredith Leeds, MD  08/07/2021 11:54 AM     [address] [place] [address] [phone] (office) [phone] (fax)

## 2021-08-07 NOTE — Addendum Note (Signed)
Addended by: Stanton Kidney on: 08/07/2021 11:47 AM   Modules accepted: Orders

## 2021-08-07 NOTE — Patient Instructions (Signed)
Medication Instructions:  Your physician recommends that you continue on your current medications as directed. Please refer to the Current Medication list given to you today.  *If you need a refill on your cardiac medications before your next appointment, please call your pharmacy*   Lab Work: Pre procedure labs between 10/24 - 11/04 in the [place] office:  BMP & CBC You do NOT need to be fasting.  Avoid going during lunchtime hours.  If you have labs (blood work) drawn today and your tests are completely normal, you will receive your results only by: [place] (if you have MyChart) OR A paper copy in the mail If you have any lab test that is abnormal or we need to change your treatment, we will call you to review the results.   Testing/Procedures: Your physician has requested that you have cardiac CT within 7 days PRIOR to your ablation. Cardiac computed tomography (CT) is a painless test that uses an x-ray machine to take clear, detailed pictures of your heart.  Please follow instruction below located under "other instructions". You will get a call from our office to schedule the date for this test.  Your physician has recommended that you have an ablation. Catheter ablation is a medical procedure used to treat some cardiac arrhythmias (irregular heartbeats). During catheter ablation, a long, thin, flexible tube is put into a blood vessel in your groin (upper thigh), or neck. This tube is called an ablation catheter. It is then guided to your heart through the blood vessel. Radio frequency waves destroy small areas of heart tissue where abnormal heartbeats may cause an arrhythmia to start. Please follow instruction below located under "other instructions".   Follow-Up: At [facility], you and your health needs are our priority.  As part of our continuing mission to provide you with exceptional heart care, we have created designated Provider Care Teams.  These Care Teams include  your primary Cardiologist (physician) and Advanced Practice Providers (APPs -  Physician Assistants and Nurse Practitioners) who all work together to provide you with the care you need, when you need it.  Your next appointment:   1 month(s) after your ablation  The format for your next appointment:   In Person  Provider:   AFib clinic   Thank you for choosing [facility]!!   Trinidad Curet, RN [phone]    Other Instructions   CT INSTRUCTIONS  Your cardiac CT will be scheduled at:  [facility] [address] [place], [place] [address] [phone]  Please arrive at the [facility] main entrance (entrance A) of [facility] 30 minutes prior to test start time. Proceed to the [facility] Radiology Department (first floor) to check-in and test prep.   Please follow these instructions carefully (unless otherwise directed):   On the Night Before the Test: Be sure to Drink plenty of water. Do not consume any caffeinated/decaffeinated beverages or chocolate 12 hours prior to your test. Do not take any antihistamines 12 hours prior to your test.  On the Day of the Test: Drink plenty of water until 1 hour prior to the test. Do not eat any food 4 hours prior to the test. You may take your regular medications prior to the test.  Take metoprolol (Lopressor) 100 mg two hours prior to test. HOLD Furosemide/Hydrochlorothiazide morning of the test. FEMALES- please wear underwire-free bra if available       After the Test: Drink plenty of water. After receiving IV contrast,  you may experience a mild flushed feeling. This is normal. On occasion, you may experience a mild rash up to 24 hours after the test. This is not dangerous. If this occurs, you can take Benadryl 25 mg and increase your fluid intake. If you experience trouble breathing, this can be serious. If it is severe call 911 IMMEDIATELY. If it is mild, please call our office. If you take any  of these medications: Glipizide/Metformin, Avandament, Glucavance, please do not take 48 hours after completing test unless otherwise instructed.   Once we have confirmed authorization from your insurance company, we will call you to set up a date and time for your test. Based on how quickly your insurance processes prior authorizations requests, please allow up to 4 weeks to be contacted for scheduling your Cardiac CT appointment. Be advised that routine Cardiac CT appointments could be scheduled as many as 8 weeks after your provider has ordered it.  For non-scheduling related questions, please contact the cardiac imaging nurse navigator should you have any questions/concerns: Marchia Bond, Cardiac Imaging Nurse Navigator Gordy Clement, Cardiac Imaging Nurse Navigator [facility] Direct Office Dial: [phone]   For scheduling needs, including cancellations and rescheduling, please call [place], [phone].     Electrophysiology/Ablation Procedure Instructions   You are scheduled for a(n)  ablation on 09/12/2021 with Dr. Allegra Lai.   1.   Pre procedure testing-             A.  LAB WORK --- between 10/24 - 11/04 for your pre procedure blood work.     On the day of your procedure 09/12/2021 you will go to [facility] [phone]) at 8:30 am.  Dennis Bast will go to the main entrance A [employer]) and enter where the [employer] are.  Your driver will drop you off and you will head down the hallway to ADMITTING.  You may have one support person come in to the hospital with you.  They will be asked to wait in the waiting room. It is OK to have someone drop you off and come back when you are ready to be discharged.   3.   Do not eat or drink after midnight prior to your procedure.   4.   On the morning of your procedure do NOT take any medication. Do not miss any doses of your blood thinner prior to the morning of your procedure or your  procedure will need to be rescheduled.   5.  Plan for an overnight stay but you may be discharged after your procedure, if you use your phone frequently bring your phone charger. If you are discharged after your procedure you will need someone to drive you home and be with you for 24 hours after your procedure.   6. You will follow up with the AFIB clinic 4 weeks after your procedure.  You will follow up with Dr. Curt Bears  3 months after your procedure.  These appointments will be made for you.   7. FYI: For your safety, and to allow [place] to monitor your vital signs accurately during the surgery/procedure we request that if you have artificial nails, gel coating, SNS etc. Please have those removed prior to your surgery/procedure. Not having the nail coverings /polish removed may result in cancellation or delay of your surgery/procedure.  * If you have ANY questions please call the office [phone] and ask for Ahmari Duerson RN or send me a MyChart  message   * Occasionally, EP Studies and ablations can become lengthy.  Please make your family aware of this before your procedure starts.  Average time ranges from 2-8 hours for EP studies/ablations.  Your physician will call your family after the procedure with the results.                                   Cardiac Ablation Cardiac ablation is a procedure to destroy (ablate) some heart tissue that is sending bad signals. These bad signals cause problems in heart rhythm. The heart has many areas that make these signals. If there are problems in these areas, they can make the heart beat in a way that is not normal. Destroying some tissues can help make the heart rhythm normal. Tell your doctor about: Any allergies you have. All medicines you are taking. These include vitamins, herbs, eye drops, creams, and over-the-counter medicines. Any problems you or family members have had with medicines that make you fall asleep (anesthetics). Any blood disorders you  have. Any surgeries you have had. Any medical conditions you have, such as kidney failure. Whether you are pregnant or may be pregnant. What are the risks? This is a safe procedure. But problems may occur, including: Infection. Bruising and bleeding. Bleeding into the chest. Stroke or blood clots. Damage to nearby areas of your body. Allergies to medicines or dyes. The need for a pacemaker if the normal system is damaged. Failure of the procedure to treat the problem. What happens before the procedure? Medicines Ask your doctor about: Changing or stopping your normal medicines. This is important. Taking aspirin and ibuprofen. Do not take these medicines unless your doctor tells you to take them. Taking other medicines, vitamins, herbs, and supplements. General instructions Follow instructions from your doctor about what you cannot eat or drink. Plan to have someone take you home from the hospital or clinic. If you will be going home right after the procedure, plan to have someone with you for 24 hours. Ask your doctor what steps will be taken to prevent infection. What happens during the procedure?  An IV tube will be put into one of your veins. You will be given a medicine to help you relax. The skin on your neck or groin will be numbed. A cut (incision) will be made in your neck or groin. A needle will be put through your cut and into a large vein. A tube (catheter) will be put into the needle. The tube will be moved to your heart. Dye may be put through the tube. This helps your doctor see your heart. Small devices (electrodes) on the tube will send out signals. A type of energy will be used to destroy some heart tissue. The tube will be taken out. Pressure will be held on your cut. This helps stop bleeding. A bandage will be put over your cut. The exact procedure may vary among doctors and hospitals. What happens after the procedure? You will be watched until you leave the  hospital or clinic. This includes checking your heart rate, breathing rate, oxygen, and blood pressure. Your cut will be watched for bleeding. You will need to lie still for a few hours. Do not drive for 24 hours or as long as your doctor tells you. Summary Cardiac ablation is a procedure to destroy some heart tissue. This is done to treat heart rhythm problems. Tell your doctor about  any medical conditions you may have. Tell him or her about all medicines you are taking to treat them. This is a safe procedure. But problems may occur. These include infection, bruising, bleeding, and damage to nearby areas of your body. Follow what your doctor tells you about food and drink. You may also be told to change or stop some of your medicines. After the procedure, do not drive for 24 hours or as long as your doctor tells you. This information is not intended to replace advice given to you by your health care provider. Make sure you discuss any questions you have with your health care provider. Document Revised: 09/14/2019 Document Reviewed: 09/14/2019 Elsevier Patient Education  2022 [employer].

## 2021-08-19 DIAGNOSIS — I480 Paroxysmal atrial fibrillation: Secondary | ICD-10-CM | POA: Diagnosis not present

## 2021-08-19 DIAGNOSIS — Z01812 Encounter for preprocedural laboratory examination: Secondary | ICD-10-CM | POA: Diagnosis not present

## 2021-08-20 LAB — BASIC METABOLIC PANEL
BUN/Creatinine Ratio: 24 (ref 12–28)
BUN: 20 mg/dL (ref 8–27)
CO2: 24 mmol/L (ref 20–29)
Calcium: 9.8 mg/dL (ref 8.7–10.3)
Chloride: 104 mmol/L (ref 96–106)
Creatinine, Ser: 0.85 mg/dL (ref 0.57–1.00)
Glucose: 125 mg/dL — ABNORMAL HIGH (ref 70–99)
Potassium: 4.1 mmol/L (ref 3.5–5.2)
Sodium: 144 mmol/L (ref 134–144)
eGFR: 72 mL/min/{1.73_m2} (ref >59)

## 2021-08-20 LAB — CBC
Hematocrit: 41.2 % (ref 34.0–46.6)
Hemoglobin: 13.8 g/dL (ref 11.1–15.9)
MCH: 31.4 pg (ref 26.6–33.0)
MCHC: 33.5 g/dL (ref 31.5–35.7)
MCV: 94 fL (ref 79–97)
Platelets: 204 10*3/uL (ref 150–450)
RBC: 4.39 x10E6/uL (ref 3.77–5.28)
RDW: 12.1 % (ref 11.7–15.4)
WBC: 3.8 10*3/uL (ref 3.4–10.8)

## 2021-08-22 ENCOUNTER — Other Ambulatory Visit: Payer: Self-pay

## 2021-08-22 ENCOUNTER — Ambulatory Visit: Payer: PPO | Admitting: Pulmonary Disease

## 2021-08-22 ENCOUNTER — Encounter: Payer: Self-pay | Admitting: Pulmonary Disease

## 2021-08-22 VITALS — BP 126/68 | HR 78 | Temp 97.7°F | Ht 64.0 in | Wt 151.6 lb

## 2021-08-22 DIAGNOSIS — J84112 Idiopathic pulmonary fibrosis: Secondary | ICD-10-CM | POA: Diagnosis not present

## 2021-08-22 NOTE — Progress Notes (Signed)
Patricia Alvarado    [id number]    1945-12-12  Primary Care Physician:Schultz, Lora Havens, MD  Referring Physician: Gardenia Phlegm, NP [place],  [place] [address]  Chief complaint:   Shortness of breath Abnormal CT scan  HPI: Has noticed some worsening shortness of breath -She is unsure whether it is related to the atrial fibrillation or worsening of the pulmonary fibrosis  Recent atrial fibrillation with rapid ventricular rhythm  She is scheduled for an intervention soon Cardiac CT scheduled for November  Last year-diagnosis of breast cancer for which she had radiation surgery  Right breast lumpectomy 02/05/2020 Radiation treatment between 03/13/2020 to 04/23/2020-Notes reviewed  Admits to shortness of breath with higher grade moderate to severe exertion Able to function well  Appointment was made basically to follow-up on CT scan findings which showed inflammatory changes in the right upper lobe and small pleural effusion versus pleural thickening on the right, calcified left diaphragm process  Recent echocardiogram was within normal limit  She did have Covid 08/15/2019 -Had a PET scan post when she had Covid that did not reveal any inflammatory process  Retired Marine scientist Non-smoker   Outpatient Encounter Medications as of 08/15/2020  Medication Sig   acetaminophen (TYLENOL) 500 MG tablet Take 500-1,000 mg by mouth every 6 (six) hours as needed for mild pain or moderate pain.   ALPRAZolam (XANAX) 0.5 MG tablet Take 0.5 mg by mouth at bedtime.   apixaban (ELIQUIS) 5 MG TABS tablet Take 1 tablet (5 mg total) by mouth 2 (two) times daily.   Ascorbic Acid (VITAMIN C) 1000 MG tablet Take 1 tablet by mouth daily.   aspirin 81 MG EC tablet Take 81 mg by mouth daily.     B Complex-C (SUPER B COMPLEX PO) Take 1 capsule by mouth at bedtime.   Biotin 5 MG CAPS Take 5 mg by mouth at bedtime.    bisacodyl (DULCOLAX) 5 MG EC tablet Take 5 mg by  mouth daily.   Calcium 600-200 MG-UNIT tablet Take 1 tablet by mouth daily.   cetirizine (ZYRTEC) 10 MG tablet Take 10 mg by mouth daily as needed for allergies.    Coenzyme Q10 (COQ10) 100 MG CAPS Take 100 mg by mouth daily.    diltiazem (CARDIZEM CD) 120 MG 24 hr capsule Take 1 capsule (120 mg total) by mouth daily.   escitalopram (LEXAPRO) 20 MG tablet Take 20 mg by mouth daily.     flecainide (TAMBOCOR) 100 MG tablet Take 1 tablet by mouth twice a day and may take an additional tablet daily for increased palpitations   fluticasone (FLONASE) 50 MCG/ACT nasal spray as needed.   furosemide (LASIX) 20 MG tablet Take 1 tablet (20 mg total) by mouth daily.   MAGNESIUM MALATE PO Take 1 tablet by mouth daily.   MANGANESE PO Take 40 mg by mouth at bedtime.   Melatonin 10 MG TABS Take 10 mg by mouth at bedtime.    ranolazine (RANEXA) 500 MG 12 hr tablet Take 1 tablet (500 mg total) by mouth 2 (two) times daily.   Thiamine Mononitrate (VITAMIN B1 PO) Take 1 tablet by mouth at bedtime. 100 mg   thyroid (ARMOUR) 120 MG tablet Take 120 mg by mouth daily before breakfast.   VITAMIN D, CHOLECALCIFEROL, PO Take 5,000 Units by mouth daily.   Zinc 50 MG TABS Take 1 tablet by mouth daily.   [DISCONTINUED] diltiazem (DILT-XR) 120 MG 24 hr  capsule Take 1 capsule (120 mg total) by mouth daily.   No facility-administered encounter medications on file as of 08/15/2020.    Allergies as of 08/15/2020 - Review Complete 08/15/2020  Allergen Reaction Noted   Quinidine Other (See Comments)    Dofetilide Other (See Comments) 05/29/2016   Vicodin  [hydrocodone-acetaminophen]  03/05/2020   Amoxicillin Itching and Rash    Atenolol Hives    Atorvastatin Other (See Comments)    Latex Other (See Comments) 05/26/2016   Nadolol Hives     Past Medical History:  Diagnosis Date   Basal cell carcinoma 2001   "forehead, between eyebrows"   Carcinoma of thyroid gland ([facility]) 2001   Chronic lower back pain    "worse is  across my hips" (05/26/2016)   Depression    Dyslipidemia    Family history of breast cancer    Family history of kidney cancer    Family history of leukemia    Family history of nonmelanoma skin cancer    Fibrocystic breast    Fibromyalgia    Heart murmur    History of blood transfusion 1992   "after subcutaneous mastectomies"   Hypothyroidism    Malignant melanoma of left ankle ([facility]) 2001   Migraine    "visual; 2-3 times/year" (05/26/2016)   Mitral valve prolapse    PONV (postoperative nausea and vomiting)    Ventricular tachycardia (HCC)    Hx of, controlled on sotalol therapy    Past Surgical History:  Procedure Laterality Date   Adrian DECOMP/DISCECTOMY FUSION  2001   BACK SURGERY     BASAL CELL CARCINOMA EXCISION  2001   "cut it out & did a flap, on forehead between my eyebrows"   BREAST IMPLANT EXCHANGE Bilateral 2001   [id number]   BREAST IMPLANT REMOVAL Right 02/05/2020   Procedure: REMOVAL RIGHT BREAST IMPLANT AND CAPSULECTOMY;  Surgeon: Jovita Kussmaul, MD;  Location: [facility];  Service: General;  Laterality: Right;   BREAST LUMPECTOMY Right 02/05/2020   Procedure: RIGHT BREAST CENTRAL LUMPECTOMY;  Surgeon: Jovita Kussmaul, MD;  Location: [facility];  Service: General;  Laterality: Right;   CARPAL TUNNEL RELEASE Right 2006   COLONOSCOPY     DILATION AND CURETTAGE OF UTERUS  1970s X 2-3   ELECTROPHYSIOLOGIC STUDY  1994 X 2;2001   "to see it it was sustained VT; cause thyroid levels were causing arrhythmias"   LAPAROSCOPIC CHOLECYSTECTOMY     MASTECTOMY Bilateral 1992   "subcutaneous"   MELANOMA EXCISION Left 2001   "ankle, stage I"   PLACEMENT OF BREAST IMPLANTS Bilateral 1992   [id number]   TOTAL THYROIDECTOMY  12/1999   "cancer"   VENTRICULAR ABLATION SURGERY  2011   ventricular tchycardia    Family History  Problem Relation Age of Onset   Other Brother        AGENT ORANGE and AntiLupus    Heart disease Brother        Stents and bypass x2   Arrhythmia Brother        AFIB   Heart attack Brother    Hypertension Brother    Squamous cell carcinoma Brother 38       Skin   Hyperlipidemia Mother 58   Osteoporosis Mother    Basal cell carcinoma Mother 103   Heart disease Father 45   Other Father        Cardiac arrest   Breast cancer Maternal Grandmother  11       metastatic   Breast cancer Other        dx. unknown age; maternal great-aunt   Lung cancer Maternal Aunt 75       hx. of smoking   Other Paternal Aunt        bilateral mastectomies due to multiple breast lumps   Leukemia Maternal Aunt    Cancer Maternal Aunt        unknown types   Thyroid cancer Neg Hx     Social History   Socioeconomic History   Marital status: Married    Spouse name: Not on file   Number of children: Not on file   Years of education: Not on file   Highest education level: Not on file  Occupational History   Occupation: Financial controller GI    Employer: [employer]  Tobacco Use   Smoking status: Never Smoker   Smokeless tobacco: Never Used  Scientific laboratory technician Use: Never used  Substance and Sexual Activity   Alcohol use: No   Drug use: No   Sexual activity: Yes  Other Topics Concern   Not on file  Social History Narrative   Married   Social Determinants of Health   Financial Resource Strain:    Difficulty of Paying Living Expenses: Not on file  Food Insecurity:    Worried About Charity fundraiser in the Last Year: Not on file   [employer] of Food in the Last Year: Not on file  Transportation Needs:    Lack of Transportation (Medical): Not on file   Lack of Transportation (Non-Medical): Not on file  Physical Activity:    Days of Exercise per Week: Not on file   Minutes of Exercise per Session: Not on file  Stress:    Feeling of Stress : Not on file  Social Connections:    Frequency of Communication with Friends and Family: Not on file   Frequency of Social Gatherings with  Friends and Family: Not on file   Attends Religious Services: Not on file   Active Member of Clubs or Organizations: Not on file   Attends Archivist Meetings: Not on file   Marital Status: Not on file  Intimate Partner Violence:    Fear of Current or Ex-Partner: Not on file   Emotionally Abused: Not on file   Physically Abused: Not on file   Sexually Abused: Not on file    Review of Systems  Respiratory:  Positive for shortness of breath.    Vitals:   08/15/20 1124  BP: 134/70  Pulse: 75  Temp: (!) 97.2 F (36.2 C)  SpO2: 98%     Physical Exam Constitutional:      Appearance: Normal appearance.  HENT:     Nose: No congestion or rhinorrhea.  Eyes:     Conjunctiva/sclera: Conjunctivae normal.  Cardiovascular:     Rate and Rhythm: Normal rate and regular rhythm.     Heart sounds: No murmur heard.   No friction rub.  Pulmonary:     Effort: No respiratory distress.     Breath sounds: No stridor. No wheezing or rhonchi.  Musculoskeletal:     Cervical back: No rigidity or tenderness.  Neurological:     Mental Status: She is alert.  Psychiatric:        Mood and Affect: Mood normal.   Data Reviewed: CT scan was reviewed with the patient Recent PET scan reviewed with the patient  Assessment:   Postradiation inflammatory changes  Shortness of breath  Cough  Atrial fibrillation with RVR  Plan/Recommendations:  Clinical monitoring remains appropriate  She is having a CT scan of the chest performed, will request for lung CT to be managed with a cardiac CT that is ordered  Schedule for pulmonary function test  Encouraged to call with any significant concerns  Tentative follow-up in 6 to 8 weeks  Call with any significant concerns    Sherrilyn Rist MD [facility] 08/15/2020, 11:55 AM  CC: Wilber Bihari Cornett*

## 2021-08-22 NOTE — Patient Instructions (Addendum)
.    Will order CT scan of the chest .  Reason for CT is pulmonary fibrosis- -Try and merge it with the cardiac CT that is already ordered   .  Schedule PFT  .  Follow-up in 6 to 8 weeks -We will try and do the PFT on the day of visit  Call with significant concerns

## 2021-09-03 ENCOUNTER — Telehealth (HOSPITAL_COMMUNITY): Payer: Self-pay | Admitting: Emergency Medicine

## 2021-09-03 NOTE — Telephone Encounter (Signed)
Reaching out to patient to offer assistance regarding upcoming cardiac imaging study; pt verbalizes understanding of appt date/time, parking situation and where to check in, pre-test NPO status and medications ordered, and verified current allergies; name and call back number provided for further questions should they arise Marchia Bond RN Navigator Cardiac Imaging [facility] [phone] office [phone] cell  100mg  metoprolol tartrate  Pt getting PV CTA + CT chest WO

## 2021-09-05 ENCOUNTER — Other Ambulatory Visit: Payer: Self-pay

## 2021-09-05 ENCOUNTER — Ambulatory Visit (HOSPITAL_COMMUNITY)
Admission: RE | Admit: 2021-09-05 | Discharge: 2021-09-05 | Disposition: A | Discharge status: Home / Self Care | Payer: PPO | Source: Ambulatory Visit | Attending: Cardiology | Admitting: Cardiology

## 2021-09-05 DIAGNOSIS — I480 Paroxysmal atrial fibrillation: Secondary | ICD-10-CM | POA: Diagnosis not present

## 2021-09-05 DIAGNOSIS — J84112 Idiopathic pulmonary fibrosis: Secondary | ICD-10-CM | POA: Insufficient documentation

## 2021-09-05 DIAGNOSIS — R918 Other nonspecific abnormal finding of lung field: Secondary | ICD-10-CM | POA: Diagnosis not present

## 2021-09-05 DIAGNOSIS — I700 Atherosclerosis of aorta: Secondary | ICD-10-CM | POA: Diagnosis not present

## 2021-09-05 MED ORDER — IOHEXOL 350 MG/ML SOLN
80.0000 mL | Freq: Once | INTRAVENOUS | Status: AC | PRN
  Administered 2021-09-05: 80 mL via INTRAVENOUS

## 2021-09-05 NOTE — Telephone Encounter (Signed)
Pt would like the results of her CT scan please advise.

## 2021-09-09 NOTE — Telephone Encounter (Signed)
CT scan of the chest with postradiation changes  Stable from a year ago

## 2021-09-11 NOTE — Pre-Procedure Instructions (Signed)
Instructed patient on the following items: Arrival time 0830 Nothing to eat or drink after midnight No meds AM of procedure Responsible person to drive you home and stay with you for 24 hrs  Have you missed any doses of anti-coagulant Eliquis- hasn't missed any doses   

## 2021-09-12 ENCOUNTER — Ambulatory Visit (HOSPITAL_COMMUNITY)
Admission: RE | Admit: 2021-09-12 | Discharge: 2021-09-12 | Disposition: A | Discharge status: Home / Self Care | Payer: PPO | Source: Ambulatory Visit | Attending: Cardiology | Admitting: Cardiology

## 2021-09-12 ENCOUNTER — Encounter (HOSPITAL_COMMUNITY)
Admission: RE | Disposition: A | Discharge status: Home / Self Care | Payer: Self-pay | Source: Ambulatory Visit | Attending: Cardiology

## 2021-09-12 ENCOUNTER — Ambulatory Visit (HOSPITAL_COMMUNITY): Payer: PPO | Admitting: Anesthesiology

## 2021-09-12 ENCOUNTER — Other Ambulatory Visit: Payer: Self-pay

## 2021-09-12 DIAGNOSIS — I493 Ventricular premature depolarization: Secondary | ICD-10-CM | POA: Diagnosis not present

## 2021-09-12 DIAGNOSIS — I480 Paroxysmal atrial fibrillation: Secondary | ICD-10-CM | POA: Diagnosis not present

## 2021-09-12 DIAGNOSIS — E785 Hyperlipidemia, unspecified: Secondary | ICD-10-CM | POA: Diagnosis not present

## 2021-09-12 DIAGNOSIS — I10 Essential (primary) hypertension: Secondary | ICD-10-CM | POA: Diagnosis not present

## 2021-09-12 DIAGNOSIS — Z7901 Long term (current) use of anticoagulants: Secondary | ICD-10-CM | POA: Diagnosis not present

## 2021-09-12 DIAGNOSIS — E039 Hypothyroidism, unspecified: Secondary | ICD-10-CM | POA: Diagnosis not present

## 2021-09-12 HISTORY — PX: ATRIAL FIBRILLATION ABLATION: EP1191

## 2021-09-12 SURGERY — ATRIAL FIBRILLATION ABLATION
Anesthesia: General

## 2021-09-12 MED ORDER — ONDANSETRON HCL 4 MG/2ML IJ SOLN: 4.0000 mg | Freq: Once | INTRAMUSCULAR | Status: DC | PRN

## 2021-09-12 MED ORDER — HEPARIN (PORCINE) IN NACL 1000-0.9 UT/500ML-% IV SOLN
INTRAVENOUS | Status: DC | PRN
  Administered 2021-09-12 (×5): 500 mL

## 2021-09-12 MED ORDER — ONDANSETRON HCL 4 MG/2ML IJ SOLN
INTRAMUSCULAR | Status: DC | PRN
  Administered 2021-09-12: 4 mg via INTRAVENOUS

## 2021-09-12 MED ORDER — LIDOCAINE 2% (20 MG/ML) 5 ML SYRINGE
INTRAMUSCULAR | Status: DC | PRN
  Administered 2021-09-12: 20 mg via INTRAVENOUS

## 2021-09-12 MED ORDER — SODIUM CHLORIDE 0.9% FLUSH: 3.0000 mL | INTRAVENOUS | Status: DC | PRN

## 2021-09-12 MED ORDER — DOBUTAMINE INFUSION FOR EP/ECHO/NUC (1000 MCG/ML)
INTRAVENOUS | Status: AC
  Filled 2021-09-12: qty 250

## 2021-09-12 MED ORDER — HEPARIN SODIUM (PORCINE) 1000 UNIT/ML IJ SOLN
INTRAMUSCULAR | Status: DC | PRN
  Administered 2021-09-12: 1000 [IU] via INTRAVENOUS
  Administered 2021-09-12: 14000 [IU] via INTRAVENOUS

## 2021-09-12 MED ORDER — LORAZEPAM 2 MG/ML IJ SOLN
0.5000 mg | Freq: Once | INTRAMUSCULAR | Status: AC
  Administered 2021-09-12: 0.5 mg via INTRAVENOUS

## 2021-09-12 MED ORDER — FENTANYL CITRATE (PF) 100 MCG/2ML IJ SOLN: 25.0000 ug | INTRAMUSCULAR | Status: DC | PRN

## 2021-09-12 MED ORDER — PROTAMINE SULFATE 10 MG/ML IV SOLN
INTRAVENOUS | Status: DC | PRN
  Administered 2021-09-12: 40 mg via INTRAVENOUS

## 2021-09-12 MED ORDER — SUGAMMADEX SODIUM 200 MG/2ML IV SOLN
INTRAVENOUS | Status: DC | PRN
  Administered 2021-09-12: 200 mg via INTRAVENOUS

## 2021-09-12 MED ORDER — PROPOFOL 10 MG/ML IV BOLUS
INTRAVENOUS | Status: DC | PRN
  Administered 2021-09-12: 150 mg via INTRAVENOUS

## 2021-09-12 MED ORDER — OXYCODONE HCL 5 MG PO TABS
5.0000 mg | ORAL_TABLET | Freq: Once | ORAL | Status: AC | PRN
  Administered 2021-09-12: 5 mg via ORAL
  Filled 2021-09-12: qty 1

## 2021-09-12 MED ORDER — DOBUTAMINE INFUSION FOR EP/ECHO/NUC (1000 MCG/ML)
INTRAVENOUS | Status: DC | PRN
  Administered 2021-09-12: 20 ug/kg/min via INTRAVENOUS

## 2021-09-12 MED ORDER — SODIUM CHLORIDE 0.9 % IV SOLN: 250.0000 mL | INTRAVENOUS | Status: DC | PRN

## 2021-09-12 MED ORDER — MEPERIDINE HCL 25 MG/ML IJ SOLN
6.2500 mg | INTRAMUSCULAR | Status: DC | PRN
  Filled 2021-09-12: qty 1

## 2021-09-12 MED ORDER — HEPARIN SODIUM (PORCINE) 1000 UNIT/ML IJ SOLN
INTRAMUSCULAR | Status: DC | PRN
  Administered 2021-09-12: 1000 [IU] via INTRAVENOUS

## 2021-09-12 MED ORDER — LORAZEPAM 2 MG/ML IJ SOLN: 0.5000 mg | Freq: Once | INTRAMUSCULAR | Status: DC

## 2021-09-12 MED ORDER — DEXAMETHASONE SODIUM PHOSPHATE 10 MG/ML IJ SOLN
INTRAMUSCULAR | Status: DC | PRN
  Administered 2021-09-12: 10 mg via INTRAVENOUS

## 2021-09-12 MED ORDER — PHENYLEPHRINE HCL-NACL 20-0.9 MG/250ML-% IV SOLN
INTRAVENOUS | Status: DC | PRN
  Administered 2021-09-12: 25 ug/min via INTRAVENOUS

## 2021-09-12 MED ORDER — SODIUM CHLORIDE 0.9% FLUSH: 3.0000 mL | Freq: Two times a day (BID) | INTRAVENOUS | Status: DC

## 2021-09-12 MED ORDER — OXYCODONE HCL 5 MG PO TABS
ORAL_TABLET | ORAL | Status: AC
  Filled 2021-09-12: qty 1

## 2021-09-12 MED ORDER — ACETAMINOPHEN 160 MG/5ML PO SOLN
325.0000 mg | ORAL | Status: DC | PRN
  Filled 2021-09-12: qty 20.3

## 2021-09-12 MED ORDER — ROCURONIUM BROMIDE 10 MG/ML (PF) SYRINGE
PREFILLED_SYRINGE | INTRAVENOUS | Status: DC | PRN
  Administered 2021-09-12: 20 mg via INTRAVENOUS
  Administered 2021-09-12: 40 mg via INTRAVENOUS

## 2021-09-12 MED ORDER — SODIUM CHLORIDE 0.9 % IV SOLN: INTRAVENOUS | Status: DC

## 2021-09-12 MED ORDER — LORAZEPAM 2 MG/ML IJ SOLN
INTRAMUSCULAR | Status: AC
  Filled 2021-09-12: qty 1

## 2021-09-12 MED ORDER — ACETAMINOPHEN 325 MG PO TABS
325.0000 mg | ORAL_TABLET | ORAL | Status: DC | PRN
  Filled 2021-09-12: qty 2

## 2021-09-12 MED ORDER — FENTANYL CITRATE (PF) 100 MCG/2ML IJ SOLN
INTRAMUSCULAR | Status: DC | PRN
  Administered 2021-09-12: 50 ug via INTRAVENOUS
  Administered 2021-09-12 (×2): 25 ug via INTRAVENOUS

## 2021-09-12 MED ORDER — OXYCODONE HCL 5 MG/5ML PO SOLN
5.0000 mg | Freq: Once | ORAL | Status: AC | PRN
  Filled 2021-09-12: qty 5

## 2021-09-12 MED ORDER — ONDANSETRON HCL 4 MG/2ML IJ SOLN: 4.0000 mg | Freq: Four times a day (QID) | INTRAMUSCULAR | Status: DC | PRN

## 2021-09-12 SURGICAL SUPPLY — 22 items
BAG SNAP BAND KOVER 36X36 (MISCELLANEOUS) ×1 IMPLANT
BLANKET WARM UNDERBOD FULL ACC (MISCELLANEOUS) ×2 IMPLANT
CATH OCTARAY 2.0 F 3-3-3-3-3 (CATHETERS) ×1 IMPLANT
CATH S CIRCA THERM PROBE 10F (CATHETERS) ×1 IMPLANT
CATH SMTCH THERMOCOOL SF DF (CATHETERS) ×1 IMPLANT
CATH SOUNDSTAR ECO 8FR (CATHETERS) ×1 IMPLANT
CATH WEB BI DIR CSDF CRV REPRO (CATHETERS) ×1 IMPLANT
CLOSURE PERCLOSE PROSTYLE (VASCULAR PRODUCTS) ×4 IMPLANT
COVER SWIFTLINK CONNECTOR (BAG) ×2 IMPLANT
PACK EP LATEX FREE (CUSTOM PROCEDURE TRAY) ×2
PACK EP LF (CUSTOM PROCEDURE TRAY) ×1 IMPLANT
PAD DEFIB RADIO PHYSIO CONN (PAD) ×2 IMPLANT
PATCH CARTO3 (PAD) ×1 IMPLANT
SHEATH BAYLIS SUREFLEX  M 8.5 (SHEATH) ×2
SHEATH BAYLIS SUREFLEX M 8.5 (SHEATH) IMPLANT
SHEATH BAYLIS TRANSSEPTAL 98CM (NEEDLE) ×1 IMPLANT
SHEATH CARTO VIZIGO SM CVD (SHEATH) ×1 IMPLANT
SHEATH PINNACLE 7F 10CM (SHEATH) ×1 IMPLANT
SHEATH PINNACLE 8F 10CM (SHEATH) ×2 IMPLANT
SHEATH PINNACLE 9F 10CM (SHEATH) ×1 IMPLANT
SHEATH PROBE COVER 6X72 (BAG) ×1 IMPLANT
TUBING SMART ABLATE COOLFLOW (TUBING) ×1 IMPLANT

## 2021-09-12 NOTE — Discharge Instructions (Signed)

## 2021-09-12 NOTE — Progress Notes (Signed)
Patient states she is feeling much less anxious.

## 2021-09-12 NOTE — Transfer of Care (Signed)
Immediate Anesthesia Transfer of Care Note  Patient: Patricia Alvarado  Procedure(s) Performed: ATRIAL FIBRILLATION ABLATION  Patient Location: Cath Lab  Anesthesia Type:General  Level of Consciousness: awake, alert  and oriented  Airway & Oxygen Therapy: Patient connected to nasal cannula oxygen  Post-op Assessment: Post -op Vital signs reviewed and stable  Post vital signs: stable  Last Vitals:  Vitals Value Taken Time  BP 106/89 09/12/21 1223  Temp    Pulse 87 09/12/21 1224  Resp 21 09/12/21 1224  SpO2 99 % 09/12/21 1224  Vitals shown include unvalidated device data.  Last Pain:  Vitals:   09/12/21 0905  TempSrc:   PainSc: 0-No pain         Complications: There were no known notable events for this encounter.

## 2021-09-12 NOTE — Anesthesia Preprocedure Evaluation (Signed)
Anesthesia Evaluation  Patient identified by MRN, date of birth, ID band Patient awake    Reviewed: Allergy & Precautions, NPO status , Patient's Chart, lab work & pertinent test results  History of Anesthesia Complications (+) PONV and history of anesthetic complications  Airway Mallampati: I  TM Distance: >3 FB Neck ROM: Full    Dental  (+) Teeth Intact, Dental Advisory Given   Pulmonary neg pulmonary ROS,    Pulmonary exam normal breath sounds clear to auscultation       Cardiovascular hypertension, Pt. on medications (-) anginaNormal cardiovascular exam+ dysrhythmias Atrial Fibrillation and Ventricular Tachycardia + Valvular Problems/Murmurs MVP  Rhythm:Regular Rate:Normal  VT (VT ablation 01/21/10; PVCs managed on flecainide), PAF (on flecainide and diltiazem)   Neuro/Psych  Headaches, PSYCHIATRIC DISORDERS Depression C6-7 ACDF  Neuromuscular disease    GI/Hepatic Neg liver ROS, GERD  Medicated,  Endo/Other  Hypothyroidism   Renal/GU negative Renal ROS     Musculoskeletal  (+) Arthritis , Fibromyalgia -  Abdominal   Peds  Hematology negative hematology ROS (+)   Anesthesia Other Findings   Reproductive/Obstetrics                             Anesthesia Physical  Anesthesia Plan  ASA: 3  Anesthesia Plan: General   Post-op Pain Management:    Induction: Intravenous  PONV Risk Score and Plan: 4 or greater and Propofol infusion, Dexamethasone and Ondansetron  Airway Management Planned: Oral ETT  Additional Equipment: None  Intra-op Plan:   Post-operative Plan: Extubation in OR  Informed Consent: I have reviewed the patients History and Physical, chart, labs and discussed the procedure including the risks, benefits and alternatives for the proposed anesthesia with the patient or authorized representative who has indicated his/her understanding and acceptance.     Dental  advisory given  Plan Discussed with: CRNA and Anesthesiologist  Anesthesia Plan Comments: (PAT note written 05/01/2021 by Myra Gianotti, PA-C. )        Anesthesia Quick Evaluation

## 2021-09-12 NOTE — Anesthesia Postprocedure Evaluation (Signed)
Anesthesia Post Note  Patient: Patricia Alvarado  Procedure(s) Performed: ATRIAL FIBRILLATION ABLATION     Patient location during evaluation: PACU Anesthesia Type: General Level of consciousness: awake and alert Pain management: pain level controlled Vital Signs Assessment: post-procedure vital signs reviewed and stable Respiratory status: spontaneous breathing, nonlabored ventilation, respiratory function stable and patient connected to nasal cannula oxygen Cardiovascular status: blood pressure returned to baseline and stable Postop Assessment: no apparent nausea or vomiting Anesthetic complications: no   There were no known notable events for this encounter.  Last Vitals:  Vitals:   09/12/21 1340 09/12/21 1345  BP: (!) 158/57   Pulse: 84 86  Resp: 14 14  Temp:    SpO2: 97% 96%    Last Pain:  Vitals:   09/12/21 1325  TempSrc:   PainSc: 0-No pain                 Azaliyah Kennard

## 2021-09-12 NOTE — Anesthesia Procedure Notes (Addendum)
Procedure Name: Intubation Date/Time: 09/12/2021 10:24 AM Performed by: Lavell Luster, CRNA Pre-anesthesia Checklist: Patient identified, Emergency Drugs available, Suction available, Patient being monitored and Timeout performed Patient Re-evaluated:Patient Re-evaluated prior to induction Oxygen Delivery Method: Circle system utilized Preoxygenation: Pre-oxygenation with 100% oxygen Induction Type: IV induction Ventilation: Mask ventilation without difficulty Laryngoscope Size: Mac, 3 and Glidescope Grade View: Grade I Tube type: Oral Tube size: 7.5 mm Number of attempts: 1 Airway Equipment and Method: Stylet and Video-laryngoscopy Placement Confirmation: breath sounds checked- equal and bilateral, positive ETCO2 and ETT inserted through vocal cords under direct vision Secured at: 21 cm Tube secured with: Tape Dental Injury: Teeth and Oropharynx as per pre-operative assessment  Difficulty Due To: Difficulty was anticipated, Difficult Airway- due to anterior larynx and Difficult Airway-  due to neck instability Comments: Pt with h/o anterior cervical fusion and voiced concerns about neck stabilization.  Videolaryngoscopy planned, smooth, atraumatic induction and intubation noted.  Dr Ambrose Pancoast verified placement.  Henderson Cloud, CRNA

## 2021-09-12 NOTE — H&P (Signed)
Electrophysiology Office Note   Date:  09/12/2021   ID:  Patricia Alvarado, Patricia Alvarado 1946-03-13, MRN [id number]  PCP:  [name], NP  Cardiologist:   Primary Electrophysiologist: Gaye Alken, MD    Chief Complaint: AF   History of Present Illness: Patricia Alvarado is a 75 y.o. female who is being seen today for the evaluation of AF at the request of No ref. provider found. Presenting today for electrophysiology evaluation.  She has a history significant for hypertension, PVCs, and NSVT.  She also has atrial fibrillation.  She is currently on flecainide.  She has had a few breakthrough episodes of atrial fibrillation despite her flecainide.  Today, denies symptoms of palpitations, chest pain, shortness of breath, orthopnea, PND, lower extremity edema, claudication, dizziness, presyncope, syncope, bleeding, or neurologic sequela. The patient is tolerating medications without difficulties. Ablation today.    Past Medical History:  Diagnosis Date   A-fib [facility])    Arthritis    thumb   Basal cell carcinoma 2001   "forehead, between eyebrows" right arm (2022)   Breast cancer ([place])    Carcinoma of thyroid gland ([place]) 2001   Chronic lower back pain    "worse is across my hips" (05/26/2016)   Depression    Dyslipidemia    Dyspnea    Family history of breast cancer    Family history of kidney cancer    Family history of leukemia    Family history of nonmelanoma skin cancer    Fibrocystic breast    Fibromyalgia    GERD (gastroesophageal reflux disease)    Heart murmur    History of blood transfusion 1992   "after subcutaneous mastectomies"   Hypertension    not on any medications   Hypothyroidism    Malignant melanoma of left ankle ([place]) 2001   Migraine    "visual; 2-3 times/year" (05/26/2016)   Mitral valve prolapse    PONV (postoperative nausea and vomiting)    Ventricular tachycardia    Hx of, controlled on sotalol therapy   Past Surgical History:  Procedure  Laterality Date   ABDOMINAL HYSTERECTOMY  1998   ANTERIOR CERVICAL DECOMP/DISCECTOMY FUSION  2001   BACK SURGERY     BASAL CELL CARCINOMA EXCISION  2001   "cut it out & did a flap, on forehead between my eyebrows"   BREAST IMPLANT EXCHANGE Bilateral 2001   [id number]   BREAST IMPLANT EXCHANGE Left 05/05/2021   Procedure: BREAST IMPLANT EXCHANGE;  Surgeon: Wallace Going, DO;  Location: [place];  Service: Plastics;  Laterality: Left;   BREAST IMPLANT REMOVAL Right 02/05/2020   Procedure: REMOVAL RIGHT BREAST IMPLANT AND CAPSULECTOMY;  Surgeon: Jovita Kussmaul, MD;  Location: [place];  Service: General;  Laterality: Right;   BREAST LUMPECTOMY Right 02/05/2020   Procedure: RIGHT BREAST CENTRAL LUMPECTOMY;  Surgeon: Jovita Kussmaul, MD;  Location: [place];  Service: General;  Laterality: Right;   BREAST RECONSTRUCTION WITH PLACEMENT OF TISSUE EXPANDER AND FLEX HD (ACELLULAR HYDRATED DERMIS) Right 01/20/2021   Procedure: BREAST RECONSTRUCTION WITH PLACEMENT OF TISSUE EXPANDER AND FLEX HD (ACELLULAR HYDRATED DERMIS);  Surgeon: Wallace Going, DO;  Location: [place];  Service: Plastics;  Laterality: Right;  2 hours   CARPAL TUNNEL RELEASE Right 2006   COLONOSCOPY     DILATION AND CURETTAGE OF UTERUS  1970s X 2-3   ELECTROPHYSIOLOGIC STUDY  1994 X 2;2001   "to see it it was sustained VT;  cause thyroid levels were causing arrhythmias"   EYE SURGERY  12/2020   cataracts   LAPAROSCOPIC CHOLECYSTECTOMY     MASTECTOMY Bilateral 1992   "subcutaneous"   MELANOMA EXCISION Left 2001   "ankle, stage I"   PLACEMENT OF BREAST IMPLANTS Bilateral 1992   [id number]   REMOVAL OF TISSUE EXPANDER AND PLACEMENT OF IMPLANT Right 05/05/2021   Procedure: REMOVAL OF TISSUE EXPANDER AND PLACEMENT OF IMPLANT;  Surgeon: Wallace Going, DO;  Location: [place];  Service: Plastics;  Laterality: Right;   TOTAL THYROIDECTOMY  12/1999   "cancer"   VENTRICULAR ABLATION SURGERY   2011   ventricular tchycardia     Current Facility-Administered Medications  Medication Dose Route Frequency Provider Last Rate Last Admin   0.9 %  sodium chloride infusion   Intravenous Continuous [place] Peerson Hassell Done, MD        Allergies:   Quinidine, Dofetilide, Vicodin  [hydrocodone-acetaminophen], Amoxicillin, Atenolol, Atorvastatin, Latex, and Nadolol   Social History:  The patient  reports that she has never smoked. She has never used smokeless tobacco. She reports that she does not drink alcohol and does not use drugs.   Family History:  The patient's family history includes Arrhythmia in her brother; Basal cell carcinoma (age of onset: 51) in her mother; Breast cancer in an other family member; Breast cancer (age of onset: 75) in her maternal grandmother; Cancer in her maternal aunt; Heart attack in her brother; Heart disease in her brother; Heart disease (age of onset: 48) in her father; Hyperlipidemia (age of onset: 1) in her mother; Hypertension in her brother; Leukemia in her maternal aunt; Lung cancer (age of onset: 60) in her maternal aunt; Osteoporosis in her mother; Other in her brother, father, and paternal aunt; Squamous cell carcinoma (age of onset: 61) in her brother.   ROS:  Please see the history of present illness.   Otherwise, review of systems is positive for none.   All other systems are reviewed and negative.   PHYSICAL EXAM: VS:  BP (!) 174/69   Pulse 67   Temp 98.1 F (36.7 C) (Oral)   Resp 18   Ht 5' 4.5" (1.638 m)   Wt 68 kg   SpO2 97%   BMI 25.35 kg/m  , BMI Body mass index is 25.35 kg/m. GEN: Well nourished, well developed, in no acute distress  HEENT: normal  Neck: no JVD, carotid bruits, or masses Cardiac: RRR; no murmurs, rubs, or gallops,no edema  Respiratory:  clear to auscultation bilaterally, normal work of breathing GI: soft, nontender, nondistended, + BS MS: no deformity or atrophy  Skin: warm and dry Neuro:  Strength and sensation are  intact Psych: euthymic mood, full affect   Recent Labs: 08/19/2021: BUN 20; Creatinine, Ser 0.85; Hemoglobin 13.8; Platelets 204; Potassium 4.1; Sodium 144    Lipid Panel  No results found for: CHOL, TRIG, HDL, CHOLHDL, VLDL, LDLCALC, LDLDIRECT   Wt Readings from Last 3 Encounters:  09/12/21 68 kg  08/22/21 68.8 kg  08/07/21 68.3 kg      Other studies Reviewed: Additional studies/ records that were reviewed today include: TTE 9/14 /21 Review of the above records today demonstrates: .  1. Left ventricular ejection fraction, by estimation, is 55 to 60%. The  left ventricle has normal function. The left ventricle has no regional  wall motion abnormalities. Left ventricular diastolic parameters are  consistent with Grade I diastolic  dysfunction (impaired relaxation).   2. Right ventricular systolic function is normal.  The right ventricular  size is normal. There is normal pulmonary artery systolic pressure. The  estimated right ventricular systolic pressure is 54.6 mmHg.   3. P2 prolpase with late systolic MR that is mild. There is mitral  annular disjunction noted which is associated with PVCs. 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 Mild mitral valve regurgitation.   4. The aortic valve is tricuspid. Aortic valve regurgitation is mild. No  aortic stenosis is present.   5. The inferior vena cava is normal in size with greater than 50%  respiratory variability, suggesting right atrial pressure of 3 mmHg.   6. Evidence of atrial level shunting detected by color flow Doppler.  There is a small patent foramen ovale with predominantly left to right  shunting across the atrial septum.   ASSESSMENT AND PLAN:  1.  Paroxysmal atrial fibrillation: Patricia Alvarado has presented today for surgery, with the diagnosis of AF.  The various methods of treatment have been discussed with the patient and family. After consideration of risks, benefits and other options for treatment, the patient has  consented to  Procedure(s): Catheter ablation as a surgical intervention .  Risks include but not limited to complete heart block, stroke, esophageal damage, nerve damage, bleeding, vascular damage, tamponade, perforation, MI, and death. The patient's history has been reviewed, patient examined, no change in status, stable for surgery.  I have reviewed the patient's chart and labs.  Questions were answered to the patient's satisfaction.    Neo Yepiz Curt Bears, MD 09/12/2021 9:49 AM

## 2021-09-14 ENCOUNTER — Telehealth: Payer: Self-pay | Admitting: Physician Assistant

## 2021-09-14 MED ORDER — COLCHICINE 0.6 MG PO TABS: 0.6000 mg | ORAL_TABLET | Freq: Every day | ORAL | 0 refills | Status: DC

## 2021-09-14 NOTE — Telephone Encounter (Signed)
   The patient called the answering service after-hours today. Dr. Curt Bears did an atrial fibrillation ablation on 09/12/21. She has continued to have discomfort since that procedure. Yesterday and last night this was discomfort across her shoulder blades. Into today she has had this discomfort under her sternum, neck and jaw. It is worse lying down. She also has some SOB lying down. She took 600mg  ibuprofen around 3am and tried ice without improvement. She took 2 Tramadol with some relief then 3 Tylenol just now. I reviewed with Dr. Curt Bears who recommends course of colchicine for several weeks. He reviewed drug interactions with Ranexa/diltiazem. He recommends stopping Ranexa, colchicine dosed at 0.6mg  once daily, and continuing diltiazem at current dose. The patient verbalized understanding and gratitude. She will notify if symptoms do not improve.   Charlie Pitter, PA-C

## 2021-09-15 ENCOUNTER — Encounter (HOSPITAL_COMMUNITY): Payer: Self-pay | Admitting: Cardiology

## 2021-09-15 LAB — POCT ACTIVATED CLOTTING TIME
Activated Clotting Time: 352 seconds
Activated Clotting Time: 341 seconds

## 2021-09-16 NOTE — Telephone Encounter (Signed)
Pt called with update. Feeling 90% better with the Chest discomfort/jaw pain. States her weight was up about 5lbs over the weekend as well "she peed up a storm" and has returned to pre-procedure weight so her SOB has improved. Pt reassured post ablation recovery she is describing is to be expected. Pt verbalized understanding will follow up as scheduled.

## 2021-09-22 ENCOUNTER — Ambulatory Visit (HOSPITAL_COMMUNITY)
Admission: RE | Admit: 2021-09-22 | Discharge: 2021-09-22 | Disposition: A | Discharge status: Home / Self Care | Payer: PPO | Source: Ambulatory Visit | Attending: Physician Assistant | Admitting: Physician Assistant

## 2021-09-22 VITALS — BP 166/76 | HR 93 | Ht 64.5 in | Wt 151.8 lb

## 2021-09-22 DIAGNOSIS — E785 Hyperlipidemia, unspecified: Secondary | ICD-10-CM | POA: Insufficient documentation

## 2021-09-22 DIAGNOSIS — I480 Paroxysmal atrial fibrillation: Secondary | ICD-10-CM | POA: Insufficient documentation

## 2021-09-22 DIAGNOSIS — Z7901 Long term (current) use of anticoagulants: Secondary | ICD-10-CM | POA: Insufficient documentation

## 2021-09-22 DIAGNOSIS — I10 Essential (primary) hypertension: Secondary | ICD-10-CM | POA: Diagnosis not present

## 2021-09-22 DIAGNOSIS — D6869 Other thrombophilia: Secondary | ICD-10-CM

## 2021-09-22 NOTE — Progress Notes (Signed)
Primary Care Physician: [name], NP Primary Electrophysiologist: Dr Lovena Le Referring Physician: Dr Delfina Redwood is a 75 y.o. female with a history of HTN, breast cancer, HLD, atrial fibrillation who presents for follow up in the [facility]. Patient is on Eliquis for a CHADS2VASC score of 3. She underwent afib ablation with Dr Curt Bears on 09/12/21. She has noticed since that time that her resting heart rate has been higher and her heart rate increases to 100-115 bpm with activity. However, when she palpates her pulse it has been regular. She also had some pleuritic chest pain post ablation which resolved with colchicine. No swallowing pain or groin issues.   Today, she denies symptoms of palpitations, chest pain, shortness of breath, orthopnea, PND, lower extremity edema, dizziness, presyncope, syncope, snoring, daytime somnolence, bleeding, or neurologic sequela. The patient is tolerating medications without difficulties and is otherwise without complaint today.    Atrial Fibrillation Risk Factors:  she does not have symptoms or diagnosis of sleep apnea. she does not have a history of rheumatic fever.   she has a BMI of Body mass index is 25.65 kg/m.Marland Kitchen Filed Weights   09/22/21 1525  Weight: 68.9 kg    Family History  Problem Relation Age of Onset   Other Brother        AGENT ORANGE and AntiLupus   Heart disease Brother        Stents and bypass x2   Arrhythmia Brother        AFIB   Heart attack Brother    Hypertension Brother    Squamous cell carcinoma Brother 51       Skin   Hyperlipidemia Mother 48   Osteoporosis Mother    Basal cell carcinoma Mother 32   Heart disease Father 59   Other Father        Cardiac arrest   Breast cancer Maternal Grandmother 85       metastatic   Breast cancer Other        dx. unknown age; maternal great-aunt   Lung cancer Maternal Aunt 75       hx. of smoking   Other Paternal Aunt         bilateral mastectomies due to multiple breast lumps   Leukemia Maternal Aunt    Cancer Maternal Aunt        unknown types   Thyroid cancer Neg Hx      Atrial Fibrillation Management history:  Previous antiarrhythmic drugs: flecainide  Previous cardioversions: none Previous ablations: 09/12/21 CHADS2VASC score: 3 Anticoagulation history: Eliquis   Past Medical History:  Diagnosis Date   A-fib ([place])    Arthritis    thumb   Basal cell carcinoma 2001   "forehead, between eyebrows" right arm (2022)   Breast cancer ([place])    Carcinoma of thyroid gland ([place]) 2001   Chronic lower back pain    "worse is across my hips" (05/26/2016)   Depression    Dyslipidemia    Dyspnea    Family history of breast cancer    Family history of kidney cancer    Family history of leukemia    Family history of nonmelanoma skin cancer    Fibrocystic breast    Fibromyalgia    GERD (gastroesophageal reflux disease)    Heart murmur    History of blood transfusion 1992   "after subcutaneous mastectomies"   Hypertension    not on any medications   Hypothyroidism  Malignant melanoma of left ankle ([place]) 2001   Migraine    "visual; 2-3 times/year" (05/26/2016)   Mitral valve prolapse    PONV (postoperative nausea and vomiting)    Ventricular tachycardia    Hx of, controlled on sotalol therapy   Past Surgical History:  Procedure Laterality Date   ABDOMINAL HYSTERECTOMY  1998   ANTERIOR CERVICAL DECOMP/DISCECTOMY FUSION  2001   ATRIAL FIBRILLATION ABLATION N/A 09/12/2021   Procedure: ATRIAL FIBRILLATION ABLATION;  Surgeon: Constance Haw, MD;  Location: [facility];  Service: Cardiovascular;  Laterality: N/A;   BACK SURGERY     BASAL CELL CARCINOMA EXCISION  2001   "cut it out & did a flap, on forehead between my eyebrows"   BREAST IMPLANT EXCHANGE Bilateral 2001   [id number]   BREAST IMPLANT EXCHANGE Left 05/05/2021   Procedure: BREAST IMPLANT EXCHANGE;  Surgeon: Wallace Going,  DO;  Location: [place];  Service: Plastics;  Laterality: Left;   BREAST IMPLANT REMOVAL Right 02/05/2020   Procedure: REMOVAL RIGHT BREAST IMPLANT AND CAPSULECTOMY;  Surgeon: Jovita Kussmaul, MD;  Location: [place];  Service: General;  Laterality: Right;   BREAST LUMPECTOMY Right 02/05/2020   Procedure: RIGHT BREAST CENTRAL LUMPECTOMY;  Surgeon: Jovita Kussmaul, MD;  Location: [place];  Service: General;  Laterality: Right;   BREAST RECONSTRUCTION WITH PLACEMENT OF TISSUE EXPANDER AND FLEX HD (ACELLULAR HYDRATED DERMIS) Right 01/20/2021   Procedure: BREAST RECONSTRUCTION WITH PLACEMENT OF TISSUE EXPANDER AND FLEX HD (ACELLULAR HYDRATED DERMIS);  Surgeon: Wallace Going, DO;  Location: [place];  Service: Plastics;  Laterality: Right;  2 hours   CARPAL TUNNEL RELEASE Right 2006   COLONOSCOPY     DILATION AND CURETTAGE OF UTERUS  1970s X 2-3   ELECTROPHYSIOLOGIC STUDY  1994 X 2;2001   "to see it it was sustained VT; cause thyroid levels were causing arrhythmias"   EYE SURGERY  12/2020   cataracts   LAPAROSCOPIC CHOLECYSTECTOMY     MASTECTOMY Bilateral 1992   "subcutaneous"   MELANOMA EXCISION Left 2001   "ankle, stage I"   PLACEMENT OF BREAST IMPLANTS Bilateral 1992   [id number]   REMOVAL OF TISSUE EXPANDER AND PLACEMENT OF IMPLANT Right 05/05/2021   Procedure: REMOVAL OF TISSUE EXPANDER AND PLACEMENT OF IMPLANT;  Surgeon: Wallace Going, DO;  Location: [place];  Service: Plastics;  Laterality: Right;   TOTAL THYROIDECTOMY  12/1999   "cancer"   VENTRICULAR ABLATION SURGERY  2011   ventricular tchycardia    Current Outpatient Medications  Medication Sig Dispense Refill   acetaminophen (TYLENOL) 500 MG tablet Take 500-1,000 mg by mouth every 6 (six) hours as needed for mild pain or moderate pain.     ALPRAZolam (XANAX) 0.5 MG tablet Take 0.25 mg by mouth at bedtime.     apixaban (ELIQUIS) 5 MG TABS tablet Take 1 tablet (5 mg total) by mouth 2 (two)  times daily. 180 tablet 3   Ascorbic Acid (VITAMIN C) 1000 MG tablet Take 1,000 mg by mouth daily.     B Complex-C (SUPER B COMPLEX PO) Take 1 capsule by mouth at bedtime.     beta carotene 10000 UNIT capsule Take 10,000 Units by mouth daily.     Biotin 5 MG CAPS Take 5 mg by mouth at bedtime.      Calcium 600-200 MG-UNIT tablet Take 1 tablet by mouth daily.     cetirizine (ZYRTEC) 10 MG tablet Take 10 mg by  mouth at bedtime.     Cholecalciferol (VITAMIN D) 125 MCG (5000 UT) CAPS Take 5,000 Units by mouth daily.     colchicine 0.6 MG tablet Take 1 tablet (0.6 mg total) by mouth daily. 21 tablet 0   diltiazem (CARDIZEM CD) 120 MG 24 hr capsule Take 1 capsule (120 mg total) by mouth at bedtime. 90 capsule 1   docusate sodium (COLACE) 100 MG capsule Take 200 mg by mouth as needed.     escitalopram (LEXAPRO) 20 MG tablet Take 20 mg by mouth at bedtime.     ezetimibe (ZETIA) 10 MG tablet Take 10 mg by mouth daily.     famotidine (PEPCID) 20 MG tablet Take 20 mg by mouth as needed for heartburn or indigestion.     flecainide (TAMBOCOR) 100 MG tablet Take 1 tablet by mouth twice a day and may take an additional tablet daily for increased palpitations 225 tablet 1   fluticasone (FLONASE) 50 MCG/ACT nasal spray Place 1 spray into both nostrils daily as needed for allergies.     furosemide (LASIX) 20 MG tablet Take 20 mg by mouth daily as needed for fluid (1-2lb weight gain overnight).     magnesium hydroxide (MILK OF MAGNESIA) 400 MG/5ML suspension Take 15 mLs by mouth daily as needed for mild constipation.     MAGNESIUM MALATE PO Take 1 tablet by mouth daily.     MANGANESE PO Take 40 mg by mouth at bedtime.     Melatonin 10 MG TABS Take 10 mg by mouth at bedtime.      omeprazole (PRILOSEC) 20 MG capsule Take 20 mg by mouth daily as needed (heartburn).     OVER THE COUNTER MEDICATION Take 1 capsule by mouth daily. Co Q10 100 mg with K2     Polyvinyl Alcohol (LIQUID TEARS OP) Place 1 drop into both eyes  daily as needed (dry eyes).     Simethicone 125 MG CAPS Take 250 mg by mouth daily as needed (bloating).     Thiamine HCl (VITAMIN B1) 100 MG TABS Take 100 mg by mouth at bedtime.     thyroid (ARMOUR) 120 MG tablet Take 120 mg by mouth daily before breakfast.     Zinc 22.5 MG TABS Take 22.5 mg by mouth daily.     diphenhydrAMINE (BENADRYL) 25 MG tablet Take 25 mg by mouth every 6 (six) hours as needed for allergies.     No current facility-administered medications for this encounter.    Allergies  Allergen Reactions   Quinidine Palpitations    REACTION: increased heart rate   Dofetilide Other (See Comments)    Elevated QTCs   Vicodin  [Hydrocodone-Acetaminophen] Swelling    Facial redness and swelling   Amoxicillin Itching and Rash   Atenolol Hives   Atorvastatin Other (See Comments)    REACTION: muscles pain   Latex Rash    Raw, rash and blisters - Tape only   Nadolol Hives    Social History   Socioeconomic History   Marital status: Married    Spouse name: Tour manager (Spouse)   Number of children: Not on file   Years of education: Not on file   Highest education level: Not on file  Occupational History   Occupation: Financial controller GI    Employer: [employer]  Tobacco Use   Smoking status: Never   Smokeless tobacco: Never  Vaping Use   Vaping Use: Never used  Substance and Sexual Activity   Alcohol use: No  Drug use: No   Sexual activity: Yes  Other Topics Concern   Not on file  Social History Narrative   Married   Social Determinants of Health   Financial Resource Strain: Not on file  Food Insecurity: Not on file  Transportation Needs: Not on file  Physical Activity: Not on file  Stress: Not on file  Social Connections: Not on file  Intimate Partner Violence: Not on file     ROS- All systems are reviewed and negative except as per the HPI above.  Physical Exam: Vitals:   09/22/21 1525  BP: (!) 166/76  Pulse: 93  Weight: 68.9 kg  Height: 5' 4.5"  (1.638 m)    GEN- The patient is a well appearing elderly female, alert and oriented x 3 today.   Head- normocephalic, atraumatic Eyes-  Sclera clear, conjunctiva pink Ears- hearing intact Oropharynx- clear Neck- supple  Lungs- Clear to ausculation bilaterally, normal work of breathing Heart- Regular rate and rhythm, no murmurs, rubs or gallops  GI- soft, NT, ND, + BS Extremities- no clubbing, cyanosis, or edema MS- no significant deformity or atrophy Skin- no rash or lesion Psych- euthymic mood, full affect Neuro- strength and sensation are intact  Wt Readings from Last 3 Encounters:  09/22/21 68.9 kg  09/12/21 68 kg  08/22/21 68.8 kg    EKG today demonstrates  SR, PVC Vent. rate 93 BPM PR interval 182 ms QRS duration 98 ms QT/QTcB 380/472 ms  Echo 07/09/20 demonstrated   1. Left ventricular ejection fraction, by estimation, is 55 to 60%. The  left ventricle has normal function. The left ventricle has no regional  wall motion abnormalities. Left ventricular diastolic parameters are  consistent with Grade I diastolic  dysfunction (impaired relaxation).   2. Right ventricular systolic function is normal. The right ventricular  size is normal. There is normal pulmonary artery systolic pressure. The  estimated right ventricular systolic pressure is 78.2 mmHg.   3. P2 prolpase with late systolic MR that is mild. There is mitral  annular disjunction noted which is associated with PVCs. 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 Mild mitral valve regurgitation.   4. The aortic valve is tricuspid. Aortic valve regurgitation is mild. No  aortic stenosis is present.   5. The inferior vena cava is normal in size with greater than 50%  respiratory variability, suggesting right atrial pressure of 3 mmHg.   6. Evidence of atrial level shunting detected by color flow Doppler.  There is a small patent foramen ovale with predominantly left to right  shunting across the atrial septum.    Comparison(s): No significant change from prior study.   Epic records are reviewed at length today  CHA2DS2-VASc Score = 3  The patient's score is based upon: CHF History: 0 HTN History: 1 Diabetes History: 0 Stroke History: 0 Vascular Disease History: 0 Age Score: 1 Gender Score: 1       ASSESSMENT AND PLAN: 1. Paroxysmal Atrial Fibrillation (ICD10:  I48.0) The patient's CHA2DS2-VASc score is 3, indicating a 3.2% annual risk of stroke.   S/p afib ablation 09/12/21 Patient appears to be maintaining SR. Suspect higher resting HR related to recent ablation. Continue flecainide 100 mg BID Continue diltiazem 120 mg at bedtime Continue Eliquis 5 mg BID for at least 3 months post ablation with no missed doses.   2. Secondary Hypercoagulable State (ICD10:  D68.69) The patient is at significant risk for stroke/thromboembolism based upon her CHA2DS2-VASc Score of 3.  Continue Apixaban (Eliquis).  3. HTN Stable, no changes today.   Follow up in the AF clinic and with Dr Curt Bears as scheduled.    [facility] [address] [place], [place] [address] [phone] 09/22/2021 4:11 PM

## 2021-09-29 ENCOUNTER — Telehealth: Payer: Self-pay | Admitting: Internal Medicine

## 2021-09-29 ENCOUNTER — Other Ambulatory Visit (HOSPITAL_COMMUNITY): Payer: Self-pay

## 2021-09-29 DIAGNOSIS — I4729 Other ventricular tachycardia: Secondary | ICD-10-CM

## 2021-09-29 MED ORDER — APIXABAN 5 MG PO TABS: 5.0000 mg | ORAL_TABLET | Freq: Two times a day (BID) | ORAL | 0 refills | Status: DC

## 2021-09-29 NOTE — Telephone Encounter (Signed)
Patient calling the office for samples of medication:   1.  What medication and dosage are you requesting samples for? apixaban (ELIQUIS) 5 MG TABS tablet   2.  Are you currently out of this medication? Pt has two weeks and a couple of days remaining... is needing enough to cover the first week of January

## 2021-09-30 MED ORDER — APIXABAN 5 MG PO TABS: 5.0000 mg | ORAL_TABLET | Freq: Two times a day (BID) | ORAL | 5 refills | Status: DC

## 2021-09-30 NOTE — Telephone Encounter (Signed)
Looks like pt received 3 weeks worth of samples yesterday from afib clinic which should cover her until next year. Would call to confirm if she actually needs samples still.

## 2021-09-30 NOTE — Telephone Encounter (Signed)
Pt is requesting a refill be sent to her [facility] for a 30 day supply. Please address

## 2021-09-30 NOTE — Telephone Encounter (Signed)
Pt last saw Clint Fenton, PA on 09/22/21, last labs 08/19/21 Creat 0.85, age 75, weight 68.9kg, based on specified criteria pt is on appropriate dosage of Eliquis 5mg  BID for afib.  Will refill rx.

## 2021-10-01 ENCOUNTER — Telehealth (HOSPITAL_COMMUNITY): Payer: Self-pay | Admitting: *Deleted

## 2021-10-01 IMAGING — CT NM WHOLE BODY PET/CT
1 of 6 series · 6 of 25 positions shown · non-contrast
Comparison: Breast MRI 12/11/2018

CLINICAL DATA: Initial treatment strategy for RIGHT breast
carcinoma. Prior bilateral mastectomies. Concern for RIGHT internal
mammary metastatic adenopathy.

EXAM:
NUCLEAR MEDICINE PET WHOLE BODY
TECHNIQUE: 5.6 mCi F-18 Estradiol was injected intravenously. Full-ring PET
imaging was performed from the vertex to feet after the radiotracer.
CT data was obtained and used for attenuation correction and
anatomic localization.

[Series 4: ct wb 5.0 b31f · axial · 5.0mm · 0.98mm/px · z∈[-999,+273]mm · 6 of 446 slices shown]
[im 64/446  soft-tissue]
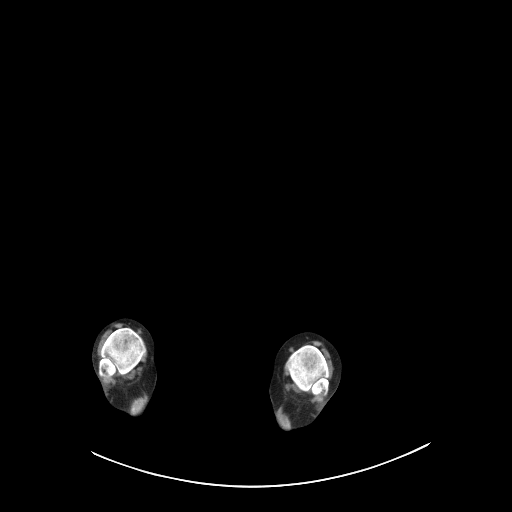
[im 128/446  soft-tissue]
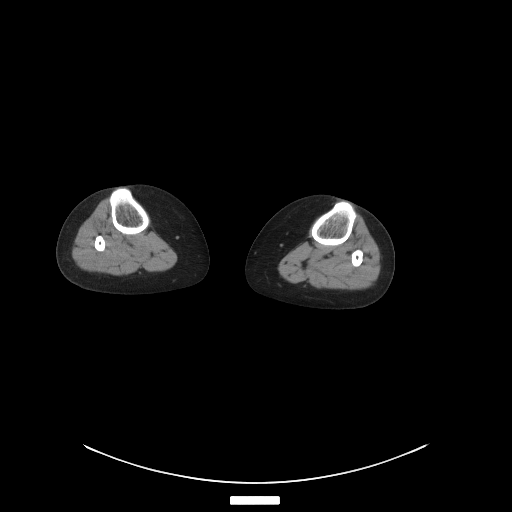
[im 191/446  soft-tissue]
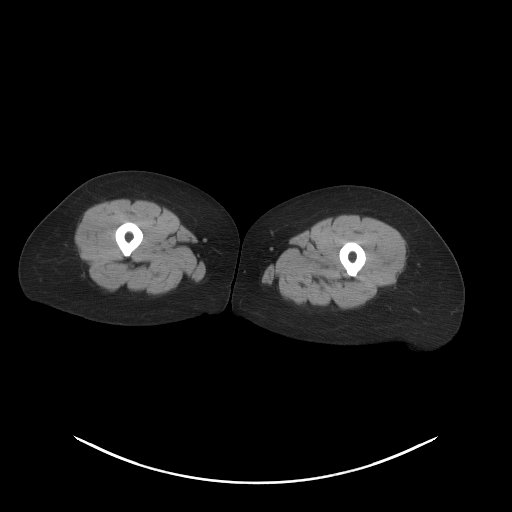
[im 255/446  soft-tissue]
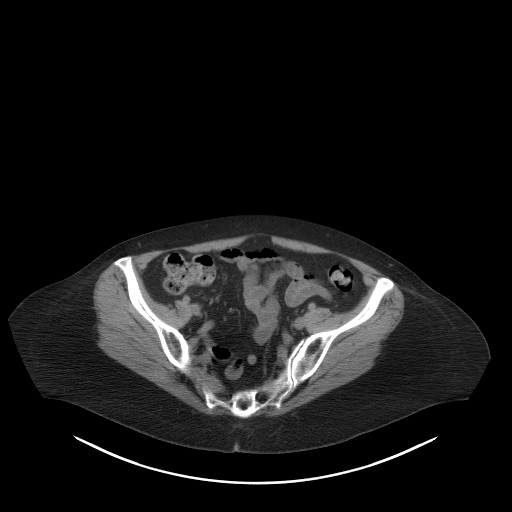
[im 318/446  soft-tissue]
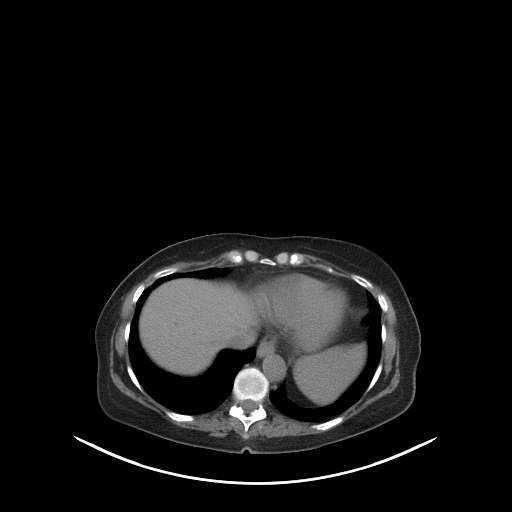
[im 382/446  soft-tissue]
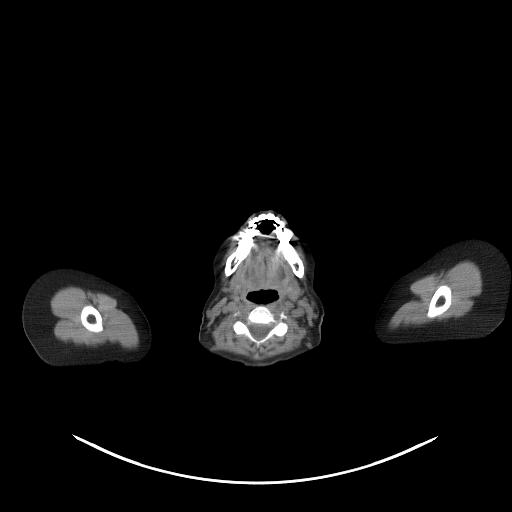

[6 of 25 positions shown; findings below may reference images not displayed]

FINDINGS: Mediastinal blood pool activity:

HEAD/NECK: No abnormal radiotracer accumulation within the brain. No
radiotracer accumulation within cervical lymph nodes.

Incidental CT findings: None

CHEST: RIGHT mastectomy.  Sub glandular breast implant on the LEFT.

No radiotracer accumulation in the RIGHT mastectomy bed. There is no
radiotracer accumulation along the RIGHT internal mammary chain to
suggest estrogen positive breast cancer metastasis. Similarly, no
axillary metastatic disease or mediastinal nodal disease.

Incidental CT findings: Review of the lung parenchyma demonstrates
no suspicious nodularity.

ABDOMEN/PELVIS: Intense physiologic activity noted in the liver. No
evidence of metastatic adenopathy in the abdomen pelvis. No
peritoneal metastasis. Post hysterectomy anatomy.

Incidental CT findings: No free fluid.  Postcholecystectomy.

SKELETON: No radiotracer accumulation within the skeleton.

Incidental CT findings: No lytic or sclerotic lesions identified on
the whole-body CT scan.

EXTREMITIES: No evidence of metastatic disease

Incidental CT findings: None
IMPRESSION: 1. No evidence of estrogen positive breast carcinoma in the RIGHT
chest wall following mastectomy.
2. No evidence estrogen positive nodal metastasis within the thorax.
Particular attention directed to the RIGHT internal mammary nodal
chain.
3. No evidence of distant metastatic disease.

## 2021-10-01 MED ORDER — DILTIAZEM HCL ER COATED BEADS 120 MG PO CP24: 120.0000 mg | ORAL_CAPSULE | Freq: Two times a day (BID) | ORAL | 1 refills | Status: DC

## 2021-10-01 NOTE — Telephone Encounter (Signed)
Patient called in stating she has noticed more "PVCs" than prior to ablation. Denies any symptoms but knows they are there. She "Confirmed was her stethoscope listening to her heart."  HR 77/80 Bp 144/86. Discussed with Patricia Peals PA will increase cardizem to 120mg  BID call with update later in week. Pt in agreement.

## 2021-10-09 ENCOUNTER — Other Ambulatory Visit: Payer: Self-pay | Admitting: *Deleted

## 2021-10-09 DIAGNOSIS — J84112 Idiopathic pulmonary fibrosis: Secondary | ICD-10-CM

## 2021-10-10 ENCOUNTER — Ambulatory Visit: Payer: PPO

## 2021-10-10 ENCOUNTER — Other Ambulatory Visit: Payer: Self-pay | Admitting: Pulmonary Disease

## 2021-10-10 ENCOUNTER — Other Ambulatory Visit: Payer: Self-pay

## 2021-10-10 LAB — SARS CORONAVIRUS 2 (TAT 6-24 HRS): SARS Coronavirus 2: NEGATIVE

## 2021-10-13 ENCOUNTER — Ambulatory Visit (HOSPITAL_COMMUNITY)
Admission: RE | Admit: 2021-10-13 | Discharge: 2021-10-13 | Disposition: A | Discharge status: Home / Self Care | Payer: PPO | Source: Ambulatory Visit | Attending: Nurse Practitioner | Admitting: Nurse Practitioner

## 2021-10-13 ENCOUNTER — Encounter: Payer: Self-pay | Admitting: Cardiology

## 2021-10-13 ENCOUNTER — Encounter (HOSPITAL_COMMUNITY): Payer: Self-pay | Admitting: Nurse Practitioner

## 2021-10-13 ENCOUNTER — Other Ambulatory Visit: Payer: Self-pay

## 2021-10-13 VITALS — BP 130/74 | HR 94 | Ht 64.5 in | Wt 151.8 lb

## 2021-10-13 DIAGNOSIS — I10 Essential (primary) hypertension: Secondary | ICD-10-CM | POA: Insufficient documentation

## 2021-10-13 DIAGNOSIS — Z853 Personal history of malignant neoplasm of breast: Secondary | ICD-10-CM | POA: Insufficient documentation

## 2021-10-13 DIAGNOSIS — Z7901 Long term (current) use of anticoagulants: Secondary | ICD-10-CM | POA: Diagnosis not present

## 2021-10-13 DIAGNOSIS — I480 Paroxysmal atrial fibrillation: Secondary | ICD-10-CM | POA: Insufficient documentation

## 2021-10-13 DIAGNOSIS — D6869 Other thrombophilia: Secondary | ICD-10-CM | POA: Diagnosis not present

## 2021-10-13 DIAGNOSIS — E785 Hyperlipidemia, unspecified: Secondary | ICD-10-CM | POA: Diagnosis not present

## 2021-10-13 DIAGNOSIS — Z79899 Other long term (current) drug therapy: Secondary | ICD-10-CM | POA: Insufficient documentation

## 2021-10-13 NOTE — Progress Notes (Signed)
Primary Care Physician: Patricia Dandy, NP Primary Electrophysiologist: Dr Lovena Le Referring Physician: Dr Delfina Redwood is a 75 y.o. female with a history of HTN, breast cancer, HLD, atrial fibrillation who presents for follow up in the [facility]. Patient is on Eliquis for a CHADS2VASC score of 3. She had some pleuritic chest pain post ablation which resolved with colchicine. No swallowing pain or groin issues. Has not noted any afib since the procedure.   Today, she denies symptoms of palpitations, chest pain, shortness of breath, orthopnea, PND, lower extremity edema, dizziness, presyncope, syncope, snoring, daytime somnolence, bleeding, or neurologic sequela. The patient is tolerating medications without difficulties and is otherwise without complaint today.    Atrial Fibrillation Risk Factors:  she does not have symptoms or diagnosis of sleep apnea. she does not have a history of rheumatic fever.   she has a BMI of Body mass index is 25.65 kg/m.Marland Kitchen Filed Weights   10/13/21 1057  Weight: 68.9 kg    Family History  Problem Relation Age of Onset   Other Brother        AGENT ORANGE and AntiLupus   Heart disease Brother        Stents and bypass x2   Arrhythmia Brother        AFIB   Heart attack Brother    Hypertension Brother    Squamous cell carcinoma Brother 13       Skin   Hyperlipidemia Mother 57   Osteoporosis Mother    Basal cell carcinoma Mother 86   Heart disease Father 26   Other Father        Cardiac arrest   Breast cancer Maternal Grandmother 85       metastatic   Breast cancer Other        dx. unknown age; maternal great-aunt   Lung cancer Maternal Aunt 75       hx. of smoking   Other Paternal Aunt        bilateral mastectomies due to multiple breast lumps   Leukemia Maternal Aunt    Cancer Maternal Aunt        unknown types   Thyroid cancer Neg Hx      Atrial Fibrillation Management history:  Previous  antiarrhythmic drugs: flecainide  Previous cardioversions: none Previous ablations: 09/12/21 CHADS2VASC score: 3 Anticoagulation history: Eliquis   Past Medical History:  Diagnosis Date   A-fib ([place])    Arthritis    thumb   Basal cell carcinoma 2001   "forehead, between eyebrows" right arm (2022)   Breast cancer ([place])    Carcinoma of thyroid gland ([place]) 2001   Chronic lower back pain    "worse is across my hips" (05/26/2016)   Depression    Dyslipidemia    Dyspnea    Family history of breast cancer    Family history of kidney cancer    Family history of leukemia    Family history of nonmelanoma skin cancer    Fibrocystic breast    Fibromyalgia    GERD (gastroesophageal reflux disease)    Heart murmur    History of blood transfusion 1992   "after subcutaneous mastectomies"   Hypertension    not on any medications   Hypothyroidism    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   Migraine    "visual; 2-3 times/year" (05/26/2016)   Mitral valve prolapse    PONV (postoperative nausea and vomiting)  Ventricular tachycardia    Hx of, controlled on sotalol therapy   Past Surgical History:  Procedure Laterality Date   ABDOMINAL HYSTERECTOMY  1998   ANTERIOR CERVICAL DECOMP/DISCECTOMY FUSION  2001   ATRIAL FIBRILLATION ABLATION N/A 09/12/2021   Procedure: ATRIAL FIBRILLATION ABLATION;  Surgeon: Constance Haw, MD;  Location: [facility];  Service: Cardiovascular;  Laterality: N/A;   BACK SURGERY     BASAL CELL CARCINOMA EXCISION  2001   "cut it out & did a flap, on forehead between my eyebrows"   BREAST IMPLANT EXCHANGE Bilateral 2001   [id number]   BREAST IMPLANT EXCHANGE Left 05/05/2021   Procedure: BREAST IMPLANT EXCHANGE;  Surgeon: Wallace Going, DO;  Location: [facility];  Service: Plastics;  Laterality: Left;   BREAST IMPLANT REMOVAL Right 02/05/2020   Procedure: REMOVAL RIGHT BREAST IMPLANT AND CAPSULECTOMY;  Surgeon: Jovita Kussmaul, MD;  Location:  [facility];  Service: General;  Laterality: Right;   BREAST LUMPECTOMY Right 02/05/2020   Procedure: RIGHT BREAST CENTRAL LUMPECTOMY;  Surgeon: Jovita Kussmaul, MD;  Location: [place];  Service: General;  Laterality: Right;   BREAST RECONSTRUCTION WITH PLACEMENT OF TISSUE EXPANDER AND FLEX HD (ACELLULAR HYDRATED DERMIS) Right 01/20/2021   Procedure: BREAST RECONSTRUCTION WITH PLACEMENT OF TISSUE EXPANDER AND FLEX HD (ACELLULAR HYDRATED DERMIS);  Surgeon: Wallace Going, DO;  Location: [facility];  Service: Plastics;  Laterality: Right;  2 hours   CARPAL TUNNEL RELEASE Right 2006   COLONOSCOPY     DILATION AND CURETTAGE OF UTERUS  1970s X 2-3   ELECTROPHYSIOLOGIC STUDY  1994 X 2;2001   "to see it it was sustained VT; cause thyroid levels were causing arrhythmias"   EYE SURGERY  12/2020   cataracts   LAPAROSCOPIC CHOLECYSTECTOMY     MASTECTOMY Bilateral 1992   "subcutaneous"   MELANOMA EXCISION Left 2001   "ankle, stage I"   PLACEMENT OF BREAST IMPLANTS Bilateral 1992   [id number]   REMOVAL OF TISSUE EXPANDER AND PLACEMENT OF IMPLANT Right 05/05/2021   Procedure: REMOVAL OF TISSUE EXPANDER AND PLACEMENT OF IMPLANT;  Surgeon: Wallace Going, DO;  Location: [facility];  Service: Plastics;  Laterality: Right;   TOTAL THYROIDECTOMY  12/1999   "cancer"   VENTRICULAR ABLATION SURGERY  2011   ventricular tchycardia    Current Outpatient Medications  Medication Sig Dispense Refill   acetaminophen (TYLENOL) 500 MG tablet Take 500-1,000 mg by mouth every 6 (six) hours as needed for mild pain or moderate pain.     ALPRAZolam (XANAX) 0.5 MG tablet Take 0.25 mg by mouth at bedtime.     apixaban (ELIQUIS) 5 MG TABS tablet Take 1 tablet (5 mg total) by mouth 2 (two) times daily. 60 tablet 5   Ascorbic Acid (VITAMIN C) 1000 MG tablet Take 1,000 mg by mouth daily.     B Complex-C (SUPER B COMPLEX PO) Take 1 capsule by mouth at bedtime.     beta carotene 10000 UNIT  capsule Take 10,000 Units by mouth daily.     Biotin 5 MG CAPS Take 5 mg by mouth at bedtime.      Calcium 600-200 MG-UNIT tablet Take 1 tablet by mouth daily.     cetirizine (ZYRTEC) 10 MG tablet Take 10 mg by mouth at bedtime.     Cholecalciferol (VITAMIN D) 125 MCG (5000 UT) CAPS Take 5,000 Units by mouth daily.     diltiazem (CARDIZEM CD) 120 MG 24 hr capsule  Take 1 capsule (120 mg total) by mouth 2 (two) times daily. (Patient taking differently: Take 120 mg by mouth daily.) 90 capsule 1   diphenhydrAMINE (BENADRYL) 25 MG tablet Take 25 mg by mouth every 6 (six) hours as needed for allergies.     docusate sodium (COLACE) 100 MG capsule Take 200 mg by mouth as needed.     escitalopram (LEXAPRO) 20 MG tablet Take 20 mg by mouth at bedtime.     ezetimibe (ZETIA) 10 MG tablet Take 10 mg by mouth daily.     famotidine (PEPCID) 20 MG tablet Take 20 mg by mouth as needed for heartburn or indigestion.     flecainide (TAMBOCOR) 100 MG tablet Take 1 tablet by mouth twice a day and may take an additional tablet daily for increased palpitations 225 tablet 1   fluticasone (FLONASE) 50 MCG/ACT nasal spray Place 1 spray into both nostrils daily as needed for allergies.     furosemide (LASIX) 20 MG tablet Take 20 mg by mouth daily as needed for fluid (1-2lb weight gain overnight).     magnesium hydroxide (MILK OF MAGNESIA) 400 MG/5ML suspension Take 15 mLs by mouth daily as needed for mild constipation.     MANGANESE PO Take 40 mg by mouth at bedtime.     Melatonin 10 MG TABS Take 10 mg by mouth at bedtime.      omeprazole (PRILOSEC) 20 MG capsule Take 20 mg by mouth daily as needed (heartburn).     OVER THE COUNTER MEDICATION Take 1 capsule by mouth daily. Co Q10 100 mg with K2     Polyvinyl Alcohol (LIQUID TEARS OP) Place 1 drop into both eyes daily as needed (dry eyes).     ranolazine (RANEXA) 500 MG 12 hr tablet      Simethicone 125 MG CAPS Take 250 mg by mouth daily as needed (bloating).     Thiamine  HCl (VITAMIN B1) 100 MG TABS Take 100 mg by mouth at bedtime.     thyroid (ARMOUR) 120 MG tablet Take 120 mg by mouth daily before breakfast.     Zinc 22.5 MG TABS Take 22.5 mg by mouth daily.     No current facility-administered medications for this encounter.    Allergies  Allergen Reactions   Quinidine Palpitations    REACTION: increased heart rate   Dofetilide Other (See Comments)    Elevated QTCs   Vicodin  [Hydrocodone-Acetaminophen] Swelling    Facial redness and swelling   Amoxicillin Itching and Rash   Atenolol Hives   Atorvastatin Other (See Comments)    REACTION: muscles pain   Latex Rash    Raw, rash and blisters - Tape only   Nadolol Hives    Social History   Socioeconomic History   Marital status: Married    Spouse name: Tour manager (Spouse)   Number of children: Not on file   Years of education: Not on file   Highest education level: Not on file  Occupational History   Occupation: Financial controller GI    Employer: [employer]  Tobacco Use   Smoking status: Never   Smokeless tobacco: Never  Vaping Use   Vaping Use: Never used  Substance and Sexual Activity   Alcohol use: No   Drug use: No   Sexual activity: Yes  Other Topics Concern   Not on file  Social History Narrative   Married   Social Determinants of Health   Financial Resource Strain: Not on file  Food Insecurity:  Not on file  Transportation Needs: Not on file  Physical Activity: Not on file  Stress: Not on file  Social Connections: Not on file  Intimate Partner Violence: Not on file     ROS- All systems are reviewed and negative except as per the HPI above.  Physical Exam: Vitals:   10/13/21 1057  BP: 130/74  Pulse: 94  Weight: 68.9 kg  Height: 5' 4.5" (1.638 m)    GEN- The patient is a well appearing elderly female, alert and oriented x 3 today.   Head- normocephalic, atraumatic Eyes-  Sclera clear, conjunctiva pink Ears- hearing intact Oropharynx- clear Neck- supple  Lungs-  Clear to ausculation bilaterally, normal work of breathing Heart- Regular rate and rhythm, no murmurs, rubs or gallops  GI- soft, NT, ND, + BS Extremities- no clubbing, cyanosis, or edema MS- no significant deformity or atrophy Skin- no rash or lesion Psych- euthymic mood, full affect Neuro- strength and sensation are intact  Wt Readings from Last 3 Encounters:  10/13/21 68.9 kg  09/22/21 68.9 kg  09/12/21 68 kg    EKG today demonstrates  SR, PVC Vent. rate 93 BPM PR interval 182 ms QRS duration 98 ms QT/QTcB 380/472 ms  Echo 07/09/20 demonstrated   1. Left ventricular ejection fraction, by estimation, is 55 to 60%. The  left ventricle has normal function. The left ventricle has no regional  wall motion abnormalities. Left ventricular diastolic parameters are  consistent with Grade I diastolic  dysfunction (impaired relaxation).   2. Right ventricular systolic function is normal. The right ventricular  size is normal. There is normal pulmonary artery systolic pressure. The  estimated right ventricular systolic pressure is 75.9 mmHg.   3. P2 prolpase with late systolic MR that is mild. There is mitral  annular disjunction noted which is associated with PVCs. 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 Mild mitral valve regurgitation.   4. The aortic valve is tricuspid. Aortic valve regurgitation is mild. No  aortic stenosis is present.   5. The inferior vena cava is normal in size with greater than 50%  respiratory variability, suggesting right atrial pressure of 3 mmHg.   6. Evidence of atrial level shunting detected by color flow Doppler.  There is a small patent foramen ovale with predominantly left to right  shunting across the atrial septum.   Comparison(s): No significant change from prior study.   Epic records are reviewed at length today  CHA2DS2-VASc Score = 3  The patient's score is based upon: CHF History: 0 HTN History: 1 Diabetes History: 0 Stroke History:  0 Vascular Disease History: 0 Age Score: 1 Gender Score: 1        ASSESSMENT AND PLAN: 1. Paroxysmal Atrial Fibrillation (ICD10:  I48.0) The patient's CHA2DS2-VASc score is 3, indicating a 3.2% annual risk of stroke.   S/p afib ablation 09/12/21 Patient appears to be maintaining SR Continue flecainide 100 mg BID Continue diltiazem 120 mg at bedtime Continue Eliquis 5 mg BID for at least 3 months post ablation with no missed doses.   2. Secondary Hypercoagulable State (ICD10:  D68.69) The patient is at significant risk for stroke/thromboembolism based upon her CHA2DS2-VASc Score of 3.  Continue Apixaban (Eliquis).   3. HTN Stable, no changes today.   Follow up with Dr Curt Bears as scheduled 12/23/20   Adline Peals PA-C [facility] [address] [place], [place] [address] [phone] 10/13/2021 1:15 PM

## 2021-10-14 ENCOUNTER — Ambulatory Visit (INDEPENDENT_AMBULATORY_CARE_PROVIDER_SITE_OTHER): Payer: PPO | Admitting: Pulmonary Disease

## 2021-10-14 DIAGNOSIS — J84112 Idiopathic pulmonary fibrosis: Secondary | ICD-10-CM

## 2021-10-14 LAB — PULMONARY FUNCTION TEST
DL/VA % pred: 105 %
DL/VA: 4.33 ml/min/mmHg/L
DLCO cor % pred: 87 %
DLCO cor: 16.88 ml/min/mmHg
DLCO unc % pred: 89 %
DLCO unc: 17.08 ml/min/mmHg
FEF 25-75 Post: 2.42 L/sec
FEF 25-75 Pre: 2.12 L/sec
FEF2575-%Change-Post: 14 %
FEF2575-%Pred-Post: 141 %
FEF2575-%Pred-Pre: 124 %
FEV1-%Change-Post: 3 %
FEV1-%Pred-Post: 95 %
FEV1-%Pred-Pre: 92 %
FEV1-Post: 2.04 L
FEV1-Pre: 1.97 L
FEV1FVC-%Change-Post: 5 %
FEV1FVC-%Pred-Pre: 109 %
FEV6-%Change-Post: 0 %
FEV6-%Pred-Post: 87 %
FEV6-%Pred-Pre: 86 %
FEV6-Post: 2.36 L
FEV6-Pre: 2.35 L
FEV6FVC-%Change-Post: 0 %
FEV6FVC-%Pred-Post: 105 %
FEV6FVC-%Pred-Pre: 105 %
FVC-%Change-Post: -1 %
FVC-%Pred-Post: 82 %
FVC-%Pred-Pre: 84 %
FVC-Post: 2.36 L
FVC-Pre: 2.4 L
Post FEV1/FVC ratio: 86 %
Post FEV6/FVC ratio: 100 %
Pre FEV1/FVC ratio: 82 %
Pre FEV6/FVC Ratio: 100 %
RV % pred: 87 %
RV: 1.99 L
TLC % pred: 87 %
TLC: 4.42 L

## 2021-10-14 NOTE — Progress Notes (Signed)
PFT done today. 

## 2021-10-30 ENCOUNTER — Encounter: Payer: Self-pay | Admitting: Pulmonary Disease

## 2021-10-30 ENCOUNTER — Other Ambulatory Visit: Payer: Self-pay

## 2021-10-30 ENCOUNTER — Ambulatory Visit: Payer: PPO | Admitting: Pulmonary Disease

## 2021-10-30 VITALS — BP 128/78 | HR 85 | Temp 97.7°F | Ht 64.5 in | Wt 151.6 lb

## 2021-10-30 DIAGNOSIS — R9389 Abnormal findings on diagnostic imaging of other specified body structures: Secondary | ICD-10-CM | POA: Diagnosis not present

## 2021-10-30 NOTE — Patient Instructions (Signed)
CT scan did show some evolution of scarring in the lungs-did improve over time  I do not expect this to be given you have any significant symptoms  I will see you as needed  Call with significant concerns  No need for repeat CT scans

## 2021-10-30 NOTE — Progress Notes (Signed)
Patricia Alvarado    [id number]    14-Jul-1946  Primary Care Physician:[name], [name], NP  Referring Physician: [name], NP [place],  [place] [address]  Chief complaint:   Shortness of breath Abnormal CT scan  HPI: Shortness of breath shortness of breath is better  Still has occasional shortness of breath  Did have ablation in November Follow-up with cardiology pending  Breathing has been relatively about the same No ongoing cough  Last year-diagnosis of breast cancer for which she had radiation surgery  Right breast lumpectomy 02/05/2020 Radiation treatment between 03/13/2020 to 04/23/2020-Notes reviewed  Admits to shortness of breath with higher grade moderate to severe exertion Able to function well  Appointment was made basically to follow-up on CT scan findings which showed inflammatory changes in the right upper lobe and small pleural effusion versus pleural thickening on the right, calcified left diaphragm process  Recent echocardiogram was within normal limit  She did have Covid 08/15/2019 -Had a PET scan post when she had Covid that did not reveal any inflammatory process  Retired Marine scientist Non-smoker   Outpatient Encounter Medications as of 10/30/2021  Medication Sig   acetaminophen (TYLENOL) 500 MG tablet Take 500-1,000 mg by mouth every 6 (six) hours as needed for mild pain or moderate pain.   ALPRAZolam (XANAX) 0.5 MG tablet Take 0.25 mg by mouth at bedtime.   apixaban (ELIQUIS) 5 MG TABS tablet Take 1 tablet (5 mg total) by mouth 2 (two) times daily.   Ascorbic Acid (VITAMIN C) 1000 MG tablet Take 1,000 mg by mouth daily.   B Complex-C (SUPER B COMPLEX PO) Take 1 capsule by mouth at bedtime.   beta carotene 10000 UNIT capsule Take 10,000 Units by mouth daily.   Biotin 5 MG CAPS Take 5 mg by mouth at bedtime.    Calcium 600-200 MG-UNIT tablet Take 1 tablet by mouth daily.   cetirizine (ZYRTEC) 10 MG tablet Take 10 mg by mouth  at bedtime.   Cholecalciferol (VITAMIN D) 125 MCG (5000 UT) CAPS Take 5,000 Units by mouth daily.   diltiazem (CARDIZEM CD) 120 MG 24 hr capsule Take 1 capsule (120 mg total) by mouth 2 (two) times daily. (Patient taking differently: Take 120 mg by mouth daily.)   diphenhydrAMINE (BENADRYL) 25 MG tablet Take 25 mg by mouth every 6 (six) hours as needed for allergies.   docusate sodium (COLACE) 100 MG capsule Take 200 mg by mouth as needed.   escitalopram (LEXAPRO) 20 MG tablet Take 20 mg by mouth at bedtime.   ezetimibe (ZETIA) 10 MG tablet Take 10 mg by mouth daily.   famotidine (PEPCID) 20 MG tablet Take 20 mg by mouth as needed for heartburn or indigestion.   flecainide (TAMBOCOR) 100 MG tablet Take 1 tablet by mouth twice a day and may take an additional tablet daily for increased palpitations   fluticasone (FLONASE) 50 MCG/ACT nasal spray Place 1 spray into both nostrils daily as needed for allergies.   furosemide (LASIX) 20 MG tablet Take 20 mg by mouth daily as needed for fluid (1-2lb weight gain overnight).   magnesium hydroxide (MILK OF MAGNESIA) 400 MG/5ML suspension Take 15 mLs by mouth daily as needed for mild constipation.   MANGANESE PO Take 40 mg by mouth at bedtime.   Melatonin 10 MG TABS Take 10 mg by mouth at bedtime.    omeprazole (PRILOSEC) 20 MG capsule Take 20 mg  by mouth daily as needed (heartburn).   OVER THE COUNTER MEDICATION Take 1 capsule by mouth daily. Co Q10 100 mg with K2   Polyvinyl Alcohol (LIQUID TEARS OP) Place 1 drop into both eyes daily as needed (dry eyes).   ranolazine (RANEXA) 500 MG 12 hr tablet    Simethicone 125 MG CAPS Take 250 mg by mouth daily as needed (bloating).   Thiamine HCl (VITAMIN B1) 100 MG TABS Take 100 mg by mouth at bedtime.   thyroid (ARMOUR) 120 MG tablet Take 120 mg by mouth daily before breakfast.   Zinc 22.5 MG TABS Take 22.5 mg by mouth daily.   [DISCONTINUED] diltiazem (DILT-XR) 120 MG 24 hr capsule Take 1 capsule (120 mg total)  by mouth daily.   No facility-administered encounter medications on file as of 10/30/2021.    Allergies as of 10/30/2021 - Review Complete 10/30/2021  Allergen Reaction Noted   Quinidine Palpitations    Amoxicillin Itching and Rash    Atenolol Hives    Atorvastatin Other (See Comments)    Dofetilide Other (See Comments) 05/29/2016   Latex Rash 05/26/2016   Nadolol Hives    Vicodin  [hydrocodone-acetaminophen] Swelling 03/05/2020    Past Medical History:  Diagnosis Date   A-fib [facility])    Arthritis    thumb   Basal cell carcinoma 2001   "forehead, between eyebrows" right arm (2022)   Breast cancer ([place])    Carcinoma of thyroid gland ([place]) 2001   Chronic lower back pain    "worse is across my hips" (05/26/2016)   Depression    Dyslipidemia    Dyspnea    Family history of breast cancer    Family history of kidney cancer    Family history of leukemia    Family history of nonmelanoma skin cancer    Fibrocystic breast    Fibromyalgia    GERD (gastroesophageal reflux disease)    Heart murmur    History of blood transfusion 1992   "after subcutaneous mastectomies"   Hypertension    not on any medications   Hypothyroidism    Malignant melanoma of left ankle ([place]) 2001   Migraine    "visual; 2-3 times/year" (05/26/2016)   Mitral valve prolapse    PONV (postoperative nausea and vomiting)    Ventricular tachycardia    Hx of, controlled on sotalol therapy    Past Surgical History:  Procedure Laterality Date   ABDOMINAL HYSTERECTOMY  1998   ANTERIOR CERVICAL DECOMP/DISCECTOMY FUSION  2001   ATRIAL FIBRILLATION ABLATION N/A 09/12/2021   Procedure: ATRIAL FIBRILLATION ABLATION;  Surgeon: Constance Haw, MD;  Location: [place] CV LAB;  Service: Cardiovascular;  Laterality: N/A;   BACK SURGERY     BASAL CELL CARCINOMA EXCISION  2001   "cut it out & did a flap, on forehead between my eyebrows"   BREAST IMPLANT EXCHANGE Bilateral 2001   [id number]   BREAST IMPLANT  EXCHANGE Left 05/05/2021   Procedure: BREAST IMPLANT EXCHANGE;  Surgeon: Wallace Going, DO;  Location: [place];  Service: Plastics;  Laterality: Left;   BREAST IMPLANT REMOVAL Right 02/05/2020   Procedure: REMOVAL RIGHT BREAST IMPLANT AND CAPSULECTOMY;  Surgeon: Jovita Kussmaul, MD;  Location: [place];  Service: General;  Laterality: Right;   BREAST LUMPECTOMY Right 02/05/2020   Procedure: RIGHT BREAST CENTRAL LUMPECTOMY;  Surgeon: Jovita Kussmaul, MD;  Location: [place];  Service: General;  Laterality: Right;   BREAST RECONSTRUCTION WITH PLACEMENT OF  TISSUE EXPANDER AND FLEX HD (ACELLULAR HYDRATED DERMIS) Right 01/20/2021   Procedure: BREAST RECONSTRUCTION WITH PLACEMENT OF TISSUE EXPANDER AND FLEX HD (ACELLULAR HYDRATED DERMIS);  Surgeon: Wallace Going, DO;  Location: [place];  Service: Plastics;  Laterality: Right;  2 hours   CARPAL TUNNEL RELEASE Right 2006   COLONOSCOPY     DILATION AND CURETTAGE OF UTERUS  1970s X 2-3   ELECTROPHYSIOLOGIC STUDY  1994 X 2;2001   "to see it it was sustained VT; cause thyroid levels were causing arrhythmias"   EYE SURGERY  12/2020   cataracts   LAPAROSCOPIC CHOLECYSTECTOMY     MASTECTOMY Bilateral 1992   "subcutaneous"   MELANOMA EXCISION Left 2001   "ankle, stage I"   PLACEMENT OF BREAST IMPLANTS Bilateral 1992   [id number]   REMOVAL OF TISSUE EXPANDER AND PLACEMENT OF IMPLANT Right 05/05/2021   Procedure: REMOVAL OF TISSUE EXPANDER AND PLACEMENT OF IMPLANT;  Surgeon: Wallace Going, DO;  Location: [place];  Service: Plastics;  Laterality: Right;   TOTAL THYROIDECTOMY  12/1999   "cancer"   VENTRICULAR ABLATION SURGERY  2011   ventricular tchycardia    Family History  Problem Relation Age of Onset   Other Brother        AGENT ORANGE and AntiLupus   Heart disease Brother        Stents and bypass x2   Arrhythmia Brother        AFIB   Heart attack Brother    Hypertension Brother    Squamous cell  carcinoma Brother 14       Skin   Hyperlipidemia Mother 41   Osteoporosis Mother    Basal cell carcinoma Mother 7   Heart disease Father 76   Other Father        Cardiac arrest   Breast cancer Maternal Grandmother 85       metastatic   Breast cancer Other        dx. unknown age; maternal great-aunt   Lung cancer Maternal Aunt 55       hx. of smoking   Other Paternal Aunt        bilateral mastectomies due to multiple breast lumps   Leukemia Maternal Aunt    Cancer Maternal Aunt        unknown types   Thyroid cancer Neg Hx     Social History   Socioeconomic History   Marital status: Married    Spouse name: Tour manager (Spouse)   Number of children: Not on file   Years of education: Not on file   Highest education level: Not on file  Occupational History   Occupation: Financial controller GI    Employer: [employer]  Tobacco Use   Smoking status: Never   Smokeless tobacco: Never  Vaping Use   Vaping Use: Never used  Substance and Sexual Activity   Alcohol use: No   Drug use: No   Sexual activity: Yes  Other Topics Concern   Not on file  Social History Narrative   Married   Social Determinants of Health   Financial Resource Strain: Not on file  Food Insecurity: Not on file  Transportation Needs: Not on file  Physical Activity: Not on file  Stress: Not on file  Social Connections: Not on file  Intimate Partner Violence: Not on file    Review of Systems  Respiratory:  Positive for shortness of breath.    Vitals:   10/30/21 0927  BP: 128/78  Pulse: 85  Temp: 97.7 F (36.5 C)  SpO2: 97%     Physical Exam Constitutional:      Appearance: Normal appearance.  HENT:     Nose: No congestion or rhinorrhea.  Eyes:     Conjunctiva/sclera: Conjunctivae normal.  Cardiovascular:     Rate and Rhythm: Normal rate and regular rhythm.     Heart sounds: No murmur heard.   No friction rub.  Pulmonary:     Effort: No respiratory distress.     Breath sounds: No  stridor. No wheezing or rhonchi.  Musculoskeletal:     Cervical back: No rigidity or tenderness.  Neurological:     Mental Status: She is alert.  Psychiatric:        Mood and Affect: Mood normal.   Data Reviewed: CT scan was reviewed with the patient -Repeat CT scan after a year of previous shows evolution and improvement in findings   Assessment:   Postradiation inflammatory changes  Shortness of breath-better  Cough  Atrial fibrillation with RVR-recent ablation  Plan/Recommendations:  Clinical monitoring remains appropriate  With stable to improved findings on CT scan of the chest  No CT follow-up is needed  Encouraged to call with any significant concerns   Sherrilyn Rist MD [facility] 10/30/2021, 9:32 AM  CC: [name], NP

## 2021-10-31 ENCOUNTER — Other Ambulatory Visit: Payer: Self-pay | Admitting: Internal Medicine

## 2021-10-31 DIAGNOSIS — E039 Hypothyroidism, unspecified: Secondary | ICD-10-CM | POA: Diagnosis not present

## 2021-10-31 DIAGNOSIS — R5383 Other fatigue: Secondary | ICD-10-CM | POA: Diagnosis not present

## 2021-10-31 DIAGNOSIS — I10 Essential (primary) hypertension: Secondary | ICD-10-CM | POA: Diagnosis not present

## 2021-10-31 DIAGNOSIS — F330 Major depressive disorder, recurrent, mild: Secondary | ICD-10-CM | POA: Diagnosis not present

## 2021-10-31 DIAGNOSIS — G4700 Insomnia, unspecified: Secondary | ICD-10-CM | POA: Diagnosis not present

## 2021-10-31 DIAGNOSIS — I4729 Other ventricular tachycardia: Secondary | ICD-10-CM

## 2021-10-31 DIAGNOSIS — E785 Hyperlipidemia, unspecified: Secondary | ICD-10-CM | POA: Diagnosis not present

## 2021-10-31 DIAGNOSIS — I480 Paroxysmal atrial fibrillation: Secondary | ICD-10-CM | POA: Diagnosis not present

## 2021-10-31 DIAGNOSIS — Z853 Personal history of malignant neoplasm of breast: Secondary | ICD-10-CM | POA: Diagnosis not present

## 2021-10-31 DIAGNOSIS — Z79899 Other long term (current) drug therapy: Secondary | ICD-10-CM | POA: Diagnosis not present

## 2021-10-31 DIAGNOSIS — R5381 Other malaise: Secondary | ICD-10-CM | POA: Diagnosis not present

## 2021-10-31 DIAGNOSIS — M797 Fibromyalgia: Secondary | ICD-10-CM | POA: Diagnosis not present

## 2021-10-31 DIAGNOSIS — Z6825 Body mass index (BMI) 25.0-25.9, adult: Secondary | ICD-10-CM | POA: Diagnosis not present

## 2021-10-31 NOTE — Telephone Encounter (Signed)
Age 76, weight 69kg, scr 0.85 on 07/2021, last visit in dec, afib indication, refill sent in

## 2021-11-06 ENCOUNTER — Ambulatory Visit: Payer: PPO | Admitting: Plastic Surgery

## 2021-11-07 ENCOUNTER — Ambulatory Visit: Payer: PPO | Admitting: Plastic Surgery

## 2021-12-05 ENCOUNTER — Other Ambulatory Visit: Payer: Self-pay | Admitting: Internal Medicine

## 2021-12-23 ENCOUNTER — Encounter: Payer: Self-pay | Admitting: Cardiology

## 2021-12-23 ENCOUNTER — Ambulatory Visit: Payer: PPO | Admitting: Cardiology

## 2021-12-23 ENCOUNTER — Other Ambulatory Visit: Payer: Self-pay

## 2021-12-23 VITALS — BP 132/70 | HR 100 | Ht 64.5 in | Wt 149.8 lb

## 2021-12-23 DIAGNOSIS — I480 Paroxysmal atrial fibrillation: Secondary | ICD-10-CM | POA: Diagnosis not present

## 2021-12-23 DIAGNOSIS — I472 Ventricular tachycardia, unspecified: Secondary | ICD-10-CM | POA: Diagnosis not present

## 2021-12-23 NOTE — Patient Instructions (Signed)
Medication Instructions:  Your physician recommends that you continue on your current medications as directed. Please refer to the Current Medication list given to you today.  *If you need a refill on your cardiac medications before your next appointment, please call your pharmacy*   Lab Work: None ordered   Testing/Procedures: None ordered   Follow-Up: At [facility], you and your health needs are our priority.  As part of our continuing mission to provide you with exceptional heart care, we have created designated Provider Care Teams.  These Care Teams include your primary Cardiologist (physician) and Advanced Practice Providers (APPs -  Physician Assistants and Nurse Practitioners) who all work together to provide you with the care you need, when you need it.  Your next appointment:   3 month(s)  The format for your next appointment:   In Person  Provider:   Will Camnitz, MD    Thank you for choosing [facility]!!   Albertina Leise, RN [phone]     

## 2021-12-23 NOTE — Progress Notes (Signed)
Electrophysiology Office Note   Date:  12/23/2021   ID:  Kennesha, Brewbaker [birth year], MRN [id number]  PCP:  [name], NP  Cardiologist:   Primary Electrophysiologist: Gaye Alken, MD    Chief Complaint: AF   History of Present Illness: Patricia Alvarado is a 76 y.o. female who is being seen today for the evaluation of AF at the request of [name], [name], NP. Presenting today for electrophysiology evaluation.  She has a history significant for hypertension, PVCs, nonsustained VT.  She also has atrial fibrillation.  She is currently on flecainide.  She had had a few breakthrough episodes of atrial fibrillation despite her flecainide.  She is now status post atrial fibrillation ablation 09/12/2021.  Today, denies symptoms of palpitations, chest pain, shortness of breath, orthopnea, PND, lower extremity edema, claudication, dizziness, presyncope, syncope, bleeding, or neurologic sequela. The patient is tolerating medications without difficulties.  Since her ablation she has done well.  She has noted no further episodes of atrial fibrillation.  She is happy with her control.  She is unfortunately had continued PVCs.  Her PVCs bother her more significantly in December when she felt like she was in bigeminy quite a bit.  Over the last few months though, she has felt much improved without symptoms from her PVCs   Past Medical History:  Diagnosis Date   A-fib [facility])    Arthritis    thumb   Basal cell carcinoma 2001   "forehead, between eyebrows" right arm (2022)   Breast cancer ([place])    Carcinoma of thyroid gland ([place]) 2001   Chronic lower back pain    "worse is across my hips" (05/26/2016)   Depression    Dyslipidemia    Dyspnea    Family history of breast cancer    Family history of kidney cancer    Family history of leukemia    Family history of nonmelanoma skin cancer    Fibrocystic breast    Fibromyalgia    GERD (gastroesophageal reflux disease)    Heart murmur     History of blood transfusion 1992   "after subcutaneous mastectomies"   Hypertension    not on any medications   Hypothyroidism    Malignant melanoma of left ankle ([place]) 2001   Migraine    "visual; 2-3 times/year" (05/26/2016)   Mitral valve prolapse    PONV (postoperative nausea and vomiting)    Ventricular tachycardia    Hx of, controlled on sotalol therapy   Past Surgical History:  Procedure Laterality Date   ABDOMINAL HYSTERECTOMY  1998   ANTERIOR CERVICAL DECOMP/DISCECTOMY FUSION  2001   ATRIAL FIBRILLATION ABLATION N/A 09/12/2021   Procedure: ATRIAL FIBRILLATION ABLATION;  Surgeon: Constance Haw, MD;  Location: [place] CV LAB;  Service: Cardiovascular;  Laterality: N/A;   BACK SURGERY     BASAL CELL CARCINOMA EXCISION  2001   "cut it out & did a flap, on forehead between my eyebrows"   BREAST IMPLANT EXCHANGE Bilateral 2001   [id number]   BREAST IMPLANT EXCHANGE Left 05/05/2021   Procedure: BREAST IMPLANT EXCHANGE;  Surgeon: Wallace Going, DO;  Location: [place];  Service: Plastics;  Laterality: Left;   BREAST IMPLANT REMOVAL Right 02/05/2020   Procedure: REMOVAL RIGHT BREAST IMPLANT AND CAPSULECTOMY;  Surgeon: Jovita Kussmaul, MD;  Location: [place];  Service: General;  Laterality: Right;   BREAST LUMPECTOMY Right 02/05/2020   Procedure: RIGHT BREAST CENTRAL LUMPECTOMY;  Surgeon: Marlou Starks,  Sena Hitch, MD;  Location: [place];  Service: General;  Laterality: Right;   BREAST RECONSTRUCTION WITH PLACEMENT OF TISSUE EXPANDER AND FLEX HD (ACELLULAR HYDRATED DERMIS) Right 01/20/2021   Procedure: BREAST RECONSTRUCTION WITH PLACEMENT OF TISSUE EXPANDER AND FLEX HD (ACELLULAR HYDRATED DERMIS);  Surgeon: Wallace Going, DO;  Location: [place];  Service: Plastics;  Laterality: Right;  2 hours   CARPAL TUNNEL RELEASE Right 2006   COLONOSCOPY     DILATION AND CURETTAGE OF UTERUS  1970s X 2-3   ELECTROPHYSIOLOGIC STUDY  1994 X 2;2001   "to  see it it was sustained VT; cause thyroid levels were causing arrhythmias"   EYE SURGERY  12/2020   cataracts   LAPAROSCOPIC CHOLECYSTECTOMY     MASTECTOMY Bilateral 1992   "subcutaneous"   MELANOMA EXCISION Left 2001   "ankle, stage I"   PLACEMENT OF BREAST IMPLANTS Bilateral 1992   [id number]   REMOVAL OF TISSUE EXPANDER AND PLACEMENT OF IMPLANT Right 05/05/2021   Procedure: REMOVAL OF TISSUE EXPANDER AND PLACEMENT OF IMPLANT;  Surgeon: Wallace Going, DO;  Location: [place];  Service: Plastics;  Laterality: Right;   TOTAL THYROIDECTOMY  12/1999   "cancer"   VENTRICULAR ABLATION SURGERY  2011   ventricular tchycardia     Current Outpatient Medications  Medication Sig Dispense Refill   acetaminophen (TYLENOL) 500 MG tablet Take 500-1,000 mg by mouth every 6 (six) hours as needed for mild pain or moderate pain.     ALPRAZolam (XANAX) 0.5 MG tablet Take 0.25 mg by mouth at bedtime.     Ascorbic Acid (VITAMIN C) 1000 MG tablet Take 1,000 mg by mouth daily.     B Complex-C (SUPER B COMPLEX PO) Take 1 capsule by mouth at bedtime.     beta carotene 10000 UNIT capsule Take 10,000 Units by mouth daily.     Biotin 5 MG CAPS Take 5 mg by mouth at bedtime.      Calcium 600-200 MG-UNIT tablet Take 1 tablet by mouth daily.     cetirizine (ZYRTEC) 10 MG tablet Take 10 mg by mouth at bedtime.     Cholecalciferol (VITAMIN D) 125 MCG (5000 UT) CAPS Take 10,000 Units by mouth daily.     diphenhydrAMINE (BENADRYL) 25 MG tablet Take 25 mg by mouth every 6 (six) hours as needed for allergies.     docusate sodium (COLACE) 100 MG capsule Take 200 mg by mouth as needed.     ELIQUIS 5 MG TABS tablet Take 1 tablet by mouth twice a day 180 tablet 2   escitalopram (LEXAPRO) 20 MG tablet Take 20 mg by mouth at bedtime.     ezetimibe (ZETIA) 10 MG tablet Take 10 mg by mouth daily.     famotidine (PEPCID) 20 MG tablet Take 20 mg by mouth as needed for heartburn or indigestion.     flecainide (TAMBOCOR) 100  MG tablet Take 1 tablet by mouth twice a day and may take an additional tablet daily for increased palpitations 225 tablet 1   fluticasone (FLONASE) 50 MCG/ACT nasal spray Place 1 spray into both nostrils daily as needed for allergies.     furosemide (LASIX) 20 MG tablet Take 20 mg by mouth daily as needed for fluid (1-2lb weight gain overnight).     magnesium hydroxide (MILK OF MAGNESIA) 400 MG/5ML suspension Take 15 mLs by mouth daily as needed for mild constipation.     MANGANESE PO Take 40 mg by mouth at bedtime.  Melatonin 10 MG TABS Take 10 mg by mouth at bedtime.      omeprazole (PRILOSEC) 20 MG capsule Take 20 mg by mouth daily as needed (heartburn).     OVER THE COUNTER MEDICATION Take 1 capsule by mouth daily. Co Q10 100 mg with K2     Polyvinyl Alcohol (LIQUID TEARS OP) Place 1 drop into both eyes daily as needed (dry eyes).     ranolazine (RANEXA) 500 MG 12 hr tablet Take 1 tablet by mouth twice a day 180 tablet 2   Simethicone 125 MG CAPS Take 250 mg by mouth daily as needed (bloating).     Thiamine HCl (VITAMIN B1) 100 MG TABS Take 100 mg by mouth at bedtime.     thyroid (ARMOUR) 120 MG tablet Take 120 mg by mouth daily before breakfast.     Zinc 22.5 MG TABS Take 22.5 mg by mouth daily.     No current facility-administered medications for this visit.    Allergies:   Quinidine, Amoxicillin, Atenolol, Atorvastatin, Dofetilide, Latex, Nadolol, and Vicodin  [hydrocodone-acetaminophen]   Social History:  The patient  reports that she has never smoked. She has never been exposed to tobacco smoke. She has never used smokeless tobacco. She reports that she does not drink alcohol and does not use drugs.   Family History:  The patient's family history includes Arrhythmia in her brother; Basal cell carcinoma (age of onset: 2) in her mother; Breast cancer in an other family member; Breast cancer (age of onset: 27) in her maternal grandmother; Cancer in her maternal aunt; Heart attack in  her brother; Heart disease in her brother; Heart disease (age of onset: 58) in her father; Hyperlipidemia (age of onset: 66) in her mother; Hypertension in her brother; Leukemia in her maternal aunt; Lung cancer (age of onset: 35) in her maternal aunt; Osteoporosis in her mother; Other in her brother, father, and paternal aunt; Squamous cell carcinoma (age of onset: 65) in her brother.   ROS:  Please see the history of present illness.   Otherwise, review of systems is positive for none.   All other systems are reviewed and negative.   PHYSICAL EXAM: VS:  BP 132/70    Pulse 100    Ht 5' 4.5" (1.638 m)    Wt 149 lb 12.8 oz (67.9 kg)    SpO2 96%    BMI 25.32 kg/m  , BMI Body mass index is 25.32 kg/m. GEN: Well nourished, well developed, in no acute distress  HEENT: normal  Neck: no JVD, carotid bruits, or masses Cardiac: RRR; no murmurs, rubs, or gallops,no edema  Respiratory:  clear to auscultation bilaterally, normal work of breathing GI: soft, nontender, nondistended, + BS MS: no deformity or atrophy  Skin: warm and dry Neuro:  Strength and sensation are intact Psych: euthymic mood, full affect  EKG:  EKG is ordered today. Personal review of the ekg ordered shows sinus rhythm, PVCs  Recent Labs: 08/19/2021: BUN 20; Creatinine, Ser 0.85; Hemoglobin 13.8; Platelets 204; Potassium 4.1; Sodium 144    Lipid Panel  No results found for: CHOL, TRIG, HDL, CHOLHDL, VLDL, LDLCALC, LDLDIRECT   Wt Readings from Last 3 Encounters:  12/23/21 149 lb 12.8 oz (67.9 kg)  10/30/21 151 lb 9.6 oz (68.8 kg)  10/13/21 151 lb 12.8 oz (68.9 kg)      Other studies Reviewed: Additional studies/ records that were reviewed today include: TTE 9/14 /21 Review of the above records today demonstrates: .  1.  Left ventricular ejection fraction, by estimation, is 55 to 60%. The  left ventricle has normal function. The left ventricle has no regional  wall motion abnormalities. Left ventricular diastolic  parameters are  consistent with Grade I diastolic  dysfunction (impaired relaxation).   2. Right ventricular systolic function is normal. The right ventricular  size is normal. There is normal pulmonary artery systolic pressure. The  estimated right ventricular systolic pressure is 75.4 mmHg.   3. P2 prolpase with late systolic MR that is mild. There is mitral  annular disjunction noted which is associated with PVCs. 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 Mild mitral valve regurgitation.   4. The aortic valve is tricuspid. Aortic valve regurgitation is mild. No  aortic stenosis is present.   5. The inferior vena cava is normal in size with greater than 50%  respiratory variability, suggesting right atrial pressure of 3 mmHg.   6. Evidence of atrial level shunting detected by color flow Doppler.  There is a small patent foramen ovale with predominantly left to right  shunting across the atrial septum.   ASSESSMENT AND PLAN:  1.  Paroxysmal atrial fibrillation: Currently on flecainide 100 mg twice daily, diltiazem 120 mg daily, Eliquis 5 mg twice daily.  CHA2DS2-VASc of 2.  She is now status post ablation 09/12/2021.  Been in sinus rhythm without further episodes of atrial fibrillation.  She is happy with her control.  2.  PVCs: Currently on flecainide with minimal PVCs.  High risk medication monitoring for flecainide via ECG.  She does continue to have PVCs but has felt well.  She was feeling poorly [place] PVCs in December.  If they do come back and make her feel poorly, medication management would be her best option at this point.  3.  Hypertension: Currently well controlled  Current medicines are reviewed at length with the patient today.   The patient does not have concerns regarding her medicines.  The following changes were made today: None  Labs/ tests ordered today include:  Orders Placed This Encounter  Procedures   EKG 12-Lead     Disposition:   FU with Shacola Schussler 3  months  Signed, Luanna Weesner Meredith Leeds, MD  12/23/2021 9:04 AM     [address] [place] [address] [phone] (office) [phone] (fax)

## 2021-12-26 MED ORDER — DILTIAZEM HCL ER COATED BEADS 120 MG PO CP24: 120.0000 mg | ORAL_CAPSULE | Freq: Every day | ORAL | 3 refills | Status: DC

## 2022-01-20 DIAGNOSIS — L814 Other melanin hyperpigmentation: Secondary | ICD-10-CM | POA: Diagnosis not present

## 2022-01-20 DIAGNOSIS — D225 Melanocytic nevi of trunk: Secondary | ICD-10-CM | POA: Diagnosis not present

## 2022-01-20 DIAGNOSIS — D2239 Melanocytic nevi of other parts of face: Secondary | ICD-10-CM | POA: Diagnosis not present

## 2022-01-20 DIAGNOSIS — L821 Other seborrheic keratosis: Secondary | ICD-10-CM | POA: Diagnosis not present

## 2022-01-23 DIAGNOSIS — I10 Essential (primary) hypertension: Secondary | ICD-10-CM | POA: Diagnosis not present

## 2022-01-23 DIAGNOSIS — E785 Hyperlipidemia, unspecified: Secondary | ICD-10-CM | POA: Diagnosis not present

## 2022-01-27 DIAGNOSIS — Z170 Estrogen receptor positive status [ER+]: Secondary | ICD-10-CM | POA: Diagnosis not present

## 2022-01-27 DIAGNOSIS — C50111 Malignant neoplasm of central portion of right female breast: Secondary | ICD-10-CM | POA: Diagnosis not present

## 2022-02-09 ENCOUNTER — Other Ambulatory Visit: Payer: Self-pay | Admitting: Internal Medicine

## 2022-02-17 DIAGNOSIS — M8588 Other specified disorders of bone density and structure, other site: Secondary | ICD-10-CM | POA: Diagnosis not present

## 2022-02-17 DIAGNOSIS — M8589 Other specified disorders of bone density and structure, multiple sites: Secondary | ICD-10-CM | POA: Diagnosis not present

## 2022-03-03 DIAGNOSIS — E559 Vitamin D deficiency, unspecified: Secondary | ICD-10-CM | POA: Diagnosis not present

## 2022-03-03 DIAGNOSIS — I480 Paroxysmal atrial fibrillation: Secondary | ICD-10-CM | POA: Diagnosis not present

## 2022-03-03 DIAGNOSIS — I10 Essential (primary) hypertension: Secondary | ICD-10-CM | POA: Diagnosis not present

## 2022-03-03 DIAGNOSIS — Z1211 Encounter for screening for malignant neoplasm of colon: Secondary | ICD-10-CM | POA: Diagnosis not present

## 2022-03-03 DIAGNOSIS — Z853 Personal history of malignant neoplasm of breast: Secondary | ICD-10-CM | POA: Diagnosis not present

## 2022-03-03 DIAGNOSIS — R5383 Other fatigue: Secondary | ICD-10-CM | POA: Diagnosis not present

## 2022-03-03 DIAGNOSIS — G4700 Insomnia, unspecified: Secondary | ICD-10-CM | POA: Diagnosis not present

## 2022-03-03 DIAGNOSIS — E039 Hypothyroidism, unspecified: Secondary | ICD-10-CM | POA: Diagnosis not present

## 2022-03-03 DIAGNOSIS — M797 Fibromyalgia: Secondary | ICD-10-CM | POA: Diagnosis not present

## 2022-03-03 DIAGNOSIS — E785 Hyperlipidemia, unspecified: Secondary | ICD-10-CM | POA: Diagnosis not present

## 2022-03-04 DIAGNOSIS — H43813 Vitreous degeneration, bilateral: Secondary | ICD-10-CM | POA: Diagnosis not present

## 2022-03-10 DIAGNOSIS — Z1211 Encounter for screening for malignant neoplasm of colon: Secondary | ICD-10-CM | POA: Diagnosis not present

## 2022-03-10 DIAGNOSIS — Z1212 Encounter for screening for malignant neoplasm of rectum: Secondary | ICD-10-CM | POA: Diagnosis not present

## 2022-03-30 ENCOUNTER — Encounter: Payer: Self-pay | Admitting: Cardiology

## 2022-03-30 ENCOUNTER — Ambulatory Visit: Payer: PPO | Admitting: Cardiology

## 2022-03-30 VITALS — BP 120/66 | HR 81 | Ht 64.5 in | Wt 150.8 lb

## 2022-03-30 DIAGNOSIS — D6869 Other thrombophilia: Secondary | ICD-10-CM

## 2022-03-30 DIAGNOSIS — I493 Ventricular premature depolarization: Secondary | ICD-10-CM

## 2022-03-30 DIAGNOSIS — I480 Paroxysmal atrial fibrillation: Secondary | ICD-10-CM | POA: Diagnosis not present

## 2022-03-30 DIAGNOSIS — R931 Abnormal findings on diagnostic imaging of heart and coronary circulation: Secondary | ICD-10-CM

## 2022-03-30 MED ORDER — METOPROLOL SUCCINATE ER 50 MG PO TB24: 50.0000 mg | ORAL_TABLET | Freq: Every day | ORAL | 6 refills | Status: DC

## 2022-03-30 NOTE — Progress Notes (Signed)
Electrophysiology Office Note   Date:  03/30/2022   ID:  Patricia, Alvarado [birth year], MRN [id number]  PCP:  [name], NP  Cardiologist:   Primary Electrophysiologist: Gaye Alken, MD    Chief Complaint: AF   History of Present Illness: Patricia Alvarado is a 76 y.o. female who is being seen today for the evaluation of AF at the request of [name], [name], NP. Presenting today for electrophysiology evaluation.  The history significant hypertension, PVCs, nonsustained VT, atrial fibrillation.  She is currently on flecainide.  She is status post atrial fibrillation ablation 09/12/2021.  Today, denies symptoms of palpitations, chest pain, shortness of breath, orthopnea, PND, lower extremity edema, claudication, dizziness, presyncope, syncope, bleeding, or neurologic sequela. The patient is tolerating medications without difficulties.  Since being seen she has had no further episodes of atrial fibrillation.  She is quite happy with her atrial fibrillation control.  She does continue to have symptoms from her PVCs.  She has 3 PVCs on her ECG today.  She feels palpitations and quite poorly when she has PVCs.   Past Medical History:  Diagnosis Date   A-fib [facility])    Arthritis    thumb   Basal cell carcinoma 2001   "forehead, between eyebrows" right arm (2022)   Breast cancer ([place])    Carcinoma of thyroid gland ([place]) 2001   Chronic lower back pain    "worse is across my hips" (05/26/2016)   Depression    Dyslipidemia    Dyspnea    Family history of breast cancer    Family history of kidney cancer    Family history of leukemia    Family history of nonmelanoma skin cancer    Fibrocystic breast    Fibromyalgia    GERD (gastroesophageal reflux disease)    Heart murmur    History of blood transfusion 1992   "after subcutaneous mastectomies"   Hypertension    not on any medications   Hypothyroidism    Malignant melanoma of left ankle ([place]) 2001   Migraine    "visual;  2-3 times/year" (05/26/2016)   Mitral valve prolapse    PONV (postoperative nausea and vomiting)    Ventricular tachycardia (HCC)    Hx of, controlled on sotalol therapy   Past Surgical History:  Procedure Laterality Date   ABDOMINAL HYSTERECTOMY  1998   ANTERIOR CERVICAL DECOMP/DISCECTOMY FUSION  2001   ATRIAL FIBRILLATION ABLATION N/A 09/12/2021   Procedure: ATRIAL FIBRILLATION ABLATION;  Surgeon: Constance Haw, MD;  Location: [facility];  Service: Cardiovascular;  Laterality: N/A;   BACK SURGERY     BASAL CELL CARCINOMA EXCISION  2001   "cut it out & did a flap, on forehead between my eyebrows"   BREAST IMPLANT EXCHANGE Bilateral 2001   [id number]   BREAST IMPLANT EXCHANGE Left 05/05/2021   Procedure: BREAST IMPLANT EXCHANGE;  Surgeon: Wallace Going, DO;  Location: [facility];  Service: Plastics;  Laterality: Left;   BREAST IMPLANT REMOVAL Right 02/05/2020   Procedure: REMOVAL RIGHT BREAST IMPLANT AND CAPSULECTOMY;  Surgeon: Jovita Kussmaul, MD;  Location: [place];  Service: General;  Laterality: Right;   BREAST LUMPECTOMY Right 02/05/2020   Procedure: RIGHT BREAST CENTRAL LUMPECTOMY;  Surgeon: Jovita Kussmaul, MD;  Location: [facility];  Service: General;  Laterality: Right;   BREAST RECONSTRUCTION WITH PLACEMENT OF TISSUE EXPANDER AND FLEX HD (ACELLULAR HYDRATED DERMIS) Right 01/20/2021   Procedure: BREAST RECONSTRUCTION  WITH PLACEMENT OF TISSUE EXPANDER AND FLEX HD (ACELLULAR HYDRATED DERMIS);  Surgeon: Wallace Going, DO;  Location: [facility];  Service: Plastics;  Laterality: Right;  2 hours   CARPAL TUNNEL RELEASE Right 2006   COLONOSCOPY     DILATION AND CURETTAGE OF UTERUS  1970s X 2-3   ELECTROPHYSIOLOGIC STUDY  1994 X 2;2001   "to see it it was sustained VT; cause thyroid levels were causing arrhythmias"   EYE SURGERY  12/2020   cataracts   LAPAROSCOPIC CHOLECYSTECTOMY     MASTECTOMY Bilateral 1992   "subcutaneous"    MELANOMA EXCISION Left 2001   "ankle, stage I"   PLACEMENT OF BREAST IMPLANTS Bilateral 1992   [id number]   REMOVAL OF TISSUE EXPANDER AND PLACEMENT OF IMPLANT Right 05/05/2021   Procedure: REMOVAL OF TISSUE EXPANDER AND PLACEMENT OF IMPLANT;  Surgeon: Wallace Going, DO;  Location: [facility];  Service: Plastics;  Laterality: Right;   TOTAL THYROIDECTOMY  12/1999   "cancer"   VENTRICULAR ABLATION SURGERY  2011   ventricular tchycardia     Current Outpatient Medications  Medication Sig Dispense Refill   acetaminophen (TYLENOL) 500 MG tablet Take 500-1,000 mg by mouth every 6 (six) hours as needed for mild pain or moderate pain.     ALPRAZolam (XANAX) 0.5 MG tablet Take 0.25 mg by mouth at bedtime.     Ascorbic Acid (VITAMIN C) 1000 MG tablet Take 1,000 mg by mouth daily.     B Complex-C (SUPER B COMPLEX PO) Take 1 capsule by mouth at bedtime.     beta carotene 10000 UNIT capsule Take 10,000 Units by mouth daily.     Biotin 5 MG CAPS Take 5 mg by mouth at bedtime.      buPROPion ER (WELLBUTRIN SR) 100 MG 12 hr tablet Take 100 mg by mouth every morning.     Calcium 600-200 MG-UNIT tablet Take 1 tablet by mouth daily.     cetirizine (ZYRTEC) 10 MG tablet Take 10 mg by mouth at bedtime.     Cholecalciferol (VITAMIN D) 125 MCG (5000 UT) CAPS Take 10,000 Units by mouth daily.     diltiazem (CARDIZEM CD) 120 MG 24 hr capsule Take 1 capsule by mouth at bedtime 90 capsule 3   diphenhydrAMINE (BENADRYL) 25 MG tablet Take 25 mg by mouth every 6 (six) hours as needed for allergies.     docusate sodium (COLACE) 100 MG capsule Take 200 mg by mouth as needed.     ELIQUIS 5 MG TABS tablet Take 1 tablet by mouth twice a day 180 tablet 2   escitalopram (LEXAPRO) 20 MG tablet Take 20 mg by mouth at bedtime.     ezetimibe (ZETIA) 10 MG tablet Take 10 mg by mouth daily.     famotidine (PEPCID) 20 MG tablet Take 20 mg by mouth as needed for heartburn or indigestion.     flecainide (TAMBOCOR) 100 MG tablet  Take 1 tablet by mouth twice a day and may take an additional tablet daily for increased palpitations 225 tablet 1   fluticasone (FLONASE) 50 MCG/ACT nasal spray Place 1 spray into both nostrils daily as needed for allergies.     furosemide (LASIX) 20 MG tablet Take 20 mg by mouth daily as needed for fluid (1-2lb weight gain overnight).     magnesium hydroxide (MILK OF MAGNESIA) 400 MG/5ML suspension Take 15 mLs by mouth daily as needed for mild constipation.     MANGANESE PO Take 40 mg by  mouth at bedtime.     Melatonin 10 MG TABS Take 10 mg by mouth at bedtime.      methocarbamol (ROBAXIN) 500 MG tablet Take 1 tablet by mouth daily as needed.     omeprazole (PRILOSEC) 20 MG capsule Take 20 mg by mouth daily as needed (heartburn).     OVER THE COUNTER MEDICATION Take 1 capsule by mouth daily. Co Q10 100 mg with K2     Polyvinyl Alcohol (LIQUID TEARS OP) Place 1 drop into both eyes daily as needed (dry eyes).     ranolazine (RANEXA) 500 MG 12 hr tablet Take 1 tablet by mouth twice a day 180 tablet 2   Simethicone 125 MG CAPS Take 250 mg by mouth daily as needed (bloating).     Thiamine HCl (VITAMIN B1) 100 MG TABS Take 100 mg by mouth at bedtime.     thyroid (ARMOUR) 120 MG tablet Take 120 mg by mouth daily before breakfast.     Zinc 22.5 MG TABS Take 22.5 mg by mouth daily.     No current facility-administered medications for this visit.    Allergies:   Quinidine, Amoxicillin, Atenolol, Atorvastatin, Dofetilide, Latex, Nadolol, and Vicodin  [hydrocodone-acetaminophen]   Social History:  The patient  reports that she has never smoked. She has never been exposed to tobacco smoke. She has never used smokeless tobacco. She reports that she does not drink alcohol and does not use drugs.   Family History:  The patient's family history includes Arrhythmia in her brother; Basal cell carcinoma (age of onset: 95) in her mother; Breast cancer in an other family member; Breast cancer (age of onset: 51)  in her maternal grandmother; Cancer in her maternal aunt; Heart attack in her brother; Heart disease in her brother; Heart disease (age of onset: 38) in her father; Hyperlipidemia (age of onset: 24) in her mother; Hypertension in her brother; Leukemia in her maternal aunt; Lung cancer (age of onset: 58) in her maternal aunt; Osteoporosis in her mother; Other in her brother, father, and paternal aunt; Squamous cell carcinoma (age of onset: 82) in her brother.   ROS:  Please see the history of present illness.   Otherwise, review of systems is positive for none.   All other systems are reviewed and negative.   PHYSICAL EXAM: VS:  BP 120/66   Pulse 81   Ht 5' 4.5" (1.638 m)   Wt 150 lb 12.8 oz (68.4 kg)   SpO2 96%   BMI 25.49 kg/m  , BMI Body mass index is 25.49 kg/m. GEN: Well nourished, well developed, in no acute distress  HEENT: normal  Neck: no JVD, carotid bruits, or masses Cardiac: RRR; no murmurs, rubs, or gallops,no edema  Respiratory:  clear to auscultation bilaterally, normal work of breathing GI: soft, nontender, nondistended, + BS MS: no deformity or atrophy  Skin: warm and dry Neuro:  Strength and sensation are intact Psych: euthymic mood, full affect  EKG:  EKG is ordered today. Personal review of the ekg ordered shows sinus rhythm, PVC   Recent Labs: 08/19/2021: BUN 20; Creatinine, Ser 0.85; Hemoglobin 13.8; Platelets 204; Potassium 4.1; Sodium 144    Lipid Panel  No results found for: CHOL, TRIG, HDL, CHOLHDL, VLDL, LDLCALC, LDLDIRECT   Wt Readings from Last 3 Encounters:  03/30/22 150 lb 12.8 oz (68.4 kg)  12/23/21 149 lb 12.8 oz (67.9 kg)  10/30/21 151 lb 9.6 oz (68.8 kg)      Other studies Reviewed: Additional  studies/ records that were reviewed today include: TTE 9/14 /21 Review of the above records today demonstrates: .  1. Left ventricular ejection fraction, by estimation, is 55 to 60%. The  left ventricle has normal function. The left ventricle has  no regional  wall motion abnormalities. Left ventricular diastolic parameters are  consistent with Grade I diastolic  dysfunction (impaired relaxation).   2. Right ventricular systolic function is normal. The right ventricular  size is normal. There is normal pulmonary artery systolic pressure. The  estimated right ventricular systolic pressure is 41.6 mmHg.   3. P2 prolpase with late systolic MR that is mild. There is mitral  annular disjunction noted which is associated with PVCs. 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 Mild mitral valve regurgitation.   4. The aortic valve is tricuspid. Aortic valve regurgitation is mild. No  aortic stenosis is present.   5. The inferior vena cava is normal in size with greater than 50%  respiratory variability, suggesting right atrial pressure of 3 mmHg.   6. Evidence of atrial level shunting detected by color flow Doppler.  There is a small patent foramen ovale with predominantly left to right  shunting across the atrial septum.   ASSESSMENT AND PLAN:  1.  Paroxysmal atrial fibrillation: Currently on flecainide 100 mg twice daily, diltiazem 120 mg daily, Eliquis 5 mg twice daily.  CHA2DS2-VASc of 2.  High risk medication monitoring for flecainide.  Is status post atrial fibrillation ablation 09/12/2021.  She has not had any further episodes.  We Kaydan Wong continue with current management.  2.  PVCs: Currently on flecainide.  She unfortunately is symptomatic with her PVCs.  PVCs on her ECG today showed multiple morphologies.  Unfortunately she continues to be symptomatic from her PVCs.  She would like to hold off on making adjustments to her flecainide.  We Lilliam Chamblee stop diltiazem and start her on Toprol-XL 50 mg to see if this improves her symptoms.  3.  Hypertension: Currently well controlled  4.  Secondary hypercoagulable state: Currently on Eliquis for atrial fibrillation as above.  Current medicines are reviewed at length with the patient today.   The patient  does not have concerns regarding her medicines.  The following changes were made today: Stop diltiazem, start Toprol-XL  Labs/ tests ordered today include:  Orders Placed This Encounter  Procedures   EKG 12-Lead     Disposition:   FU with Fallan Mccarey 6 months  Signed, Isatu Macinnes Meredith Leeds, MD  03/30/2022 11:00 AM     [address] [place] [address] [phone] (office) [phone] (fax)

## 2022-03-30 NOTE — Patient Instructions (Addendum)
Medication Instructions:  Your physician has recommended you make the following change in your medication:  STOP Ranexa STOP Diltiazem START Metoprolol Succinate (Toprol) 50 mg once daily at bedtime  *If you need a refill on your cardiac medications before your next appointment, please call your pharmacy*   Lab Work: None ordered  If you have labs (blood work) drawn today and your tests are completely normal, you will receive your results only by: [place] (if you have MyChart) OR A paper copy in the mail If you have any lab test that is abnormal or we need to change your treatment, we will call you to review the results.   Testing/Procedures: None ordered   Follow-Up: At [facility], you and your health needs are our priority.  As part of our continuing mission to provide you with exceptional heart care, we have created designated Provider Care Teams.  These Care Teams include your primary Cardiologist (physician) and Advanced Practice Providers (APPs -  Physician Assistants and Nurse Practitioners) who all work together to provide you with the care you need, when you need it.  Your next appointment:   6 month(s)  The format for your next appointment:   In Person  Provider:   Allegra Lai, MD    Thank you for choosing [facility]!!   Trinidad Curet, RN [phone]  Other Instructions    Important Information About Sugar

## 2022-04-02 ENCOUNTER — Other Ambulatory Visit: Payer: Self-pay

## 2022-04-02 ENCOUNTER — Other Ambulatory Visit: Payer: PPO

## 2022-04-02 DIAGNOSIS — R931 Abnormal findings on diagnostic imaging of heart and coronary circulation: Secondary | ICD-10-CM

## 2022-04-03 LAB — LIPID PANEL
Chol/HDL Ratio: 3.3 ratio (ref 0.0–4.4)
Cholesterol, Total: 228 mg/dL — ABNORMAL HIGH (ref 100–199)
HDL: 70 mg/dL (ref >39)
LDL Chol Calc (NIH): 143 mg/dL — ABNORMAL HIGH (ref 0–99)
Triglycerides: 86 mg/dL (ref 0–149)
VLDL Cholesterol Cal: 15 mg/dL (ref 5–40)

## 2022-04-06 ENCOUNTER — Encounter: Payer: Self-pay | Admitting: Cardiology

## 2022-04-14 DIAGNOSIS — Z9181 History of falling: Secondary | ICD-10-CM | POA: Diagnosis not present

## 2022-04-14 DIAGNOSIS — F330 Major depressive disorder, recurrent, mild: Secondary | ICD-10-CM | POA: Diagnosis not present

## 2022-04-14 DIAGNOSIS — R21 Rash and other nonspecific skin eruption: Secondary | ICD-10-CM | POA: Diagnosis not present

## 2022-04-16 ENCOUNTER — Telehealth: Payer: Self-pay | Admitting: Cardiology

## 2022-04-16 DIAGNOSIS — L300 Nummular dermatitis: Secondary | ICD-10-CM | POA: Diagnosis not present

## 2022-04-16 DIAGNOSIS — D2362 Other benign neoplasm of skin of left upper limb, including shoulder: Secondary | ICD-10-CM | POA: Diagnosis not present

## 2022-04-16 DIAGNOSIS — D1722 Benign lipomatous neoplasm of skin and subcutaneous tissue of left arm: Secondary | ICD-10-CM | POA: Diagnosis not present

## 2022-04-16 NOTE — Telephone Encounter (Signed)
Patient was returning call for results 

## 2022-04-16 NOTE — Telephone Encounter (Signed)
Patricia Meredith Leeds, MD  04/04/2022  4:36 PM EDT     LDL elevated. Start crestor 40 mg     Called patient with results. Patient stated she cannot take any statin medications. Informed patient that we could see if we can get a referral to see lipid clinic to look at alternative medications. Patricia forward to Dr. Curt Bears.

## 2022-04-17 NOTE — Telephone Encounter (Signed)
Pt aware office will call to schedule her with pharmD to further discuss options. Patient verbalized understanding and agreeable to plan.    [place] please help arrange appt

## 2022-05-02 ENCOUNTER — Encounter: Payer: Self-pay | Admitting: Cardiology

## 2022-05-02 DIAGNOSIS — I4729 Other ventricular tachycardia: Secondary | ICD-10-CM

## 2022-05-05 DIAGNOSIS — C44519 Basal cell carcinoma of skin of other part of trunk: Secondary | ICD-10-CM | POA: Diagnosis not present

## 2022-05-08 DIAGNOSIS — C50911 Malignant neoplasm of unspecified site of right female breast: Secondary | ICD-10-CM | POA: Diagnosis not present

## 2022-05-08 DIAGNOSIS — Z9012 Acquired absence of left breast and nipple: Secondary | ICD-10-CM | POA: Diagnosis not present

## 2022-05-08 MED ORDER — RANOLAZINE ER 500 MG PO TB12
500.0000 mg | ORAL_TABLET | Freq: Two times a day (BID) | ORAL | 2 refills | Status: DC
  Filled 2022-12-16 – 2022-12-17 (×2): qty 180, 90d supply, fill #0

## 2022-05-08 MED ORDER — DILTIAZEM HCL ER 120 MG PO CP24
120.0000 mg | ORAL_CAPSULE | Freq: Every day | ORAL | 2 refills | Status: DC
  Filled 2022-12-16: qty 90, 90d supply, fill #0

## 2022-05-08 NOTE — Telephone Encounter (Signed)
Pt confirmed dosing.  Rx sent to requested pharmacy

## 2022-05-08 NOTE — Telephone Encounter (Signed)
Left message to call back  

## 2022-05-12 ENCOUNTER — Ambulatory Visit: Payer: PPO | Admitting: Pharmacist

## 2022-05-12 ENCOUNTER — Encounter: Payer: Self-pay | Admitting: Pharmacist

## 2022-05-12 DIAGNOSIS — E7801 Familial hypercholesterolemia: Secondary | ICD-10-CM | POA: Diagnosis not present

## 2022-05-12 NOTE — Progress Notes (Signed)
Patient ID: Patricia Alvarado                 DOB: 11/08/1945                    MRN: [id number]     HPI: Louvinia Cumbo is a 76 y.o. female patient referred to lipid clinic by Dr. Curt Bears. PMH is significant for  hypertension, PVCs, nonsustained V, breast cancer, atrial fibrillation. Aortic atherosclerosis, in addition to left main and 2 vessel coronary artery disease seen on CT in 2022. CAC- score 496. Latest LDL-C was 143 on ezetimibe '10mg'$  daily. She was referred to lipid clinic as patient reported being unable to take statins.  Patient presents today to lipid clinic. She reports having issues with cholesterol and arrythmias since her 20's. States that her LDL-C has been >200 previously. Had an NMR done in 2016 that showed elevated particle number but large LDL. She is on Eliquis. Already in the coverage gap. Between her and her husband has large amount of medical bills. States she has tried about 5 statins. Did ok on rosuvastatin '5mg'$  once a week. But did not tolerate when they tried to increase dose. Her PVC's and VT are very bothersome had both afib and VT ablations. More concerned about her arrhythmias than her cholesterol. Gets a little emotional talking about her father who died of an arrhythmia shoveling snow. She gets nauseated when her HR is elevated therefore exercise is limited.  Current Medications: ezetimibe '10mg'$  daily Intolerances: atorvastatin (muscle pains), rosuvastatin '5mg'$ , niacin  Risk Factors: CAD on CT LDL goal: <70  Diet: not discussed today  Exercise: use to exercise prior to cancer and Afib, not much now due to nausea with elevated HR, house work for 15 min and then has to rest  Family History: The patient's family history includes Arrhythmia in her brother; Basal cell carcinoma (age of onset: 88) in her mother; Breast cancer in an other family member; Breast cancer (age of onset: 63) in her maternal grandmother; Cancer in her maternal aunt; Heart attack in her brother;  Heart disease in her brother; Heart disease (age of onset: 59) in her father; Hyperlipidemia (age of onset: 41) in her mother; Hypertension in her brother; Leukemia in her maternal aunt; Lung cancer (age of onset: 39) in her maternal aunt; Osteoporosis in her mother; Other in her brother, father, and paternal aunt; Squamous cell carcinoma (age of onset: 6) in her brother.   Social History: The patient  reports that she has never smoked. She has never been exposed to tobacco smoke. She has never used smokeless tobacco. She reports that she does not drink alcohol and does not use drugs.   Labs: 04/02/22 TC 288, TG 86, LDL-C 143 (ezetimibe '10mg'$  daily)  Past Medical History:  Diagnosis Date   A-fib ([place])    Arthritis    thumb   Basal cell carcinoma 2001   "forehead, between eyebrows" right arm (2022)   Breast cancer ([place])    Carcinoma of thyroid gland ([place]) 2001   Chronic lower back pain    "worse is across my hips" (05/26/2016)   Depression    Dyslipidemia    Dyspnea    Family history of breast cancer    Family history of kidney cancer    Family history of leukemia    Family history of nonmelanoma skin cancer    Fibrocystic breast    Fibromyalgia    GERD (gastroesophageal reflux disease)  Heart murmur    History of blood transfusion 1992   "after subcutaneous mastectomies"   Hypertension    not on any medications   Hypothyroidism    Malignant melanoma of left ankle ([place]) 2001   Migraine    "visual; 2-3 times/year" (05/26/2016)   Mitral valve prolapse    PONV (postoperative nausea and vomiting)    Ventricular tachycardia (HCC)    Hx of, controlled on sotalol therapy    Current Outpatient Medications on File Prior to Visit  Medication Sig Dispense Refill   acetaminophen (TYLENOL) 500 MG tablet Take 500-1,000 mg by mouth every 6 (six) hours as needed for mild pain or moderate pain.     ALPRAZolam (XANAX) 0.5 MG tablet Take 0.25 mg by mouth at bedtime.     Ascorbic Acid (VITAMIN  C) 1000 MG tablet Take 1,000 mg by mouth daily.     B Complex-C (SUPER B COMPLEX PO) Take 1 capsule by mouth at bedtime.     beta carotene 10000 UNIT capsule Take 10,000 Units by mouth daily.     Biotin 5 MG CAPS Take 5 mg by mouth at bedtime.      buPROPion ER (WELLBUTRIN SR) 100 MG 12 hr tablet Take 100 mg by mouth every morning.     Calcium 600-200 MG-UNIT tablet Take 1 tablet by mouth daily.     cetirizine (ZYRTEC) 10 MG tablet Take 10 mg by mouth at bedtime.     Cholecalciferol (VITAMIN D) 125 MCG (5000 UT) CAPS Take 10,000 Units by mouth daily.     diltiazem (DILT-XR) 120 MG 24 hr capsule Take 1 capsule (120 mg total) by mouth daily. 90 capsule 2   diphenhydrAMINE (BENADRYL) 25 MG tablet Take 25 mg by mouth every 6 (six) hours as needed for allergies.     docusate sodium (COLACE) 100 MG capsule Take 200 mg by mouth as needed.     ELIQUIS 5 MG TABS tablet Take 1 tablet by mouth twice a day 180 tablet 2   escitalopram (LEXAPRO) 20 MG tablet Take 20 mg by mouth at bedtime.     ezetimibe (ZETIA) 10 MG tablet Take 10 mg by mouth daily.     famotidine (PEPCID) 20 MG tablet Take 20 mg by mouth as needed for heartburn or indigestion.     flecainide (TAMBOCOR) 100 MG tablet Take 1 tablet by mouth twice a day and may take an additional tablet daily for increased palpitations 225 tablet 1   fluticasone (FLONASE) 50 MCG/ACT nasal spray Place 1 spray into both nostrils daily as needed for allergies.     furosemide (LASIX) 20 MG tablet Take 20 mg by mouth daily as needed for fluid (1-2lb weight gain overnight).     magnesium hydroxide (MILK OF MAGNESIA) 400 MG/5ML suspension Take 15 mLs by mouth daily as needed for mild constipation.     MANGANESE PO Take 40 mg by mouth at bedtime.     Melatonin 10 MG TABS Take 10 mg by mouth at bedtime.      methocarbamol (ROBAXIN) 500 MG tablet Take 1 tablet by mouth daily as needed.     omeprazole (PRILOSEC) 20 MG capsule Take 20 mg by mouth daily as needed  (heartburn).     OVER THE COUNTER MEDICATION Take 1 capsule by mouth daily. Co Q10 100 mg with K2     Polyvinyl Alcohol (LIQUID TEARS OP) Place 1 drop into both eyes daily as needed (dry eyes).     ranolazine (  RANEXA) 500 MG 12 hr tablet Take 1 tablet (500 mg total) by mouth 2 (two) times daily. 180 tablet 2   Simethicone 125 MG CAPS Take 250 mg by mouth daily as needed (bloating).     Thiamine HCl (VITAMIN B1) 100 MG TABS Take 100 mg by mouth at bedtime.     thyroid (ARMOUR) 120 MG tablet Take 120 mg by mouth daily before breakfast.     Zinc 22.5 MG TABS Take 22.5 mg by mouth daily.     No current facility-administered medications on file prior to visit.    Allergies  Allergen Reactions   Quinidine Palpitations    REACTION: increased heart rate   Amoxicillin Itching and Rash   Atenolol Hives   Atorvastatin Other (See Comments)    REACTION: muscles pain   Dofetilide Other (See Comments)    Elevated QTCs   Latex Rash    Raw, rash and blisters - Tape only   Nadolol Hives   Vicodin  [Hydrocodone-Acetaminophen] Swelling    Facial redness and swelling    Assessment/Plan:  1. Hyperlipidemia - LDL-C is above goal of <70 due to CAC score >400. We discussed PCSK9i, but it is cost prohibitive. I gave patient patient assistance application to fill out. She will think about it and if she decides she will mail it back to me. Reviewed injection technique and side effects. Marion Downer would also be cost prohibitive. Nexletol would be less effective and cost prohibitive. If we do not pursue PCSK9i, then only option would be to re challenge with rosuvastatin '5mg'$  weekly.    Thank you,   Ramond Dial, Pharm.D, BCPS, CPP [place]  [address] [place], [place] [address]  Phone: [phone]; Fax: [phone]

## 2022-05-12 NOTE — Patient Instructions (Signed)
If you would like to pursue Repatha please fill out patient assistance application You can mail back to  [address] [place] [address]  Or drop off at [place] office to be faxed to me

## 2022-05-28 ENCOUNTER — Telehealth: Payer: Self-pay | Admitting: Pharmacist

## 2022-05-28 DIAGNOSIS — E7801 Familial hypercholesterolemia: Secondary | ICD-10-CM

## 2022-05-28 NOTE — Telephone Encounter (Signed)
Pt approved for SNF though [employer].She will go to get labs done at [place] office at the end of Oct. Labs entered.

## 2022-06-09 ENCOUNTER — Encounter: Payer: Self-pay | Admitting: Pharmacist

## 2022-06-09 NOTE — Telephone Encounter (Signed)
Repatha has been added to pt's med list.

## 2022-06-18 ENCOUNTER — Other Ambulatory Visit: Payer: Self-pay

## 2022-06-18 DIAGNOSIS — I4729 Other ventricular tachycardia: Secondary | ICD-10-CM

## 2022-06-18 MED ORDER — FLECAINIDE ACETATE 100 MG PO TABS: ORAL_TABLET | ORAL | 3 refills | Status: DC

## 2022-06-18 MED ORDER — APIXABAN 5 MG PO TABS: 5.0000 mg | ORAL_TABLET | Freq: Two times a day (BID) | ORAL | 0 refills | Status: DC

## 2022-06-18 NOTE — Telephone Encounter (Signed)
Prescription refill request for Eliquis received. Indication:Afib Last office visit:7/23 Scr:0.8 Age: 76 Weight:68.4 kg  Prescription refilled

## 2022-06-30 ENCOUNTER — Encounter: Payer: Self-pay | Admitting: Pharmacist

## 2022-07-02 ENCOUNTER — Encounter: Payer: Self-pay | Admitting: Cardiology

## 2022-07-03 ENCOUNTER — Other Ambulatory Visit: Payer: Self-pay | Admitting: *Deleted

## 2022-07-03 DIAGNOSIS — I493 Ventricular premature depolarization: Secondary | ICD-10-CM

## 2022-07-03 DIAGNOSIS — I480 Paroxysmal atrial fibrillation: Secondary | ICD-10-CM

## 2022-07-03 NOTE — Telephone Encounter (Signed)
Pt informed d/t multiple morphology PVC that ablation is not an option. Informed Dr. Curt Bears recommends 2 week zio. Pt agreeable and aware I will send information via mychart.

## 2022-07-03 NOTE — Progress Notes (Signed)
[id number]

## 2022-07-06 ENCOUNTER — Ambulatory Visit: Payer: PPO | Attending: Cardiology

## 2022-07-06 DIAGNOSIS — I493 Ventricular premature depolarization: Secondary | ICD-10-CM

## 2022-07-06 NOTE — Progress Notes (Unsigned)
Enrolled for [employer] to mail a ZIO XT long term holter monitor to the patients address on file.  

## 2022-07-07 DIAGNOSIS — I493 Ventricular premature depolarization: Secondary | ICD-10-CM | POA: Diagnosis not present

## 2022-07-15 ENCOUNTER — Encounter: Payer: Self-pay | Admitting: Pharmacist

## 2022-07-22 DIAGNOSIS — I493 Ventricular premature depolarization: Secondary | ICD-10-CM | POA: Diagnosis not present

## 2022-07-25 DIAGNOSIS — E785 Hyperlipidemia, unspecified: Secondary | ICD-10-CM | POA: Diagnosis not present

## 2022-07-25 DIAGNOSIS — I480 Paroxysmal atrial fibrillation: Secondary | ICD-10-CM | POA: Diagnosis not present

## 2022-07-27 ENCOUNTER — Encounter: Payer: Self-pay | Admitting: Cardiology

## 2022-07-27 NOTE — Telephone Encounter (Signed)
PVC burden still elevated. Please arrange OV

## 2022-07-28 DIAGNOSIS — C50111 Malignant neoplasm of central portion of right female breast: Secondary | ICD-10-CM | POA: Diagnosis not present

## 2022-07-28 DIAGNOSIS — Z170 Estrogen receptor positive status [ER+]: Secondary | ICD-10-CM | POA: Diagnosis not present

## 2022-07-29 ENCOUNTER — Ambulatory Visit: Payer: PPO | Attending: Cardiology | Admitting: Cardiology

## 2022-07-29 ENCOUNTER — Encounter: Payer: Self-pay | Admitting: Cardiology

## 2022-07-29 VITALS — BP 124/68 | HR 89 | Ht 64.5 in | Wt 148.4 lb

## 2022-07-29 DIAGNOSIS — I480 Paroxysmal atrial fibrillation: Secondary | ICD-10-CM | POA: Diagnosis not present

## 2022-07-29 DIAGNOSIS — I493 Ventricular premature depolarization: Secondary | ICD-10-CM | POA: Diagnosis not present

## 2022-07-29 DIAGNOSIS — D6869 Other thrombophilia: Secondary | ICD-10-CM | POA: Diagnosis not present

## 2022-07-29 MED ORDER — MEXILETINE HCL 250 MG PO CAPS: 250.0000 mg | ORAL_CAPSULE | Freq: Two times a day (BID) | ORAL | 3 refills | Status: DC

## 2022-07-29 NOTE — Patient Instructions (Signed)
Medication Instructions:  Your physician has recommended you make the following change in your medication:  1) STOP taking flecainide  2) START taking mexiletine 250 mg twice daily  *If you need a refill on your cardiac medications before your next appointment, please call your pharmacy*  Follow-Up: At [facility], you and your health needs are our priority.  As part of our continuing mission to provide you with exceptional heart care, we have created designated Provider Care Teams.  These Care Teams include your primary Cardiologist (physician) and Advanced Practice Providers (APPs -  Physician Assistants and Nurse Practitioners) who all work together to provide you with the care you need, when you need it.  Your next appointment:   1 month(s)  The format for your next appointment:   In Person  Provider:   You will see one of the following Advanced Practice Providers on your designated Care Team:   Tommye Standard, [place] "Jonni Sanger" Leith, PA-C  Then, Will Meredith Leeds, MD will plan to see you again in 3 month(s).

## 2022-07-29 NOTE — Progress Notes (Signed)
Electrophysiology Office Note   Date:  07/29/2022   ID:  Holley, Kocurek 11-Mar-1946, MRN [id number]  PCP:  [name], NP  Cardiologist:   Primary Electrophysiologist: Gaye Alken, MD    Chief Complaint: AF   History of Present Illness: Patricia Alvarado is a 76 y.o. female who is being seen today for the evaluation of AF at the request of [name], [name], NP. Presenting today for electrophysiology evaluation.  She has a history significant for hypertension, PVCs, nonsustained VT, atrial fibrillation.  She is currently on flecainide.  She is status post atrial fibrillation ablation 09/12/2021.  She recently wore a monitor for PVCs.  Monitor showed a 12.6% supraventricular ectopy burden and 1% ventricular ectopy burden.  That being said on review of the monitor, it actually appears that she is having likely a 12.6% ventricular ectopy burden.  Today, denies symptoms of palpitations, chest pain, shortness of breath, orthopnea, PND, lower extremity edema, claudication, dizziness, presyncope, syncope, bleeding, or neurologic sequela. The patient is tolerating medications without difficulties.  She can feel her PVCs.  She does not have chest pain or shortness of breath but does note palpitations when she is having them.  She is not having them today.  She is quite frustrated as she is also quite a bit fatigued.  She blames her level of fatigue on PVCs.   Past Medical History:  Diagnosis Date   A-fib [facility])    Arthritis    thumb   Basal cell carcinoma 2001   "forehead, between eyebrows" right arm (2022)   Breast cancer ([place])    Carcinoma of thyroid gland ([place]) 2001   Chronic lower back pain    "worse is across my hips" (05/26/2016)   Depression    Dyslipidemia    Dyspnea    Family history of breast cancer    Family history of kidney cancer    Family history of leukemia    Family history of nonmelanoma skin cancer    Fibrocystic breast    Fibromyalgia    GERD  (gastroesophageal reflux disease)    Heart murmur    History of blood transfusion 1992   "after subcutaneous mastectomies"   Hypertension    not on any medications   Hypothyroidism    Malignant melanoma of left ankle ([place]) 2001   Migraine    "visual; 2-3 times/year" (05/26/2016)   Mitral valve prolapse    PONV (postoperative nausea and vomiting)    Ventricular tachycardia (HCC)    Hx of, controlled on sotalol therapy   Past Surgical History:  Procedure Laterality Date   ABDOMINAL HYSTERECTOMY  1998   ANTERIOR CERVICAL DECOMP/DISCECTOMY FUSION  2001   ATRIAL FIBRILLATION ABLATION N/A 09/12/2021   Procedure: ATRIAL FIBRILLATION ABLATION;  Surgeon: Constance Haw, MD;  Location: [place] [facility];  Service: Cardiovascular;  Laterality: N/A;   BACK SURGERY     BASAL CELL CARCINOMA EXCISION  2001   "cut it out & did a flap, on forehead between my eyebrows"   BREAST IMPLANT EXCHANGE Bilateral 2001   [id number]   BREAST IMPLANT EXCHANGE Left 05/05/2021   Procedure: BREAST IMPLANT EXCHANGE;  Surgeon: Wallace Going, DO;  Location: [place];  Service: Plastics;  Laterality: Left;   BREAST IMPLANT REMOVAL Right 02/05/2020   Procedure: REMOVAL RIGHT BREAST IMPLANT AND CAPSULECTOMY;  Surgeon: Jovita Kussmaul, MD;  Location: [place];  Service: General;  Laterality: Right;   BREAST LUMPECTOMY Right  02/05/2020   Procedure: RIGHT BREAST CENTRAL LUMPECTOMY;  Surgeon: Jovita Kussmaul, MD;  Location: [place];  Service: General;  Laterality: Right;   BREAST RECONSTRUCTION WITH PLACEMENT OF TISSUE EXPANDER AND FLEX HD (ACELLULAR HYDRATED DERMIS) Right 01/20/2021   Procedure: BREAST RECONSTRUCTION WITH PLACEMENT OF TISSUE EXPANDER AND FLEX HD (ACELLULAR HYDRATED DERMIS);  Surgeon: Wallace Going, DO;  Location: [place];  Service: Plastics;  Laterality: Right;  2 hours   CARPAL TUNNEL RELEASE Right 2006   COLONOSCOPY     DILATION AND CURETTAGE OF UTERUS  1970s  X 2-3   ELECTROPHYSIOLOGIC STUDY  1994 X 2;2001   "to see it it was sustained VT; cause thyroid levels were causing arrhythmias"   EYE SURGERY  12/2020   cataracts   LAPAROSCOPIC CHOLECYSTECTOMY     MASTECTOMY Bilateral 1992   "subcutaneous"   MELANOMA EXCISION Left 2001   "ankle, stage I"   PLACEMENT OF BREAST IMPLANTS Bilateral 1992   [id number]   REMOVAL OF TISSUE EXPANDER AND PLACEMENT OF IMPLANT Right 05/05/2021   Procedure: REMOVAL OF TISSUE EXPANDER AND PLACEMENT OF IMPLANT;  Surgeon: Wallace Going, DO;  Location: [place];  Service: Plastics;  Laterality: Right;   TOTAL THYROIDECTOMY  12/1999   "cancer"   VENTRICULAR ABLATION SURGERY  2011   ventricular tchycardia     Current Outpatient Medications  Medication Sig Dispense Refill   acetaminophen (TYLENOL) 500 MG tablet Take 500-1,000 mg by mouth every 6 (six) hours as needed for mild pain or moderate pain.     ALPRAZolam (XANAX) 0.5 MG tablet Take 0.25 mg by mouth at bedtime.     apixaban (ELIQUIS) 5 MG TABS tablet Take 1 tablet (5 mg total) by mouth 2 (two) times daily. 180 tablet 1   Ascorbic Acid (VITAMIN C) 1000 MG tablet Take 1,000 mg by mouth daily.     B Complex-C (SUPER B COMPLEX PO) Take 1 capsule by mouth at bedtime.     beta carotene 10000 UNIT capsule Take 10,000 Units by mouth daily.     Biotin 5 MG CAPS Take 5 mg by mouth at bedtime.      Calcium 600-200 MG-UNIT tablet Take 1 tablet by mouth daily.     cetirizine (ZYRTEC) 10 MG tablet Take 10 mg by mouth at bedtime.     Cholecalciferol (VITAMIN D) 125 MCG (5000 UT) CAPS Take 10,000 Units by mouth daily.     diltiazem (DILT-XR) 120 MG 24 hr capsule Take 1 capsule (120 mg total) by mouth daily. 90 capsule 2   diphenhydrAMINE (BENADRYL) 25 MG tablet Take 25 mg by mouth every 6 (six) hours as needed for allergies.     docusate sodium (COLACE) 100 MG capsule Take 200 mg by mouth as needed.     escitalopram (LEXAPRO) 20 MG tablet Take 20 mg by mouth at bedtime.      Evolocumab (REPATHA SURECLICK) 626 MG/ML SOAJ Inject 1 pen  into the skin every 14 (fourteen) days.     ezetimibe (ZETIA) 10 MG tablet Take 10 mg by mouth daily.     famotidine (PEPCID) 20 MG tablet Take 20 mg by mouth as needed for heartburn or indigestion.     fluticasone (FLONASE) 50 MCG/ACT nasal spray Place 1 spray into both nostrils daily as needed for allergies.     furosemide (LASIX) 20 MG tablet Take 20 mg by mouth daily as needed for fluid (1-2lb weight gain overnight).     magnesium hydroxide (  MILK OF MAGNESIA) 400 MG/5ML suspension Take 15 mLs by mouth daily as needed for mild constipation.     MANGANESE PO Take 40 mg by mouth at bedtime.     Melatonin 10 MG TABS Take 10 mg by mouth at bedtime.      methocarbamol (ROBAXIN) 500 MG tablet Take 1 tablet by mouth daily as needed.     mexiletine (MEXITIL) 250 MG capsule Take 1 capsule (250 mg total) by mouth 2 (two) times daily. 180 capsule 3   Niacinamide-Zn-Cu-Methfo-Se-Cr (NICOTINAMIDE PO) Take 500 mg by mouth 2 (two) times daily.     omeprazole (PRILOSEC) 20 MG capsule Take 20 mg by mouth daily as needed (heartburn).     OVER THE COUNTER MEDICATION Take 1 capsule by mouth daily. Co Q10 100 mg with K2     Polyvinyl Alcohol (LIQUID TEARS OP) Place 1 drop into both eyes daily as needed (dry eyes).     ranolazine (RANEXA) 500 MG 12 hr tablet Take 1 tablet (500 mg total) by mouth 2 (two) times daily. 180 tablet 2   Simethicone 125 MG CAPS Take 250 mg by mouth daily as needed (bloating).     Thiamine HCl (VITAMIN B1) 100 MG TABS Take 100 mg by mouth at bedtime.     thyroid (ARMOUR) 120 MG tablet Take 120 mg by mouth daily before breakfast.     Zinc 22.5 MG TABS Take 22.5 mg by mouth daily.     No current facility-administered medications for this visit.    Allergies:   Quinidine, Amoxicillin, Atenolol, Atorvastatin, Dofetilide, Latex, Nadolol, and Vicodin  [hydrocodone-acetaminophen]   Social History:  The patient  reports that she  has never smoked. She has never been exposed to tobacco smoke. She has never used smokeless tobacco. She reports that she does not drink alcohol and does not use drugs.   Family History:  The patient's family history includes Arrhythmia in her brother; Basal cell carcinoma (age of onset: 25) in her mother; Breast cancer in an other family member; Breast cancer (age of onset: 47) in her maternal grandmother; Cancer in her maternal aunt; Heart attack in her brother; Heart disease in her brother; Heart disease (age of onset: 66) in her father; Hyperlipidemia (age of onset: 8) in her mother; Hypertension in her brother; Leukemia in her maternal aunt; Lung cancer (age of onset: 65) in her maternal aunt; Osteoporosis in her mother; Other in her brother, father, and paternal aunt; Squamous cell carcinoma (age of onset: 55) in her brother.   ROS:  Please see the history of present illness.   Otherwise, review of systems is positive for none.   All other systems are reviewed and negative.   PHYSICAL EXAM: VS:  BP 124/68   Pulse 89   Ht 5' 4.5" (1.638 m)   Wt 148 lb 6.4 oz (67.3 kg)   SpO2 99%   BMI 25.08 kg/m  , BMI Body mass index is 25.08 kg/m. GEN: Well nourished, well developed, in no acute distress  HEENT: normal  Neck: no JVD, carotid bruits, or masses Cardiac: RRR; no murmurs, rubs, or gallops,no edema  Respiratory:  clear to auscultation bilaterally, normal work of breathing GI: soft, nontender, nondistended, + BS MS: no deformity or atrophy  Skin: warm and dry Neuro:  Strength and sensation are intact Psych: euthymic mood, full affect  EKG:  EKG is ordered today. Personal review of the ekg ordered shows sinus rhythm  Recent Labs: 08/19/2021: BUN 20; Creatinine, Ser  0.85; Hemoglobin 13.8; Platelets 204; Potassium 4.1; Sodium 144    Lipid Panel     Component Value Date/Time   CHOL 228 (H) 04/02/2022 1017   TRIG 86 04/02/2022 1017   HDL 70 04/02/2022 1017   CHOLHDL 3.3 04/02/2022  1017   LDLCALC 143 (H) 04/02/2022 1017     Wt Readings from Last 3 Encounters:  07/29/22 148 lb 6.4 oz (67.3 kg)  03/30/22 150 lb 12.8 oz (68.4 kg)  12/23/21 149 lb 12.8 oz (67.9 kg)      Other studies Reviewed: Additional studies/ records that were reviewed today include: TTE 9/14 /21 Review of the above records today demonstrates:  1. Left ventricular ejection fraction, by estimation, is 55 to 60%. The  left ventricle has normal function. The left ventricle has no regional  wall motion abnormalities. Left ventricular diastolic parameters are  consistent with Grade I diastolic  dysfunction (impaired relaxation).   2. Right ventricular systolic function is normal. The right ventricular  size is normal. There is normal pulmonary artery systolic pressure. The  estimated right ventricular systolic pressure is 70.6 mmHg.   3. P2 prolpase with late systolic MR that is mild. There is mitral  annular disjunction noted which is associated with PVCs. 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 Mild mitral valve regurgitation.   4. The aortic valve is tricuspid. Aortic valve regurgitation is mild. No  aortic stenosis is present.   5. The inferior vena cava is normal in size with greater than 50%  respiratory variability, suggesting right atrial pressure of 3 mmHg.   6. Evidence of atrial level shunting detected by color flow Doppler.  There is a small patent foramen ovale with predominantly left to right  shunting across the atrial septum.  Cardiac monitor 07/23/2022 personally reviewed Predominant rhythm was sinus rhythm 12.6% supraventricular ectopy Less than 1% ventricular ectopy Triggered episodes associated with PVCs  ASSESSMENT AND PLAN:  1.  Paroxysmal atrial fibrillation: Currently on flecainide 100 mg twice daily, diltiazem 120 mg daily, Eliquis 5 mg twice daily.  CHA2DS2-VASc of 2.  High risk medication monitoring for flecainide.  Status post ablation 09/12/2021.  He is remained in  sinus rhythm.  We Adebayo Ensminger plan to stop flecainide today and start mexiletine for PVCs as below.  2.  PVCs: Currently on flecainide.  She is symptomatic with her PVCs.  PVC burden was elevated on her last monitor at 12.6%.  She is unfortunate continue to have episodes of symptomatic palpitations that she attributes to her PVCs.  It appears that her flecainide is not controlling her symptoms.  We Francely Craw start mexiletine to 50 mg twice daily.  3.  Hypertension: Currently well controlled  4.  Secondary hypercoagulable state: Currently on Eliquis for atrial fibrillation as above.   Current medicines are reviewed at length with the patient today.   The patient does not have concerns regarding her medicines.  The following changes were made today: Start mexiletine  Labs/ tests ordered today include:  Orders Placed This Encounter  Procedures   EKG 12-Lead     Disposition:   FU with Halen Mossbarger 3 months  Signed, Acen Craun Meredith Leeds, MD  07/29/2022 9:40 AM     [address] [address] [place] [address] [phone] (office) [phone] (fax)

## 2022-07-31 ENCOUNTER — Encounter: Payer: Self-pay | Admitting: Cardiology

## 2022-08-01 ENCOUNTER — Other Ambulatory Visit: Payer: Self-pay | Admitting: General Surgery

## 2022-08-01 DIAGNOSIS — C50411 Malignant neoplasm of upper-outer quadrant of right female breast: Secondary | ICD-10-CM

## 2022-08-02 ENCOUNTER — Telehealth: Payer: Self-pay | Admitting: Cardiology

## 2022-08-02 NOTE — Telephone Encounter (Signed)
Patient called the answering service this afternoon concerned that her HR was in the 40s on pulse ox. She has a long history of PVCs and is followed by Dr. Curt Bears. She had an appointment on 10/4, where Dr. Curt Bears stopped flecainide and started mexiletine for PVCs. Patient did not start the mexiletine until 10/5. Since seeing Dr. Curt Bears, she has felt like she is having more PVCs than usual. She takes her HR on a pulse ox, which has been showing her HR is between 40-45BPM. She feels weaker and more fatigued than usual, and can feel some palpitations in her chest. Denies any syncope/near syncope.   I encouraged her to continue the mexiletine and her current medical regiment. Reiterated that if she has syncope or near syncopal episodes, she should seek emergent medial care. I offered to message our office to arrange an outpatient appointment this week, which patient appreciates.   Margie Billet, PA-C 08/02/2022 2:41 PM

## 2022-08-03 NOTE — Telephone Encounter (Signed)
Followed up with patient per Dr. Curt Bears request. Pt informed that he recommended switching to Propafenone 325 mg BID. Pt would like to hold off for now on switching.  She would like to give this new medication some more time before making a decision. She will either call the office or send a mychart message if she would like to readdress this prior to her 11/6 follow up appt. She appreciates my follow up call.

## 2022-08-04 DIAGNOSIS — E785 Hyperlipidemia, unspecified: Secondary | ICD-10-CM | POA: Diagnosis not present

## 2022-08-04 DIAGNOSIS — F330 Major depressive disorder, recurrent, mild: Secondary | ICD-10-CM | POA: Diagnosis not present

## 2022-08-04 DIAGNOSIS — I10 Essential (primary) hypertension: Secondary | ICD-10-CM | POA: Diagnosis not present

## 2022-08-04 DIAGNOSIS — I493 Ventricular premature depolarization: Secondary | ICD-10-CM | POA: Diagnosis not present

## 2022-08-04 DIAGNOSIS — E039 Hypothyroidism, unspecified: Secondary | ICD-10-CM | POA: Diagnosis not present

## 2022-08-04 DIAGNOSIS — R7309 Other abnormal glucose: Secondary | ICD-10-CM | POA: Diagnosis not present

## 2022-08-04 NOTE — Telephone Encounter (Signed)
See 10/8 telephone note

## 2022-08-06 ENCOUNTER — Encounter: Payer: Self-pay | Admitting: Cardiology

## 2022-08-10 ENCOUNTER — Telehealth: Payer: Self-pay | Admitting: Cardiology

## 2022-08-10 ENCOUNTER — Encounter: Payer: Self-pay | Admitting: Cardiology

## 2022-08-10 MED ORDER — FLECAINIDE ACETATE 100 MG PO TABS: 100.0000 mg | ORAL_TABLET | Freq: Two times a day (BID) | ORAL | 0 refills | Status: DC

## 2022-08-10 NOTE — Telephone Encounter (Signed)
Spoke to pt about her concerns/disappointment in not hearing back from [place] yet. After explaining process, pt understands and not upset.  She does understand to call office if she needs a same/next day response.  She is agreeable to plan.  Informed of Dr. Curt Bears recommendation -- pt would like to return to Flecainide for now and see how it does until she can be ablated next year. PVC ablation date held for 2/28, aware I will be in touch to go over instructions. Advised to call office if PVCs begin to occur more frequently until such time as ablation can be performed. Patient verbalized understanding and agreeable to plan.

## 2022-08-10 NOTE — Telephone Encounter (Signed)
Pt is requesting call back in regards to MyChart that was sent 10/12 and 10/16. She states that she is very unhappy that she hasn't heard anything back yet, and would like to speak to someone asap in regards to her medication management and how she is feeling. Please advise.

## 2022-08-12 ENCOUNTER — Telehealth: Payer: Self-pay

## 2022-08-12 NOTE — Patient Outreach (Signed)
  Care Coordination   Initial Visit Note   08/12/2022 Name: Patricia Alvarado MRN: [id number] DOB: 1946/10/13  Patricia Alvarado is a 76 y.o. year old female who sees [name], [name], NP for primary care. I spoke with  [name] by phone today.  What matters to the patients health and wellness today?  Placed call to patient to offer and explain [facility] care coordination program. Patient reports to me that she is doing well. Declines services.    SDOH assessments and interventions completed:  No     Care Coordination Interventions Activated:  No  Care Coordination Interventions:  No, not indicated   Follow up plan: No further intervention required.   Encounter Outcome:  Pt. Refused   Tomasa Rand, RN, BSN, CEN [facility] [employer] [phone]

## 2022-08-16 ENCOUNTER — Ambulatory Visit
Admission: RE | Admit: 2022-08-16 | Discharge: 2022-08-16 | Disposition: A | Discharge status: Home / Self Care | Payer: PPO | Source: Ambulatory Visit | Attending: General Surgery | Admitting: General Surgery

## 2022-08-16 DIAGNOSIS — Z853 Personal history of malignant neoplasm of breast: Secondary | ICD-10-CM | POA: Diagnosis not present

## 2022-08-16 DIAGNOSIS — N6489 Other specified disorders of breast: Secondary | ICD-10-CM | POA: Diagnosis not present

## 2022-08-16 DIAGNOSIS — C50411 Malignant neoplasm of upper-outer quadrant of right female breast: Secondary | ICD-10-CM

## 2022-08-16 MED ORDER — GADOPICLENOL 0.5 MMOL/ML IV SOLN
7.0000 mL | Freq: Once | INTRAVENOUS | Status: AC | PRN
  Administered 2022-08-16: 7 mL via INTRAVENOUS

## 2022-08-21 DIAGNOSIS — E7801 Familial hypercholesterolemia: Secondary | ICD-10-CM | POA: Diagnosis not present

## 2022-08-23 LAB — LIPID PANEL
Chol/HDL Ratio: 2.1 ratio (ref 0.0–4.4)
Cholesterol, Total: 155 mg/dL (ref 100–199)
HDL: 74 mg/dL (ref >39)
LDL Chol Calc (NIH): 64 mg/dL (ref 0–99)
Triglycerides: 94 mg/dL (ref 0–149)
VLDL Cholesterol Cal: 17 mg/dL (ref 5–40)

## 2022-08-23 LAB — APOLIPOPROTEIN B: Apolipoprotein B: 56 mg/dL (ref <90)

## 2022-08-24 NOTE — Progress Notes (Signed)
PCP:  [name], NP Primary Cardiologist: None Electrophysiologist: Will Meredith Leeds, MD   Patricia Alvarado is a 76 y.o. female seen today for Will Meredith Leeds, MD for routine electrophysiology followup. Since last being seen in our clinic the patient reports doing much better since flecainide was restarted.  She felt awful on mexitil, to the point she didn't feel safe driving. She is able to get around the house and do ADLs, but has symptoms with 10-20 minutes of anything more strenuous and has to rest.   she denies chest pain, PND, orthopnea, nausea, vomiting, dizziness, syncope, edema, weight gain, or early satiety.   Past Medical History:  Diagnosis Date   A-fib [facility])    Arthritis    thumb   Basal cell carcinoma 2001   "forehead, between eyebrows" right arm (2022)   Breast cancer ([place])    Carcinoma of thyroid gland ([place]) 2001   Chronic lower back pain    "worse is across my hips" (05/26/2016)   Depression    Dyslipidemia    Dyspnea    Family history of breast cancer    Family history of kidney cancer    Family history of leukemia    Family history of nonmelanoma skin cancer    Fibrocystic breast    Fibromyalgia    GERD (gastroesophageal reflux disease)    Heart murmur    History of blood transfusion 1992   "after subcutaneous mastectomies"   Hypertension    not on any medications   Hypothyroidism    Malignant melanoma of left ankle ([place]) 2001   Migraine    "visual; 2-3 times/year" (05/26/2016)   Mitral valve prolapse    PONV (postoperative nausea and vomiting)    Ventricular tachycardia (HCC)    Hx of, controlled on sotalol therapy   Past Surgical History:  Procedure Laterality Date   ABDOMINAL HYSTERECTOMY  1998   ANTERIOR CERVICAL DECOMP/DISCECTOMY FUSION  2001   ATRIAL FIBRILLATION ABLATION N/A 09/12/2021   Procedure: ATRIAL FIBRILLATION ABLATION;  Surgeon: Constance Haw, MD;  Location: [place] [facility];  Service: Cardiovascular;  Laterality: N/A;    BACK SURGERY     BASAL CELL CARCINOMA EXCISION  2001   "cut it out & did a flap, on forehead between my eyebrows"   BREAST IMPLANT EXCHANGE Bilateral 2001   [id number]   BREAST IMPLANT EXCHANGE Left 05/05/2021   Procedure: BREAST IMPLANT EXCHANGE;  Surgeon: Wallace Going, DO;  Location: [place];  Service: Plastics;  Laterality: Left;   BREAST IMPLANT REMOVAL Right 02/05/2020   Procedure: REMOVAL RIGHT BREAST IMPLANT AND CAPSULECTOMY;  Surgeon: Jovita Kussmaul, MD;  Location: [place];  Service: General;  Laterality: Right;   BREAST LUMPECTOMY Right 02/05/2020   Procedure: RIGHT BREAST CENTRAL LUMPECTOMY;  Surgeon: Jovita Kussmaul, MD;  Location: [place];  Service: General;  Laterality: Right;   BREAST RECONSTRUCTION WITH PLACEMENT OF TISSUE EXPANDER AND FLEX HD (ACELLULAR HYDRATED DERMIS) Right 01/20/2021   Procedure: BREAST RECONSTRUCTION WITH PLACEMENT OF TISSUE EXPANDER AND FLEX HD (ACELLULAR HYDRATED DERMIS);  Surgeon: Wallace Going, DO;  Location: [place];  Service: Plastics;  Laterality: Right;  2 hours   CARPAL TUNNEL RELEASE Right 2006   COLONOSCOPY     DILATION AND CURETTAGE OF UTERUS  1970s X 2-3   ELECTROPHYSIOLOGIC STUDY  1994 X 2;2001   "to see it it was sustained VT; cause thyroid levels were causing arrhythmias"   EYE SURGERY  12/2020   cataracts   LAPAROSCOPIC CHOLECYSTECTOMY     MASTECTOMY Bilateral 1992   "subcutaneous"   MELANOMA EXCISION Left 2001   "ankle, stage I"   PLACEMENT OF BREAST IMPLANTS Bilateral 1992   [id number]   REMOVAL OF TISSUE EXPANDER AND PLACEMENT OF IMPLANT Right 05/05/2021   Procedure: REMOVAL OF TISSUE EXPANDER AND PLACEMENT OF IMPLANT;  Surgeon: Wallace Going, DO;  Location: [place];  Service: Plastics;  Laterality: Right;   TOTAL THYROIDECTOMY  12/1999   "cancer"   VENTRICULAR ABLATION SURGERY  2011   ventricular tchycardia    Current Outpatient Medications  Medication Sig Dispense Refill    acetaminophen (TYLENOL) 500 MG tablet Take 500-1,000 mg by mouth every 6 (six) hours as needed for mild pain or moderate pain.     ALPRAZolam (XANAX) 0.5 MG tablet Take 0.25 mg by mouth at bedtime.     apixaban (ELIQUIS) 5 MG TABS tablet Take 1 tablet (5 mg total) by mouth 2 (two) times daily. 180 tablet 1   Ascorbic Acid (VITAMIN C) 1000 MG tablet Take 1,000 mg by mouth daily.     B Complex-C (SUPER B COMPLEX PO) Take 1 capsule by mouth at bedtime.     beta carotene 10000 UNIT capsule Take 10,000 Units by mouth daily.     Biotin 5 MG CAPS Take 5 mg by mouth at bedtime.      Calcium 600-200 MG-UNIT tablet Take 1 tablet by mouth daily.     cetirizine (ZYRTEC) 10 MG tablet Take 10 mg by mouth at bedtime.     Cholecalciferol (VITAMIN D) 125 MCG (5000 UT) CAPS Take 10,000 Units by mouth daily.     diltiazem (DILT-XR) 120 MG 24 hr capsule Take 1 capsule (120 mg total) by mouth daily. 90 capsule 2   diphenhydrAMINE (BENADRYL) 25 MG tablet Take 25 mg by mouth every 6 (six) hours as needed for allergies.     docusate sodium (COLACE) 100 MG capsule Take 200 mg by mouth as needed.     escitalopram (LEXAPRO) 20 MG tablet Take 20 mg by mouth at bedtime.     Evolocumab (REPATHA SURECLICK) 798 MG/ML SOAJ Inject 1 pen  into the skin every 14 (fourteen) days.     ezetimibe (ZETIA) 10 MG tablet Take 10 mg by mouth daily.     famotidine (PEPCID) 20 MG tablet Take 20 mg by mouth as needed for heartburn or indigestion.     flecainide (TAMBOCOR) 100 MG tablet Take 1 tablet (100 mg total) by mouth 2 (two) times daily. May take an extra 100 mg once weekly if needed 200 tablet 1   fluticasone (FLONASE) 50 MCG/ACT nasal spray Place 1 spray into both nostrils daily as needed for allergies.     furosemide (LASIX) 20 MG tablet Take 20 mg by mouth daily as needed for fluid (1-2lb weight gain overnight).     magnesium hydroxide (MILK OF MAGNESIA) 400 MG/5ML suspension Take 15 mLs by mouth daily as needed for mild  constipation.     MANGANESE PO Take 40 mg by mouth at bedtime.     Melatonin 10 MG TABS Take 10 mg by mouth at bedtime.      methocarbamol (ROBAXIN) 500 MG tablet Take 1 tablet by mouth daily as needed.     Niacinamide-Zn-Cu-Methfo-Se-Cr (NICOTINAMIDE PO) Take 500 mg by mouth 2 (two) times daily.     omeprazole (PRILOSEC) 20 MG capsule Take 20 mg by mouth daily as needed (heartburn).  OVER THE COUNTER MEDICATION Take 1 capsule by mouth daily. Co Q10 100 mg with K2     Polyvinyl Alcohol (LIQUID TEARS OP) Place 1 drop into both eyes daily as needed (dry eyes).     ranolazine (RANEXA) 500 MG 12 hr tablet Take 1 tablet (500 mg total) by mouth 2 (two) times daily. 180 tablet 2   Simethicone 125 MG CAPS Take 250 mg by mouth daily as needed (bloating).     Thiamine HCl (VITAMIN B1) 100 MG TABS Take 100 mg by mouth at bedtime.     thyroid (ARMOUR) 120 MG tablet Take 120 mg by mouth daily before breakfast.     Zinc 22.5 MG TABS Take 22.5 mg by mouth daily.     No current facility-administered medications for this visit.    Allergies  Allergen Reactions   Quinidine Palpitations    REACTION: increased heart rate   Amoxicillin Itching and Rash   Atenolol Hives   Atorvastatin Other (See Comments)    REACTION: muscles pain   Dofetilide Other (See Comments)    Elevated QTCs   Latex Rash    Raw, rash and blisters - Tape only   Mexiletine Hcl Palpitations    Dizzy and extreme tiredness   Nadolol Hives   Vicodin  [Hydrocodone-Acetaminophen] Swelling    Facial redness and swelling    Social History   Socioeconomic History   Marital status: Married    Spouse name: Tour manager (Spouse)   Number of children: Not on file   Years of education: Not on file   Highest education level: Not on file  Occupational History   Occupation: Financial controller [employer]    Employer:   Tobacco Use   Smoking status: Never    Passive exposure: Never   Smokeless tobacco: Never  Vaping Use   Vaping Use:  Never used  Substance and Sexual Activity   Alcohol use: No   Drug use: No   Sexual activity: Yes  Other Topics Concern   Not on file  Social History Narrative   Married   Social Determinants of Health   Financial Resource Strain: Not on file  Food Insecurity: Not on file  Transportation Needs: Not on file  Physical Activity: Not on file  Stress: Not on file  Social Connections: Not on file  Intimate Partner Violence: Not on file     Review of Systems: All other systems reviewed and are otherwise negative except as noted above.  Physical Exam: Vitals:   08/31/22 1047  BP: 128/68  Pulse: 90  SpO2: 97%  Weight: 149 lb 4 oz (67.7 kg)  Height: 5' 4.5" (1.638 m)    GEN- The patient is well appearing, alert and oriented x 3 today.   HEENT: normocephalic, atraumatic; sclera clear, conjunctiva pink; hearing intact; oropharynx clear; neck supple, no JVP Lymph- no cervical lymphadenopathy Lungs- Clear to ausculation bilaterally, normal work of breathing.  No wheezes, rales, rhonchi Heart-  Somewhat irregular  rate and rhythm due to ectopy, no murmurs, rubs or gallops, PMI not laterally displaced GI- soft, non-tender, non-distended, bowel sounds present, no hepatosplenomegaly Extremities- No peripheral edema. no clubbing or cyanosis; DP/PT/radial pulses 2+ bilaterally MS- no significant deformity or atrophy Skin- warm and dry, no rash or lesion Psych- euthymic mood, full affect Neuro- strength and sensation are intact  EKG is ordered. Personal review of EKG from today shows NSR at 90 bpm with at least 2 separate PVCs, if not 3.   Additional studies reviewed  include: Previous EP office notes.   Assessment and Plan:  1.  Paroxysmal atrial fibrillation:  S/p ablation 08/2021 EKG today shows NSR with PVCs Continue Eliquis 5 mg twice daily for CHA2DS2-VASc of 2.  High risk medication monitoring for flecainide.    2.  PVCs: Continue mexiletine 50 mg BID.    3. HTN Stable  on current regimen.   Follow up with Dr. Curt Bears in  January as scheduled to discuss ablation.    Shirley Friar, PA-C  08/31/22 10:59 AM

## 2022-08-25 ENCOUNTER — Encounter: Payer: Self-pay | Admitting: Pharmacist

## 2022-08-31 ENCOUNTER — Encounter: Payer: Self-pay | Admitting: Student

## 2022-08-31 ENCOUNTER — Ambulatory Visit: Payer: PPO | Attending: Student | Admitting: Student

## 2022-08-31 VITALS — BP 128/68 | HR 90 | Ht 64.5 in | Wt 149.2 lb

## 2022-08-31 DIAGNOSIS — I493 Ventricular premature depolarization: Secondary | ICD-10-CM | POA: Diagnosis not present

## 2022-08-31 DIAGNOSIS — I480 Paroxysmal atrial fibrillation: Secondary | ICD-10-CM | POA: Diagnosis not present

## 2022-08-31 NOTE — Patient Instructions (Signed)
Medication Instructions:  Your physician recommends that you continue on your current medications as directed. Please refer to the Current Medication list given to you today.  *If you need a refill on your cardiac medications before your next appointment, please call your pharmacy*   Lab Work: None  If you have labs (blood work) drawn today and your tests are completely normal, you will receive your results only by: MyChart Message (if you have MyChart) OR A paper copy in the mail If you have any lab test that is abnormal or we need to change your treatment, we will call you to review the results.   Follow-Up: At [facility], you and your health needs are our priority.  As part of our continuing mission to provide you with exceptional heart care, we have created designated Provider Care Teams.  These Care Teams include your primary Cardiologist (physician) and Advanced Practice Providers (APPs -  Physician Assistants and Nurse Practitioners) who all work together to provide you with the care you need, when you need it.   Your next appointment:   As scheduled  Important Information About Sugar       

## 2022-09-09 ENCOUNTER — Other Ambulatory Visit (HOSPITAL_COMMUNITY): Payer: Self-pay

## 2022-09-09 ENCOUNTER — Encounter: Payer: Self-pay | Admitting: Pharmacist

## 2022-09-11 ENCOUNTER — Other Ambulatory Visit (HOSPITAL_COMMUNITY): Payer: Self-pay

## 2022-09-21 ENCOUNTER — Other Ambulatory Visit (HOSPITAL_COMMUNITY): Payer: Self-pay

## 2022-09-23 ENCOUNTER — Other Ambulatory Visit (HOSPITAL_COMMUNITY): Payer: Self-pay

## 2022-10-14 ENCOUNTER — Other Ambulatory Visit (HOSPITAL_COMMUNITY): Payer: Self-pay

## 2022-10-20 ENCOUNTER — Encounter: Payer: Self-pay | Admitting: Pharmacist

## 2022-10-21 ENCOUNTER — Other Ambulatory Visit: Payer: Self-pay | Admitting: Pharmacist

## 2022-10-21 MED ORDER — REPATHA SURECLICK 140 MG/ML ~~LOC~~ SOAJ: 1.0000 | SUBCUTANEOUS | 0 refills | Status: DC

## 2022-10-21 NOTE — Progress Notes (Signed)
Received call from [facility], their pharmacy didn't fill pt's rx because they filled last rx under "Patricia Alvarado" and this one just states "Patricia Alvarado." Clearly the same pt with same last name and DOB, apparently their pharmacy is "picky." E-scribed rx to their pharmacy which is [facility] so that pt can get 1 last fill before she changes to using local pharmacy and [place] in 2024.

## 2022-10-27 ENCOUNTER — Other Ambulatory Visit: Payer: Self-pay | Admitting: Pharmacist

## 2022-10-27 ENCOUNTER — Encounter: Payer: Self-pay | Admitting: Pharmacist

## 2022-10-27 ENCOUNTER — Other Ambulatory Visit: Payer: Self-pay

## 2022-10-27 ENCOUNTER — Other Ambulatory Visit (HOSPITAL_COMMUNITY): Payer: Self-pay

## 2022-10-27 MED ORDER — REPATHA SURECLICK 140 MG/ML ~~LOC~~ SOAJ
1.0000 | SUBCUTANEOUS | 3 refills | Status: DC
  Filled 2022-10-27: qty 6, 84d supply, fill #0
  Filled 2023-01-18: qty 6, 84d supply, fill #1
  Filled 2023-04-07: qty 6, 84d supply, fill #2
  Filled 2023-06-21 – 2023-07-02 (×2): qty 6, 84d supply, fill #3

## 2022-10-28 ENCOUNTER — Other Ambulatory Visit (HOSPITAL_COMMUNITY): Payer: Self-pay

## 2022-10-28 ENCOUNTER — Other Ambulatory Visit: Payer: Self-pay

## 2022-10-29 ENCOUNTER — Ambulatory Visit: Payer: PPO | Admitting: Cardiology

## 2022-11-02 ENCOUNTER — Encounter: Payer: Self-pay | Admitting: Cardiology

## 2022-11-02 ENCOUNTER — Encounter: Payer: Self-pay | Admitting: *Deleted

## 2022-11-02 ENCOUNTER — Other Ambulatory Visit (HOSPITAL_COMMUNITY): Payer: Self-pay

## 2022-11-02 ENCOUNTER — Ambulatory Visit: Payer: PPO | Attending: Cardiology | Admitting: Cardiology

## 2022-11-02 VITALS — BP 142/62 | HR 83 | Ht 64.5 in | Wt 150.4 lb

## 2022-11-02 DIAGNOSIS — D6869 Other thrombophilia: Secondary | ICD-10-CM | POA: Diagnosis not present

## 2022-11-02 DIAGNOSIS — Z01812 Encounter for preprocedural laboratory examination: Secondary | ICD-10-CM

## 2022-11-02 DIAGNOSIS — I4819 Other persistent atrial fibrillation: Secondary | ICD-10-CM | POA: Diagnosis not present

## 2022-11-02 DIAGNOSIS — Z79899 Other long term (current) drug therapy: Secondary | ICD-10-CM | POA: Diagnosis not present

## 2022-11-02 DIAGNOSIS — I493 Ventricular premature depolarization: Secondary | ICD-10-CM

## 2022-11-02 NOTE — Patient Instructions (Signed)
Medication Instructions:  Your physician recommends that you continue on your current medications as directed. Please refer to the Current Medication list given to you today.  *If you need a refill on your cardiac medications before your next appointment, please call your pharmacy*   Lab Work: None ordered   Testing/Procedures: Your physician has recommended that you have an ablation. Catheter ablation is a medical procedure used to treat some cardiac arrhythmias (irregular heartbeats). During catheter ablation, a long, thin, flexible tube is put into a blood vessel in your groin (upper thigh), or neck. This tube is called an ablation catheter. It is then guided to your heart through the blood vessel. Radio frequency waves destroy small areas of heart tissue where abnormal heartbeats may cause an arrhythmia to start. Please see the instruction sheet given to you today.   Follow-Up: At [facility], you and your health needs are our priority.  As part of our continuing mission to provide you with exceptional heart care, we have created designated Provider Care Teams.  These Care Teams include your primary Cardiologist (physician) and Advanced Practice Providers (APPs -  Physician Assistants and Nurse Practitioners) who all work together to provide you with the care you need, when you need it.   Your next appointment:   1 month(s) after your ablation  The format for your next appointment:   In Person  Provider:   Allegra Lai, MD    Thank you for choosing [facility]!!   Trinidad Curet, RN [phone]  Other Instructions   Important Information About Sugar

## 2022-11-02 NOTE — Progress Notes (Signed)
Electrophysiology Office Note   Date:  11/02/2022   ID:  Patricia, Alvarado 1946/04/25, MRN [id number]  PCP:  [name], NP  Cardiologist:   Primary Electrophysiologist: Gaye Alken, MD    Chief Complaint: AF   History of Present Illness: Patricia Alvarado is a 77 y.o. female who is being seen today for the evaluation of AF at the request of [name], [name], NP. Presenting today for electrophysiology evaluation.  She has a history significant for hypertension, PVCs, nonsustained VT, atrial fibrillation.  She is post atrial fibrillation ablation 09/12/2021.  She wore a cardiac monitor that showed a 12.6% PVC burden.  Today, denies symptoms of palpitations, chest pain, shortness of breath, orthopnea, PND, lower extremity edema, claudication, dizziness, presyncope, syncope, bleeding, or neurologic sequela. The patient is tolerating medications without difficulties.  She continues to have episodic palpitations.  Overall she is doing well.  Her palpitations are much better controlled on flecainide and they were on mexiletine.  She did feel quite poorly when she was taking mexiletine.  She is ready for ablation.  Ablation scheduled 12/23/2022.   Past Medical History:  Diagnosis Date   A-fib [facility])    Arthritis    thumb   Basal cell carcinoma 2001   "forehead, between eyebrows" right arm (2022)   Breast cancer ([place])    Carcinoma of thyroid gland ([place]) 2001   Chronic lower back pain    "worse is across my hips" (05/26/2016)   Depression    Dyslipidemia    Dyspnea    Family history of breast cancer    Family history of kidney cancer    Family history of leukemia    Family history of nonmelanoma skin cancer    Fibrocystic breast    Fibromyalgia    GERD (gastroesophageal reflux disease)    Heart murmur    History of blood transfusion 1992   "after subcutaneous mastectomies"   Hypertension    not on any medications   Hypothyroidism    Malignant melanoma of left ankle  ([facility]) 2001   Migraine    "visual; 2-3 times/year" (05/26/2016)   Mitral valve prolapse    PONV (postoperative nausea and vomiting)    Ventricular tachycardia (HCC)    Hx of, controlled on sotalol therapy   Past Surgical History:  Procedure Laterality Date   ABDOMINAL HYSTERECTOMY  1998   ANTERIOR CERVICAL DECOMP/DISCECTOMY FUSION  2001   ATRIAL FIBRILLATION ABLATION N/A 09/12/2021   Procedure: ATRIAL FIBRILLATION ABLATION;  Surgeon: Constance Haw, MD;  Location: [facility];  Service: Cardiovascular;  Laterality: N/A;   BACK SURGERY     BASAL CELL CARCINOMA EXCISION  2001   "cut it out & did a flap, on forehead between my eyebrows"   BREAST IMPLANT EXCHANGE Bilateral 2001   [id number]   BREAST IMPLANT EXCHANGE Left 05/05/2021   Procedure: BREAST IMPLANT EXCHANGE;  Surgeon: Wallace Going, DO;  Location: [facility];  Service: Plastics;  Laterality: Left;   BREAST IMPLANT REMOVAL Right 02/05/2020   Procedure: REMOVAL RIGHT BREAST IMPLANT AND CAPSULECTOMY;  Surgeon: Jovita Kussmaul, MD;  Location: [place];  Service: General;  Laterality: Right;   BREAST LUMPECTOMY Right 02/05/2020   Procedure: RIGHT BREAST CENTRAL LUMPECTOMY;  Surgeon: Jovita Kussmaul, MD;  Location: [facility];  Service: General;  Laterality: Right;   BREAST RECONSTRUCTION WITH PLACEMENT OF TISSUE EXPANDER AND FLEX HD (ACELLULAR HYDRATED DERMIS) Right 01/20/2021  Procedure: BREAST RECONSTRUCTION WITH PLACEMENT OF TISSUE EXPANDER AND FLEX HD (ACELLULAR HYDRATED DERMIS);  Surgeon: Wallace Going, DO;  Location: [facility];  Service: Plastics;  Laterality: Right;  2 hours   CARPAL TUNNEL RELEASE Right 2006   COLONOSCOPY     DILATION AND CURETTAGE OF UTERUS  1970s X 2-3   ELECTROPHYSIOLOGIC STUDY  1994 X 2;2001   "to see it it was sustained VT; cause thyroid levels were causing arrhythmias"   EYE SURGERY  12/2020   cataracts   LAPAROSCOPIC CHOLECYSTECTOMY     MASTECTOMY  Bilateral 1992   "subcutaneous"   MELANOMA EXCISION Left 2001   "ankle, stage I"   PLACEMENT OF BREAST IMPLANTS Bilateral 1992   [id number]   REMOVAL OF TISSUE EXPANDER AND PLACEMENT OF IMPLANT Right 05/05/2021   Procedure: REMOVAL OF TISSUE EXPANDER AND PLACEMENT OF IMPLANT;  Surgeon: Wallace Going, DO;  Location: [place];  Service: Plastics;  Laterality: Right;   TOTAL THYROIDECTOMY  12/1999   "cancer"   VENTRICULAR ABLATION SURGERY  2011   ventricular tchycardia     Current Outpatient Medications  Medication Sig Dispense Refill   acetaminophen (TYLENOL) 500 MG tablet Take 500-1,000 mg by mouth every 6 (six) hours as needed for mild pain or moderate pain.     ALPRAZolam (XANAX) 0.5 MG tablet Take 0.25 mg by mouth at bedtime.     apixaban (ELIQUIS) 5 MG TABS tablet Take 1 tablet (5 mg total) by mouth 2 (two) times daily. 180 tablet 0   Ascorbic Acid (VITAMIN C) 1000 MG tablet Take 1,000 mg by mouth daily.     B Complex-C (SUPER B COMPLEX PO) Take 1 capsule by mouth at bedtime.     beta carotene 10000 UNIT capsule Take 10,000 Units by mouth daily.     Biotin 5 MG CAPS Take 5 mg by mouth at bedtime.      Calcium 600-200 MG-UNIT tablet Take 1 tablet by mouth daily.     cetirizine (ZYRTEC) 10 MG tablet Take 10 mg by mouth at bedtime.     Cholecalciferol (VITAMIN D) 125 MCG (5000 UT) CAPS Take 10,000 Units by mouth daily.     diltiazem (DILT-XR) 120 MG 24 hr capsule Take 1 capsule (120 mg total) by mouth daily. 90 capsule 0   diphenhydrAMINE (BENADRYL) 25 MG tablet Take 25 mg by mouth every 6 (six) hours as needed for allergies.     docusate sodium (COLACE) 100 MG capsule Take 200 mg by mouth as needed.     escitalopram (LEXAPRO) 20 MG tablet Take 20 mg by mouth at bedtime.     Evolocumab (REPATHA SURECLICK) 063 MG/ML SOAJ Inject 140 mg into the skin every 14 (fourteen) days. 6 mL 3   ezetimibe (ZETIA) 10 MG tablet Take 10 mg by mouth daily.     famotidine (PEPCID) 20 MG tablet Take  20 mg by mouth as needed for heartburn or indigestion.     flecainide (TAMBOCOR) 100 MG tablet Take 1 tablet (100 mg total) by mouth 2 (two) times daily. May take an extra 100 mg once weekly if needed 200 tablet 0   fluticasone (FLONASE) 50 MCG/ACT nasal spray Place 1 spray into both nostrils daily as needed for allergies.     furosemide (LASIX) 20 MG tablet Take 20 mg by mouth daily as needed for fluid (1-2lb weight gain overnight).     magnesium hydroxide (MILK OF MAGNESIA) 400 MG/5ML suspension Take 15 mLs by mouth daily as  needed for mild constipation.     MANGANESE PO Take 40 mg by mouth at bedtime.     Melatonin 10 MG TABS Take 10 mg by mouth at bedtime.      methocarbamol (ROBAXIN) 500 MG tablet Take 1 tablet by mouth daily as needed.     omeprazole (PRILOSEC) 20 MG capsule Take 20 mg by mouth daily as needed (heartburn).     OVER THE COUNTER MEDICATION Take 1 capsule by mouth daily. Co Q10 100 mg with K2     Polyvinyl Alcohol (LIQUID TEARS OP) Place 1 drop into both eyes daily as needed (dry eyes).     ranolazine (RANEXA) 500 MG 12 hr tablet Take 1 tablet (500 mg total) by mouth 2 (two) times daily. 180 tablet 0   Simethicone 125 MG CAPS Take 250 mg by mouth daily as needed (bloating).     Thiamine HCl (VITAMIN B1) 100 MG TABS Take 100 mg by mouth at bedtime.     thyroid (ARMOUR) 120 MG tablet Take 120 mg by mouth daily before breakfast.     Zinc 22.5 MG TABS Take 22.5 mg by mouth daily.     No current facility-administered medications for this visit.    Allergies:   Quinidine, Amoxicillin, Atenolol, Atorvastatin, Dofetilide, Latex, Mexiletine hcl, Nadolol, and Vicodin  [hydrocodone-acetaminophen]   Social History:  The patient  reports that she has never smoked. She has never been exposed to tobacco smoke. She has never used smokeless tobacco. She reports that she does not drink alcohol and does not use drugs.   Family History:  The patient's family history includes Arrhythmia in her  brother; Basal cell carcinoma (age of onset: 81) in her mother; Breast cancer in an other family member; Breast cancer (age of onset: 41) in her maternal grandmother; Cancer in her maternal aunt; Heart attack in her brother; Heart disease in her brother; Heart disease (age of onset: 19) in her father; Hyperlipidemia (age of onset: 39) in her mother; Hypertension in her brother; Leukemia in her maternal aunt; Lung cancer (age of onset: 43) in her maternal aunt; Osteoporosis in her mother; Other in her brother, father, and paternal aunt; Squamous cell carcinoma (age of onset: 26) in her brother.   ROS:  Please see the history of present illness.   Otherwise, review of systems is positive for none.   All other systems are reviewed and negative.   PHYSICAL EXAM: VS:  BP (!) 142/62   Pulse 83   Ht 5' 4.5" (1.638 m)   Wt 150 lb 6.4 oz (68.2 kg)   SpO2 96%   BMI 25.42 kg/m  , BMI Body mass index is 25.42 kg/m. GEN: Well nourished, well developed, in no acute distress  HEENT: normal  Neck: no JVD, carotid bruits, or masses Cardiac: RRR; no murmurs, rubs, or gallops,no edema  Respiratory:  clear to auscultation bilaterally, normal work of breathing GI: soft, nontender, nondistended, + BS MS: no deformity or atrophy  Skin: warm and dry Neuro:  Strength and sensation are intact Psych: euthymic mood, full affect  EKG:  EKG is not ordered today. Personal review of the ekg ordered 08/31/22 shows sinus rhythm, PVCs  Recent Labs: No results found for requested labs within last 365 days.    Lipid Panel     Component Value Date/Time   CHOL 155 08/21/2022 0817   TRIG 94 08/21/2022 0817   HDL 74 08/21/2022 0817   CHOLHDL 2.1 08/21/2022 0817   LDLCALC 64  08/21/2022 0817     Wt Readings from Last 3 Encounters:  11/02/22 150 lb 6.4 oz (68.2 kg)  08/31/22 149 lb 4 oz (67.7 kg)  07/29/22 148 lb 6.4 oz (67.3 kg)      Other studies Reviewed: Additional studies/ records that were reviewed today  include: TTE 9/14 /21 Review of the above records today demonstrates:  1. Left ventricular ejection fraction, by estimation, is 55 to 60%. The  left ventricle has normal function. The left ventricle has no regional  wall motion abnormalities. Left ventricular diastolic parameters are  consistent with Grade I diastolic  dysfunction (impaired relaxation).   2. Right ventricular systolic function is normal. The right ventricular  size is normal. There is normal pulmonary artery systolic pressure. The  estimated right ventricular systolic pressure is 53.6 mmHg.   3. P2 prolpase with late systolic MR that is mild. There is mitral  annular disjunction noted which is associated with PVCs. 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 Mild mitral valve regurgitation.   4. The aortic valve is tricuspid. Aortic valve regurgitation is mild. No  aortic stenosis is present.   5. The inferior vena cava is normal in size with greater than 50%  respiratory variability, suggesting right atrial pressure of 3 mmHg.   6. Evidence of atrial level shunting detected by color flow Doppler.  There is a small patent foramen ovale with predominantly left to right  shunting across the atrial septum.  Cardiac monitor 07/23/2022 personally reviewed Predominant rhythm was sinus rhythm 12.6% supraventricular ectopy Less than 1% ventricular ectopy Triggered episodes associated with PVCs  ASSESSMENT AND PLAN:  1.  Paroxysmal atrial fibrillation: Currently on flecainide 100 mg twice daily, diltiazem 120 mg daily, Eliquis 5 mg twice daily.  CHA2DS2-VASc of 2.  Status post ablation 09/12/2021.  Has remained in sinus rhythm.  Alexi Dorminey continue with current management.  2.  PVCs: Currently on flecainide as above.  Burden of 12.6%.  She continues to be symptomatic with shortness of breath and palpitations.  She has plans for ablation 12/23/2022.  She continues to have palpitations and shortness of breath.  She feels much better on the  flecainide than she did on mexiletine.  She has plans for ablation 12/23/2022.  Risk and benefits have been discussed.  She understands the risks and is agreed to the procedure.  3.  Hypertension: Mildly elevated but usually well-controlled.  4.  Secondary hypercoagulable state: Currently on Eliquis for atrial fibrillation as above  5.  High risk medication monitoring: Currently on flecainide for atrial fibrillation and PVCs as above.  QRS remains narrow.  Current medicines are reviewed at length with the patient today.   The patient does not have concerns regarding her medicines.  The following changes were made today: None  Labs/ tests ordered today include:  Orders Placed This Encounter  Procedures   Basic metabolic panel   CBC     Disposition:   FU 3 months  Signed, Hal Norrington Meredith Leeds, MD  11/02/2022 10:58 AM     [address] [place] [address] [phone] (office) [phone] (fax)

## 2022-11-23 ENCOUNTER — Encounter: Payer: Self-pay | Admitting: Cardiology

## 2022-11-23 NOTE — Telephone Encounter (Signed)
Error

## 2022-12-03 ENCOUNTER — Encounter (HOSPITAL_COMMUNITY): Payer: Self-pay | Admitting: *Deleted

## 2022-12-07 DIAGNOSIS — Z139 Encounter for screening, unspecified: Secondary | ICD-10-CM | POA: Diagnosis not present

## 2022-12-07 DIAGNOSIS — I493 Ventricular premature depolarization: Secondary | ICD-10-CM | POA: Diagnosis not present

## 2022-12-07 DIAGNOSIS — R7309 Other abnormal glucose: Secondary | ICD-10-CM | POA: Diagnosis not present

## 2022-12-07 DIAGNOSIS — I10 Essential (primary) hypertension: Secondary | ICD-10-CM | POA: Diagnosis not present

## 2022-12-07 DIAGNOSIS — E039 Hypothyroidism, unspecified: Secondary | ICD-10-CM | POA: Diagnosis not present

## 2022-12-07 DIAGNOSIS — F330 Major depressive disorder, recurrent, mild: Secondary | ICD-10-CM | POA: Diagnosis not present

## 2022-12-07 DIAGNOSIS — I700 Atherosclerosis of aorta: Secondary | ICD-10-CM | POA: Diagnosis not present

## 2022-12-07 DIAGNOSIS — E785 Hyperlipidemia, unspecified: Secondary | ICD-10-CM | POA: Diagnosis not present

## 2022-12-10 DIAGNOSIS — I4819 Other persistent atrial fibrillation: Secondary | ICD-10-CM | POA: Diagnosis not present

## 2022-12-10 DIAGNOSIS — Z79899 Other long term (current) drug therapy: Secondary | ICD-10-CM | POA: Diagnosis not present

## 2022-12-10 DIAGNOSIS — Z01812 Encounter for preprocedural laboratory examination: Secondary | ICD-10-CM | POA: Diagnosis not present

## 2022-12-11 LAB — CBC
Hematocrit: 42.1 % (ref 34.0–46.6)
Hemoglobin: 14.2 g/dL (ref 11.1–15.9)
MCH: 31.6 pg (ref 26.6–33.0)
MCHC: 33.7 g/dL (ref 31.5–35.7)
MCV: 94 fL (ref 79–97)
Platelets: 205 10*3/uL (ref 150–450)
RBC: 4.49 x10E6/uL (ref 3.77–5.28)
RDW: 12.2 % (ref 11.7–15.4)
WBC: 4.2 10*3/uL (ref 3.4–10.8)

## 2022-12-11 LAB — BASIC METABOLIC PANEL
BUN/Creatinine Ratio: 20 (ref 12–28)
BUN: 14 mg/dL (ref 8–27)
CO2: 25 mmol/L (ref 20–29)
Calcium: 9.4 mg/dL (ref 8.7–10.3)
Chloride: 103 mmol/L (ref 96–106)
Creatinine, Ser: 0.7 mg/dL (ref 0.57–1.00)
Glucose: 106 mg/dL — ABNORMAL HIGH (ref 70–99)
Potassium: 3.8 mmol/L (ref 3.5–5.2)
Sodium: 142 mmol/L (ref 134–144)
eGFR: 90 mL/min/{1.73_m2} (ref >59)

## 2022-12-17 ENCOUNTER — Other Ambulatory Visit: Payer: Self-pay

## 2022-12-17 ENCOUNTER — Other Ambulatory Visit (HOSPITAL_COMMUNITY): Payer: Self-pay

## 2022-12-22 NOTE — Pre-Procedure Instructions (Signed)
Instructed patient on the following items: Arrival time 0600 Nothing to eat or drink after midnight No meds AM of procedure Responsible person to drive you home and stay with you for 24 hrs  Have you missed any doses of anti-coagulant Eliquis- last dose 2/24

## 2022-12-22 NOTE — Anesthesia Preprocedure Evaluation (Signed)
Anesthesia Evaluation  Patient identified by MRN, date of birth, ID band Patient awake    Reviewed: Allergy & Precautions, NPO status , Patient's Chart, lab work & pertinent test results  History of Anesthesia Complications (+) PONV and history of anesthetic complications  Airway Mallampati: II  TM Distance: >3 FB Neck ROM: Full    Dental no notable dental hx. (+) Dental Advisory Given   Pulmonary neg pulmonary ROS   Pulmonary exam normal        Cardiovascular hypertension, Pt. on medications Normal cardiovascular exam+ dysrhythmias Atrial Fibrillation and Ventricular Tachycardia + Valvular Problems/Murmurs MVP   TTE 2022 IMPRESSIONS     1. Left ventricular ejection fraction, by estimation, is 55 to 60%. The  left ventricle has normal function. The left ventricle has no regional  wall motion abnormalities. Left ventricular diastolic parameters are  consistent with Grade I diastolic  dysfunction (impaired relaxation).   2. Right ventricular systolic function is normal. The right ventricular  size is normal. There is normal pulmonary artery systolic pressure. The  estimated right ventricular systolic pressure is [id number] mmHg.   3. P2 prolpase with late systolic MR that is mild. There is mitral  annular disjunction noted which is associated with PVCs. 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 Mild mitral valve regurgitation.   4. The aortic valve is tricuspid. Aortic valve regurgitation is mild. No  aortic stenosis is present.   5. The inferior vena cava is normal in size with greater than 50%  respiratory variability, suggesting right atrial pressure of 3 mmHg.   6. Evidence of atrial level shunting detected by color flow Doppler.  There is a small patent foramen ovale with predominantly left to right  shunting across the atrial septum.   Comparison(s): No significant change from prior study.     VT (VT ablation 01/21/10; PVCs managed  on flecainide), PAF (on flecainide and diltiazem)   Neuro/Psych  Headaches PSYCHIATRIC DISORDERS  Depression    C6-7 ACDF  Neuromuscular disease    GI/Hepatic Neg liver ROS,GERD  Medicated,,  Endo/Other  Hypothyroidism    Renal/GU negative Renal ROS     Musculoskeletal  (+) Arthritis ,  Fibromyalgia -  Abdominal   Peds  Hematology negative hematology ROS (+)   Anesthesia Other Findings   Reproductive/Obstetrics                             Anesthesia Physical Anesthesia Plan  ASA: 3  Anesthesia Plan: General   Post-op Pain Management: Minimal or no pain anticipated and Tylenol PO (pre-op)*   Induction: Intravenous  PONV Risk Score and Plan: 4 or greater and Propofol infusion, Dexamethasone, Ondansetron and Treatment may vary due to age or medical condition  Airway Management Planned: Oral ETT  Additional Equipment: None  Intra-op Plan:   Post-operative Plan: Extubation in OR  Informed Consent: I have reviewed the patients History and Physical, chart, labs and discussed the procedure including the risks, benefits and alternatives for the proposed anesthesia with the patient or authorized representative who has indicated his/her understanding and acceptance.     Dental advisory given  Plan Discussed with: Anesthesiologist and CRNA  Anesthesia Plan Comments:         Anesthesia Quick Evaluation

## 2022-12-23 ENCOUNTER — Ambulatory Visit (HOSPITAL_COMMUNITY): Payer: PPO | Admitting: Anesthesiology

## 2022-12-23 ENCOUNTER — Ambulatory Visit (HOSPITAL_BASED_OUTPATIENT_CLINIC_OR_DEPARTMENT_OTHER): Payer: PPO | Admitting: Anesthesiology

## 2022-12-23 ENCOUNTER — Ambulatory Visit (HOSPITAL_COMMUNITY)
Admission: RE | Disposition: A | Discharge status: Home / Self Care | Payer: Self-pay | Source: Home / Self Care | Attending: Cardiology

## 2022-12-23 ENCOUNTER — Inpatient Hospital Stay (HOSPITAL_COMMUNITY)
Admission: RE | Admit: 2022-12-23 | Discharge: 2022-12-29 | DRG: 277 | Disposition: A | Discharge status: Home / Self Care | Payer: PPO | Attending: Internal Medicine | Admitting: Internal Medicine

## 2022-12-23 ENCOUNTER — Other Ambulatory Visit: Payer: Self-pay

## 2022-12-23 ENCOUNTER — Other Ambulatory Visit (HOSPITAL_COMMUNITY): Payer: Self-pay

## 2022-12-23 DIAGNOSIS — I340 Nonrheumatic mitral (valve) insufficiency: Secondary | ICD-10-CM | POA: Diagnosis not present

## 2022-12-23 DIAGNOSIS — Z950 Presence of cardiac pacemaker: Secondary | ICD-10-CM | POA: Diagnosis not present

## 2022-12-23 DIAGNOSIS — Z888 Allergy status to other drugs, medicaments and biological substances status: Secondary | ICD-10-CM | POA: Diagnosis not present

## 2022-12-23 DIAGNOSIS — I4891 Unspecified atrial fibrillation: Secondary | ICD-10-CM | POA: Diagnosis not present

## 2022-12-23 DIAGNOSIS — Z8262 Family history of osteoporosis: Secondary | ICD-10-CM

## 2022-12-23 DIAGNOSIS — Z85828 Personal history of other malignant neoplasm of skin: Secondary | ICD-10-CM | POA: Diagnosis not present

## 2022-12-23 DIAGNOSIS — Z8249 Family history of ischemic heart disease and other diseases of the circulatory system: Secondary | ICD-10-CM

## 2022-12-23 DIAGNOSIS — I472 Ventricular tachycardia, unspecified: Secondary | ICD-10-CM | POA: Diagnosis not present

## 2022-12-23 DIAGNOSIS — Z880 Allergy status to penicillin: Secondary | ICD-10-CM

## 2022-12-23 DIAGNOSIS — Z803 Family history of malignant neoplasm of breast: Secondary | ICD-10-CM | POA: Diagnosis not present

## 2022-12-23 DIAGNOSIS — I4729 Other ventricular tachycardia: Secondary | ICD-10-CM | POA: Diagnosis present

## 2022-12-23 DIAGNOSIS — Z90710 Acquired absence of both cervix and uterus: Secondary | ICD-10-CM

## 2022-12-23 DIAGNOSIS — Z806 Family history of leukemia: Secondary | ICD-10-CM

## 2022-12-23 DIAGNOSIS — Z801 Family history of malignant neoplasm of trachea, bronchus and lung: Secondary | ICD-10-CM

## 2022-12-23 DIAGNOSIS — F32A Depression, unspecified: Secondary | ICD-10-CM | POA: Diagnosis present

## 2022-12-23 DIAGNOSIS — Z85850 Personal history of malignant neoplasm of thyroid: Secondary | ICD-10-CM | POA: Diagnosis not present

## 2022-12-23 DIAGNOSIS — M797 Fibromyalgia: Secondary | ICD-10-CM | POA: Diagnosis present

## 2022-12-23 DIAGNOSIS — G43909 Migraine, unspecified, not intractable, without status migrainosus: Secondary | ICD-10-CM | POA: Diagnosis present

## 2022-12-23 DIAGNOSIS — Z9013 Acquired absence of bilateral breasts and nipples: Secondary | ICD-10-CM

## 2022-12-23 DIAGNOSIS — Z85820 Personal history of malignant melanoma of skin: Secondary | ICD-10-CM

## 2022-12-23 DIAGNOSIS — I341 Nonrheumatic mitral (valve) prolapse: Secondary | ICD-10-CM | POA: Diagnosis present

## 2022-12-23 DIAGNOSIS — Z79899 Other long term (current) drug therapy: Secondary | ICD-10-CM | POA: Diagnosis not present

## 2022-12-23 DIAGNOSIS — I10 Essential (primary) hypertension: Secondary | ICD-10-CM | POA: Diagnosis present

## 2022-12-23 DIAGNOSIS — E039 Hypothyroidism, unspecified: Secondary | ICD-10-CM | POA: Diagnosis present

## 2022-12-23 DIAGNOSIS — Z853 Personal history of malignant neoplasm of breast: Secondary | ICD-10-CM

## 2022-12-23 DIAGNOSIS — Z981 Arthrodesis status: Secondary | ICD-10-CM

## 2022-12-23 DIAGNOSIS — I493 Ventricular premature depolarization: Secondary | ICD-10-CM

## 2022-12-23 DIAGNOSIS — I4819 Other persistent atrial fibrillation: Secondary | ICD-10-CM | POA: Diagnosis present

## 2022-12-23 DIAGNOSIS — Z808 Family history of malignant neoplasm of other organs or systems: Secondary | ICD-10-CM

## 2022-12-23 DIAGNOSIS — K219 Gastro-esophageal reflux disease without esophagitis: Secondary | ICD-10-CM | POA: Diagnosis present

## 2022-12-23 DIAGNOSIS — E785 Hyperlipidemia, unspecified: Secondary | ICD-10-CM | POA: Diagnosis present

## 2022-12-23 DIAGNOSIS — Z8051 Family history of malignant neoplasm of kidney: Secondary | ICD-10-CM

## 2022-12-23 DIAGNOSIS — Z9049 Acquired absence of other specified parts of digestive tract: Secondary | ICD-10-CM | POA: Diagnosis not present

## 2022-12-23 DIAGNOSIS — G8929 Other chronic pain: Secondary | ICD-10-CM | POA: Diagnosis present

## 2022-12-23 HISTORY — PX: PVC ABLATION: EP1236

## 2022-12-23 LAB — BASIC METABOLIC PANEL
Anion gap: 7 (ref 5–15)
BUN: 14 mg/dL (ref 8–23)
CO2: 27 mmol/L (ref 22–32)
Calcium: 8.9 mg/dL (ref 8.9–10.3)
Chloride: 105 mmol/L (ref 98–111)
Creatinine, Ser: 0.92 mg/dL (ref 0.44–1.00)
GFR, Estimated: >60 mL/min (ref >60)
Glucose, Bld: 121 mg/dL — ABNORMAL HIGH (ref 70–99)
Potassium: 4.4 mmol/L (ref 3.5–5.1)
Sodium: 139 mmol/L (ref 135–145)

## 2022-12-23 LAB — MAGNESIUM: Magnesium: 2.1 mg/dL (ref 1.7–2.4)

## 2022-12-23 SURGERY — PVC ABLATION
Anesthesia: General

## 2022-12-23 MED ORDER — ACETAMINOPHEN 500 MG PO TABS
1000.0000 mg | ORAL_TABLET | Freq: Four times a day (QID) | ORAL | Status: DC | PRN
  Administered 2022-12-23 – 2022-12-29 (×8): 1000 mg via ORAL
  Filled 2022-12-23 (×8): qty 2

## 2022-12-23 MED ORDER — SODIUM CHLORIDE 0.9% FLUSH: 3.0000 mL | INTRAVENOUS | Status: DC | PRN

## 2022-12-23 MED ORDER — PROPOFOL 10 MG/ML IV BOLUS
INTRAVENOUS | Status: DC | PRN
  Administered 2022-12-23 (×2): 40 mg via INTRAVENOUS

## 2022-12-23 MED ORDER — ESCITALOPRAM OXALATE 10 MG PO TABS
20.0000 mg | ORAL_TABLET | Freq: Every day | ORAL | Status: DC
  Administered 2022-12-23 – 2022-12-28 (×6): 20 mg via ORAL
  Filled 2022-12-23 (×6): qty 2

## 2022-12-23 MED ORDER — FENTANYL CITRATE (PF) 100 MCG/2ML IJ SOLN
INTRAMUSCULAR | Status: AC
  Filled 2022-12-23: qty 2

## 2022-12-23 MED ORDER — APIXABAN 5 MG PO TABS
5.0000 mg | ORAL_TABLET | Freq: Two times a day (BID) | ORAL | Status: DC
  Administered 2022-12-23 – 2022-12-26 (×6): 5 mg via ORAL
  Filled 2022-12-23 (×6): qty 1

## 2022-12-23 MED ORDER — HEPARIN SODIUM (PORCINE) 1000 UNIT/ML IJ SOLN
INTRAMUSCULAR | Status: AC
  Filled 2022-12-23: qty 10

## 2022-12-23 MED ORDER — SODIUM CHLORIDE 0.9% FLUSH
3.0000 mL | Freq: Two times a day (BID) | INTRAVENOUS | Status: DC
  Administered 2022-12-23 – 2022-12-29 (×11): 3 mL via INTRAVENOUS

## 2022-12-23 MED ORDER — SODIUM CHLORIDE 0.9 % IV SOLN: 250.0000 mL | INTRAVENOUS | Status: DC | PRN

## 2022-12-23 MED ORDER — PROPAFENONE HCL ER 325 MG PO CP12
325.0000 mg | ORAL_CAPSULE | Freq: Two times a day (BID) | ORAL | 0 refills | Status: DC
  Filled 2022-12-23: qty 180, 90d supply, fill #0

## 2022-12-23 MED ORDER — ACETAMINOPHEN 500 MG PO TABS
1000.0000 mg | ORAL_TABLET | Freq: Once | ORAL | Status: AC
  Administered 2022-12-23: 1000 mg via ORAL
  Filled 2022-12-23: qty 2

## 2022-12-23 MED ORDER — PHENYLEPHRINE 80 MCG/ML (10ML) SYRINGE FOR IV PUSH (FOR BLOOD PRESSURE SUPPORT)
PREFILLED_SYRINGE | INTRAVENOUS | Status: DC | PRN
  Administered 2022-12-23: 120 ug via INTRAVENOUS

## 2022-12-23 MED ORDER — ALPRAZOLAM 0.25 MG PO TABS
0.2500 mg | ORAL_TABLET | Freq: Every evening | ORAL | Status: DC | PRN
  Administered 2022-12-23 – 2022-12-28 (×6): 0.25 mg via ORAL
  Filled 2022-12-23 (×6): qty 1

## 2022-12-23 MED ORDER — ACETAMINOPHEN 325 MG PO TABS
ORAL_TABLET | ORAL | Status: AC
  Administered 2022-12-23: 650 mg
  Filled 2022-12-23: qty 2

## 2022-12-23 MED ORDER — SODIUM CHLORIDE 0.9% FLUSH
3.0000 mL | Freq: Two times a day (BID) | INTRAVENOUS | Status: DC
  Administered 2022-12-23 – 2022-12-29 (×4): 3 mL via INTRAVENOUS

## 2022-12-23 MED ORDER — HEPARIN SODIUM (PORCINE) 1000 UNIT/ML IJ SOLN
INTRAMUSCULAR | Status: DC | PRN
  Administered 2022-12-23: 1000 [IU] via INTRAVENOUS

## 2022-12-23 MED ORDER — ONDANSETRON HCL 4 MG/2ML IJ SOLN
INTRAMUSCULAR | Status: DC | PRN
  Administered 2022-12-23: 4 mg via INTRAVENOUS

## 2022-12-23 MED ORDER — LIDOCAINE HCL 1 % IJ SOLN
INTRAMUSCULAR | Status: AC
  Filled 2022-12-23: qty 40

## 2022-12-23 MED ORDER — FAMOTIDINE 20 MG PO TABS
20.0000 mg | ORAL_TABLET | ORAL | Status: DC | PRN
  Administered 2022-12-27: 20 mg via ORAL
  Filled 2022-12-23: qty 1

## 2022-12-23 MED ORDER — EZETIMIBE 10 MG PO TABS
10.0000 mg | ORAL_TABLET | Freq: Every day | ORAL | Status: DC
  Administered 2022-12-23 – 2022-12-29 (×7): 10 mg via ORAL
  Filled 2022-12-23 (×7): qty 1

## 2022-12-23 MED ORDER — SODIUM CHLORIDE 0.9 % IV SOLN: INTRAVENOUS | Status: DC

## 2022-12-23 MED ORDER — TRAMADOL HCL 50 MG PO TABS
50.0000 mg | ORAL_TABLET | Freq: Four times a day (QID) | ORAL | Status: DC | PRN
  Administered 2022-12-25 – 2022-12-29 (×8): 50 mg via ORAL
  Filled 2022-12-23 (×8): qty 1

## 2022-12-23 MED ORDER — PROPOFOL 500 MG/50ML IV EMUL
INTRAVENOUS | Status: DC | PRN
  Administered 2022-12-23: 50 ug/kg/min via INTRAVENOUS

## 2022-12-23 MED ORDER — DOCUSATE SODIUM 50 MG PO CAPS
50.0000 mg | ORAL_CAPSULE | Freq: Two times a day (BID) | ORAL | Status: DC | PRN
  Administered 2022-12-26: 50 mg via ORAL
  Filled 2022-12-23 (×2): qty 1

## 2022-12-23 MED ORDER — ONDANSETRON HCL 4 MG/2ML IJ SOLN: 4.0000 mg | Freq: Four times a day (QID) | INTRAMUSCULAR | Status: DC | PRN

## 2022-12-23 MED ORDER — ISOPROTERENOL HCL 0.2 MG/ML IJ SOLN
INTRAMUSCULAR | Status: AC
  Filled 2022-12-23: qty 5

## 2022-12-23 MED ORDER — FENTANYL CITRATE (PF) 250 MCG/5ML IJ SOLN
INTRAMUSCULAR | Status: DC | PRN
  Administered 2022-12-23 (×2): 50 ug via INTRAVENOUS

## 2022-12-23 MED ORDER — HEPARIN (PORCINE) IN NACL 1000-0.9 UT/500ML-% IV SOLN
INTRAVENOUS | Status: DC | PRN
  Administered 2022-12-23 (×2): 500 mL

## 2022-12-23 MED ORDER — THYROID 60 MG PO TABS
120.0000 mg | ORAL_TABLET | Freq: Every day | ORAL | Status: DC
  Administered 2022-12-24 – 2022-12-29 (×6): 120 mg via ORAL
  Filled 2022-12-23 (×6): qty 2

## 2022-12-23 MED ORDER — MIDAZOLAM HCL 2 MG/2ML IJ SOLN
INTRAMUSCULAR | Status: DC | PRN
  Administered 2022-12-23: 2 mg via INTRAVENOUS

## 2022-12-23 MED ORDER — SOTALOL HCL 120 MG PO TABS
120.0000 mg | ORAL_TABLET | Freq: Two times a day (BID) | ORAL | Status: DC
  Administered 2022-12-23: 120 mg via ORAL
  Filled 2022-12-23 (×2): qty 1

## 2022-12-23 MED ORDER — ISOPROTERENOL HCL 0.2 MG/ML IJ SOLN
INTRAVENOUS | Status: DC | PRN
  Administered 2022-12-23: 2 ug/min via INTRAVENOUS

## 2022-12-23 SURGICAL SUPPLY — 14 items
CATH DECANAV D CURVE (CATHETERS) IMPLANT
CATH GE 8FR SOUNDSTAR (CATHETERS) IMPLANT
CATH JOSEPH QUAD ALLRED 6F REP (CATHETERS) IMPLANT
CATH SMTCH THERMOCOOL SF DF (CATHETERS) IMPLANT
CLOSURE PERCLOSE PROSTYLE (VASCULAR PRODUCTS) IMPLANT
PACK EP LATEX FREE (CUSTOM PROCEDURE TRAY) ×1
PACK EP LF (CUSTOM PROCEDURE TRAY) ×1 IMPLANT
PAD DEFIB RADIO PHYSIO CONN (PAD) ×1 IMPLANT
PATCH CARTO3 (PAD) IMPLANT
SHEATH PINNACLE 7F 10CM (SHEATH) IMPLANT
SHEATH PINNACLE 8F 10CM (SHEATH) IMPLANT
SHEATH PINNACLE 9F 10CM (SHEATH) IMPLANT
SHEATH PROBE COVER 6X72 (BAG) IMPLANT
TUBING SMART ABLATE COOLFLOW (TUBING) IMPLANT

## 2022-12-23 NOTE — Discharge Instructions (Addendum)
After Your Pacemaker   You have a [employer] Pacemaker  ACTIVITY Do not lift your arm above shoulder height for 1 week after your procedure. After 7 days, you may progress as below.  You should remove your sling 24 hours after your procedure, unless otherwise instructed by your provider.     Tuesday January 05, 2023  Wednesday January 06, 2023 Thursday January 07, 2023 Friday January 08, 2023   Do not lift, push, pull, or carry anything over 10 pounds with the affected arm until 6 weeks (Tuesday February 09, 2023 ) after your procedure.   You may drive AFTER your wound check, unless you have been told otherwise by your provider.   Ask your healthcare provider when you can go back to work   INCISION/Dressing If you are on a blood thinner such as Coumadin, Xarelto, Eliquis, Plavix, or Pradaxa please confirm with your provider when this should be resumed.  RESUME ELIQUIS SUNDAY, 3/10  If large square, outer bandage is left in place, this can be removed after 24 hours from your procedure. Do not remove steri-strips or glue as below.   Monitor your Pacemaker site for redness, swelling, and drainage. Call the device clinic at [phone] if you experience these symptoms or fever/chills.  If your incision is sealed with Steri-strips or staples, you may shower 7 days after your procedure or when told by your provider. Do not remove the steri-strips or let the shower hit directly on your site. You may wash around your site with soap and water.    If you were discharged in a sling, please do not wear this during the day more than 48 hours after your surgery unless otherwise instructed. This may increase the risk of stiffness and soreness in your shoulder.   Avoid lotions, ointments, or perfumes over your incision until it is well-healed.  You may use a hot tub or a pool AFTER your wound check appointment if the incision is completely closed.  Pacemaker Alerts:  Some alerts are vibratory and others  beep. These are NOT emergencies. Please call our office to let us know. If this occurs at night or on weekends, it can wait until the next business day. Send a remote transmission.  If your device is capable of reading fluid status (for heart failure), you will be offered monthly monitoring to review this with you.   DEVICE MANAGEMENT Remote monitoring is used to monitor your pacemaker from home. This monitoring is scheduled every 91 days by our office. It allows [place] to keep an eye on the functioning of your device to ensure it is working properly. You will routinely see your Electrophysiologist annually (more often if necessary).   You should receive your ID card for your new device in 4-8 weeks. Keep this card with you at all times once received. Consider wearing a medical alert bracelet or necklace.  Your Pacemaker may be MRI compatible. This will be discussed at your next office visit/wound check.  You should avoid contact with strong electric or magnetic fields.   Do not use amateur (ham) radio equipment or electric (arc) welding torches. MP3 player headphones with magnets should not be used. Some devices are safe to use if held at least 12 inches (30 cm) from your Pacemaker. These include power tools, lawn mowers, and speakers. If you are unsure if something is safe to use, ask your health care provider.  When using your cell phone, hold it to the ear that is on the  opposite side from the Pacemaker. Do not leave your cell phone in a pocket over the Pacemaker.  You may safely use electric blankets, heating pads, computers, and microwave ovens.  Call the office right away if: You have chest pain. You feel more short of breath than you have felt before. You feel more light-headed than you have felt before. Your incision starts to open up.  This information is not intended to replace advice given to you by your health care provider. Make sure you discuss any questions you have with your  health care provider.

## 2022-12-23 NOTE — Transfer of Care (Signed)
Immediate Anesthesia Transfer of Care Note  Patient: Patricia Alvarado  Procedure(s) Performed: PVC ABLATION  Patient Location: PACU  Anesthesia Type:MAC  Level of Consciousness: awake, alert , and oriented  Airway & Oxygen Therapy: Patient Spontanous Breathing  Post-op Assessment: Report given to RN and Post -op Vital signs reviewed and stable  Post vital signs: Reviewed and stable  Last Vitals:  Vitals Value Taken Time  BP 157/56 12/23/22 1028  Temp 36.9 C 12/23/22 1020  Pulse 65 12/23/22 1031  Resp 16 12/23/22 1031  SpO2 99 % 12/23/22 1031  Vitals shown include unvalidated device data.  Last Pain:  Vitals:   12/23/22 1006  TempSrc:   PainSc: 0-No pain         Complications: There were no known notable events for this encounter.

## 2022-12-23 NOTE — Anesthesia Postprocedure Evaluation (Signed)
Anesthesia Post Note  Patient: Fiyinfoluwa Brisco  Procedure(s) Performed: PVC ABLATION     Patient location during evaluation: Cath Lab Anesthesia Type: MAC Level of consciousness: awake and alert Pain management: pain level controlled Vital Signs Assessment: post-procedure vital signs reviewed and stable Respiratory status: spontaneous breathing and respiratory function stable Cardiovascular status: stable Postop Assessment: no apparent nausea or vomiting Anesthetic complications: no  There were no known notable events for this encounter.  Last Vitals:  Vitals:   12/23/22 1048 12/23/22 1050  BP: (!) 147/47   Pulse: 65 65  Resp: 20 20  Temp: 36.7 C   SpO2: 94% 95%    Last Pain:  Vitals:   12/23/22 1105  TempSrc:   PainSc: 0-No pain                 Jasmyn Picha DANIEL

## 2022-12-23 NOTE — H&P (Signed)
Electrophysiology Office Note   Date:  12/23/2022   ID:  Tarnya, Zambrano 1946/03/06, MRN [id number]  PCP:  [name], NP  Cardiologist:   Primary Electrophysiologist: Gaye Alken, MD    Chief Complaint: AF   History of Present Illness: Patricia Alvarado is a 77 y.o. female who is being seen today for the evaluation of AF at the request of Constance Haw, MD. Presenting today for electrophysiology evaluation.  She has a history significant for hypertension, PVCs, nonsustained VT, atrial fibrillation.  She is post atrial fibrillation ablation 09/12/2021.  She wore a cardiac monitor that showed a 12.6% PVC burden.  Today, denies symptoms of palpitations, chest pain, shortness of breath, orthopnea, PND, lower extremity edema, claudication, dizziness, presyncope, syncope, bleeding, or neurologic sequela. The patient is tolerating medications without difficulties. Plan PVC ablation today.    Past Medical History:  Diagnosis Date   A-fib [facility]    Arthritis    thumb   Basal cell carcinoma 2001   "forehead, between eyebrows" right arm (2022)   Breast cancer ([place])    Carcinoma of thyroid gland ([place]) 2001   Chronic lower back pain    "worse is across my hips" (05/26/2016)   Depression    Dyslipidemia    Dyspnea    Family history of breast cancer    Family history of kidney cancer    Family history of leukemia    Family history of nonmelanoma skin cancer    Fibrocystic breast    Fibromyalgia    GERD (gastroesophageal reflux disease)    Heart murmur    History of blood transfusion 1992   "after subcutaneous mastectomies"   Hypertension    not on any medications   Hypothyroidism    Malignant melanoma of left ankle ([place]) 2001   Migraine    "visual; 2-3 times/year" (05/26/2016)   Mitral valve prolapse    PONV (postoperative nausea and vomiting)    Ventricular tachycardia (HCC)    Hx of, controlled on sotalol therapy   Past Surgical History:  Procedure  Laterality Date   ABDOMINAL HYSTERECTOMY  1998   ANTERIOR CERVICAL DECOMP/DISCECTOMY FUSION  2001   ATRIAL FIBRILLATION ABLATION N/A 09/12/2021   Procedure: ATRIAL FIBRILLATION ABLATION;  Surgeon: Constance Haw, MD;  Location: [place];  Service: Cardiovascular;  Laterality: N/A;   BACK SURGERY     BASAL CELL CARCINOMA EXCISION  2001   "cut it out & did a flap, on forehead between my eyebrows"   BREAST IMPLANT EXCHANGE Bilateral 2001   [id number]   BREAST IMPLANT EXCHANGE Left 05/05/2021   Procedure: BREAST IMPLANT EXCHANGE;  Surgeon: Wallace Going, DO;  Location: [place];  Service: Plastics;  Laterality: Left;   BREAST IMPLANT REMOVAL Right 02/05/2020   Procedure: REMOVAL RIGHT BREAST IMPLANT AND CAPSULECTOMY;  Surgeon: Jovita Kussmaul, MD;  Location: [place];  Service: General;  Laterality: Right;   BREAST LUMPECTOMY Right 02/05/2020   Procedure: RIGHT BREAST CENTRAL LUMPECTOMY;  Surgeon: Jovita Kussmaul, MD;  Location: [facility];  Service: General;  Laterality: Right;   BREAST RECONSTRUCTION WITH PLACEMENT OF TISSUE EXPANDER AND FLEX HD (ACELLULAR HYDRATED DERMIS) Right 01/20/2021   Procedure: BREAST RECONSTRUCTION WITH PLACEMENT OF TISSUE EXPANDER AND FLEX HD (ACELLULAR HYDRATED DERMIS);  Surgeon: Wallace Going, DO;  Location: [place];  Service: Plastics;  Laterality: Right;  2 hours   CARPAL TUNNEL RELEASE Right 2006   COLONOSCOPY  DILATION AND CURETTAGE OF UTERUS  1970s X 2-3   ELECTROPHYSIOLOGIC STUDY  1994 X 2;2001   "to see it it was sustained VT; cause thyroid levels were causing arrhythmias"   EYE SURGERY  12/2020   cataracts   LAPAROSCOPIC CHOLECYSTECTOMY     MASTECTOMY Bilateral 1992   "subcutaneous"   MELANOMA EXCISION Left 2001   "ankle, stage I"   PLACEMENT OF BREAST IMPLANTS Bilateral 1992   [id number]   REMOVAL OF TISSUE EXPANDER AND PLACEMENT OF IMPLANT Right 05/05/2021   Procedure: REMOVAL OF TISSUE  EXPANDER AND PLACEMENT OF IMPLANT;  Surgeon: Wallace Going, DO;  Location: [place];  Service: Plastics;  Laterality: Right;   TOTAL THYROIDECTOMY  12/1999   "cancer"   VENTRICULAR ABLATION SURGERY  2011   ventricular tchycardia     Current Facility-Administered Medications  Medication Dose Route Frequency Provider Last Rate Last Admin   0.9 %  sodium chloride infusion   Intravenous Continuous Constance Haw, MD 50 mL/hr at 12/23/22 0650 New Bag at 12/23/22 0650    Allergies:   Quinidine, Statins, Tape, Amoxicillin, Atenolol, Dofetilide, Mexiletine hcl, Nadolol, and Vicodin  [hydrocodone-acetaminophen]   Social History:  The patient  reports that she has never smoked. She has never been exposed to tobacco smoke. She has never used smokeless tobacco. She reports that she does not drink alcohol and does not use drugs.   Family History:  The patient's family history includes Arrhythmia in her brother; Basal cell carcinoma (age of onset: 45) in her mother; Breast cancer in an other family member; Breast cancer (age of onset: 84) in her maternal grandmother; Cancer in her maternal aunt; Heart attack in her brother; Heart disease in her brother; Heart disease (age of onset: 78) in her father; Hyperlipidemia (age of onset: 37) in her mother; Hypertension in her brother; Leukemia in her maternal aunt; Lung cancer (age of onset: 71) in her maternal aunt; Osteoporosis in her mother; Other in her brother, father, and paternal aunt; Squamous cell carcinoma (age of onset: 54) in her brother.   ROS:  Please see the history of present illness.   Otherwise, review of systems is positive for none.   All other systems are reviewed and negative.   PHYSICAL EXAM: VS:  BP (!) 163/56   Pulse 76   Temp (!) 97 F (36.1 C) (Temporal)   Resp 17   Ht 5' 4.5" (1.638 m)   Wt 67.1 kg   SpO2 98%   BMI 25.01 kg/m  , BMI Body mass index is 25.01 kg/m. GEN: Well nourished, well developed, in no acute  distress  HEENT: normal  Neck: no JVD, carotid bruits, or masses Cardiac: RRR; no murmurs, rubs, or gallops,no edema  Respiratory:  clear to auscultation bilaterally, normal work of breathing GI: soft, nontender, nondistended, + BS MS: no deformity or atrophy  Skin: warm and dry Neuro:  Strength and sensation are intact Psych: euthymic mood, full affect  Recent Labs: 12/10/2022: BUN 14; Creatinine, Ser 0.70; Hemoglobin 14.2; Platelets 205; Potassium 3.8; Sodium 142    Lipid Panel     Component Value Date/Time   CHOL 155 08/21/2022 0817   TRIG 94 08/21/2022 0817   HDL 74 08/21/2022 0817   CHOLHDL 2.1 08/21/2022 0817   LDLCALC 64 08/21/2022 0817     Wt Readings from Last 3 Encounters:  12/23/22 67.1 kg  11/02/22 68.2 kg  08/31/22 67.7 kg      Other studies Reviewed: Additional studies/ records  that were reviewed today include: TTE 9/14 /21 Review of the above records today demonstrates:  1. Left ventricular ejection fraction, by estimation, is 55 to 60%. The  left ventricle has normal function. The left ventricle has no regional  wall motion abnormalities. Left ventricular diastolic parameters are  consistent with Grade I diastolic  dysfunction (impaired relaxation).   2. Right ventricular systolic function is normal. The right ventricular  size is normal. There is normal pulmonary artery systolic pressure. The  estimated right ventricular systolic pressure is [id number] mmHg.   3. P2 prolpase with late systolic MR that is mild. There is mitral  annular disjunction noted which is associated with PVCs. 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 Mild mitral valve regurgitation.   4. The aortic valve is tricuspid. Aortic valve regurgitation is mild. No  aortic stenosis is present.   5. The inferior vena cava is normal in size with greater than 50%  respiratory variability, suggesting right atrial pressure of 3 mmHg.   6. Evidence of atrial level shunting detected by color flow Doppler.   There is a small patent foramen ovale with predominantly left to right  shunting across the atrial septum.  Cardiac monitor 07/23/2022 personally reviewed Predominant rhythm was sinus rhythm 12.6% supraventricular ectopy Less than 1% ventricular ectopy Triggered episodes associated with PVCs  ASSESSMENT AND PLAN:  1.  Paroxysmal atrial fibrillation: Currently on flecainide 100 mg twice daily, diltiazem 120 mg daily, Eliquis 5 mg twice daily.  CHA2DS2-VASc of 2.  Status post ablation 09/12/2021.  Has remained in sinus rhythm.  Patricia Alvarado continue with current management.  2.  PVCs: Patricia Alvarado has presented today for surgery, with the diagnosis of PVC.  The various methods of treatment have been discussed with the patient and family. After consideration of risks, benefits and other options for treatment, the patient has consented to  Procedure(s): Catheter ablation as a surgical intervention .  Risks include but not limited to complete heart block, stroke, esophageal damage, nerve damage, bleeding, vascular damage, tamponade, perforation, MI, and death. The patient's history has been reviewed, patient examined, no change in status, stable for surgery.  I have reviewed the patient's chart and labs.  Questions were answered to the patient's satisfaction.    Patricia Jakes Curt Bears, MD 12/23/2022 7:23 AM

## 2022-12-23 NOTE — Progress Notes (Signed)
Pt ambulated to and from bathroom with no signs of oozing from right groin site

## 2022-12-23 NOTE — Progress Notes (Signed)
Pharmacy Consult for Sotalol Electrolyte Replacement  Pharmacy consulted to assist in monitoring and replacing electrolytes in this 77 y.o. female admitted on 12/23/2022 undergoing sotalol initiation . First sotalol dose: 12/23/22  Labs:    Component Value Date/Time   K 4.4 12/23/2022 1747   MG 2.1 12/23/2022 1747     Plan: Potassium: K >/= 4: Appropriate to initiate Sotalol, no replacement needed    Magnesium: Mg >2: Appropriate to initiate Sotalol, no replacement needed     Thank you for allowing pharmacy to participate in this patient's care   Einar Grad 12/23/2022  2:50 PM

## 2022-12-24 ENCOUNTER — Other Ambulatory Visit (HOSPITAL_COMMUNITY): Payer: Self-pay

## 2022-12-24 ENCOUNTER — Encounter (HOSPITAL_COMMUNITY): Payer: Self-pay | Admitting: Cardiology

## 2022-12-24 DIAGNOSIS — E039 Hypothyroidism, unspecified: Secondary | ICD-10-CM | POA: Diagnosis present

## 2022-12-24 DIAGNOSIS — Z803 Family history of malignant neoplasm of breast: Secondary | ICD-10-CM | POA: Diagnosis not present

## 2022-12-24 DIAGNOSIS — Z9049 Acquired absence of other specified parts of digestive tract: Secondary | ICD-10-CM | POA: Diagnosis not present

## 2022-12-24 DIAGNOSIS — Z888 Allergy status to other drugs, medicaments and biological substances status: Secondary | ICD-10-CM | POA: Diagnosis not present

## 2022-12-24 DIAGNOSIS — I4729 Other ventricular tachycardia: Secondary | ICD-10-CM | POA: Diagnosis present

## 2022-12-24 DIAGNOSIS — Z79899 Other long term (current) drug therapy: Secondary | ICD-10-CM | POA: Diagnosis not present

## 2022-12-24 DIAGNOSIS — Z9013 Acquired absence of bilateral breasts and nipples: Secondary | ICD-10-CM | POA: Diagnosis not present

## 2022-12-24 DIAGNOSIS — E785 Hyperlipidemia, unspecified: Secondary | ICD-10-CM | POA: Diagnosis present

## 2022-12-24 DIAGNOSIS — M797 Fibromyalgia: Secondary | ICD-10-CM | POA: Diagnosis present

## 2022-12-24 DIAGNOSIS — I472 Ventricular tachycardia, unspecified: Secondary | ICD-10-CM | POA: Diagnosis not present

## 2022-12-24 DIAGNOSIS — Z801 Family history of malignant neoplasm of trachea, bronchus and lung: Secondary | ICD-10-CM | POA: Diagnosis not present

## 2022-12-24 DIAGNOSIS — Z85828 Personal history of other malignant neoplasm of skin: Secondary | ICD-10-CM | POA: Diagnosis not present

## 2022-12-24 DIAGNOSIS — Z8051 Family history of malignant neoplasm of kidney: Secondary | ICD-10-CM | POA: Diagnosis not present

## 2022-12-24 DIAGNOSIS — G43909 Migraine, unspecified, not intractable, without status migrainosus: Secondary | ICD-10-CM | POA: Diagnosis present

## 2022-12-24 DIAGNOSIS — I340 Nonrheumatic mitral (valve) insufficiency: Secondary | ICD-10-CM | POA: Diagnosis not present

## 2022-12-24 DIAGNOSIS — Z85820 Personal history of malignant melanoma of skin: Secondary | ICD-10-CM | POA: Diagnosis not present

## 2022-12-24 DIAGNOSIS — Z806 Family history of leukemia: Secondary | ICD-10-CM | POA: Diagnosis not present

## 2022-12-24 DIAGNOSIS — Z853 Personal history of malignant neoplasm of breast: Secondary | ICD-10-CM | POA: Diagnosis not present

## 2022-12-24 DIAGNOSIS — Z85850 Personal history of malignant neoplasm of thyroid: Secondary | ICD-10-CM | POA: Diagnosis not present

## 2022-12-24 DIAGNOSIS — I4819 Other persistent atrial fibrillation: Secondary | ICD-10-CM | POA: Diagnosis present

## 2022-12-24 DIAGNOSIS — I10 Essential (primary) hypertension: Secondary | ICD-10-CM | POA: Diagnosis present

## 2022-12-24 DIAGNOSIS — F32A Depression, unspecified: Secondary | ICD-10-CM | POA: Diagnosis present

## 2022-12-24 DIAGNOSIS — I341 Nonrheumatic mitral (valve) prolapse: Secondary | ICD-10-CM | POA: Diagnosis present

## 2022-12-24 DIAGNOSIS — I493 Ventricular premature depolarization: Secondary | ICD-10-CM

## 2022-12-24 DIAGNOSIS — Z981 Arthrodesis status: Secondary | ICD-10-CM | POA: Diagnosis not present

## 2022-12-24 DIAGNOSIS — Z880 Allergy status to penicillin: Secondary | ICD-10-CM | POA: Diagnosis not present

## 2022-12-24 LAB — BASIC METABOLIC PANEL
Anion gap: 3 — ABNORMAL LOW (ref 5–15)
BUN: 16 mg/dL (ref 8–23)
CO2: 27 mmol/L (ref 22–32)
Calcium: 8.8 mg/dL — ABNORMAL LOW (ref 8.9–10.3)
Chloride: 106 mmol/L (ref 98–111)
Creatinine, Ser: 0.87 mg/dL (ref 0.44–1.00)
GFR, Estimated: >60 mL/min (ref >60)
Glucose, Bld: 88 mg/dL (ref 70–99)
Potassium: 4.1 mmol/L (ref 3.5–5.1)
Sodium: 136 mmol/L (ref 135–145)

## 2022-12-24 LAB — MAGNESIUM: Magnesium: 2.1 mg/dL (ref 1.7–2.4)

## 2022-12-24 MED ORDER — LORATADINE 10 MG PO TABS
10.0000 mg | ORAL_TABLET | Freq: Every day | ORAL | Status: DC
  Administered 2022-12-24 – 2022-12-29 (×6): 10 mg via ORAL
  Filled 2022-12-24 (×6): qty 1

## 2022-12-24 MED ORDER — SOTALOL HCL 80 MG PO TABS
160.0000 mg | ORAL_TABLET | Freq: Two times a day (BID) | ORAL | Status: DC
  Administered 2022-12-24 – 2022-12-29 (×11): 160 mg via ORAL
  Filled 2022-12-24 (×13): qty 2

## 2022-12-24 MED ORDER — SIMETHICONE 80 MG PO CHEW
160.0000 mg | CHEWABLE_TABLET | Freq: Four times a day (QID) | ORAL | Status: DC | PRN
  Administered 2022-12-24 – 2022-12-27 (×2): 160 mg via ORAL
  Filled 2022-12-24 (×2): qty 2

## 2022-12-24 MED ORDER — ESCITALOPRAM OXALATE 20 MG PO TABS
20.0000 mg | ORAL_TABLET | Freq: Every day | ORAL | 4 refills | Status: DC
  Filled 2022-12-24: qty 90, 90d supply, fill #0
  Filled 2023-03-19: qty 90, 90d supply, fill #1
  Filled 2023-06-17: qty 90, 90d supply, fill #2
  Filled 2023-09-15: qty 90, 90d supply, fill #3
  Filled 2023-12-15: qty 90, 90d supply, fill #4

## 2022-12-24 MED FILL — Fentanyl Citrate Preservative Free (PF) Inj 100 MCG/2ML: INTRAMUSCULAR | Qty: 2 | Status: AC

## 2022-12-24 MED FILL — Lidocaine HCl Local Preservative Free (PF) Inj 1%: INTRAMUSCULAR | Qty: 30 | Status: AC

## 2022-12-24 NOTE — Care Management Obs Status (Signed)
[facility] NOTIFICATION   Patient Details  Name: Patricia Alvarado MRN: [id number] Date of Birth: 1946/04/20   Medicare Observation Status Notification Given:  Yes    Pollie Friar, RN 12/24/2022, 7:54 AM

## 2022-12-24 NOTE — TOC Benefit Eligibility Note (Signed)
Patient Teacher, English as a foreign language completed.    The patient is currently admitted and upon discharge could be taking sotalol (Betap[place]) 160 mg tablets.  The current 30 day co-pay is $5.00.   The patient is insured through [place], [place] Patient Advocate Specialist [place] Patient Advocate Team Direct Number: [phone]  Fax: [phone]

## 2022-12-24 NOTE — Care Management (Signed)
  Transition of Care [facility]) Screening Note   Patient Details  Name: Patricia Alvarado Date of Birth: 03-28-1946   Transition of Care [facility]) CM/SW Contact:    Bethena Roys, RN Phone Number: 12/24/2022, 12:50 PM    Transition of Care Department [facility]) has reviewed the patient. Patient presented for Atrial Fib-Sotalol is being initiated. Benefits check submitted for cost. Case Manager will continue to monitor patient advancement through interdisciplinary progression rounds. If new patient transition needs arise, please place a TOC consult.

## 2022-12-24 NOTE — Care Management CC44 (Signed)
Condition Code 44 Documentation Completed  Patient Details  Name: Patricia Alvarado MRN: [id number] Date of Birth: [birth year]   Condition Code 44 given:  Yes Patient signature on Condition Code 44 notice:  Yes Documentation of 2 MD's agreement:  Yes Code 44 added to claim:  Yes    Pollie Friar, RN 12/24/2022, 7:54 AM

## 2022-12-24 NOTE — Progress Notes (Signed)
Pharmacy Consult for Sotalol Electrolyte Replacement- Follow Up  Pharmacy consulted to assist in monitoring and replacing electrolytes in this 77 y.o. female admitted on 12/23/2022 undergoing sotalol initiation .   Labs:    Component Value Date/Time   K 4.1 12/24/2022 0157   MG 2.1 12/24/2022 0157     Plan: Potassium: K >/= 4: No additional supplementation needed  Magnesium: Mg > 2: No additional supplementation needed    Thank you for allowing pharmacy to participate in this patient's care   Hildred Laser, PharmD Clinical Pharmacist **Pharmacist phone directory can now be found on [email or web address] ([email or web address]).  Listed under [place].

## 2022-12-24 NOTE — Progress Notes (Addendum)
Electrophysiology Rounding Note  Patient Name: Patricia Alvarado Date of Encounter: 12/24/2022  Primary Cardiologist: None  Electrophysiologist: Ha Placeres Meredith Leeds, MD    Subjective   Pt remains in NSR with PVCs on Sotalol '120mg'$  BID   QTc from EKG last pm shows stable QT at 479m, stable QTc at 4468m The patient is doing well today.  At this time, the patient denies chest pain, shortness of breath, or any new concerns.  Inpatient Medications    Scheduled Meds:  apixaban  5 mg Oral BID   escitalopram  20 mg Oral QHS   ezetimibe  10 mg Oral Daily   sodium chloride flush  3 mL Intravenous Q12H   sodium chloride flush  3 mL Intravenous Q12H   sotalol  120 mg Oral Q12H   thyroid  120 mg Oral QAC breakfast   Continuous Infusions:  sodium chloride     sodium chloride     PRN Meds: sodium chloride, sodium chloride, acetaminophen, ALPRAZolam, docusate sodium, famotidine, sodium chloride flush, sodium chloride flush, traMADol   Vital Signs    Vitals:   12/23/22 2035 12/23/22 2333 12/24/22 0424 12/24/22 0732  BP: (!) 158/79 (!) 118/53 (!) 128/55 136/68  Pulse: 78 60 (!) 58 (!) 56  Resp: '20 18 18 16  '$ Temp: 98.2 F (36.8 C) 98 F (36.7 C) 97.7 F (36.5 C) 98.7 F (37.1 C)  TempSrc: Oral Oral Oral Oral  SpO2: 98% 94% 95% 97%  Weight:      Height:        Intake/Output Summary (Last 24 hours) at 12/24/2022 0933 Last data filed at 12/23/2022 [id number] per 24 hour  Intake --  Output 5 ml  Net -5 ml   Filed Weights   12/23/22 0620 12/23/22 1816  Weight: 67.1 kg 67.6 kg    Physical Exam    GEN- NAD, A&O x 3. Normal affect.  Lungs- CTAB, Normal effort.  Heart- Regular rate and rhythm. No M/G/R GI- Soft, NT, ND Extremities- No clubbing, cyanosis, or edema Skin- no rash or lesion  Labs    CBC No results for input(s): "WBC", "NEUTROABS", "HGB", "HCT", "MCV", "PLT" in the last 72 hours. Basic Metabolic Panel Recent Labs    12/23/22 1747 12/24/22 0157  NA  139 136  K 4.4 4.1  CL 105 106  CO2 27 27  GLUCOSE 121* 88  BUN 14 16  CREATININE 0.92 0.87  CALCIUM 8.9 8.8*  MG 2.1 2.1    Telemetry    SR with PVCs, rate 60-70s (personally reviewed)  Patient Profile     Patricia Alvarado a [age].o. female with a past medical history significant for persistent atrial fibrillation.  They were admitted for tikosyn load.   Assessment & Plan    PVC  persistent Afib Patient had multiple PVC morphologies yesterday during EP study, so no ablation required.  Pt remains in NSR with less PVCs. Increase sotalol to '160mg'$  BID Continue Eliquis Creatinine, ser  0.87 (02/29 0157) Magnesium  2.1 (02/29 0157) Potassium4.1 (02/29 0157) No electrolyte supplementation needed     For questions or updates, please contact [facility] consult [email or web address] for contact info under Cardiology/STEMI.  Signed, SuMamie LeversNP  12/24/2022, 9:33 AM    I have seen and examined this patient with SuMamie Levers Agree with above, note added to reflect my findings.  Remains on sotalol.  PVC burden reduced.  Feeling well.  GEN: Well nourished, well developed, in  no acute distress  HEENT: normal  Neck: no JVD, carotid bruits, or masses Cardiac: RRR; no murmurs, rubs, or gallops,no edema  Respiratory:  clear to auscultation bilaterally, normal work of breathing GI: soft, nontender, nondistended, + BS MS: no deformity or atrophy  Skin: warm and dry Neuro:  Strength and sensation are intact Psych: euthymic mood, full affect   PVCs: EP study yesterday with multiple PVCs of multiple morphologies.  Due to that no ablation was performed.  Admission today for sotalol load.  QTc remained stable.  Kalyn Dimattia increase dose to 160 mg twice daily. Persistent atrial fibrillation: Currently on Eliquis.  Post ablation remains in sinus rhythm.  Iliya Spivack M. Trevonn Hallum MD 12/24/2022 10:23 AM

## 2022-12-24 NOTE — Progress Notes (Signed)
Morning EKG reviewed     Shows stable QTc at ~450 ms.  Continue  Sotalol 160 mg BID.   Potassium4.1 (02/29 0157) Magnesium  2.1 (02/29 0157) Creatinine, ser  0.87 (02/29 0157)  Plan for home Friday if QTc remains stable   Shirley Friar, [place]  12/24/2022 4:15 PM

## 2022-12-25 ENCOUNTER — Inpatient Hospital Stay (HOSPITAL_COMMUNITY): Payer: PPO

## 2022-12-25 DIAGNOSIS — I340 Nonrheumatic mitral (valve) insufficiency: Secondary | ICD-10-CM | POA: Diagnosis not present

## 2022-12-25 LAB — CBC
HCT: 37.3 % (ref 36.0–46.0)
Hemoglobin: 12.9 g/dL (ref 12.0–15.0)
MCH: 33.2 pg (ref 26.0–34.0)
MCHC: 34.6 g/dL (ref 30.0–36.0)
MCV: 95.9 fL (ref 80.0–100.0)
Platelets: 177 10*3/uL (ref 150–400)
RBC: 3.89 MIL/uL (ref 3.87–5.11)
RDW: 12.6 % (ref 11.5–15.5)
WBC: 5.3 10*3/uL (ref 4.0–10.5)
nRBC: 0 % (ref 0.0–0.2)

## 2022-12-25 LAB — BASIC METABOLIC PANEL
Anion gap: 10 (ref 5–15)
BUN: 11 mg/dL (ref 8–23)
CO2: 24 mmol/L (ref 22–32)
Calcium: 9.4 mg/dL (ref 8.9–10.3)
Chloride: 104 mmol/L (ref 98–111)
Creatinine, Ser: 0.66 mg/dL (ref 0.44–1.00)
GFR, Estimated: >60 mL/min (ref >60)
Glucose, Bld: 91 mg/dL (ref 70–99)
Potassium: 3.9 mmol/L (ref 3.5–5.1)
Sodium: 138 mmol/L (ref 135–145)

## 2022-12-25 LAB — MAGNESIUM: Magnesium: 2 mg/dL (ref 1.7–2.4)

## 2022-12-25 MED ORDER — GADOBUTROL 1 MMOL/ML IV SOLN
10.0000 mL | Freq: Once | INTRAVENOUS | Status: AC | PRN
  Administered 2022-12-25: 10 mL via INTRAVENOUS

## 2022-12-25 MED ORDER — POTASSIUM CHLORIDE CRYS ER 20 MEQ PO TBCR
40.0000 meq | EXTENDED_RELEASE_TABLET | Freq: Once | ORAL | Status: AC
  Administered 2022-12-25: 40 meq via ORAL
  Filled 2022-12-25: qty 2

## 2022-12-25 MED ORDER — MAGNESIUM SULFATE 2 GM/50ML IV SOLN
2.0000 g | Freq: Once | INTRAVENOUS | Status: AC
  Administered 2022-12-25: 2 g via INTRAVENOUS
  Filled 2022-12-25: qty 50

## 2022-12-25 NOTE — Progress Notes (Signed)
Pharmacy Consult for Sotalol Electrolyte Replacement- Follow Up  Pharmacy consulted to assist in monitoring and replacing electrolytes in this 77 y.o. female admitted on 12/23/2022 undergoing sotalol initiation . First sotalol dose: 120 BID, increased to 160 BID 2/29.  Labs:    Component Value Date/Time   K 3.9 12/25/2022 0600   MG 2.0 12/25/2022 0600     Plan: Potassium: K 3.8-3.9:  Give KCl 40 mEq po x1   Magnesium: Mg 1.8-2: Give Mg 2 gm IV x1    Thank you for allowing pharmacy to participate in this patient's care   Marguerite Olea, [facility] Clinical Pharmacist  12/25/2022 7:43 AM   [facility] pharmacy phone numbers are listed on [email or web address]

## 2022-12-25 NOTE — Progress Notes (Signed)
Pt had a 31 beat run of aberrant beats at 0521. Pt assessed, denies any pain or shortness of breath.BP 148/58, 82 MAP, HR 58. Strip saves per CCMD.

## 2022-12-25 NOTE — Progress Notes (Signed)
EKG from yesterday evening 12/24/2022 reviewed   Shows  NSR at 52 bpm with stable QT at 416m, QTc at 447 ms.  She had a run of VT at 520am this morning; did not start with PVC. Long discussion with Dr. CCurt Bearsthis AM regarding continuing sotalol or trialing a different AAD (propafenone or amiodarone). Patient wishes to continue sotalol at this time.     Continue   Sotalol 160  mcg BID.    Potassium3.9 (03/01 0600) Magnesium  2.0 (03/01 0600) Creatinine, ser  0.66 (03/01 0600)   Plan for home this afternoon if QTc remains stable.    SMamie Levers NP  12/25/2022 7:43 AM

## 2022-12-25 NOTE — Care Management Important Message (Signed)
Important Message  Patient Details  Name: Patricia Alvarado MRN: [id number] Date of Birth: 1946/10/11   Medicare Important Message Given:  Yes     Shelda Altes 12/25/2022, 9:44 AM

## 2022-12-25 NOTE — Progress Notes (Addendum)
Electrophysiology Rounding Note  Patient Name: Patricia Alvarado Date of Encounter: 12/25/2022  Primary Cardiologist: None  Electrophysiologist: Sharrell Krawiec Meredith Leeds, MD    Subjective   Pt remains in NSR, having less palpitations. Woke up this AM to alarm ringing, looked up to monitor and saw run of VT. She is anxious about this because of her family history of arhythmia    Inpatient Medications    Scheduled Meds:  apixaban  5 mg Oral BID   escitalopram  20 mg Oral QHS   ezetimibe  10 mg Oral Daily   loratadine  10 mg Oral Daily   sodium chloride flush  3 mL Intravenous Q12H   sodium chloride flush  3 mL Intravenous Q12H   sotalol  160 mg Oral Q12H   thyroid  120 mg Oral QAC breakfast   Continuous Infusions:  sodium chloride     sodium chloride     PRN Meds: sodium chloride, sodium chloride, acetaminophen, ALPRAZolam, docusate sodium, famotidine, simethicone, sodium chloride flush, sodium chloride flush, traMADol   Vital Signs    Vitals:   12/25/22 0012 12/25/22 0529 12/25/22 0836 12/25/22 0840  BP: (!) 131/55  (!) 153/58   Pulse: (!) 51  62   Resp: '20 20 17   '$ Temp: 97.7 F (36.5 C) 97.8 F (36.6 C) 97.6 F (36.4 C)   TempSrc: Oral Oral Oral   SpO2: 95%  99% 99%  Weight:      Height:       No intake or output data in the 24 hours ending 12/25/22 1233  Filed Weights   12/23/22 0620 12/23/22 1816  Weight: 67.1 kg 67.6 kg    Physical Exam    GEN- NAD, A&O x 3. Normal affect.  Lungs- CTAB, Normal effort.  Heart- Regular rate and rhythm. No M/G/R GI- Soft, NT, ND Extremities- No clubbing, cyanosis, or edema Skin- no rash or lesion  Labs    CBC Recent Labs    12/25/22 0600  WBC 5.3  HGB 12.9  HCT 37.3  MCV 95.9  PLT [id number]   Basic Metabolic Panel Recent Labs    12/24/22 0157 12/25/22 0600  NA 136 138  K 4.1 3.9  CL 106 104  CO2 27 24  GLUCOSE 88 91  BUN 16 11  CREATININE 0.87 0.66  CALCIUM 8.8* 9.4  MG 2.1 2.0     Telemetry    SR  with PVCs, rate 60-70s (personally reviewed)  Patient Profile     Patricia Alvarado is a 77 y.o. female with a past medical history significant for persistent atrial fibrillation, PVCs  They were admitted for PVC ablation and have remained inpatient for sotalol load.   Assessment & Plan    PVC  persistent Afib Patient had multiple PVC morphologies during EP study, so no ablation required.  Pt remains in NSR with less PVCs. Run of monomorphic VT this AM, not triggered by PVC.  Shared decision making with patient this AM regarding whether to continue sotalol or trial different AAD (propafenone vs amiodarone). She prefers to continue with sotalol Given new VT, Patricia Alvarado obtain cMRI hopefully today. Continue sotalol '160mg'$  BID Continue Eliquis Creatinine, ser  0.66 (03/01 0600) Magnesium  2.0 (03/01 0600) Potassium3.9 (03/01 0600) Supplement both K and Mg per protocol     For questions or updates, please contact Patricia Alvarado Please consult [email or web address] for contact info under Cardiology/STEMI.  Signed, Patricia Levers, NP  12/25/2022, 12:33 PM    I have  seen and examined this patient with Patricia Alvarado.  Agree with above, note added to reflect my findings.  Had a prolonged run of ventricular tachycardia last night.  Continues to be symptomatic with PVCs, though burden is reduced.  GEN: Well nourished, well developed, in no acute distress  HEENT: normal  Neck: no JVD, carotid bruits, or masses Cardiac: RRR; no murmurs, rubs, or gallops,no edema  Respiratory:  clear to auscultation bilaterally, normal work of breathing GI: soft, nontender, nondistended, + BS MS: no deformity or atrophy  Skin: warm and dry Neuro:  Strength and sensation are intact Psych: euthymic mood, full affect   PVCs/VT: Had a long run of ventricular tachycardia overnight.  This is not been documented previously.  She is quite nervous about her VT.  Patricia Alvarado plan for cardiac MRI.  Patricia Alvarado continue her current dose of  sotalol.  Her VT was not R on T and was not polymorphic.  Depending on the results of her MRI, may need alternative medications versus ICD.  Patricia Alvarado M. Patricia Rothman MD 12/25/2022 4:08 PM

## 2022-12-26 ENCOUNTER — Encounter (HOSPITAL_COMMUNITY): Payer: Self-pay | Admitting: Cardiology

## 2022-12-26 LAB — BASIC METABOLIC PANEL
Anion gap: 4 — ABNORMAL LOW (ref 5–15)
BUN: 19 mg/dL (ref 8–23)
CO2: 27 mmol/L (ref 22–32)
Calcium: 9.3 mg/dL (ref 8.9–10.3)
Chloride: 106 mmol/L (ref 98–111)
Creatinine, Ser: 0.81 mg/dL (ref 0.44–1.00)
GFR, Estimated: >60 mL/min (ref >60)
Glucose, Bld: 87 mg/dL (ref 70–99)
Potassium: 4.1 mmol/L (ref 3.5–5.1)
Sodium: 137 mmol/L (ref 135–145)

## 2022-12-26 LAB — MAGNESIUM: Magnesium: 2.2 mg/dL (ref 1.7–2.4)

## 2022-12-26 MED ORDER — METHOCARBAMOL 500 MG PO TABS
500.0000 mg | ORAL_TABLET | Freq: Three times a day (TID) | ORAL | Status: DC | PRN
  Administered 2022-12-26 – 2022-12-29 (×7): 500 mg via ORAL
  Filled 2022-12-26 (×7): qty 1

## 2022-12-26 MED ORDER — ORAL CARE MOUTH RINSE: 15.0000 mL | OROMUCOSAL | Status: DC | PRN

## 2022-12-26 NOTE — Progress Notes (Signed)
Telemetry notified patient had 4 beat run of VT at 0907hrs and 6 beat run at 0923hrs. Patient denies CP/SOB/dizziness.  States she was talking on phone.  BP 147/66, HR 64 SR. Dr. Curt Bears notified.  Will continue to monitor.

## 2022-12-26 NOTE — Progress Notes (Signed)
Electrophysiology Rounding Note  Patient Name: Patricia Alvarado Date of Encounter: 12/26/2022  Primary Cardiologist: None  Electrophysiologist: Jailani Hogans Meredith Leeds, MD    Subjective   Patient remains in sinus rhythm with PVCs.  Had short runs of nonsustained VT.  Tolerating sotalol.   Inpatient Medications    Scheduled Meds:  apixaban  5 mg Oral BID   escitalopram  20 mg Oral QHS   ezetimibe  10 mg Oral Daily   loratadine  10 mg Oral Daily   sodium chloride flush  3 mL Intravenous Q12H   sodium chloride flush  3 mL Intravenous Q12H   sotalol  160 mg Oral Q12H   thyroid  120 mg Oral QAC breakfast   Continuous Infusions:  sodium chloride     sodium chloride     PRN Meds: sodium chloride, sodium chloride, acetaminophen, ALPRAZolam, docusate sodium, famotidine, mouth rinse, simethicone, sodium chloride flush, sodium chloride flush, traMADol   Vital Signs    Vitals:   12/25/22 2015 12/26/22 0516 12/26/22 0738 12/26/22 0929  BP: (!) 144/52 (!) 116/46 131/69 (!) 147/66  Pulse: 62 (!) 57 65 64  Resp: '16 20 18 18  '$ Temp: 97.9 F (36.6 C) 97.6 F (36.4 C) 98 F (36.7 C)   TempSrc: Oral Oral Oral   SpO2: 98% 95% 94% 95%  Weight:      Height:        Intake/Output Summary (Last 24 hours) at 12/26/2022 1104 Last data filed at 12/26/2022 0900 Gross per 24 hour  Intake 240 ml  Output --  Net 240 ml    Filed Weights   12/23/22 0620 12/23/22 1816  Weight: 67.1 kg 67.6 kg    Physical Exam    GEN: Well nourished, well developed, in no acute distress  HEENT: normal  Neck: no JVD, carotid bruits, or masses Cardiac: RRR; no murmurs, rubs, or gallops,no edema  Respiratory:  clear to auscultation bilaterally, normal work of breathing GI: soft, nontender, nondistended, + BS MS: no deformity or atrophy  Skin: warm and dry Neuro:  Strength and sensation are intact Psych: euthymic mood, full affect   Labs    CBC Recent Labs    12/25/22 0600  WBC 5.3  HGB 12.9  HCT  37.3  MCV 95.9  PLT 123XX123    Basic Metabolic Panel Recent Labs    12/25/22 0600 12/26/22 0810  NA 138 137  K 3.9 4.1  CL 104 106  CO2 24 27  GLUCOSE 91 87  BUN 11 19  CREATININE 0.66 0.81  CALCIUM 9.4 9.3  MG 2.0 2.2     Telemetry    SR with PVCs, rate 60-70s (personally reviewed)  Patient Profile     Patricia Alvarado is a 77 y.o. female with a past medical history significant for persistent atrial fibrillation, PVCs  They were admitted for PVC ablation and have remained inpatient for sotalol load.   Assessment & Plan    1.  PVCs/nonsustained VT: Has continued to have PVCs despite the use of sotalol.  Cardiac MRI showed mitral valve disjunction but no LGE.  Due to her mitral valve disjunction and ventricular tachycardia, ICD implant would be likely beneficial.  Patricia Alvarado continue to monitor through the weekend and likely plan for ICD on Monday.  2.  Persistent atrial fibrillation: Has had no atrial fibrillation since admission to the hospital.  Patricia Alvarado hold Eliquis for likely ICD implant.    For questions or updates, please contact [place] Please  consult [email or web address] for contact info under Cardiology/STEMI.  Signed, Yostin Malacara Meredith Leeds, MD  12/26/2022, 11:04 AM

## 2022-12-26 NOTE — Progress Notes (Signed)
Pharmacy Consult for Sotalol Electrolyte Replacement- Follow Up  Pharmacy consulted to assist in monitoring and replacing electrolytes in this 77 y.o. female admitted on 12/23/2022 undergoing sotalol initiation . First sotalol dose: 120 BID, increased to 160 BID 2/29.  Labs:    Component Value Date/Time   K 4.1 12/26/2022 0810   MG 2.2 12/26/2022 0810     Plan: Potassium: K >/= 4: No additional supplementation needed  Magnesium: Mg > 2: No additional supplementation needed   Thank you for allowing pharmacy to participate in this patient's care   Francena Hanly, PharmD Pharmacy Resident  12/26/2022 9:14 AM

## 2022-12-26 NOTE — Plan of Care (Signed)
  Problem: Cardiac: Goal: Ability to maintain adequate cardiovascular perfusion will improve Outcome: Progressing Goal: Vascular access site(s) Level 0-1 will be maintained Outcome: Progressing   Problem: Health Behavior/ Discharge Planning: Goal: Ability to safely manage health related needs after discharge Outcome: Progressing   Problem: Education: Goal: Knowledge of General Education information will improve Description: Including pain rating scale, medication(s)/side effects and non-pharmacologic comfort measures Outcome: Progressing   Problem: Clinical Measurements: Goal: Cardiovascular complication will be avoided Outcome: Progressing   Problem: Safety: Goal: Ability to remain free from injury will improve Outcome: Progressing   Problem: Pain Managment: Goal: General experience of comfort will improve Outcome: Progressing

## 2022-12-27 LAB — BASIC METABOLIC PANEL
Anion gap: 9 (ref 5–15)
BUN: 22 mg/dL (ref 8–23)
CO2: 24 mmol/L (ref 22–32)
Calcium: 9.2 mg/dL (ref 8.9–10.3)
Chloride: 106 mmol/L (ref 98–111)
Creatinine, Ser: 0.62 mg/dL (ref 0.44–1.00)
GFR, Estimated: >60 mL/min (ref >60)
Glucose, Bld: 87 mg/dL (ref 70–99)
Potassium: 4.1 mmol/L (ref 3.5–5.1)
Sodium: 139 mmol/L (ref 135–145)

## 2022-12-27 LAB — MAGNESIUM: Magnesium: 2 mg/dL (ref 1.7–2.4)

## 2022-12-27 MED ORDER — MAGNESIUM SULFATE 2 GM/50ML IV SOLN
2.0000 g | Freq: Once | INTRAVENOUS | Status: AC
  Administered 2022-12-27: 2 g via INTRAVENOUS
  Filled 2022-12-27: qty 50

## 2022-12-27 MED ORDER — OFF THE BEAT BOOK
Freq: Once | Status: AC
  Filled 2022-12-27: qty 1

## 2022-12-27 NOTE — Progress Notes (Signed)
Qtc post Sotalol dose 468. Plan of care ongoing

## 2022-12-27 NOTE — Progress Notes (Signed)
Electrophysiology Rounding Note  Patient Name: Patricia Alvarado Date of Encounter: 12/27/2022  Primary Cardiologist: None  Electrophysiologist: Lauren Modisette Meredith Leeds, MD    Subjective   Continuing to have PVCs but with reduced burden.  Having runs of VT up to 6 beats.  Otherwise no major complaints.   Inpatient Medications    Scheduled Meds:  escitalopram  20 mg Oral QHS   ezetimibe  10 mg Oral Daily   loratadine  10 mg Oral Daily   sodium chloride flush  3 mL Intravenous Q12H   sodium chloride flush  3 mL Intravenous Q12H   sotalol  160 mg Oral Q12H   thyroid  120 mg Oral QAC breakfast   Continuous Infusions:  sodium chloride     sodium chloride     magnesium sulfate bolus IVPB 2 g (12/27/22 0837)   PRN Meds: sodium chloride, sodium chloride, acetaminophen, ALPRAZolam, docusate sodium, famotidine, methocarbamol, mouth rinse, simethicone, sodium chloride flush, sodium chloride flush, traMADol   Vital Signs    Vitals:   12/26/22 1617 12/26/22 1947 12/26/22 2120 12/27/22 0531  BP: (!) 121/57 124/74 (!) 108/54 115/61  Pulse: (!) 57 85 (!) 59 (!) 57  Resp: '18 20 18 16  '$ Temp: 97.7 F (36.5 C) 98.9 F (37.2 C) 98.3 F (36.8 C) 97.7 F (36.5 C)  TempSrc: Oral Oral Oral Oral  SpO2: 96% 98% 97% 97%  Weight:      Height:        Intake/Output Summary (Last 24 hours) at 12/27/2022 0935 Last data filed at 12/26/2022 2100 Gross per 24 hour  Intake 720 ml  Output --  Net 720 ml    Filed Weights   12/23/22 0620 12/23/22 1816  Weight: 67.1 kg 67.6 kg    Physical Exam    GEN: Well nourished, well developed, in no acute distress  HEENT: normal  Neck: no JVD, carotid bruits, or masses Cardiac: RRR; no murmurs, rubs, or gallops,no edema  Respiratory:  clear to auscultation bilaterally, normal work of breathing GI: soft, nontender, nondistended, + BS MS: no deformity or atrophy  Skin: warm and dry Neuro:  Strength and sensation are intact Psych: euthymic mood, full  affect   Labs    CBC Recent Labs    12/25/22 0600  WBC 5.3  HGB 12.9  HCT 37.3  MCV 95.9  PLT 123XX123    Basic Metabolic Panel Recent Labs    12/26/22 0810 12/27/22 0316  NA 137 139  K 4.1 4.1  CL 106 106  CO2 27 24  GLUCOSE 87 87  BUN 19 22  CREATININE 0.81 0.62  CALCIUM 9.3 9.2  MG 2.2 2.0     Telemetry    SR with PVCs, rate 60-70s (personally reviewed)  Patient Profile     Patricia Alvarado is a 77 y.o. female with a past medical history significant for persistent atrial fibrillation, PVCs  They were admitted for PVC ablation and have remained inpatient for sotalol load.   Assessment & Plan    1.  PVCs/nonsustained VT: Is continue to have PVCs despite the use of sotalol.  Cardiac MRI shows mitral annular disjunction but no LGE.  Mitral annular dysfunction and episodes of ventricular tachycardia some of which have been quite prolonged, Prescious Hurless plan for ICD implant tomorrow.  Naziah Portee make n.p.o. after midnight tonight.  2.  Persistent atrial fibrillation: None since admission to the hospital.  Continue to hold Eliquis for likely ICD implant.    For questions  or updates, please contact [place] Please consult [email or web address] for contact info under Cardiology/STEMI.  Signed, Asante Blanda Meredith Leeds, MD  12/27/2022, 9:35 AM

## 2022-12-27 NOTE — Progress Notes (Signed)
Pharmacy Consult for Sotalol Electrolyte Replacement- Follow Up  Pharmacy consulted to assist in monitoring and replacing electrolytes in this 77 y.o. female admitted on 12/23/2022 undergoing sotalol initiation . First sotalol dose: 120 BID, increased to 160 BID 2/29.  Labs:    Component Value Date/Time   K 4.1 12/27/2022 0316   MG 2.0 12/27/2022 0316     Plan: Potassium: K >/= 4: No additional supplementation needed  Magnesium: Mg 1.8-2: Give Mg 2 gm IV x1    Thank you for allowing pharmacy to participate in this patient's care   Francena Hanly, PharmD Pharmacy Resident  12/27/2022 7:27 AM

## 2022-12-28 ENCOUNTER — Encounter: Payer: Self-pay | Admitting: Cardiology

## 2022-12-28 ENCOUNTER — Encounter (HOSPITAL_COMMUNITY)
Admission: RE | Disposition: A | Discharge status: Home / Self Care | Payer: Self-pay | Source: Home / Self Care | Attending: Cardiology

## 2022-12-28 DIAGNOSIS — I472 Ventricular tachycardia, unspecified: Secondary | ICD-10-CM

## 2022-12-28 HISTORY — PX: ICD IMPLANT: EP1208

## 2022-12-28 LAB — MAGNESIUM: Magnesium: 2.2 mg/dL (ref 1.7–2.4)

## 2022-12-28 LAB — BASIC METABOLIC PANEL
Anion gap: 5 (ref 5–15)
BUN: 13 mg/dL (ref 8–23)
CO2: 27 mmol/L (ref 22–32)
Calcium: 9.1 mg/dL (ref 8.9–10.3)
Chloride: 105 mmol/L (ref 98–111)
Creatinine, Ser: 0.71 mg/dL (ref 0.44–1.00)
GFR, Estimated: >60 mL/min (ref >60)
Glucose, Bld: 97 mg/dL (ref 70–99)
Potassium: 4 mmol/L (ref 3.5–5.1)
Sodium: 137 mmol/L (ref 135–145)

## 2022-12-28 LAB — SURGICAL PCR SCREEN
MRSA, PCR: NEGATIVE
Staphylococcus aureus: NEGATIVE

## 2022-12-28 SURGERY — ICD IMPLANT

## 2022-12-28 MED ORDER — MIDAZOLAM HCL 5 MG/5ML IJ SOLN
INTRAMUSCULAR | Status: DC | PRN
  Administered 2022-12-28: 2 mg via INTRAVENOUS
  Administered 2022-12-28 (×2): 1 mg via INTRAVENOUS

## 2022-12-28 MED ORDER — SODIUM CHLORIDE 0.9 % IV SOLN
80.0000 mg | INTRAVENOUS | Status: AC
  Administered 2022-12-28: 80 mg

## 2022-12-28 MED ORDER — VANCOMYCIN HCL IN DEXTROSE 1-5 GM/200ML-% IV SOLN
INTRAVENOUS | Status: AC
  Filled 2022-12-28: qty 200

## 2022-12-28 MED ORDER — SODIUM CHLORIDE 0.9 % IV SOLN
INTRAVENOUS | Status: AC
  Filled 2022-12-28: qty 2

## 2022-12-28 MED ORDER — LIDOCAINE HCL 1 % IJ SOLN
INTRAMUSCULAR | Status: AC
  Filled 2022-12-28: qty 60

## 2022-12-28 MED ORDER — CHLORHEXIDINE GLUCONATE 4 % EX LIQD: 60.0000 mL | Freq: Once | CUTANEOUS | Status: AC

## 2022-12-28 MED ORDER — SODIUM CHLORIDE 0.9 % IV SOLN: INTRAVENOUS | Status: DC

## 2022-12-28 MED ORDER — HEPARIN (PORCINE) IN NACL 1000-0.9 UT/500ML-% IV SOLN
INTRAVENOUS | Status: DC | PRN
  Administered 2022-12-28: 500 mL

## 2022-12-28 MED ORDER — VANCOMYCIN HCL IN DEXTROSE 1-5 GM/200ML-% IV SOLN
1000.0000 mg | INTRAVENOUS | Status: AC
  Administered 2022-12-28: 1000 mg via INTRAVENOUS

## 2022-12-28 MED ORDER — ONDANSETRON HCL 4 MG/2ML IJ SOLN: 4.0000 mg | Freq: Four times a day (QID) | INTRAMUSCULAR | Status: DC | PRN

## 2022-12-28 MED ORDER — CHLORHEXIDINE GLUCONATE 4 % EX LIQD
60.0000 mL | Freq: Once | CUTANEOUS | Status: AC
  Administered 2022-12-28: 4 via TOPICAL
  Filled 2022-12-28: qty 60

## 2022-12-28 MED ORDER — LIDOCAINE HCL (PF) 1 % IJ SOLN
INTRAMUSCULAR | Status: DC | PRN
  Administered 2022-12-28: 50 mL

## 2022-12-28 MED ORDER — VANCOMYCIN HCL IN DEXTROSE 1-5 GM/200ML-% IV SOLN
1000.0000 mg | INTRAVENOUS | Status: AC
  Administered 2022-12-29: 1000 mg via INTRAVENOUS
  Filled 2022-12-28: qty 200

## 2022-12-28 MED ORDER — MIDAZOLAM HCL 5 MG/5ML IJ SOLN
INTRAMUSCULAR | Status: AC
  Filled 2022-12-28: qty 5

## 2022-12-28 MED ORDER — FENTANYL CITRATE (PF) 100 MCG/2ML IJ SOLN
INTRAMUSCULAR | Status: DC | PRN
  Administered 2022-12-28: 12.5 ug via INTRAVENOUS
  Administered 2022-12-28: 25 ug via INTRAVENOUS
  Administered 2022-12-28: 12.5 ug via INTRAVENOUS

## 2022-12-28 MED ORDER — FENTANYL CITRATE (PF) 100 MCG/2ML IJ SOLN
INTRAMUSCULAR | Status: AC
  Filled 2022-12-28: qty 2

## 2022-12-28 SURGICAL SUPPLY — 8 items
CABLE SURGICAL S-101-97-12 (CABLE) ×1 IMPLANT
ICD COBALT XT DR DDPA2D1 (ICD Generator) IMPLANT
LEAD CAPSURE NOVUS 5076-52CM (Lead) IMPLANT
LEAD SPRINT QUAT SEC 6935-65CM (Lead) IMPLANT
PAD DEFIB RADIO PHYSIO CONN (PAD) ×1 IMPLANT
SHEATH 7FR PRELUDE SNAP 13 (SHEATH) IMPLANT
SHEATH 9FR PRELUDE SNAP 13 (SHEATH) IMPLANT
TRAY PACEMAKER INSERTION (PACKS) ×1 IMPLANT

## 2022-12-28 NOTE — Progress Notes (Signed)
Orthopedic Tech Progress Note Patient Details:  Patricia Alvarado Oct 05, 1946 [id number]  Patient ID: Patricia Alvarado, female   DOB: [birth year], 77 y.o.   MRN: [id number] I talked with the RN they said the patient has the arm sling on. Patricia Alvarado 12/28/2022, 8:03 PM

## 2022-12-28 NOTE — Progress Notes (Addendum)
Electrophysiology Rounding Note  Patient Name: Patricia Alvarado Date of Encounter: 12/28/2022  Primary Cardiologist: None  Electrophysiologist: Will Meredith Leeds, MD    Subjective   NAEON.  Having L visual migraine this AM  Inpatient Medications    Scheduled Meds:  escitalopram  20 mg Oral QHS   ezetimibe  10 mg Oral Daily   loratadine  10 mg Oral Daily   sodium chloride flush  3 mL Intravenous Q12H   sodium chloride flush  3 mL Intravenous Q12H   sotalol  160 mg Oral Q12H   thyroid  120 mg Oral QAC breakfast   Continuous Infusions:  sodium chloride     sodium chloride     PRN Meds: sodium chloride, sodium chloride, acetaminophen, ALPRAZolam, docusate sodium, famotidine, methocarbamol, mouth rinse, simethicone, sodium chloride flush, sodium chloride flush, traMADol   Vital Signs    Vitals:   12/27/22 0835 12/27/22 1420 12/27/22 2017 12/28/22 0500  BP: (!) 146/66 (!) 118/52 134/70 (!) 107/41  Pulse: 61 61 63 (!) 55  Resp: '16 16 20 16  '$ Temp: 98.2 F (36.8 C) 98.1 F (36.7 C) 98.9 F (37.2 C) 97.7 F (36.5 C)  TempSrc: Oral Oral Oral Oral  SpO2: 95% 97% 93% 92%  Weight:      Height:       No intake or output data in the 24 hours ending 12/28/22 0949  Filed Weights   12/23/22 0620 12/23/22 1816  Weight: 67.1 kg 67.6 kg    Physical Exam    GEN: Well nourished, well developed, in no acute distress  HEENT: normal  Neck: no JVD, carotid bruits, or masses Cardiac: RRR; no murmurs, rubs, or gallops,no edema  Respiratory:  clear to auscultation bilaterally, normal work of breathing GI: soft, nontender, nondistended, + BS MS: no deformity or atrophy  Skin: warm and dry Neuro:  Strength and sensation are intact Psych: euthymic mood, full affect   Labs    CBC No results for input(s): "WBC", "NEUTROABS", "HGB", "HCT", "MCV", "PLT" in the last 72 hours.  Basic Metabolic Panel Recent Labs    12/27/22 0316 12/28/22 0230  NA 139 137  K 4.1 4.0  CL 106  105  CO2 24 27  GLUCOSE 87 97  BUN 22 13  CREATININE 0.62 0.71  CALCIUM 9.2 9.1  MG 2.0 2.2     Telemetry    SB with PVCs, rate 50-60s (personally reviewed)  Patient Profile     Patricia Alvarado is a 77 y.o. female with a past medical history significant for persistent atrial fibrillation, PVCs  They were admitted for PVC ablation and have remained inpatient for sotalol load. While on telemetry, found to have NSVT  Assessment & Plan    #) PVCs/nonsustained VT She continues to have PVCs with sotalol, less frequent.  Cardiac MRI shows mitral annular disjunction but no LGE.   Mitral annular dysfunction and episodes of ventricular tachycardia some of which have been quite prolonged - ICD today NPO for procedure  #) Persistent atrial fibrillation:  None since admission to the hospital.   Continue to hold Eliquis for ICD implant.    For questions or updates, please contact [place] Please consult [email or web address] for contact info under Cardiology/STEMI.  Signed, [name], NP  12/28/2022, 9:49 AM    EP attending  Patient seen and examined.  Agree with the findings as noted above.  Discussed with Dr. Curt Bears and others in our group.  The patient is well-known to  me.  She is a 77 year old woman with PVCs who developed atrial fibrillation and underwent catheter ablation of her A-fib.  She then developed worsening PVCs and underwent attempted catheter ablation by Dr. Curt Bears several days ago.  She was noted to have multiple PVCs.  She was placed on sotalol and developed sustained monomorphic ventricular tachycardia without QT prolongation.  Subsequent evaluation with cardiac MRI demonstrated mitral annular disjunction, and she is now referred for ICD implantation for secondary prevention of malignant ventricular arrhythmias.  I have discussed the treatment options with the patient.  The risk, goals, benefits, and expectations of ICD insertion were reviewed and she wishes to  proceed.  Cristopher Peru, MD

## 2022-12-28 NOTE — Progress Notes (Signed)
Pharmacy Consult for Sotalol Electrolyte Replacement- Follow Up  Pharmacy consulted to assist in monitoring and replacing electrolytes in this 77 y.o. female admitted on 12/23/2022 undergoing sotalol initiation .   Labs:    Component Value Date/Time   K 4.0 12/28/2022 0230   MG 2.2 12/28/2022 0230     Plan: Potassium: K >/= 4: No additional supplementation needed  Magnesium: Mg > 2: No additional supplementation needed   Thank you for allowing pharmacy to participate in this patient's care   Hildred Laser, PharmD Clinical Pharmacist **Pharmacist phone directory can now be found on [email or web address] ([email or web address]).  Listed under [place].

## 2022-12-29 ENCOUNTER — Inpatient Hospital Stay (HOSPITAL_COMMUNITY): Payer: PPO

## 2022-12-29 ENCOUNTER — Encounter (HOSPITAL_COMMUNITY): Payer: Self-pay | Admitting: Internal Medicine

## 2022-12-29 ENCOUNTER — Other Ambulatory Visit (HOSPITAL_COMMUNITY): Payer: Self-pay

## 2022-12-29 LAB — BASIC METABOLIC PANEL
Anion gap: 9 (ref 5–15)
BUN: 13 mg/dL (ref 8–23)
CO2: 24 mmol/L (ref 22–32)
Calcium: 8.9 mg/dL (ref 8.9–10.3)
Chloride: 101 mmol/L (ref 98–111)
Creatinine, Ser: 0.72 mg/dL (ref 0.44–1.00)
GFR, Estimated: >60 mL/min (ref >60)
Glucose, Bld: 90 mg/dL (ref 70–99)
Potassium: 3.6 mmol/L (ref 3.5–5.1)
Sodium: 134 mmol/L — ABNORMAL LOW (ref 135–145)

## 2022-12-29 LAB — MAGNESIUM: Magnesium: 1.8 mg/dL (ref 1.7–2.4)

## 2022-12-29 MED ORDER — SOTALOL HCL 160 MG PO TABS
160.0000 mg | ORAL_TABLET | Freq: Two times a day (BID) | ORAL | 11 refills | Status: DC
  Filled 2022-12-29: qty 30, 15d supply, fill #0

## 2022-12-29 MED ORDER — POTASSIUM CHLORIDE CRYS ER 20 MEQ PO TBCR
60.0000 meq | EXTENDED_RELEASE_TABLET | Freq: Once | ORAL | Status: AC
  Administered 2022-12-29: 60 meq via ORAL
  Filled 2022-12-29: qty 3

## 2022-12-29 MED ORDER — MAGNESIUM SULFATE 2 GM/50ML IV SOLN
2.0000 g | Freq: Once | INTRAVENOUS | Status: AC
  Administered 2022-12-29: 2 g via INTRAVENOUS
  Filled 2022-12-29: qty 50

## 2022-12-29 MED ORDER — MAGNESIUM OXIDE 400 MG PO TABS
400.0000 mg | ORAL_TABLET | Freq: Every day | ORAL | 11 refills | Status: DC
  Filled 2022-12-29: qty 30, 30d supply, fill #0

## 2022-12-29 MED ORDER — APIXABAN 5 MG PO TABS: 5.0000 mg | ORAL_TABLET | Freq: Two times a day (BID) | ORAL | 0 refills | Status: DC

## 2022-12-29 MED ORDER — POTASSIUM CHLORIDE CRYS ER 20 MEQ PO TBCR
20.0000 meq | EXTENDED_RELEASE_TABLET | Freq: Two times a day (BID) | ORAL | 11 refills | Status: DC
  Filled 2022-12-29: qty 60, 30d supply, fill #0

## 2022-12-29 NOTE — Discharge Summary (Addendum)
ELECTROPHYSIOLOGY PROCEDURE DISCHARGE SUMMARY    Patient ID: Patricia Alvarado,  MRN: [id number], DOB/AGE: 76-Jul-1947 76 y.o.  Admit date: 12/23/2022 Discharge date: 12/29/2022  Primary Care Physician: [name], NP  Primary Cardiologist: None  Electrophysiologist: Dr. Lovena Le  and Dr. Curt Bears  Primary Diagnosis:  NSVT  Secondary Diagnosis: PVC Afib HTN   Allergies  Allergen Reactions   Quinidine Palpitations    REACTION: increased heart rate   Statins     Muscle pain   Tape     If left on long enough,will tear skin    Amoxicillin Itching and Rash   Atenolol Hives   Dofetilide Other (See Comments)    Elevated QTCs   Mexiletine Hcl Palpitations    Dizzy and extreme tiredness   Nadolol Hives   Vicodin  [Hydrocodone-Acetaminophen] Swelling    Facial redness and swelling     Procedures This Admission:  1.  Implantation of a Medtronic dual chamber ICD on 12/28/2022 by Dr. Lovena Le.  The patient received a Medtronic Cobalt XT HF CRT-D DTPA2D1 with Medtronic CapSureFix Novus [id number] right atrial lead and Medtronic Sprint Quattro Secure [id number] right ventricular lead. Pt did not require an LV lead. DFTs were deferred at time of implant There were no post procedure complications 2.  CXR on 3/5/204 demonstrated no pneumothorax status post device implantation.      Brief HPI: Patricia Alvarado is a 77 y.o. female was referred to electrophysiology in the outpatient setting  for PVC ablation with Dr. Curt Bears. During EP study, found to have multiple PVC morphologies so no ablation was required. Initially recommended propafenone or amiodarone, but patient preferred to re-trial sotalol for PVC suppression. She was re-loaded on sotalol and tolerated it well. While on telemetry for sotalol, patient had NSVT that did not start with a PVC. She underwent cMRI that showed mitral valve disjunction but no LGE. Given this new finding and NSVT, a ICD for secondary prevention was recommended. Risks,  benefits, and alternatives to ICD implantation were reviewed with the patient who wished to proceed.   Hospital Course:  The patient was admitted and underwent implantation of a Medtronic dual chamber ICD with details as outlined above. They were monitored on telemetry overnight which demonstrated appropriate pacing .  Left chest was without hematoma or ecchymosis.  The device was interrogated and found to be functioning normally.  CXR was obtained and demonstrated no pneumothorax status post device implantation..  Wound care, arm mobility, and restrictions were reviewed with the patient.  The patient was examined and considered stable for discharge to home.    Anticoagulation resumption This patient should resume their Eliquis on 01/02/2022  Physical Exam: Vitals:   12/28/22 1856 12/28/22 2012 12/29/22 0553 12/29/22 0801  BP: (!) 156/60 (!) 147/70 135/67 139/60  Pulse: 60 62 61 81  Resp:   18 18  Temp:   97.7 F (36.5 C) 98.1 F (36.7 C)  TempSrc:   Oral Oral  SpO2: 98% 99% 99% 98%  Weight:      Height:        GEN- NAD. A&O x 3.  HEENT: Normocephalic, atraumatic Lungs- CTAB, normal effort.  Heart- RRR. No M/G/R.  GI- Soft, NT, ND.  Extremities- No clubbing, cyanosis, or edema Skin- Warm and dry, no rash or lesion. ICD site dry, intact, old blood present. Outer Tegaderm removed, steri-strips to remain in place  Discharge Medications:  Allergies as of 12/29/2022       Reactions  Quinidine Palpitations   REACTION: increased heart rate   Statins    Muscle pain   Tape    If left on long enough,will tear skin    Amoxicillin Itching, Rash   Atenolol Hives   Dofetilide Other (See Comments)   Elevated QTCs   Mexiletine Hcl Palpitations   Dizzy and extreme tiredness   Nadolol Hives   Vicodin  [hydrocodone-acetaminophen] Swelling   Facial redness and swelling        Medication List     STOP taking these medications    flecainide 100 MG tablet Commonly known as:  TAMBOCOR   ibuprofen 800 MG tablet Commonly known as: ADVIL       TAKE these medications    acetaminophen 500 MG tablet Commonly known as: TYLENOL Take 1,000 mg by mouth every 6 (six) hours as needed for mild pain or moderate pain.   ALPRAZolam 0.5 MG tablet Commonly known as: XANAX Take 0.25 mg by mouth at bedtime.   apixaban 5 MG Tabs tablet Commonly known as: Eliquis Take 1 tablet (5 mg total) by mouth 2 (two) times daily. Start taking on: January 03, 2023 What changed: These instructions start on January 03, 2023. If you are unsure what to do until then, ask your doctor or other care provider.   beta carotene 10000 UNIT capsule Take 10,000 Units by mouth at bedtime.   Biotin 5 MG Caps Take 5 mg by mouth at bedtime.   Calcium 600-200 MG-UNIT tablet Take 1 tablet by mouth daily.   cetirizine 10 MG tablet Commonly known as: ZYRTEC Take 10 mg by mouth at bedtime.   CoQ-10 100 MG Caps Take 100 mg by mouth daily.   Digestive Enzyme Caps Take 1 capsule by mouth daily as needed (heartburn).   docusate sodium 250 MG capsule Commonly known as: COLACE Take 500 mg by mouth daily.   escitalopram 20 MG tablet Commonly known as: LEXAPRO Take 20 mg by mouth at bedtime. What changed: Another medication with the same name was added. Make sure you understand how and when to take each.   escitalopram 20 MG tablet Commonly known as: Lexapro Take 1 tablet (20 mg total) by mouth daily. What changed: You were already taking a medication with the same name, and this prescription was added. Make sure you understand how and when to take each.   ezetimibe 10 MG tablet Commonly known as: ZETIA Take 10 mg by mouth daily.   famotidine 20 MG tablet Commonly known as: PEPCID Take 20 mg by mouth as needed for heartburn or indigestion.   fluticasone 50 MCG/ACT nasal spray Commonly known as: FLONASE Place 1 spray into both nostrils daily as needed for allergies.   furosemide 20 MG  tablet Commonly known as: LASIX Take 20 mg by mouth daily as needed for fluid (1-2lb weight gain overnight).   hydrocortisone cream 1 % Apply 1 Application topically daily as needed for itching.   LIQUID TEARS OP Place 1 drop into both eyes daily as needed (dry eyes).   magnesium oxide 400 MG tablet Commonly known as: MAG-OX Take 1 tablet (400 mg total) by mouth daily.   MANGANESE PO Take 40 mg by mouth at bedtime.   Melatonin 10 MG Tabs Take 10 mg by mouth at bedtime.   methocarbamol 500 MG tablet Commonly known as: ROBAXIN Take 500 mg by mouth daily as needed for muscle spasms.   potassium chloride SA 20 MEQ tablet Commonly known as: KLOR-CON M Take 1  tablet (20 mEq total) by mouth 2 (two) times daily.   Repatha SureClick [id number] MG/ML So[facility] Generic drug: Evolocumab Inject 140 mg into the skin every 14 (fourteen) days.   Simethicone 125 MG Caps Take 250 mg by mouth daily as needed (bloating).   sotalol 160 MG tablet Commonly known as: BETAPACE Take 1 tablet (160 mg total) by mouth 2 (two) times daily.   SUPER B COMPLEX PO Take 1 capsule by mouth at bedtime.   thyroid 120 MG tablet Commonly known as: ARMOUR Take 120 mg by mouth daily before breakfast.   triamcinolone 0.025 % cream Commonly known as: KENALOG Apply 1 Application topically 2 (two) times daily as needed (rash).   vitamin C 1000 MG tablet Take 1,000 mg by mouth daily.   Vitamin D 125 MCG (5000 UT) Caps Take 5,000 Units by mouth daily.   Vitamin K2 100 MCG Tabs Take 100 mcg by mouth daily.   zinc gluconate 50 MG tablet Take 50 mg by mouth daily.        Disposition:    Duration of Discharge Encounter: Greater than 30 minutes including physician time.  Signed, [name], NP  12/29/2022 11:32 AM  EP Attending  Patient seen and examined. Agree with above. The patient is doing well after DDD ICD insertion. She is maintaining NSR. Her PVC's are present but better on the sotalol. Her DDD  ICD interrogation demonstrates normal device function. DC home with usual followup.  Carleene Overlie Bralyn Espino,MD

## 2022-12-29 NOTE — Plan of Care (Signed)
  Problem: Education: Goal: Understanding of disease, treatment, and recovery process will improve Outcome: Adequate for Discharge   Problem: Activity: Goal: Ability to return to baseline activity level will improve Outcome: Adequate for Discharge   Problem: Cardiac: Goal: Ability to maintain adequate cardiovascular perfusion will improve Outcome: Adequate for Discharge Goal: Vascular access site(s) Level 0-1 will be maintained Outcome: Adequate for Discharge   Problem: Health Behavior/ Discharge Planning: Goal: Ability to safely manage health related needs after discharge Outcome: Adequate for Discharge   Problem: Education: Goal: Knowledge of General Education information will improve Description: Including pain rating scale, medication(s)/side effects and non-pharmacologic comfort measures Outcome: Adequate for Discharge   Problem: Health Behavior/Discharge Planning: Goal: Ability to manage health-related needs will improve Outcome: Adequate for Discharge   Problem: Clinical Measurements: Goal: Ability to maintain clinical measurements within normal limits will improve Outcome: Adequate for Discharge Goal: Will remain free from infection Outcome: Adequate for Discharge Goal: Diagnostic test results will improve Outcome: Adequate for Discharge Goal: Respiratory complications will improve Outcome: Adequate for Discharge Goal: Cardiovascular complication will be avoided Outcome: Adequate for Discharge   Problem: Activity: Goal: Risk for activity intolerance will decrease Outcome: Adequate for Discharge   Problem: Nutrition: Goal: Adequate nutrition will be maintained Outcome: Adequate for Discharge   Problem: Coping: Goal: Level of anxiety will decrease Outcome: Adequate for Discharge   Problem: Elimination: Goal: Will not experience complications related to bowel motility Outcome: Adequate for Discharge Goal: Will not experience complications related to urinary  retention Outcome: Adequate for Discharge   Problem: Pain Managment: Goal: General experience of comfort will improve Outcome: Adequate for Discharge   Problem: Safety: Goal: Ability to remain free from injury will improve Outcome: Adequate for Discharge   Problem: Skin Integrity: Goal: Risk for impaired skin integrity will decrease Outcome: Adequate for Discharge   Problem: Education: Goal: Knowledge of cardiac device and self-care will improve Outcome: Adequate for Discharge Goal: Ability to safely manage health related needs after discharge will improve Outcome: Adequate for Discharge Goal: Individualized Educational Video(s) Outcome: Adequate for Discharge   Problem: Cardiac: Goal: Ability to achieve and maintain adequate cardiopulmonary perfusion will improve Outcome: Adequate for Discharge

## 2022-12-29 NOTE — Progress Notes (Signed)
Pharmacy Consult for Sotalol Electrolyte Replacement- Follow Up  Pharmacy consulted to assist in monitoring and replacing electrolytes in this 77 y.o. female admitted on 12/23/2022 undergoing sotalol initiation .   Labs:    Component Value Date/Time   K 3.6 12/29/2022 0230   MG 1.8 12/29/2022 0230     Plan: Potassium: K 3.5-3.7:  Give KCl 60 mEq po x1   Magnesium: Mg 1.8-2: Give Mg 2 gm IV x1    Thank you for allowing pharmacy to participate in this patient's care   Hildred Laser, PharmD Clinical Pharmacist **Pharmacist phone directory can now be found on [email or web address] ([email or web address]).  Listed under [place].

## 2022-12-29 NOTE — Care Management Important Message (Signed)
Important Message  Patient Details  Name: Marquette Rohwedder MRN: [id number] Date of Birth: 1946/06/25   Medicare Important Message Given:  Yes     Shelda Altes 12/29/2022, 10:30 AM

## 2022-12-29 NOTE — Progress Notes (Signed)
Discharge instructions (including medications) discussed with and copy provided to patient/caregiver 

## 2022-12-29 NOTE — Plan of Care (Signed)
Problem: Education: Goal: Understanding of disease, treatment, and recovery process will improve 12/29/2022 1146 by Marvis Moeller, RN Outcome: Progressing 12/29/2022 0744 by Marvis Moeller, RN Outcome: Progressing   Problem: Activity: Goal: Ability to return to baseline activity level will improve 12/29/2022 1146 by Marvis Moeller, RN Outcome: Progressing 12/29/2022 0744 by Marvis Moeller, RN Outcome: Progressing   Problem: Cardiac: Goal: Ability to maintain adequate cardiovascular perfusion will improve 12/29/2022 1146 by Marvis Moeller, RN Outcome: Progressing 12/29/2022 0744 by Marvis Moeller, RN Outcome: Progressing Goal: Vascular access site(s) Level 0-1 will be maintained 12/29/2022 1146 by Marvis Moeller, RN Outcome: Progressing 12/29/2022 0744 by Marvis Moeller, RN Outcome: Progressing   Problem: Health Behavior/ Discharge Planning: Goal: Ability to safely manage health related needs after discharge 12/29/2022 1146 by Marvis Moeller, RN Outcome: Progressing 12/29/2022 0744 by Marvis Moeller, RN Outcome: Progressing   Problem: Education: Goal: Knowledge of General Education information will improve Description: Including pain rating scale, medication(s)/side effects and non-pharmacologic comfort measures 12/29/2022 1146 by Marvis Moeller, RN Outcome: Progressing 12/29/2022 0744 by Marvis Moeller, RN Outcome: Progressing   Problem: Health Behavior/Discharge Planning: Goal: Ability to manage health-related needs will improve 12/29/2022 1146 by Marvis Moeller, RN Outcome: Progressing 12/29/2022 0744 by Marvis Moeller, RN Outcome: Progressing   Problem: Clinical Measurements: Goal: Ability to maintain clinical measurements within normal limits will improve 12/29/2022 1146 by Marvis Moeller, RN Outcome: Progressing 12/29/2022 0744 by Marvis Moeller, RN Outcome: Progressing Goal: Will remain free from infection 12/29/2022 1146  by Marvis Moeller, RN Outcome: Progressing 12/29/2022 0744 by Marvis Moeller, RN Outcome: Progressing Goal: Diagnostic test results will improve 12/29/2022 1146 by Marvis Moeller, RN Outcome: Progressing 12/29/2022 0744 by Marvis Moeller, RN Outcome: Progressing Goal: Respiratory complications will improve 12/29/2022 1146 by Marvis Moeller, RN Outcome: Progressing 12/29/2022 0744 by Marvis Moeller, RN Outcome: Progressing Goal: Cardiovascular complication will be avoided 12/29/2022 1146 by Marvis Moeller, RN Outcome: Progressing 12/29/2022 0744 by Marvis Moeller, RN Outcome: Progressing   Problem: Activity: Goal: Risk for activity intolerance will decrease 12/29/2022 1146 by Marvis Moeller, RN Outcome: Progressing 12/29/2022 0744 by Marvis Moeller, RN Outcome: Progressing   Problem: Nutrition: Goal: Adequate nutrition will be maintained 12/29/2022 1146 by Marvis Moeller, RN Outcome: Progressing 12/29/2022 0744 by Marvis Moeller, RN Outcome: Progressing   Problem: Coping: Goal: Level of anxiety will decrease 12/29/2022 1146 by Marvis Moeller, RN Outcome: Progressing 12/29/2022 0744 by Marvis Moeller, RN Outcome: Progressing   Problem: Elimination: Goal: Will not experience complications related to bowel motility 12/29/2022 1146 by Marvis Moeller, RN Outcome: Progressing 12/29/2022 0744 by Marvis Moeller, RN Outcome: Progressing Goal: Will not experience complications related to urinary retention 12/29/2022 1146 by Marvis Moeller, RN Outcome: Progressing 12/29/2022 0744 by Marvis Moeller, RN Outcome: Progressing   Problem: Pain Managment: Goal: General experience of comfort will improve 12/29/2022 1146 by Marvis Moeller, RN Outcome: Progressing 12/29/2022 0744 by Marvis Moeller, RN Outcome: Progressing   Problem: Safety: Goal: Ability to remain free from injury will improve 12/29/2022 1146 by Marvis Moeller,  RN Outcome: Progressing 12/29/2022 0744 by Marvis Moeller, RN Outcome: Progressing   Problem: Skin Integrity: Goal: Risk for impaired skin integrity will decrease 12/29/2022 1146 by Marvis Moeller, RN Outcome: Progressing 12/29/2022 0744 by Marvis Moeller, RN Outcome: Progressing   Problem: Education: Goal: Knowledge of cardiac device and self-care will improve 12/29/2022 1146 by Marvis Moeller, RN Outcome: Progressing 12/29/2022 0744 by Marvis Moeller, RN Outcome: Progressing Goal: Ability to safely manage health related needs after discharge will improve 12/29/2022 1146 by Marvis Moeller,  RN Outcome: Progressing 12/29/2022 0744 by Marvis Moeller, RN Outcome: Progressing Goal: Individualized Educational Video(s) 12/29/2022 1146 by Marvis Moeller, RN Outcome: Progressing 12/29/2022 0744 by Marvis Moeller, RN Outcome: Progressing   Problem: Cardiac: Goal: Ability to achieve and maintain adequate cardiopulmonary perfusion will improve 12/29/2022 1146 by Marvis Moeller, RN Outcome: Progressing 12/29/2022 0744 by Marvis Moeller, RN Outcome: Progressing

## 2022-12-29 NOTE — Plan of Care (Signed)
  Problem: Education: Goal: Understanding of disease, treatment, and recovery process will improve Outcome: Progressing   Problem: Activity: Goal: Ability to return to baseline activity level will improve Outcome: Progressing   Problem: Cardiac: Goal: Ability to maintain adequate cardiovascular perfusion will improve Outcome: Progressing Goal: Vascular access site(s) Level 0-1 will be maintained Outcome: Progressing   Problem: Health Behavior/ Discharge Planning: Goal: Ability to safely manage health related needs after discharge Outcome: Progressing   Problem: Education: Goal: Knowledge of General Education information will improve Description: Including pain rating scale, medication(s)/side effects and non-pharmacologic comfort measures Outcome: Progressing   Problem: Health Behavior/Discharge Planning: Goal: Ability to manage health-related needs will improve Outcome: Progressing   Problem: Clinical Measurements: Goal: Ability to maintain clinical measurements within normal limits will improve Outcome: Progressing Goal: Will remain free from infection Outcome: Progressing Goal: Diagnostic test results will improve Outcome: Progressing Goal: Respiratory complications will improve Outcome: Progressing Goal: Cardiovascular complication will be avoided Outcome: Progressing   Problem: Activity: Goal: Risk for activity intolerance will decrease Outcome: Progressing   Problem: Nutrition: Goal: Adequate nutrition will be maintained Outcome: Progressing   Problem: Coping: Goal: Level of anxiety will decrease Outcome: Progressing   Problem: Elimination: Goal: Will not experience complications related to bowel motility Outcome: Progressing Goal: Will not experience complications related to urinary retention Outcome: Progressing   Problem: Pain Managment: Goal: General experience of comfort will improve Outcome: Progressing   Problem: Safety: Goal: Ability to  remain free from injury will improve Outcome: Progressing   Problem: Skin Integrity: Goal: Risk for impaired skin integrity will decrease Outcome: Progressing   Problem: Education: Goal: Knowledge of cardiac device and self-care will improve Outcome: Progressing Goal: Ability to safely manage health related needs after discharge will improve Outcome: Progressing Goal: Individualized Educational Video(s) Outcome: Progressing   Problem: Cardiac: Goal: Ability to achieve and maintain adequate cardiopulmonary perfusion will improve Outcome: Progressing

## 2023-01-01 ENCOUNTER — Ambulatory Visit: Payer: PPO | Admitting: Student

## 2023-01-02 ENCOUNTER — Other Ambulatory Visit (HOSPITAL_COMMUNITY): Payer: Self-pay

## 2023-01-02 MED ORDER — METHOCARBAMOL 500 MG PO TABS
500.0000 mg | ORAL_TABLET | Freq: Three times a day (TID) | ORAL | 1 refills | Status: DC | PRN
  Filled 2023-01-02: qty 30, 10d supply, fill #0
  Filled 2023-03-19: qty 30, 10d supply, fill #1

## 2023-01-06 ENCOUNTER — Encounter: Payer: Self-pay | Admitting: Cardiology

## 2023-01-06 ENCOUNTER — Other Ambulatory Visit (HOSPITAL_COMMUNITY): Payer: Self-pay

## 2023-01-06 MED ORDER — SOTALOL HCL 160 MG PO TABS
160.0000 mg | ORAL_TABLET | Freq: Two times a day (BID) | ORAL | 3 refills | Status: DC
  Filled 2023-01-06: qty 90, 45d supply, fill #0

## 2023-01-11 ENCOUNTER — Telehealth: Payer: Self-pay | Admitting: Cardiology

## 2023-01-11 ENCOUNTER — Other Ambulatory Visit: Payer: Self-pay | Admitting: Cardiology

## 2023-01-11 ENCOUNTER — Encounter: Payer: Self-pay | Admitting: Cardiology

## 2023-01-11 ENCOUNTER — Other Ambulatory Visit (HOSPITAL_COMMUNITY): Payer: Self-pay

## 2023-01-11 MED ORDER — SOTALOL HCL 160 MG PO TABS
160.0000 mg | ORAL_TABLET | Freq: Two times a day (BID) | ORAL | 3 refills | Status: DC
  Filled 2023-01-11: qty 180, 90d supply, fill #0

## 2023-01-11 NOTE — Telephone Encounter (Signed)
Pt c/o medication issue:  1. Name of Medication: sotalol (BETAPACE) 160 MG tablet   2. How are you currently taking this medication (dosage and times per day)? As written  3. Are you having a reaction (difficulty breathing--STAT)? No   4. What is your medication issue? Patient needs a 90 day supply sent to  [place]

## 2023-01-11 NOTE — Telephone Encounter (Signed)
Pt's medication was sent to pt's pharmacy as requested. Confirmation received.  °

## 2023-01-12 ENCOUNTER — Telehealth: Payer: Self-pay | Admitting: Cardiology

## 2023-01-12 ENCOUNTER — Other Ambulatory Visit: Payer: Self-pay

## 2023-01-12 ENCOUNTER — Ambulatory Visit: Payer: PPO

## 2023-01-12 ENCOUNTER — Other Ambulatory Visit (HOSPITAL_COMMUNITY): Payer: Self-pay

## 2023-01-12 DIAGNOSIS — I10 Essential (primary) hypertension: Secondary | ICD-10-CM | POA: Diagnosis not present

## 2023-01-12 DIAGNOSIS — R197 Diarrhea, unspecified: Secondary | ICD-10-CM | POA: Diagnosis not present

## 2023-01-12 DIAGNOSIS — I493 Ventricular premature depolarization: Secondary | ICD-10-CM | POA: Diagnosis not present

## 2023-01-12 MED ORDER — SOTALOL HCL 160 MG PO TABS
160.0000 mg | ORAL_TABLET | Freq: Two times a day (BID) | ORAL | 2 refills | Status: DC
  Filled 2023-01-12: qty 180, 90d supply, fill #0
  Filled 2023-04-06: qty 180, 90d supply, fill #1
  Filled 2023-07-05: qty 180, 90d supply, fill #2

## 2023-01-12 NOTE — Telephone Encounter (Signed)
  Pt said, she's been having stomach ache since she got home from the hospital, she is afraid she might have c-dif. She would like to speak with a nurse to discuss. She said to call her at [phone]

## 2023-01-12 NOTE — Telephone Encounter (Signed)
Pt reports "bowel problems since I came home". Pt post ICD implant 3/4. She reports that she thinks it could be secondary to the Vanc they gave her at the hospital. Further describes it started some while still at the hospital but has worsened since. BM has "increased to diarrhea" and "very foul smell" .  Pt advised to contact PCP to be seen/tested today. Pt agreeable to plan.

## 2023-01-13 ENCOUNTER — Other Ambulatory Visit (HOSPITAL_COMMUNITY): Payer: Self-pay

## 2023-01-13 DIAGNOSIS — R197 Diarrhea, unspecified: Secondary | ICD-10-CM | POA: Diagnosis not present

## 2023-01-14 ENCOUNTER — Ambulatory Visit: Payer: PPO | Attending: Cardiology

## 2023-01-14 DIAGNOSIS — I4729 Other ventricular tachycardia: Secondary | ICD-10-CM

## 2023-01-14 NOTE — Patient Instructions (Signed)

## 2023-01-18 ENCOUNTER — Other Ambulatory Visit (HOSPITAL_COMMUNITY): Payer: Self-pay

## 2023-01-18 ENCOUNTER — Other Ambulatory Visit: Payer: Self-pay

## 2023-01-19 DIAGNOSIS — R945 Abnormal results of liver function studies: Secondary | ICD-10-CM | POA: Diagnosis not present

## 2023-01-19 LAB — CUP PACEART INCLINIC DEVICE CHECK
Date Time Interrogation Session: 20240321135356
Implantable Lead Connection Status: 753985
Implantable Lead Connection Status: 753985
Implantable Lead Implant Date: 20240304
Implantable Lead Implant Date: 20240304
Implantable Lead Location: 753859
Implantable Lead Location: 753860
Implantable Lead Model: 5076
Implantable Pulse Generator Implant Date: 20240304

## 2023-01-19 NOTE — Progress Notes (Signed)

## 2023-01-22 NOTE — Progress Notes (Unsigned)
Electrophysiology Office Note:   Date:  01/25/2023  ID:  Patricia Alvarado, DOB 05-05-1946, MRN [id number]  Primary Cardiologist: None Electrophysiologist: Cristopher Peru, MD -> Dr. Curt Bears saw for AF and PVC ablations  History of Present Illness:   Patricia Alvarado is a 77 y.o. female with h/o HTN, PVCs, NSVT, and AF s/p ablation 08/2021 seen today for post hospital follow up.    Admitted 2/28 for planned PVC ablation which were multifocal and not able to be ablated. Pt was admitted and started on Sotalol.  She had VT that was not triggered by a PVC, so felt safe to continue, but also indicated for ICD, which she underwent on 12/28/22 by Dr. Lovena Le  Since discharge from hospital the patient reports doing OK. She has not felt her usually palpitations, but has had a new sensation described as a rapid "ticking" sensation, similar to "what a baby kicking in your belly" feels like. She has also had intermittent diarrhea and loose stools, that have improved but remains "soft and mushy". She has only intermittently taking her potassium as she felt it was contributing to her stomach upset.  She verbalizes understanding the importance of appropriate electrolyte management on Sotalol.   Review of systems complete and found to be negative unless listed in HPI.   Device History: Medtronic Dual Chamber ICD implanted 12/28/2022 for Dr. Lovena Le History of AAD therapy: Yes; currently on sotalol    Studies Reviewed:    ICD Interrogation-  reviewed in detail today,  See PACEART report.  EKG is ordered today. Personal review shows NSR at 76 bpm with likely PAC  PVC Ablation 12/23/22 1. Sinus rhythm upon presentation.   2.  Suprasternal infusion with multiple PVCs noted 3.  No ablation performed 4. No early apparent complications.  cMRI 12/25/2022 [place] Annular disjunction noted. Cannot exclude small amount of LGE at the area of posterior disjunction. There is no papillary muscle VT. Based on study suspect posterior  focus of ventricular tachycardia.   Mild to moderate mitral regurgitation.  Physical Exam:   VS:  BP 118/64   Pulse 76   Ht 5' 4.5" (1.638 m)   Wt 149 lb 3.2 oz (67.7 kg)   SpO2 97%   BMI 25.21 kg/m    Wt Readings from Last 3 Encounters:  01/25/23 149 lb 3.2 oz (67.7 kg)  12/23/22 149 lb (67.6 kg)  11/02/22 150 lb 6.4 oz (68.2 kg)     GEN: Well nourished, well developed in no acute distress NECK: No JVD; No carotid bruits CARDIAC: Regular rate and rhythm, no murmurs, rubs, gallops RESPIRATORY:  Clear to auscultation without rales, wheezing or rhonchi  ABDOMEN: Soft, non-tender, non-distended EXTREMITIES:  No edema; No deformity   ASSESSMENT AND PLAN:    VT s/p Medtronic dual chamber ICD  euvolemic today Stable on an appropriate medical regimen Normal ICD function See Pace Art report No changes today.  No episodes on device today.   Paroxysmal atrial fibrillation EKG today shows NSR with stable QT Continue sotalol 160 mg BID  HTN Stable on current regimen   Palpitations PVCs Pt describes a new rapid "ticking" sensation from time to time, different than her prior palpitations and new since device.  Not reproducible with testing today. Follow.  Pt had multifocal PVCs (at least 5) that were not amenable to ablation.  I will not change medications for now.  Labwork today.    Device does not show any NSVT or PMT.   Disposition:  Follow up with Dr. Lovena Le as scheduled   Signed, Shirley Friar, PA-C

## 2023-01-25 ENCOUNTER — Ambulatory Visit: Payer: PPO | Attending: Student | Admitting: Student

## 2023-01-25 ENCOUNTER — Ambulatory Visit: Payer: PPO | Admitting: Cardiology

## 2023-01-25 ENCOUNTER — Encounter: Payer: Self-pay | Admitting: Student

## 2023-01-25 VITALS — BP 118/64 | HR 76 | Ht 64.5 in | Wt 149.2 lb

## 2023-01-25 DIAGNOSIS — I10 Essential (primary) hypertension: Secondary | ICD-10-CM | POA: Diagnosis not present

## 2023-01-25 DIAGNOSIS — I4729 Other ventricular tachycardia: Secondary | ICD-10-CM | POA: Diagnosis not present

## 2023-01-25 DIAGNOSIS — I4819 Other persistent atrial fibrillation: Secondary | ICD-10-CM

## 2023-01-25 DIAGNOSIS — I493 Ventricular premature depolarization: Secondary | ICD-10-CM | POA: Diagnosis not present

## 2023-01-25 LAB — CUP PACEART INCLINIC DEVICE CHECK
Battery Remaining Longevity: 120 mo
Battery Voltage: 3.13 V
Brady Statistic AP VP Percent: 0.19 %
Brady Statistic AP VS Percent: 53.16 %
Brady Statistic AS VP Percent: 0.15 %
Brady Statistic AS VS Percent: 46.5 %
Brady Statistic RA Percent Paced: 56.4 %
Brady Statistic RV Percent Paced: 0.34 %
Date Time Interrogation Session: 20240401122045
HighPow Impedance: 60 Ohm
Implantable Lead Connection Status: 753985
Implantable Lead Connection Status: 753985
Implantable Lead Implant Date: 20240304
Implantable Lead Implant Date: 20240304
Implantable Lead Location: 753859
Implantable Lead Location: 753860
Implantable Lead Model: 5076
Implantable Pulse Generator Implant Date: 20240304
Lead Channel Impedance Value: 266 Ohm
Lead Channel Impedance Value: 342 Ohm
Lead Channel Impedance Value: 418 Ohm
Lead Channel Pacing Threshold Amplitude: 0.875 V
Lead Channel Pacing Threshold Amplitude: 1 V
Lead Channel Pacing Threshold Amplitude: 1.25 V
Lead Channel Pacing Threshold Amplitude: 1.25 V
Lead Channel Pacing Threshold Pulse Width: 0.4 ms
Lead Channel Pacing Threshold Pulse Width: 0.4 ms
Lead Channel Pacing Threshold Pulse Width: 0.4 ms
Lead Channel Pacing Threshold Pulse Width: 0.4 ms
Lead Channel Sensing Intrinsic Amplitude: 13.8 mV
Lead Channel Sensing Intrinsic Amplitude: 2.4 mV
Lead Channel Setting Pacing Amplitude: 3.5 V
Lead Channel Setting Pacing Amplitude: 3.5 V
Lead Channel Setting Pacing Pulse Width: 0.4 ms
Lead Channel Setting Sensing Sensitivity: 0.3 mV
Zone Setting Status: 755011

## 2023-01-25 NOTE — Patient Instructions (Signed)
Medication Instructions:  Your physician recommends that you continue on your current medications as directed. Please refer to the Current Medication list given to you today.  *If you need a refill on your cardiac medications before your next appointment, please call your pharmacy*  Lab Work: BMET, MAG, CBC--TODAY If you have labs (blood work) drawn today and your tests are completely normal, you will receive your results only by: [place] (if you have MyChart) OR A paper copy in the mail If you have any lab test that is abnormal or we need to change your treatment, we will call you to review the results.  Follow-Up: At [facility], you and your health needs are our priority.  As part of our continuing mission to provide you with exceptional heart care, we have created designated Provider Care Teams.  These Care Teams include your primary Cardiologist (physician) and Advanced Practice Providers (APPs -  Physician Assistants and Nurse Practitioners) who all work together to provide you with the care you need, when you need it.  Your next appointment:   04/06/2023 at 2:15 PM  Provider:   Cristopher Peru, MD

## 2023-01-26 LAB — BASIC METABOLIC PANEL
BUN/Creatinine Ratio: 28 (ref 12–28)
BUN: 17 mg/dL (ref 8–27)
CO2: 26 mmol/L (ref 20–29)
Calcium: 9.4 mg/dL (ref 8.7–10.3)
Chloride: 105 mmol/L (ref 96–106)
Creatinine, Ser: 0.61 mg/dL (ref 0.57–1.00)
Glucose: 88 mg/dL (ref 70–99)
Potassium: 4.2 mmol/L (ref 3.5–5.2)
Sodium: 143 mmol/L (ref 134–144)
eGFR: 93 mL/min/{1.73_m2} (ref >59)

## 2023-01-26 LAB — CBC
Hematocrit: 38.4 % (ref 34.0–46.6)
Hemoglobin: 12.7 g/dL (ref 11.1–15.9)
MCH: 31.3 pg (ref 26.6–33.0)
MCHC: 33.1 g/dL (ref 31.5–35.7)
MCV: 95 fL (ref 79–97)
Platelets: 170 10*3/uL (ref 150–450)
RBC: 4.06 x10E6/uL (ref 3.77–5.28)
RDW: 12.4 % (ref 11.7–15.4)
WBC: 5.4 10*3/uL (ref 3.4–10.8)

## 2023-01-26 LAB — MAGNESIUM: Magnesium: 2 mg/dL (ref 1.6–2.3)

## 2023-02-03 DIAGNOSIS — Z170 Estrogen receptor positive status [ER+]: Secondary | ICD-10-CM | POA: Diagnosis not present

## 2023-02-03 DIAGNOSIS — C50111 Malignant neoplasm of central portion of right female breast: Secondary | ICD-10-CM | POA: Diagnosis not present

## 2023-02-16 ENCOUNTER — Other Ambulatory Visit: Payer: Self-pay | Admitting: *Deleted

## 2023-02-16 DIAGNOSIS — I4729 Other ventricular tachycardia: Secondary | ICD-10-CM

## 2023-02-16 MED ORDER — APIXABAN 5 MG PO TABS: 5.0000 mg | ORAL_TABLET | Freq: Two times a day (BID) | ORAL | 1 refills | Status: DC

## 2023-02-16 NOTE — Telephone Encounter (Signed)
From: [name] To: Office of Sherie Don, [place] Sent: 02/15/2023 4:44 PM EDT Subject: Medication Renewal Request  Refills have been requested for the following medications:   apixaban (ELIQUIS) 5 MG TABS tablet [Suzann Riddle]  Preferred pharmacy: [facility] Delivery method: Mail

## 2023-02-16 NOTE — Telephone Encounter (Signed)
Prescription refill request for Eliquis received. Indication: PAF Last office visit: 01/25/23  Polly Cobia PA-C Scr: 0.61 on 01/25/23  Epic Age: 77 Weight: 67.7kg  Based on above findings Eliquis  twice daily is the appropriate dose.  Refill approved.

## 2023-02-19 ENCOUNTER — Encounter (HOSPITAL_COMMUNITY): Payer: Self-pay

## 2023-02-19 ENCOUNTER — Other Ambulatory Visit: Payer: Self-pay | Admitting: Internal Medicine

## 2023-02-19 ENCOUNTER — Other Ambulatory Visit (HOSPITAL_COMMUNITY): Payer: Self-pay

## 2023-02-19 ENCOUNTER — Telehealth: Payer: Self-pay | Admitting: Internal Medicine

## 2023-02-19 DIAGNOSIS — I480 Paroxysmal atrial fibrillation: Secondary | ICD-10-CM

## 2023-02-19 DIAGNOSIS — I4729 Other ventricular tachycardia: Secondary | ICD-10-CM

## 2023-02-19 MED ORDER — APIXABAN 5 MG PO TABS
5.0000 mg | ORAL_TABLET | Freq: Two times a day (BID) | ORAL | 1 refills | Status: DC
  Filled 2023-02-19: qty 60, 30d supply, fill #0
  Filled 2023-03-19: qty 60, 30d supply, fill #1
  Filled 2023-05-15: qty 60, 30d supply, fill #2
  Filled 2023-06-11: qty 60, 30d supply, fill #3
  Filled 2023-07-05: qty 60, 30d supply, fill #4
  Filled 2023-08-10: qty 60, 30d supply, fill #5

## 2023-02-19 MED ORDER — APIXABAN 5 MG PO TABS
5.0000 mg | ORAL_TABLET | Freq: Two times a day (BID) | ORAL | 1 refills | Status: DC
  Filled 2023-02-19: qty 180, 90d supply, fill #0

## 2023-02-19 NOTE — Telephone Encounter (Signed)
Eliquis 5mg  refill request received. Patient is 77 years old, weight-67.7kg, Crea-0.61 on 01/25/23, Diagnosis-Afib, and last seen by Otilio Saber on 01/25/23. Dose is appropriate based on dosing criteria. Will send in refill to requested pharmacy.    Refill was set to print by another user therefore unsure where the refill went.

## 2023-02-19 NOTE — Telephone Encounter (Signed)
New rx sent in

## 2023-02-19 NOTE — Telephone Encounter (Signed)
Pt c/o medication issue:  1. Name of Medication: apixaban (ELIQUIS) 5 MG TABS tablet   2. How are you currently taking this medication (dosage and times per day)?   3. Are you having a reaction (difficulty breathing--STAT)?   4. What is your medication issue? Patient is requesting this medication sent to: [place] [facility]   She states it was never sent and the pharmacy does not have.

## 2023-02-20 ENCOUNTER — Other Ambulatory Visit (HOSPITAL_COMMUNITY): Payer: Self-pay

## 2023-02-20 ENCOUNTER — Encounter (HOSPITAL_COMMUNITY): Payer: Self-pay

## 2023-02-21 ENCOUNTER — Other Ambulatory Visit (HOSPITAL_COMMUNITY): Payer: Self-pay

## 2023-02-23 DIAGNOSIS — E785 Hyperlipidemia, unspecified: Secondary | ICD-10-CM | POA: Diagnosis not present

## 2023-02-23 DIAGNOSIS — E039 Hypothyroidism, unspecified: Secondary | ICD-10-CM | POA: Diagnosis not present

## 2023-02-23 DIAGNOSIS — K5909 Other constipation: Secondary | ICD-10-CM | POA: Diagnosis not present

## 2023-02-23 DIAGNOSIS — F330 Major depressive disorder, recurrent, mild: Secondary | ICD-10-CM | POA: Diagnosis not present

## 2023-02-24 DIAGNOSIS — L820 Inflamed seborrheic keratosis: Secondary | ICD-10-CM | POA: Diagnosis not present

## 2023-02-24 DIAGNOSIS — L821 Other seborrheic keratosis: Secondary | ICD-10-CM | POA: Diagnosis not present

## 2023-02-24 DIAGNOSIS — L814 Other melanin hyperpigmentation: Secondary | ICD-10-CM | POA: Diagnosis not present

## 2023-02-24 DIAGNOSIS — D2239 Melanocytic nevi of other parts of face: Secondary | ICD-10-CM | POA: Diagnosis not present

## 2023-02-24 DIAGNOSIS — D225 Melanocytic nevi of trunk: Secondary | ICD-10-CM | POA: Diagnosis not present

## 2023-03-05 ENCOUNTER — Other Ambulatory Visit (HOSPITAL_COMMUNITY): Payer: Self-pay

## 2023-03-10 DIAGNOSIS — H26493 Other secondary cataract, bilateral: Secondary | ICD-10-CM | POA: Diagnosis not present

## 2023-03-10 DIAGNOSIS — H524 Presbyopia: Secondary | ICD-10-CM | POA: Diagnosis not present

## 2023-03-19 ENCOUNTER — Other Ambulatory Visit: Payer: Self-pay

## 2023-03-30 ENCOUNTER — Ambulatory Visit (INDEPENDENT_AMBULATORY_CARE_PROVIDER_SITE_OTHER): Payer: PPO

## 2023-03-30 DIAGNOSIS — I4729 Other ventricular tachycardia: Secondary | ICD-10-CM

## 2023-03-30 LAB — CUP PACEART REMOTE DEVICE CHECK
Battery Remaining Longevity: 144 mo
Battery Voltage: 3.09 V
Brady Statistic AP VP Percent: 0.14 %
Brady Statistic AP VS Percent: 40.24 %
Brady Statistic AS VP Percent: 0.08 %
Brady Statistic AS VS Percent: 59.54 %
Brady Statistic RA Percent Paced: 43.09 %
Brady Statistic RV Percent Paced: 0.22 %
Date Time Interrogation Session: 20240603201317
HighPow Impedance: 65 Ohm
Implantable Lead Connection Status: 753985
Implantable Lead Connection Status: 753985
Implantable Lead Implant Date: 20240304
Implantable Lead Implant Date: 20240304
Implantable Lead Location: 753859
Implantable Lead Location: 753860
Implantable Lead Model: 5076
Implantable Pulse Generator Implant Date: 20240304
Lead Channel Impedance Value: 285 Ohm
Lead Channel Impedance Value: 361 Ohm
Lead Channel Impedance Value: 456 Ohm
Lead Channel Pacing Threshold Amplitude: 0.875 V
Lead Channel Pacing Threshold Amplitude: 0.875 V
Lead Channel Pacing Threshold Pulse Width: 0.4 ms
Lead Channel Pacing Threshold Pulse Width: 0.4 ms
Lead Channel Sensing Intrinsic Amplitude: 1.5 mV
Lead Channel Sensing Intrinsic Amplitude: 10.5 mV
Lead Channel Setting Pacing Amplitude: 1.5 V
Lead Channel Setting Pacing Amplitude: 2 V
Lead Channel Setting Pacing Pulse Width: 0.4 ms
Lead Channel Setting Sensing Sensitivity: 0.3 mV
Zone Setting Status: 755011

## 2023-04-06 ENCOUNTER — Ambulatory Visit: Payer: PPO | Attending: Internal Medicine | Admitting: Internal Medicine

## 2023-04-06 ENCOUNTER — Other Ambulatory Visit (HOSPITAL_COMMUNITY): Payer: Self-pay

## 2023-04-06 ENCOUNTER — Encounter: Payer: Self-pay | Admitting: Internal Medicine

## 2023-04-06 VITALS — BP 130/68 | HR 72 | Ht 64.5 in | Wt 149.0 lb

## 2023-04-06 DIAGNOSIS — Z95810 Presence of automatic (implantable) cardiac defibrillator: Secondary | ICD-10-CM

## 2023-04-06 DIAGNOSIS — I10 Essential (primary) hypertension: Secondary | ICD-10-CM | POA: Diagnosis not present

## 2023-04-06 DIAGNOSIS — I480 Paroxysmal atrial fibrillation: Secondary | ICD-10-CM

## 2023-04-06 DIAGNOSIS — I4729 Other ventricular tachycardia: Secondary | ICD-10-CM

## 2023-04-06 LAB — CUP PACEART INCLINIC DEVICE CHECK
Date Time Interrogation Session: 20240611163854
Implantable Lead Connection Status: 753985
Implantable Lead Connection Status: 753985
Implantable Lead Implant Date: 20240304
Implantable Lead Implant Date: 20240304
Implantable Lead Location: 753859
Implantable Lead Location: 753860
Implantable Lead Model: 5076
Implantable Pulse Generator Implant Date: 20240304

## 2023-04-06 NOTE — Progress Notes (Signed)
HPI Mrs. Cherian returns today for followup. She is a pleasant 77 yo woman with  H/o NSVT, HTN, and PVC's. She has developed recurrent breast CA in the interim. She also had atrial fib. She was diagnosed with mitral annular dysjunction and prolonged VT. She underwent ICD insertion about 3 months ago. In the interim she notes she feels some resolving discomfort at her incision.  Allergies  Allergen Reactions   Quinidine Palpitations    REACTION: increased heart rate   Statins     Muscle pain   Tape     If left on long enough,will tear skin    Amoxicillin Itching and Rash   Atenolol Hives   Dofetilide Other (See Comments)    Elevated QTCs   Mexiletine Hcl Palpitations    Dizzy and extreme tiredness   Nadolol Hives   Vicodin  [Hydrocodone-Acetaminophen] Swelling    Facial redness and swelling     Current Outpatient Medications  Medication Sig Dispense Refill   acetaminophen (TYLENOL) 500 MG tablet Take 1,000 mg by mouth every 6 (six) hours as needed for mild pain or moderate pain.     ALPRAZolam (XANAX) 0.5 MG tablet Take 0.25 mg by mouth at bedtime.     apixaban (ELIQUIS) 5 MG TABS tablet Take 1 tablet (5 mg total) by mouth 2 (two) times daily. 180 tablet 1   Ascorbic Acid (VITAMIN C) 1000 MG tablet Take 1,000 mg by mouth daily.     B Complex-C (SUPER B COMPLEX PO) Take 1 capsule by mouth at bedtime.     beta carotene 84696 UNIT capsule Take 10,000 Units by mouth at bedtime.     Biotin 5 MG CAPS Take 5 mg by mouth at bedtime.      Calcium 600-200 MG-UNIT tablet Take 1 tablet by mouth daily.     cetirizine (ZYRTEC) 10 MG tablet Take 10 mg by mouth at bedtime.     Cholecalciferol (VITAMIN D) 125 MCG (5000 UT) CAPS Take 5,000 Units by mouth daily.     Coenzyme Q10 (COQ-10) 100 MG CAPS Take 100 mg by mouth daily.     Digestive Enzyme CAPS Take 1 capsule by mouth daily as needed (heartburn).     docusate sodium (COLACE) 250 MG capsule Take 500 mg by mouth daily.      escitalopram (LEXAPRO) 20 MG tablet Take 20 mg by mouth at bedtime.     escitalopram (LEXAPRO) 20 MG tablet Take 1 tablet (20 mg total) by mouth daily. 90 tablet 4   Evolocumab (REPATHA SURECLICK) 140 MG/ML SOAJ Inject 140 mg into the skin every 14 (fourteen) days. 6 mL 3   ezetimibe (ZETIA) 10 MG tablet Take 10 mg by mouth daily.     famotidine (PEPCID) 20 MG tablet Take 20 mg by mouth as needed for heartburn or indigestion.     fluticasone (FLONASE) 50 MCG/ACT nasal spray Place 1 spray into both nostrils daily as needed for allergies.     furosemide (LASIX) 20 MG tablet Take 20 mg by mouth daily as needed for fluid (1-2lb weight gain overnight).     hydrocortisone cream 1 % Apply 1 Application topically daily as needed for itching.     magnesium oxide (MAG-OX) 400 MG tablet Take 1 tablet (400 mg total) by mouth daily. 30 tablet 11   MANGANESE PO Take 40 mg by mouth at bedtime.     Melatonin 10 MG TABS Take 10 mg by mouth at bedtime.  Menatetrenone (VITAMIN K2) 100 MCG TABS Take 100 mcg by mouth daily.     methocarbamol (ROBAXIN) 500 MG tablet Take 500 mg by mouth daily as needed for muscle spasms.     methocarbamol (ROBAXIN) 500 MG tablet Take 1 tablet (500 mg total) by mouth every 8 (eight) hours as needed for neck pain 30 tablet 1   Polyvinyl Alcohol (LIQUID TEARS OP) Place 1 drop into both eyes daily as needed (dry eyes).     potassium chloride SA (KLOR-CON M) 20 MEQ tablet Take 1 tablet (20 mEq total) by mouth 2 (two) times daily. 60 tablet 11   Simethicone 125 MG CAPS Take 250 mg by mouth daily as needed (bloating).     sotalol (BETAPACE) 160 MG tablet Take 1 tablet (160 mg total) by mouth 2 (two) times daily. 180 tablet 2   thyroid (ARMOUR) 120 MG tablet Take 120 mg by mouth daily before breakfast.     triamcinolone (KENALOG) 0.025 % cream Apply 1 Application topically 2 (two) times daily as needed (rash).     zinc gluconate 50 MG tablet Take 50 mg by mouth daily.     No current  facility-administered medications for this visit.     Past Medical History:  Diagnosis Date   A-fib [facility])    Arthritis    thumb   Basal cell carcinoma 2001   "forehead, between eyebrows" right arm (2022)   Breast cancer (HCC)    Carcinoma of thyroid gland (HCC) 2001   Chronic lower back pain    "worse is across my hips" (05/26/2016)   Depression    Dyslipidemia    Dyspnea    Family history of breast cancer    Family history of kidney cancer    Family history of leukemia    Family history of nonmelanoma skin cancer    Fibrocystic breast    Fibromyalgia    GERD (gastroesophageal reflux disease)    Heart murmur    History of blood transfusion 1992   "after subcutaneous mastectomies"   Hypertension    not on any medications   Hypothyroidism    Malignant melanoma of left ankle (HCC) 2001   Migraine    "visual; 2-3 times/year" (05/26/2016)   Mitral valve prolapse    PONV (postoperative nausea and vomiting)    Ventricular tachycardia (HCC)    Hx of, controlled on sotalol therapy    ROS:   All systems reviewed and negative except as noted in the HPI.   Past Surgical History:  Procedure Laterality Date   ABDOMINAL HYSTERECTOMY  1998   ANTERIOR CERVICAL DECOMP/DISCECTOMY FUSION  2001   ATRIAL FIBRILLATION ABLATION N/A 09/12/2021   Procedure: ATRIAL FIBRILLATION ABLATION;  Surgeon: Regan Lemming, MD;  Location: [facility];  Service: Cardiovascular;  Laterality: N/A;   BACK SURGERY     BASAL CELL CARCINOMA EXCISION  2001   "cut it out & did a flap, on forehead between my eyebrows"   BREAST IMPLANT EXCHANGE Bilateral 2001   [id number]   BREAST IMPLANT EXCHANGE Left 05/05/2021   Procedure: BREAST IMPLANT EXCHANGE;  Surgeon: Peggye Form, DO;  Location: [facility] OR;  Service: Plastics;  Laterality: Left;   BREAST IMPLANT REMOVAL Right 02/05/2020   Procedure: REMOVAL RIGHT BREAST IMPLANT AND CAPSULECTOMY;  Surgeon: Griselda Miner, MD;  Location: [facility];  Service: General;  Laterality: Right;   BREAST LUMPECTOMY Right 02/05/2020   Procedure: RIGHT BREAST CENTRAL LUMPECTOMY;  Surgeon: Chevis Pretty  III, MD;  Location: [facility];  Service: General;  Laterality: Right;   BREAST RECONSTRUCTION WITH PLACEMENT OF TISSUE EXPANDER AND FLEX HD (ACELLULAR HYDRATED DERMIS) Right 01/20/2021   Procedure: BREAST RECONSTRUCTION WITH PLACEMENT OF TISSUE EXPANDER AND FLEX HD (ACELLULAR HYDRATED DERMIS);  Surgeon: Peggye Form, DO;  Location: [facility];  Service: Plastics;  Laterality: Right;  2 hours   CARPAL TUNNEL RELEASE Right 2006   COLONOSCOPY     DILATION AND CURETTAGE OF UTERUS  1970s X 2-3   ELECTROPHYSIOLOGIC STUDY  1994 X 2;2001   "to see it it was sustained VT; cause thyroid levels were causing arrhythmias"   EYE SURGERY  12/2020   cataracts   ICD IMPLANT N/A 12/28/2022   Procedure: ICD IMPLANT;  Surgeon: [name], MD;  Location: [facility];  Service: Cardiovascular;  Laterality: N/A;   LAPAROSCOPIC CHOLECYSTECTOMY     MASTECTOMY Bilateral 1992   "subcutaneous"   MELANOMA EXCISION Left 2001   "ankle, stage I"   PLACEMENT OF BREAST IMPLANTS Bilateral 1992   [id number]   PVC ABLATION N/A 12/23/2022   Procedure: PVC ABLATION;  Surgeon: Regan Lemming, MD;  Location: [facility];  Service: Cardiovascular;  Laterality: N/A;   REMOVAL OF TISSUE EXPANDER AND PLACEMENT OF IMPLANT Right 05/05/2021   Procedure: REMOVAL OF TISSUE EXPANDER AND PLACEMENT OF IMPLANT;  Surgeon: Peggye Form, DO;  Location: [facility];  Service: Plastics;  Laterality: Right;   TOTAL THYROIDECTOMY  12/1999   "cancer"   VENTRICULAR ABLATION SURGERY  2011   ventricular tchycardia     Family History  Problem Relation Age of Onset   Other Brother        AGENT ORANGE and AntiLupus   Heart disease Brother        Stents and bypass x2   Arrhythmia Brother        AFIB   Heart attack Brother    Hypertension Brother     Squamous cell carcinoma Brother 20       Skin   Hyperlipidemia Mother 3   Osteoporosis Mother    Basal cell carcinoma Mother 44   Heart disease Father 52   Other Father        Cardiac arrest   Breast cancer Maternal Grandmother 47       metastatic   Breast cancer Other        dx. unknown age; maternal great-aunt   Lung cancer Maternal Aunt 69       hx. of smoking   Other Paternal Aunt        bilateral mastectomies due to multiple breast lumps   Leukemia Maternal Aunt    Cancer Maternal Aunt        unknown types   Thyroid cancer Neg Hx      Social History   Socioeconomic History   Marital status: Married    Spouse name: Education officer, community (Spouse)   Number of children: Not on file   Years of education: Not on file   Highest education level: Not on file  Occupational History   Occupation: Adult nurse GI    Employer: [employer]  Tobacco Use   Smoking status: Never    Passive exposure: Never   Smokeless tobacco: Never  Vaping Use   Vaping Use: Never used  Substance and Sexual Activity   Alcohol use: No   Drug use: No   Sexual activity: Yes  Other Topics Concern   Not on  file  Social History Narrative   Married   Social Determinants of Health   Financial Resource Strain: Not on file  Food Insecurity: No Food Insecurity (12/26/2022)   Hunger Vital Sign    Worried About Running Out of Food in the Last Year: Never true    Ran Out of Food in the Last Year: Never true  Transportation Needs: No Transportation Needs (12/26/2022)   PRAPARE - Administrator, Civil Service (Medical): No    Lack of Transportation (Non-Medical): No  Physical Activity: Not on file  Stress: Not on file  Social Connections: Not on file  Intimate Partner Violence: Not At Risk (12/26/2022)   Humiliation, Afraid, Rape, and Kick questionnaire    Fear of Current or Ex-Partner: No    Emotionally Abused: No    Physically Abused: No    Sexually Abused: No     BP 130/68   Pulse 72   Ht  5' 4.5" (1.638 m)   Wt 149 lb (67.6 kg)   SpO2 97%   BMI 25.18 kg/m   Physical Exam:  Well appearing NAD HEENT: Unremarkable Neck:  No JVD, no thyromegally Lymphatics:  No adenopathy Back:  No CVA tenderness Lungs:  Clear with no wheezes HEART:  Regular rate rhythm, no murmurs, no rubs, no clicks Abd:  soft, positive bowel sounds, no organomegally, no rebound, no guarding Ext:  2 plus pulses, no edema, no cyanosis, no clubbing Skin:  No rashes no nodules Neuro:  CN II through XII intact, motor grossly intact  EKG - nsr with PAC's  DEVICE  Normal device function.  See PaceArt for details.   Assess/Plan: 1. PAF - she is maintaining NSR on sotalol. 2. PVC's - she appears to have minimal PVC's on sotalol 3. Dyspnea - unclear if she has developed any LV dysfunction by echo. I have asked her to undergo 2D echo.  4. HTN - her bp is mildly elevated. We will not agressively try to lower it further.    Sharlot Gowda Abbey Veith,MD

## 2023-04-06 NOTE — Patient Instructions (Addendum)
Medication Instructions:  Your physician recommends that you continue on your current medications as directed. Please refer to the Current Medication list given to you today.  *If you need a refill on your cardiac medications before your next appointment, please call your pharmacy*  Lab Work: None ordered.  If you have labs (blood work) drawn today and your tests are completely normal, you will receive your results only by: MyChart Message (if you have MyChart) OR A paper copy in the mail If you have any lab test that is abnormal or we need to change your treatment, we will call you to review the results.  Testing/Procedures: None ordered.  Follow-Up: At [facility], you and your health needs are our priority.  As part of our continuing mission to provide you with exceptional heart care, we have created designated Provider Care Teams.  These Care Teams include your primary Cardiologist (physician) and Advanced Practice Providers (APPs -  Physician Assistants and Nurse Practitioners) who all work together to provide you with the care you need, when you need it.   Your next appointment:   1 year(s)  The format for your next appointment:   In Person  Provider:   Lewayne Bunting, MD{or one of the following Advanced Practice Providers on your designated Care Team:   Francis Dowse, [place] Casimiro Needle "Mardelle Matte" Lanna Poche, [place]  Remote monitoring is used to monitor your ICD from home. This monitoring reduces the number of office visits required to check your device to one time per year. It allows [place] to keep an eye on the functioning of your device to ensure it is working properly. You are scheduled for a device check from home on 9/3. You may send your transmission at any time that day. If you have a wireless device, the transmission will be sent automatically. After your physician reviews your transmission, you will receive a postcard with your next transmission date.

## 2023-04-07 ENCOUNTER — Other Ambulatory Visit (HOSPITAL_COMMUNITY): Payer: Self-pay

## 2023-04-07 ENCOUNTER — Other Ambulatory Visit: Payer: Self-pay

## 2023-04-08 DIAGNOSIS — I10 Essential (primary) hypertension: Secondary | ICD-10-CM | POA: Diagnosis not present

## 2023-04-08 DIAGNOSIS — G4700 Insomnia, unspecified: Secondary | ICD-10-CM | POA: Diagnosis not present

## 2023-04-08 DIAGNOSIS — E559 Vitamin D deficiency, unspecified: Secondary | ICD-10-CM | POA: Diagnosis not present

## 2023-04-08 DIAGNOSIS — E039 Hypothyroidism, unspecified: Secondary | ICD-10-CM | POA: Diagnosis not present

## 2023-04-08 DIAGNOSIS — M797 Fibromyalgia: Secondary | ICD-10-CM | POA: Diagnosis not present

## 2023-04-08 DIAGNOSIS — F330 Major depressive disorder, recurrent, mild: Secondary | ICD-10-CM | POA: Diagnosis not present

## 2023-04-08 DIAGNOSIS — E785 Hyperlipidemia, unspecified: Secondary | ICD-10-CM | POA: Diagnosis not present

## 2023-04-22 NOTE — Progress Notes (Signed)
Remote ICD transmission.   

## 2023-05-17 ENCOUNTER — Other Ambulatory Visit: Payer: Self-pay

## 2023-06-11 ENCOUNTER — Other Ambulatory Visit: Payer: Self-pay

## 2023-06-17 ENCOUNTER — Other Ambulatory Visit: Payer: Self-pay

## 2023-06-17 ENCOUNTER — Other Ambulatory Visit (HOSPITAL_COMMUNITY): Payer: Self-pay

## 2023-06-21 ENCOUNTER — Other Ambulatory Visit (HOSPITAL_COMMUNITY): Payer: Self-pay

## 2023-06-21 MED ORDER — METHOCARBAMOL 500 MG PO TABS
500.0000 mg | ORAL_TABLET | Freq: Three times a day (TID) | ORAL | 1 refills | Status: DC | PRN
  Filled 2023-06-21: qty 30, 10d supply, fill #0
  Filled 2023-08-19: qty 30, 10d supply, fill #1

## 2023-06-22 ENCOUNTER — Other Ambulatory Visit: Payer: Self-pay

## 2023-06-29 ENCOUNTER — Ambulatory Visit (INDEPENDENT_AMBULATORY_CARE_PROVIDER_SITE_OTHER): Payer: PPO

## 2023-06-29 DIAGNOSIS — I4729 Other ventricular tachycardia: Secondary | ICD-10-CM | POA: Diagnosis not present

## 2023-06-29 DIAGNOSIS — I480 Paroxysmal atrial fibrillation: Secondary | ICD-10-CM

## 2023-06-29 LAB — CUP PACEART REMOTE DEVICE CHECK
Battery Remaining Longevity: 142 mo
Battery Voltage: 3.05 V
Brady Statistic AP VP Percent: 0.13 %
Brady Statistic AP VS Percent: 28.84 %
Brady Statistic AS VP Percent: 0.06 %
Brady Statistic AS VS Percent: 70.98 %
Brady Statistic RA Percent Paced: 30.83 %
Brady Statistic RV Percent Paced: 0.19 %
Date Time Interrogation Session: 20240901205317
HighPow Impedance: 67 Ohm
Implantable Lead Connection Status: 753985
Implantable Lead Connection Status: 753985
Implantable Lead Implant Date: 20240304
Implantable Lead Implant Date: 20240304
Implantable Lead Location: 753859
Implantable Lead Location: 753860
Implantable Lead Model: 5076
Implantable Pulse Generator Implant Date: 20240304
Lead Channel Impedance Value: 285 Ohm
Lead Channel Impedance Value: 380 Ohm
Lead Channel Impedance Value: 513 Ohm
Lead Channel Pacing Threshold Amplitude: 0.875 V
Lead Channel Pacing Threshold Amplitude: 0.875 V
Lead Channel Pacing Threshold Pulse Width: 0.4 ms
Lead Channel Pacing Threshold Pulse Width: 0.4 ms
Lead Channel Sensing Intrinsic Amplitude: 10.4 mV
Lead Channel Sensing Intrinsic Amplitude: 2.3 mV
Lead Channel Setting Pacing Amplitude: 1.5 V
Lead Channel Setting Pacing Amplitude: 2 V
Lead Channel Setting Pacing Pulse Width: 0.4 ms
Lead Channel Setting Sensing Sensitivity: 0.3 mV
Zone Setting Status: 755011

## 2023-07-02 ENCOUNTER — Other Ambulatory Visit: Payer: Self-pay

## 2023-07-06 ENCOUNTER — Other Ambulatory Visit: Payer: Self-pay

## 2023-07-06 ENCOUNTER — Other Ambulatory Visit (HOSPITAL_COMMUNITY): Payer: Self-pay

## 2023-07-06 ENCOUNTER — Other Ambulatory Visit: Payer: Self-pay | Admitting: General Surgery

## 2023-07-06 DIAGNOSIS — Z1239 Encounter for other screening for malignant neoplasm of breast: Secondary | ICD-10-CM

## 2023-07-07 NOTE — Progress Notes (Signed)
Remote ICD transmission.   

## 2023-07-08 ENCOUNTER — Other Ambulatory Visit: Payer: Self-pay

## 2023-08-02 DIAGNOSIS — Z170 Estrogen receptor positive status [ER+]: Secondary | ICD-10-CM | POA: Diagnosis not present

## 2023-08-02 DIAGNOSIS — C50111 Malignant neoplasm of central portion of right female breast: Secondary | ICD-10-CM | POA: Diagnosis not present

## 2023-08-05 ENCOUNTER — Other Ambulatory Visit (HOSPITAL_COMMUNITY): Payer: Self-pay

## 2023-08-05 ENCOUNTER — Telehealth: Payer: Self-pay | Admitting: *Deleted

## 2023-08-05 DIAGNOSIS — K219 Gastro-esophageal reflux disease without esophagitis: Secondary | ICD-10-CM | POA: Diagnosis not present

## 2023-08-05 DIAGNOSIS — I4940 Unspecified premature depolarization: Secondary | ICD-10-CM | POA: Diagnosis not present

## 2023-08-05 DIAGNOSIS — R1310 Dysphagia, unspecified: Secondary | ICD-10-CM | POA: Diagnosis not present

## 2023-08-05 DIAGNOSIS — I480 Paroxysmal atrial fibrillation: Secondary | ICD-10-CM | POA: Diagnosis not present

## 2023-08-05 MED ORDER — OMEPRAZOLE 40 MG PO CPDR
40.0000 mg | DELAYED_RELEASE_CAPSULE | Freq: Every morning | ORAL | 3 refills | Status: DC
  Filled 2023-08-05: qty 30, 30d supply, fill #0
  Filled 2023-09-02: qty 30, 30d supply, fill #1
  Filled 2023-09-30: qty 30, 30d supply, fill #2
  Filled 2023-10-30: qty 30, 30d supply, fill #3

## 2023-08-05 NOTE — Telephone Encounter (Signed)
Pre-operative Risk Assessment    Patient Name: Patricia Alvarado  DOB: 21-Jan-1946 MRN: [id number]    DATE OF LAST VISIT: 04/06/23 DR. Ladona Ridgel DATE OF NEXT VISIT: NONE  Request for Surgical Clearance    Procedure:   EGD WITH DILATION   Date of Surgery:  Clearance 09/01/23                                 Surgeon:  DR. Jennye Boroughs Surgeon's Group or Practice Name:  [facility]  Phone number:  [phone] Fax number:  [phone]   Type of Clearance Requested:   - Medical  - Pharmacy:  Hold Apixaban (Eliquis) x 2 DAYS PRIOR   Type of Anesthesia:  Not Indicated   Additional requests/questions:    Elpidio Anis   08/05/2023, 6:03 PM

## 2023-08-06 ENCOUNTER — Telehealth: Payer: Self-pay | Admitting: *Deleted

## 2023-08-06 ENCOUNTER — Other Ambulatory Visit: Payer: Self-pay

## 2023-08-06 NOTE — Telephone Encounter (Signed)
Patient with diagnosis of afib on Eliquis for anticoagulation.    Procedure:  EGD WITH DILATION  Date of procedure: 09/01/23   CHA2DS2-VASc Score = 4   This indicates a 4.8% annual risk of stroke. The patient's score is based upon: CHF History: 0 HTN History: 1 Diabetes History: 0 Stroke History: 0 Vascular Disease History: 0 Age Score: 2 Gender Score: 1      CrCl 75 ml/min  Per office protocol, patient can hold Eliquis for 2 days prior to procedure.    **This guidance is not considered finalized until pre-operative APP has relayed final recommendations.**

## 2023-08-06 NOTE — Telephone Encounter (Signed)
Name: Patricia Alvarado  DOB: [birth year]  MRN: [id number]  Primary Cardiologist: None   Preoperative team, please contact this patient and set up a phone call appointment for further preoperative risk assessment. Please obtain consent and complete medication review. Thank you for your help.  I confirm that guidance regarding antiplatelet and oral anticoagulation therapy has been completed and, if necessary, noted below.  Per office protocol, patient can hold Eliquis for 2 days prior to procedure.  Please resume Eliquis as soon as possible postprocedure, at the discretion of the surgeon.    I also confirmed the patient resides in the state of [place]. As per [facility] Medical Board telemedicine laws, the patient must reside in the state in which the provider is licensed.   Joylene Grapes, NP 08/06/2023, 1:48 PM [facility]

## 2023-08-06 NOTE — Telephone Encounter (Signed)
Pt has been scheduled for tele pre op appt 08/23/23. Med rec and consent are done.     Patient Consent for Virtual Visit        Patricia Alvarado has provided verbal consent on 08/06/2023 for a virtual visit (video or telephone).   CONSENT FOR VIRTUAL VISIT FOR:  Patricia Alvarado  By participating in this virtual visit I agree to the following:  I hereby voluntarily request, consent and authorize [facility] and its employed or contracted physicians, physician assistants, nurse practitioners or other licensed health care professionals (the Practitioner), to provide me with telemedicine health care services (the "Services") as deemed necessary by the treating Practitioner. I acknowledge and consent to receive the Services by the Practitioner via telemedicine. I understand that the telemedicine visit will involve communicating with the Practitioner through live audiovisual communication technology and the disclosure of certain medical information by electronic transmission. I acknowledge that I have been given the opportunity to request an in-person assessment or other available alternative prior to the telemedicine visit and am voluntarily participating in the telemedicine visit.  I understand that I have the right to withhold or withdraw my consent to the use of telemedicine in the course of my care at any time, without affecting my right to future care or treatment, and that the Practitioner or I may terminate the telemedicine visit at any time. I understand that I have the right to inspect all information obtained and/or recorded in the course of the telemedicine visit and may receive copies of available information for a reasonable fee.  I understand that some of the potential risks of receiving the Services via telemedicine include:  Delay or interruption in medical evaluation due to technological equipment failure or disruption; Information transmitted may not be sufficient (e.g. poor  resolution of images) to allow for appropriate medical decision making by the Practitioner; and/or  In rare instances, security protocols could fail, causing a breach of personal health information.  Furthermore, I acknowledge that it is my responsibility to provide information about my medical history, conditions and care that is complete and accurate to the best of my ability. I acknowledge that Practitioner's advice, recommendations, and/or decision may be based on factors not within their control, such as incomplete or inaccurate data provided by me or distortions of diagnostic images or specimens that may result from electronic transmissions. I understand that the practice of medicine is not an exact science and that Practitioner makes no warranties or guarantees regarding treatment outcomes. I acknowledge that a copy of this consent can be made available to me via my patient portal [facility] MyChart), or I can request a printed copy by calling the office of [facility].    I understand that my insurance will be billed for this visit.   I have read or had this consent read to me. I understand the contents of this consent, which adequately explains the benefits and risks of the Services being provided via telemedicine.  I have been provided ample opportunity to ask questions regarding this consent and the Services and have had my questions answered to my satisfaction. I give my informed consent for the services to be provided through the use of telemedicine in my medical care

## 2023-08-06 NOTE — Telephone Encounter (Signed)
Pt has been scheduled for tele pre op appt 08/23/23. Med rec and consent are done.

## 2023-08-09 DIAGNOSIS — I700 Atherosclerosis of aorta: Secondary | ICD-10-CM | POA: Diagnosis not present

## 2023-08-09 DIAGNOSIS — Z2821 Immunization not carried out because of patient refusal: Secondary | ICD-10-CM | POA: Diagnosis not present

## 2023-08-09 DIAGNOSIS — M797 Fibromyalgia: Secondary | ICD-10-CM | POA: Diagnosis not present

## 2023-08-09 DIAGNOSIS — J84112 Idiopathic pulmonary fibrosis: Secondary | ICD-10-CM | POA: Diagnosis not present

## 2023-08-09 DIAGNOSIS — E039 Hypothyroidism, unspecified: Secondary | ICD-10-CM | POA: Diagnosis not present

## 2023-08-09 DIAGNOSIS — I480 Paroxysmal atrial fibrillation: Secondary | ICD-10-CM | POA: Diagnosis not present

## 2023-08-09 DIAGNOSIS — R7309 Other abnormal glucose: Secondary | ICD-10-CM | POA: Diagnosis not present

## 2023-08-09 DIAGNOSIS — I10 Essential (primary) hypertension: Secondary | ICD-10-CM | POA: Diagnosis not present

## 2023-08-09 DIAGNOSIS — E785 Hyperlipidemia, unspecified: Secondary | ICD-10-CM | POA: Diagnosis not present

## 2023-08-09 DIAGNOSIS — E559 Vitamin D deficiency, unspecified: Secondary | ICD-10-CM | POA: Diagnosis not present

## 2023-08-09 DIAGNOSIS — F330 Major depressive disorder, recurrent, mild: Secondary | ICD-10-CM | POA: Diagnosis not present

## 2023-08-10 ENCOUNTER — Other Ambulatory Visit (HOSPITAL_COMMUNITY): Payer: Self-pay

## 2023-08-11 NOTE — Telephone Encounter (Signed)
Requesting office sent duplicate request. Pt has tele pre op appt 08/23/23, which at that time clearance will be addressed. Once pt has been cleared we will be sure to fax notes.

## 2023-08-12 ENCOUNTER — Other Ambulatory Visit (HOSPITAL_COMMUNITY): Payer: Self-pay

## 2023-08-12 MED ORDER — ALPRAZOLAM 0.5 MG PO TABS
0.5000 mg | ORAL_TABLET | Freq: Every evening | ORAL | 1 refills | Status: DC | PRN
  Filled 2023-08-12: qty 30, 30d supply, fill #0
  Filled 2023-09-29: qty 30, 30d supply, fill #1

## 2023-08-12 MED ORDER — EZETIMIBE 10 MG PO TABS
10.0000 mg | ORAL_TABLET | Freq: Every day | ORAL | 4 refills | Status: DC
  Filled 2023-08-12: qty 90, 90d supply, fill #0
  Filled 2023-11-06: qty 90, 90d supply, fill #1
  Filled 2024-02-07: qty 90, 90d supply, fill #2
  Filled 2024-05-08: qty 90, 90d supply, fill #3
  Filled 2024-08-02: qty 90, 90d supply, fill #4

## 2023-08-13 ENCOUNTER — Other Ambulatory Visit (HOSPITAL_COMMUNITY): Payer: Self-pay

## 2023-08-19 ENCOUNTER — Other Ambulatory Visit (HOSPITAL_COMMUNITY): Payer: Self-pay

## 2023-08-23 ENCOUNTER — Ambulatory Visit: Payer: PPO | Attending: Cardiology

## 2023-08-23 DIAGNOSIS — Z01810 Encounter for preprocedural cardiovascular examination: Secondary | ICD-10-CM | POA: Diagnosis not present

## 2023-08-23 NOTE — Progress Notes (Signed)
Virtual Visit via Telephone Note   Because of Patricia Alvarado's co-morbid illnesses, she is at least at moderate risk for complications without adequate follow up.  This format is felt to be most appropriate for this patient at this time.  The patient did not have access to video technology/had technical difficulties with video requiring transitioning to audio format only (telephone).  All issues noted in this document were discussed and addressed.  No physical exam could be performed with this format.  Please refer to the patient's chart for her consent to telehealth for [facility].  Evaluation Performed:  Preoperative cardiovascular risk assessment _____________   Date:  08/23/2023   Patient ID:  Patricia Alvarado, DOB 1946/01/03, MRN [id number] Patient Location:  Home Provider location:   Office  Primary Care Provider:  [name], NP Primary Cardiologist:  None  Chief Complaint / Patient Profile   77 y.o. y/o female with a h/o paroxysmal AF, breast CA HLD, HTN, hypothyroidism, MVP, VT who is pending EGD with dilation and presents today for telephonic preoperative cardiovascular risk assessment.  History of Present Illness    Patricia Alvarado is a 77 y.o. female who presents via audio/video conferencing for a telehealth visit today.  Pt was last seen in cardiology clinic on 04/06/2023 by Dr. Ladona Alvarado. At that time Patricia Alvarado was doing well had complaint of shortness of breath and was advised to undergo a 2D echo that has not been completed.  She was also noted to have mildly elevated BP and decided not to aggressively try to lower it further.  The patient is now pending procedure as outlined above. Since her last visit, she reports that her shortness of breath is still occurring however it is relieved with rest with no complaints of chest pain.  She denies chest pain, shortness of breath, lower extremity edema, fatigue, palpitations, melena, hematuria, hemoptysis,  diaphoresis, weakness, presyncope, syncope, orthopnea, and PND.   Per office protocol, patient can hold Eliquis for 2 days prior to procedure.     Past Medical History    Past Medical History:  Diagnosis Date   A-fib [facility])    Arthritis    thumb   Basal cell carcinoma 2001   "forehead, between eyebrows" right arm (2022)   Breast cancer (HCC)    Carcinoma of thyroid gland (HCC) 2001   Chronic lower back pain    "worse is across my hips" (05/26/2016)   Depression    Dyslipidemia    Dyspnea    Family history of breast cancer    Family history of kidney cancer    Family history of leukemia    Family history of nonmelanoma skin cancer    Fibrocystic breast    Fibromyalgia    GERD (gastroesophageal reflux disease)    Heart murmur    History of blood transfusion 1992   "after subcutaneous mastectomies"   Hypertension    not on any medications   Hypothyroidism    Malignant melanoma of left ankle (HCC) 2001   Migraine    "visual; 2-3 times/year" (05/26/2016)   Mitral valve prolapse    PONV (postoperative nausea and vomiting)    Ventricular tachycardia (HCC)    Hx of, controlled on sotalol therapy   Past Surgical History:  Procedure Laterality Date   ABDOMINAL HYSTERECTOMY  1998   ANTERIOR CERVICAL DECOMP/DISCECTOMY FUSION  2001   ATRIAL FIBRILLATION ABLATION N/A 09/12/2021   Procedure: ATRIAL FIBRILLATION ABLATION;  Surgeon: Patricia Lemming,  MD;  Location: [facility];  Service: Cardiovascular;  Laterality: N/A;   BACK SURGERY     BASAL CELL CARCINOMA EXCISION  2001   "cut it out & did a flap, on forehead between my eyebrows"   BREAST IMPLANT EXCHANGE Bilateral 2001   [id number]   BREAST IMPLANT EXCHANGE Left 05/05/2021   Procedure: BREAST IMPLANT EXCHANGE;  Surgeon: Peggye Form, DO;  Location: [facility] OR;  Service: Plastics;  Laterality: Left;   BREAST IMPLANT REMOVAL Right 02/05/2020   Procedure: REMOVAL RIGHT BREAST IMPLANT AND CAPSULECTOMY;  Surgeon: Patricia Miner, MD;  Location: [facility];  Service: General;  Laterality: Right;   BREAST LUMPECTOMY Right 02/05/2020   Procedure: RIGHT BREAST CENTRAL LUMPECTOMY;  Surgeon: Patricia Miner, MD;  Location: [facility];  Service: General;  Laterality: Right;   BREAST RECONSTRUCTION WITH PLACEMENT OF TISSUE EXPANDER AND FLEX HD (ACELLULAR HYDRATED DERMIS) Right 01/20/2021   Procedure: BREAST RECONSTRUCTION WITH PLACEMENT OF TISSUE EXPANDER AND FLEX HD (ACELLULAR HYDRATED DERMIS);  Surgeon: Peggye Form, DO;  Location: [facility] OR;  Service: Plastics;  Laterality: Right;  2 hours   CARPAL TUNNEL RELEASE Right 2006   COLONOSCOPY     DILATION AND CURETTAGE OF UTERUS  1970s X 2-3   ELECTROPHYSIOLOGIC STUDY  1994 X 2;2001   "to see it it was sustained VT; cause thyroid levels were causing arrhythmias"   EYE SURGERY  12/2020   cataracts   ICD IMPLANT N/A 12/28/2022   Procedure: ICD IMPLANT;  Surgeon: Patricia Maw, MD;  Location: [facility] INVASIVE CV LAB;  Service: Cardiovascular;  Laterality: N/A;   LAPAROSCOPIC CHOLECYSTECTOMY     MASTECTOMY Bilateral 1992   "subcutaneous"   MELANOMA EXCISION Left 2001   "ankle, stage I"   PLACEMENT OF BREAST IMPLANTS Bilateral 1992   [id number]   PVC ABLATION N/A 12/23/2022   Procedure: PVC ABLATION;  Surgeon: Patricia Lemming, MD;  Location: [facility];  Service: Cardiovascular;  Laterality: N/A;   REMOVAL OF TISSUE EXPANDER AND PLACEMENT OF IMPLANT Right 05/05/2021   Procedure: REMOVAL OF TISSUE EXPANDER AND PLACEMENT OF IMPLANT;  Surgeon: Peggye Form, DO;  Location: [facility] OR;  Service: Plastics;  Laterality: Right;   TOTAL THYROIDECTOMY  12/1999   "cancer"   VENTRICULAR ABLATION SURGERY  2011   ventricular tchycardia    Allergies  Allergies  Allergen Reactions   Quinidine Palpitations    REACTION: increased heart rate   Statins     Muscle pain   Tape     If left on long enough,will tear skin    Amoxicillin  Itching and Rash   Atenolol Hives   Dofetilide Other (See Comments)    Elevated QTCs   Mexiletine Hcl Palpitations    Dizzy and extreme tiredness   Nadolol Hives   Vicodin  [Hydrocodone-Acetaminophen] Swelling    Facial redness and swelling    Home Medications    Prior to Admission medications   Medication Sig Start Date End Date Taking? Authorizing Provider  acetaminophen (TYLENOL) 500 MG tablet Take 1,000 mg by mouth every 6 (six) hours as needed for mild pain or moderate pain.    [provider]  ALPRAZolam Prudy Feeler) 0.5 MG tablet Take 0.25 mg by mouth at bedtime. 11/27/19   [provider]  ALPRAZolam Prudy Feeler) 0.5 MG tablet Take 1 tablet (0.5 mg total) by mouth at bedtime as needed for sleep 08/12/23     apixaban (ELIQUIS) 5  MG TABS tablet Take 1 tablet (5 mg total) by mouth 2 (two) times daily. 02/19/23   Patricia Maw, MD  Ascorbic Acid (VITAMIN C) 1000 MG tablet Take 1,000 mg by mouth daily.    [provider]  B Complex-C (SUPER B COMPLEX PO) Take 1 capsule by mouth at bedtime.    [provider]  beta carotene 86578 UNIT capsule Take 10,000 Units by mouth at bedtime.    [provider]  Biotin 5 MG CAPS Take 5 mg by mouth at bedtime.     [provider]  Calcium 600-200 MG-UNIT tablet Take 1 tablet by mouth daily.    [provider]  cetirizine (ZYRTEC) 10 MG tablet Take 10 mg by mouth at bedtime.    [provider]  Cholecalciferol (VITAMIN D) 125 MCG (5000 UT) CAPS Take 5,000 Units by mouth daily.    [provider]  Coenzyme Q10 (COQ-10) 100 MG CAPS Take 100 mg by mouth daily.    [provider]  Digestive Enzyme CAPS Take 1 capsule by mouth daily as needed (heartburn).    [provider]  docusate sodium (COLACE) 250 MG capsule Take 500 mg by mouth daily.    [provider]  escitalopram (LEXAPRO) 20 MG tablet Take 20 mg by mouth at bedtime.    [provider]   escitalopram (LEXAPRO) 20 MG tablet Take 1 tablet (20 mg total) by mouth daily. 12/23/22     Evolocumab (REPATHA SURECLICK) 140 MG/ML SOAJ Inject 140 mg into the skin every 14 (fourteen) days. 10/27/22   Camnitz, Andree Coss, MD  ezetimibe (ZETIA) 10 MG tablet Take 10 mg by mouth daily.    [provider]  ezetimibe (ZETIA) 10 MG tablet Take 1 tablet (10 mg total) by mouth daily. 08/12/23     famotidine (PEPCID) 20 MG tablet Take 20 mg by mouth as needed for heartburn or indigestion.    [provider]  fluticasone (FLONASE) 50 MCG/ACT nasal spray Place 1 spray into both nostrils daily as needed for allergies. 01/31/18   [provider]  furosemide (LASIX) 20 MG tablet Take 20 mg by mouth daily as needed for fluid (1-2lb weight gain overnight).    [provider]  hydrocortisone cream 1 % Apply 1 Application topically daily as needed for itching.    [provider]  magnesium oxide (MAG-OX) 400 MG tablet Take 1 tablet (400 mg total) by mouth daily. 12/29/22   Sherie Don, NP  MANGANESE PO Take 40 mg by mouth at bedtime.    [provider]  Melatonin 10 MG TABS Take 10 mg by mouth at bedtime.     [provider]  Menatetrenone (VITAMIN K2) 100 MCG TABS Take 100 mcg by mouth daily.    [provider]  methocarbamol (ROBAXIN) 500 MG tablet Take 500 mg by mouth daily as needed for muscle spasms. 03/06/22   [provider]  methocarbamol (ROBAXIN) 500 MG tablet Take 1 tablet (500 mg total) by mouth every 8 (eight) hours as needed for neck pain 06/21/23     omeprazole (PRILOSEC) 40 MG capsule Take 1 capsule (40 mg total) by mouth every morning 30 minutes before breakfast 08/05/23     Polyvinyl Alcohol (LIQUID TEARS OP) Place 1 drop into both eyes daily as needed (dry eyes).    [provider]  potassium chloride SA (KLOR-CON M) 20 MEQ tablet Take 1 tablet (20 mEq total) by mouth 2 (two) times  daily. 12/29/22   Sherie Don, NP  Simethicone 125 MG CAPS Take 250 mg by mouth daily as needed (bloating).    [provider]  sotalol (BETAPACE) 160 MG tablet Take 1 tablet (160 mg total) by mouth 2 (two) times daily. 01/12/23   Camnitz, Andree Coss, MD  thyroid (ARMOUR) 120 MG tablet Take 120 mg by mouth daily before breakfast.    [provider]  triamcinolone (KENALOG) 0.025 % cream Apply 1 Application topically 2 (two) times daily as needed (rash).    [provider]  zinc gluconate 50 MG tablet Take 50 mg by mouth daily.    [provider]    Physical Exam    Vital Signs:  Berlin Jerue does not have vital signs available for review today.  Given telephonic nature of communication, physical exam is limited. AAOx3. NAD. Normal affect.  Speech and respirations are unlabored.  Accessory Clinical Findings    None  Assessment & Plan    1.  Preoperative Cardiovascular Risk Assessment: -Patient's RCRI score is 0.4%  Patient is not cleared until completing updated 2D echo per Dr. Ladona Alvarado.   The patient was advised that if she develops new symptoms prior to surgery to contact our office to arrange for a follow-up visit, and she verbalized understanding.  Per office protocol, patient can hold Eliquis for 2 days prior to procedure.   A copy of this note will be routed to requesting surgeon.  Time:   Today, I have spent 6 minutes with the patient with telehealth technology discussing medical history, symptoms, and management plan.     Napoleon Alvarado, Leodis Rains, NP  08/23/2023, 7:38 AM

## 2023-08-24 NOTE — Progress Notes (Signed)
Echo order has been placed and message has been sent to Donnie Coffin scheduler for echo.

## 2023-08-24 NOTE — Addendum Note (Signed)
Addended by: Tarri Fuller on: 08/24/2023 05:37 PM   Modules accepted: Orders

## 2023-08-25 NOTE — Progress Notes (Signed)
Thank you Misty Stanley,   I will update the pre op APP.   Legrand Rams, CMA I will be glad to..just FYI.... we are booked til the end of November. ;(       Previous Messages    ----- Message ----- From: Tarri Fuller, CMA Sent: 08/24/2023   5:39 PM EDT To: Minette Brine Subject: ECHO FOR PRE OP CLEARANCE PER DR. Ladona Ridgel      Hi sweet lady,   Dr. Ladona Ridgel, said the pt needs an echo before she can be cleared. I have placed the order. Will you please reach out to the pt with an appt for echo.  Thank you Okey Regal :)

## 2023-09-02 ENCOUNTER — Other Ambulatory Visit (HOSPITAL_COMMUNITY): Payer: Self-pay

## 2023-09-15 ENCOUNTER — Other Ambulatory Visit (HOSPITAL_COMMUNITY): Payer: Self-pay

## 2023-09-20 ENCOUNTER — Ambulatory Visit: Payer: PPO | Attending: Cardiology

## 2023-09-20 DIAGNOSIS — I088 Other rheumatic multiple valve diseases: Secondary | ICD-10-CM | POA: Diagnosis not present

## 2023-09-20 DIAGNOSIS — I517 Cardiomegaly: Secondary | ICD-10-CM | POA: Diagnosis not present

## 2023-09-20 DIAGNOSIS — Z01810 Encounter for preprocedural cardiovascular examination: Secondary | ICD-10-CM

## 2023-09-20 LAB — ECHOCARDIOGRAM COMPLETE
MV M vel: 6.01 m/s
MV Peak grad: 144.5 mm[Hg]
P 1/2 time: 452 ms
Radius: 0.95 cm
S' Lateral: 4 cm

## 2023-09-21 ENCOUNTER — Encounter: Payer: Self-pay | Admitting: Internal Medicine

## 2023-09-27 LAB — CUP PACEART REMOTE DEVICE CHECK
Battery Remaining Longevity: 140 mo
Battery Voltage: 3.03 V
Brady Statistic RV Percent Paced: 0.37 %
Date Time Interrogation Session: 20241201085509
HighPow Impedance: 70 Ohm
Implantable Lead Connection Status: 753985
Implantable Lead Connection Status: 753985
Implantable Lead Implant Date: 20240304
Implantable Lead Implant Date: 20240304
Implantable Lead Location: 753859
Implantable Lead Location: 753860
Implantable Lead Model: 5076
Implantable Pulse Generator Implant Date: 20240304
Lead Channel Impedance Value: 285 Ohm
Lead Channel Impedance Value: 380 Ohm
Lead Channel Impedance Value: 532 Ohm
Lead Channel Pacing Threshold Amplitude: 0.75 V
Lead Channel Pacing Threshold Amplitude: 1 V
Lead Channel Pacing Threshold Pulse Width: 0.4 ms
Lead Channel Pacing Threshold Pulse Width: 0.4 ms
Lead Channel Sensing Intrinsic Amplitude: 11.5 mV
Lead Channel Sensing Intrinsic Amplitude: 2.5 mV
Lead Channel Setting Pacing Amplitude: 1.5 V
Lead Channel Setting Pacing Amplitude: 2 V
Lead Channel Setting Pacing Pulse Width: 0.4 ms
Lead Channel Setting Sensing Sensitivity: 0.3 mV
Zone Setting Status: 755011

## 2023-09-28 ENCOUNTER — Ambulatory Visit (INDEPENDENT_AMBULATORY_CARE_PROVIDER_SITE_OTHER): Payer: PPO

## 2023-09-28 DIAGNOSIS — I472 Ventricular tachycardia, unspecified: Secondary | ICD-10-CM | POA: Diagnosis not present

## 2023-09-29 ENCOUNTER — Other Ambulatory Visit: Payer: Self-pay

## 2023-09-29 ENCOUNTER — Other Ambulatory Visit (HOSPITAL_COMMUNITY): Payer: Self-pay

## 2023-09-29 DIAGNOSIS — Z01812 Encounter for preprocedural laboratory examination: Secondary | ICD-10-CM

## 2023-09-30 ENCOUNTER — Other Ambulatory Visit: Payer: Self-pay

## 2023-09-30 ENCOUNTER — Other Ambulatory Visit (HOSPITAL_COMMUNITY): Payer: Self-pay

## 2023-09-30 MED ORDER — METHOCARBAMOL 500 MG PO TABS
500.0000 mg | ORAL_TABLET | Freq: Three times a day (TID) | ORAL | 1 refills | Status: DC | PRN
  Filled 2023-09-30: qty 30, 10d supply, fill #0
  Filled 2023-12-20: qty 30, 10d supply, fill #1

## 2023-10-01 ENCOUNTER — Other Ambulatory Visit: Payer: Self-pay

## 2023-10-01 DIAGNOSIS — Z01812 Encounter for preprocedural laboratory examination: Secondary | ICD-10-CM

## 2023-10-02 LAB — CBC
Hematocrit: 41.4 % (ref 34.0–46.6)
Hemoglobin: 13.7 g/dL (ref 11.1–15.9)
MCH: 30.9 pg (ref 26.6–33.0)
MCHC: 33.1 g/dL (ref 31.5–35.7)
MCV: 94 fL (ref 79–97)
Platelets: 202 10*3/uL (ref 150–450)
RBC: 4.43 x10E6/uL (ref 3.77–5.28)
RDW: 12.3 % (ref 11.7–15.4)
WBC: 5.1 10*3/uL (ref 3.4–10.8)

## 2023-10-02 LAB — BASIC METABOLIC PANEL
BUN/Creatinine Ratio: 29 — ABNORMAL HIGH (ref 12–28)
BUN: 18 mg/dL (ref 8–27)
CO2: 23 mmol/L (ref 20–29)
Calcium: 9.3 mg/dL (ref 8.7–10.3)
Chloride: 103 mmol/L (ref 96–106)
Creatinine, Ser: 0.62 mg/dL (ref 0.57–1.00)
Glucose: 119 mg/dL — ABNORMAL HIGH (ref 70–99)
Potassium: 4.1 mmol/L (ref 3.5–5.2)
Sodium: 141 mmol/L (ref 134–144)
eGFR: 92 mL/min/{1.73_m2} (ref >59)

## 2023-10-03 ENCOUNTER — Other Ambulatory Visit: Payer: Self-pay | Admitting: Cardiology

## 2023-10-05 ENCOUNTER — Other Ambulatory Visit (HOSPITAL_COMMUNITY): Payer: Self-pay

## 2023-10-05 ENCOUNTER — Other Ambulatory Visit: Payer: Self-pay

## 2023-10-05 MED ORDER — SOTALOL HCL 160 MG PO TABS
160.0000 mg | ORAL_TABLET | Freq: Two times a day (BID) | ORAL | 2 refills | Status: DC
  Filled 2023-10-05: qty 180, 90d supply, fill #0
  Filled 2023-12-30: qty 180, 90d supply, fill #1

## 2023-10-05 NOTE — OR Nursing (Signed)
Called patient with pre-procedure instructions for tomorrow.   Patient informed of:   Time to arrive for procedure. 1000 Remain NPO past midnight.  Must have a ride home and a responsible adult to remain with them for 24 hours post procedure.  Confirmed blood thinner. Eliquis Confirmed no breaks in taking blood thinner for 3+ weeks prior to procedure. Confirmed patient stopped all GLP-1s and GLP-2s for at least one week before procedure.

## 2023-10-06 ENCOUNTER — Ambulatory Visit (HOSPITAL_COMMUNITY): Payer: PPO | Admitting: Anesthesiology

## 2023-10-06 ENCOUNTER — Ambulatory Visit (HOSPITAL_COMMUNITY)
Admission: RE | Admit: 2023-10-06 | Discharge: 2023-10-06 | Disposition: A | Discharge status: Home / Self Care | Payer: PPO | Source: Ambulatory Visit | Attending: Cardiology | Admitting: Cardiology

## 2023-10-06 ENCOUNTER — Encounter (HOSPITAL_COMMUNITY)
Admission: RE | Disposition: A | Discharge status: Home / Self Care | Payer: Self-pay | Source: Home / Self Care | Attending: Cardiology

## 2023-10-06 ENCOUNTER — Other Ambulatory Visit: Payer: Self-pay

## 2023-10-06 ENCOUNTER — Ambulatory Visit (HOSPITAL_COMMUNITY)
Admission: RE | Admit: 2023-10-06 | Discharge: 2023-10-06 | Disposition: A | Discharge status: Home / Self Care | Payer: PPO | Attending: Cardiology | Admitting: Cardiology

## 2023-10-06 ENCOUNTER — Encounter (HOSPITAL_COMMUNITY): Payer: Self-pay | Admitting: Cardiology

## 2023-10-06 DIAGNOSIS — R0602 Shortness of breath: Secondary | ICD-10-CM

## 2023-10-06 DIAGNOSIS — I480 Paroxysmal atrial fibrillation: Secondary | ICD-10-CM | POA: Diagnosis not present

## 2023-10-06 DIAGNOSIS — E785 Hyperlipidemia, unspecified: Secondary | ICD-10-CM | POA: Diagnosis not present

## 2023-10-06 DIAGNOSIS — I059 Rheumatic mitral valve disease, unspecified: Secondary | ICD-10-CM

## 2023-10-06 DIAGNOSIS — Z95810 Presence of automatic (implantable) cardiac defibrillator: Secondary | ICD-10-CM | POA: Diagnosis not present

## 2023-10-06 DIAGNOSIS — I083 Combined rheumatic disorders of mitral, aortic and tricuspid valves: Secondary | ICD-10-CM | POA: Diagnosis not present

## 2023-10-06 DIAGNOSIS — K219 Gastro-esophageal reflux disease without esophagitis: Secondary | ICD-10-CM | POA: Diagnosis not present

## 2023-10-06 DIAGNOSIS — I361 Nonrheumatic tricuspid (valve) insufficiency: Secondary | ICD-10-CM | POA: Diagnosis not present

## 2023-10-06 DIAGNOSIS — D6869 Other thrombophilia: Secondary | ICD-10-CM

## 2023-10-06 DIAGNOSIS — Z7901 Long term (current) use of anticoagulants: Secondary | ICD-10-CM | POA: Diagnosis not present

## 2023-10-06 DIAGNOSIS — I10 Essential (primary) hypertension: Secondary | ICD-10-CM | POA: Diagnosis not present

## 2023-10-06 DIAGNOSIS — I340 Nonrheumatic mitral (valve) insufficiency: Secondary | ICD-10-CM | POA: Diagnosis not present

## 2023-10-06 DIAGNOSIS — Z8249 Family history of ischemic heart disease and other diseases of the circulatory system: Secondary | ICD-10-CM | POA: Insufficient documentation

## 2023-10-06 HISTORY — PX: TRANSESOPHAGEAL ECHOCARDIOGRAM (CATH LAB): EP1270

## 2023-10-06 LAB — ECHO TEE
Area-P 1/2: 3.83 cm2
MV M vel: 5.49 m/s
MV Peak grad: 120.6 mm[Hg]
Radius: 0.55 cm
S' Lateral: 3.2 cm

## 2023-10-06 SURGERY — TRANSESOPHAGEAL ECHOCARDIOGRAM (TEE) (CATHLAB)
Anesthesia: Monitor Anesthesia Care

## 2023-10-06 MED ORDER — NITROGLYCERIN 0.4 MG SL SUBL
0.4000 mg | SUBLINGUAL_TABLET | Freq: Once | SUBLINGUAL | Status: AC
  Administered 2023-10-06: .4 mg via SUBLINGUAL

## 2023-10-06 MED ORDER — PROPOFOL 10 MG/ML IV BOLUS
INTRAVENOUS | Status: DC | PRN
  Administered 2023-10-06: 20 mg via INTRAVENOUS
  Administered 2023-10-06: 60 mg via INTRAVENOUS
  Administered 2023-10-06: 30 mg via INTRAVENOUS

## 2023-10-06 MED ORDER — NITROGLYCERIN 0.4 MG SL SUBL
SUBLINGUAL_TABLET | SUBLINGUAL | Status: AC
  Filled 2023-10-06: qty 1

## 2023-10-06 MED ORDER — PHENYLEPHRINE HCL-NACL 20-0.9 MG/250ML-% IV SOLN
INTRAVENOUS | Status: DC | PRN
  Administered 2023-10-06: 20 ug/min via INTRAVENOUS

## 2023-10-06 MED ORDER — LIDOCAINE 2% (20 MG/ML) 5 ML SYRINGE
INTRAMUSCULAR | Status: DC | PRN
  Administered 2023-10-06: 100 mg via INTRAVENOUS

## 2023-10-06 MED ORDER — SODIUM CHLORIDE 0.9 % IV SOLN: INTRAVENOUS | Status: DC

## 2023-10-06 MED ORDER — PROPOFOL 500 MG/50ML IV EMUL
INTRAVENOUS | Status: DC | PRN
  Administered 2023-10-06: 100 ug/kg/min via INTRAVENOUS

## 2023-10-06 NOTE — Anesthesia Preprocedure Evaluation (Signed)
Anesthesia Evaluation  Patient identified by MRN, date of birth, ID band Patient awake    Reviewed: Allergy & Precautions, H&P , NPO status , Patient's Chart, lab work & pertinent test results  History of Anesthesia Complications (+) PONV and history of anesthetic complications  Airway Mallampati: II   Neck ROM: full    Dental   Pulmonary    breath sounds clear to auscultation       Cardiovascular hypertension, + dysrhythmias Ventricular Tachycardia + Cardiac Defibrillator + Valvular Problems/Murmurs MR  Rhythm:regular Rate:Normal  S/p EP ablation   Neuro/Psych  Headaches PSYCHIATRIC DISORDERS  Depression     Neuromuscular disease    GI/Hepatic ,GERD  ,,  Endo/Other  Hypothyroidism    Renal/GU      Musculoskeletal  (+) Arthritis ,  Fibromyalgia -  Abdominal   Peds  Hematology   Anesthesia Other Findings   Reproductive/Obstetrics                             Anesthesia Physical Anesthesia Plan  ASA: 4  Anesthesia Plan: MAC   Post-op Pain Management:    Induction: Intravenous  PONV Risk Score and Plan: 3 and Propofol infusion and Treatment may vary due to age or medical condition  Airway Management Planned: Nasal Cannula  Additional Equipment:   Intra-op Plan:   Post-operative Plan:   Informed Consent: I have reviewed the patients History and Physical, chart, labs and discussed the procedure including the risks, benefits and alternatives for the proposed anesthesia with the patient or authorized representative who has indicated his/her understanding and acceptance.     Dental advisory given  Plan Discussed with: CRNA, Anesthesiologist and Surgeon  Anesthesia Plan Comments:        Anesthesia Quick Evaluation

## 2023-10-06 NOTE — H&P (Signed)
Cardiology Admission History and Physical   Patient ID: Danis Ciano MRN: [id number]; DOB: 18-May-1946   Admission date: 10/06/2023  PCP:  [name], NP   [facility] Providers Cardiologist:  None  Electrophysiologist:  Lewayne Bunting, MD       Chief Complaint:  Elective transesophageal echocardiogram  Patient Profile:   Tyler Trigueros is a 77 y.o. female with Caucasian who is being seen 10/06/2023 for the evaluation of elective transesophageal echocardiogram for evaluation of valvular heart disease. Patient has a known history of paroxysmal atrial fibrillation, breast cancer, hyperlipidemia, hypertension, hypothyroidism, mitral valve regurgitation, history of ventricular tachycardia status post ICD implant.  History of Present Illness:   Ms. Hodge presents today for elective transesophageal echocardiogram to further evaluate mitral valve regurgitation.  She patient had a telephone encounter on August 23, 2023 for preprocedural clearance prior to endoscopy.  She was asked to undergo an echocardiogram prior to being her risk stratified.  The echocardiogram noted valvular heart disease and she was recommended to undergo transesophageal echocardiogram by Dr. Ladona Ridgel for further evaluation.  Clinically today she denies anginal chest pain or heart failure symptoms.  She has long history of shortness of breath predominantly with effort related activities which resolves with rest.  The reason why she was going to have an EGD done and was requesting a clearance that she had 1 episode in September 2024 when she had difficulty swallowing food.  This episode was self-limited and she has not had any reoccurrences of dysphagia.  However, she has been on PPI since this episode and symptoms have improved back to make sure she followed up with GI for further evaluation and management.   Past Medical History:  Diagnosis Date   A-fib [facility])    Arthritis    thumb   Basal cell  carcinoma 2001   "forehead, between eyebrows" right arm (2022)   Breast cancer (HCC)    Carcinoma of thyroid gland (HCC) 2001   Chronic lower back pain    "worse is across my hips" (05/26/2016)   Depression    Dyslipidemia    Dyspnea    Family history of breast cancer    Family history of kidney cancer    Family history of leukemia    Family history of nonmelanoma skin cancer    Fibrocystic breast    Fibromyalgia    GERD (gastroesophageal reflux disease)    Heart murmur    History of blood transfusion 1992   "after subcutaneous mastectomies"   Hypertension    not on any medications   Hypothyroidism    Malignant melanoma of left ankle (HCC) 2001   Migraine    "visual; 2-3 times/year" (05/26/2016)   Mitral valve prolapse    PONV (postoperative nausea and vomiting)    Ventricular tachycardia (HCC)    Hx of, controlled on sotalol therapy    Past Surgical History:  Procedure Laterality Date   ABDOMINAL HYSTERECTOMY  1998   ANTERIOR CERVICAL DECOMP/DISCECTOMY FUSION  2001   ATRIAL FIBRILLATION ABLATION N/A 09/12/2021   Procedure: ATRIAL FIBRILLATION ABLATION;  Surgeon: Regan Lemming, MD;  Location: [facility];  Service: Cardiovascular;  Laterality: N/A;   BACK SURGERY     BASAL CELL CARCINOMA EXCISION  2001   "cut it out & did a flap, on forehead between my eyebrows"   BREAST IMPLANT EXCHANGE Bilateral 2001   [id number]   BREAST IMPLANT EXCHANGE Left 05/05/2021   Procedure: BREAST IMPLANT EXCHANGE;  Surgeon:  Dillingham, Alena Bills, DO;  Location: [facility] OR;  Service: Plastics;  Laterality: Left;   BREAST IMPLANT REMOVAL Right 02/05/2020   Procedure: REMOVAL RIGHT BREAST IMPLANT AND CAPSULECTOMY;  Surgeon: Griselda Miner, MD;  Location: [facility];  Service: General;  Laterality: Right;   BREAST LUMPECTOMY Right 02/05/2020   Procedure: RIGHT BREAST CENTRAL LUMPECTOMY;  Surgeon: Griselda Miner, MD;  Location: [facility];  Service: General;   Laterality: Right;   BREAST RECONSTRUCTION WITH PLACEMENT OF TISSUE EXPANDER AND FLEX HD (ACELLULAR HYDRATED DERMIS) Right 01/20/2021   Procedure: BREAST RECONSTRUCTION WITH PLACEMENT OF TISSUE EXPANDER AND FLEX HD (ACELLULAR HYDRATED DERMIS);  Surgeon: Peggye Form, DO;  Location: [facility] OR;  Service: Plastics;  Laterality: Right;  2 hours   CARPAL TUNNEL RELEASE Right 2006   COLONOSCOPY     DILATION AND CURETTAGE OF UTERUS  1970s X 2-3   ELECTROPHYSIOLOGIC STUDY  1994 X 2;2001   "to see it it was sustained VT; cause thyroid levels were causing arrhythmias"   EYE SURGERY  12/2020   cataracts   ICD IMPLANT N/A 12/28/2022   Procedure: ICD IMPLANT;  Surgeon: [name], MD;  Location: [facility] INVASIVE CV LAB;  Service: Cardiovascular;  Laterality: N/A;   LAPAROSCOPIC CHOLECYSTECTOMY     MASTECTOMY Bilateral 1992   "subcutaneous"   MELANOMA EXCISION Left 2001   "ankle, stage I"   PLACEMENT OF BREAST IMPLANTS Bilateral 1992   [id number]   PVC ABLATION N/A 12/23/2022   Procedure: PVC ABLATION;  Surgeon: Regan Lemming, MD;  Location: [facility];  Service: Cardiovascular;  Laterality: N/A;   REMOVAL OF TISSUE EXPANDER AND PLACEMENT OF IMPLANT Right 05/05/2021   Procedure: REMOVAL OF TISSUE EXPANDER AND PLACEMENT OF IMPLANT;  Surgeon: Peggye Form, DO;  Location: [facility];  Service: Plastics;  Laterality: Right;   TOTAL THYROIDECTOMY  12/1999   "cancer"   VENTRICULAR ABLATION SURGERY  2011   ventricular tchycardia     Medications Prior to Admission: Prior to Admission medications   Medication Sig Start Date End Date Taking? Authorizing Provider  acetaminophen (TYLENOL) 500 MG tablet Take 1,000 mg by mouth every 6 (six) hours as needed for mild pain or moderate pain.   Yes [provider]  ALPRAZolam Prudy Feeler) 0.5 MG tablet Take 1 tablet (0.5 mg total) by mouth at bedtime as needed for sleep Patient taking differently: Take 0.25 mg by mouth at bedtime. 08/12/23  Yes    apixaban (ELIQUIS) 5 MG TABS tablet Take 1 tablet (5 mg total) by mouth 2 (two) times daily. 02/19/23  Yes [name], MD  Ascorbic Acid (VITAMIN C) 1000 MG tablet Take 1,000 mg by mouth daily.   Yes [provider]  B Complex-C (SUPER B COMPLEX PO) Take 1 capsule by mouth at bedtime.   Yes [provider]  beta carotene 40981 UNIT capsule Take 10,000 Units by mouth at bedtime.   Yes [provider]  Biotin 5 MG CAPS Take 5 mg by mouth at bedtime.    Yes [provider]  Calcium 600-200 MG-UNIT tablet Take 1 tablet by mouth daily.   Yes [provider]  cetirizine (ZYRTEC) 10 MG tablet Take 10 mg by mouth at bedtime.   Yes [provider]  Cholecalciferol (VITAMIN D) 125 MCG (5000 UT) CAPS Take 5,000 Units by mouth daily.   Yes [provider]  Coenzyme Q10 (COQ-10) 100 MG CAPS Take 100 mg by mouth  daily.   Yes [provider]  Digestive Enzyme CAPS Take 1 capsule by mouth daily as needed (heartburn).   Yes [provider]  escitalopram (LEXAPRO) 20 MG tablet Take 1 tablet (20 mg total) by mouth daily. 12/23/22  Yes   Evolocumab (REPATHA SURECLICK) 140 MG/ML SOAJ Inject 140 mg into the skin every 14 (fourteen) days. 10/27/22  Yes Camnitz, Andree Coss, MD  ezetimibe (ZETIA) 10 MG tablet Take 1 tablet (10 mg total) by mouth daily. 08/12/23  Yes   famotidine (PEPCID) 20 MG tablet Take 20 mg by mouth as needed for heartburn or indigestion.   Yes [provider]  fluticasone (FLONASE) 50 MCG/ACT nasal spray Place 1 spray into both nostrils daily as needed for allergies. 01/31/18  Yes [provider]  furosemide (LASIX) 20 MG tablet Take 20 mg by mouth daily as needed for fluid (1-2lb weight gain overnight).   Yes [provider]  Magnesium ([employer]S TRIPLE MAGNESIUM COMPLEX) 400 MG CAPS Take 400 mg by mouth daily.   Yes [provider]  MANGANESE PO Take 40 mg by mouth at bedtime.   Yes  [provider]  Melatonin 10 MG TABS Take 10 mg by mouth at bedtime.    Yes [provider]  Menatetrenone (VITAMIN K2) 100 MCG TABS Take 100 mcg by mouth daily.   Yes [provider]  methocarbamol (ROBAXIN) 500 MG tablet Take 1 tablet (500 mg total) by mouth every 8 (eight) hours as needed for neck pain 09/29/23  Yes   omeprazole (PRILOSEC) 40 MG capsule Take 1 capsule (40 mg total) by mouth every morning 30 minutes before breakfast 08/05/23  Yes   Polyvinyl Alcohol (LIQUID TEARS OP) Place 1 drop into both eyes daily as needed (dry eyes).   Yes [provider]  sotalol (BETAPACE) 160 MG tablet Take 1 tablet (160 mg total) by mouth 2 (two) times daily. 10/05/23  Yes Camnitz, Will Daphine Deutscher, MD  thyroid (ARMOUR) 120 MG tablet Take 120 mg by mouth daily before breakfast.   Yes [provider]  zinc gluconate 50 MG tablet Take 50 mg by mouth daily.   Yes [provider]  hydrocortisone cream 1 % Apply 1 Application topically daily as needed for itching.    [provider]  magnesium oxide (MAG-OX) 400 MG tablet Take 1 tablet (400 mg total) by mouth daily. Patient not taking: Reported on 09/30/2023 12/29/22   Sherie Don, NP  potassium chloride SA (KLOR-CON M) 20 MEQ tablet Take 1 tablet (20 mEq total) by mouth 2 (two) times daily. Patient taking differently: Take 20 mEq by mouth daily as needed (Low potassium). 12/29/22   Sherie Don, NP  Simethicone 125 MG CAPS Take 250 mg by mouth daily as needed (bloating).    [provider]  triamcinolone (KENALOG) 0.025 % cream Apply 1 Application topically 2 (two) times daily as needed (rash).    [provider]     Allergies:    Allergies  Allergen Reactions   Quinidine Palpitations    REACTION: increased heart rate   Statins     Muscle pain   Tape     If left on long enough,will tear skin    Amoxicillin Itching and Rash   Atenolol Hives   Dofetilide Other (See Comments)     Elevated QTCs   Mexiletine Hcl Palpitations    Dizzy and extreme tiredness   Nadolol Hives   Vicodin  [Hydrocodone-Acetaminophen] Swelling    Facial  redness and swelling    Social History:   Social History   Socioeconomic History   Marital status: Married    Spouse name: Education officer, community (Spouse)   Number of children: Not on file   Years of education: Not on file   Highest education level: Not on file  Occupational History   Occupation: Adult nurse GI    Employer: [employer]  Tobacco Use   Smoking status: Never    Passive exposure: Never   Smokeless tobacco: Never  Vaping Use   Vaping status: Never Used  Substance and Sexual Activity   Alcohol use: No   Drug use: No   Sexual activity: Yes  Other Topics Concern   Not on file  Social History Narrative   Married   Social Determinants of Health   Financial Resource Strain: Not on file  Food Insecurity: No Food Insecurity (12/26/2022)   Hunger Vital Sign    Worried About Running Out of Food in the Last Year: Never true    Ran Out of Food in the Last Year: Never true  Transportation Needs: No Transportation Needs (12/26/2022)   PRAPARE - Administrator, Civil Service (Medical): No    Lack of Transportation (Non-Medical): No  Physical Activity: Not on file  Stress: Not on file  Social Connections: Not on file  Intimate Partner Violence: Not At Risk (12/26/2022)   Humiliation, Afraid, Rape, and Kick questionnaire    Fear of Current or Ex-Partner: No    Emotionally Abused: No    Physically Abused: No    Sexually Abused: No    Family History:   The patient's family history includes Arrhythmia in her brother; Basal cell carcinoma (age of onset: 47) in her mother; Breast cancer in an other family member; Breast cancer (age of onset: 56) in her maternal grandmother; Cancer in her maternal aunt; Heart attack in her brother; Heart disease in her brother; Heart disease (age of onset: 57) in her father; Hyperlipidemia  (age of onset: 22) in her mother; Hypertension in her brother; Leukemia in her maternal aunt; Lung cancer (age of onset: 28) in her maternal aunt; Osteoporosis in her mother; Other in her brother, father, and paternal aunt; Squamous cell carcinoma (age of onset: 91) in her brother. There is no history of Thyroid cancer.    ROS:  Review of Systems  Cardiovascular:  Positive for dyspnea on exertion (chronic and stable). Negative for chest pain, claudication, irregular heartbeat, leg swelling, near-syncope, orthopnea, palpitations, paroxysmal nocturnal dyspnea and syncope.  Respiratory:  Negative for shortness of breath.   Hematologic/Lymphatic: Negative for bleeding problem.  Gastrointestinal:  Positive for heartburn. Negative for dysphagia.       Physical Exam/Data:   Vitals:   10/06/23 0951 10/06/23 1055  BP: (!) 163/83   Pulse: 73   Resp: 13   Temp: 97.8 F (36.6 C) 98.3 F (36.8 C)  TempSrc: Temporal Temporal  SpO2: 98%   Weight: 67.6 kg   Height: 5\' 4"  (1.626 m)    No intake or output data in the 24 hours ending 10/06/23 1057    10/06/2023    9:51 AM 04/06/2023    1:57 PM 01/25/2023   11:12 AM  Last 3 Weights  Weight (lbs) 149 lb 149 lb 149 lb 3.2 oz  Weight (kg) 67.586 kg 67.586 kg 67.677 kg     Body mass index is 25.58 kg/m.  General:  Well nourished, well developed, in no acute distress HEENT: normal Neck: no JVD  Vascular: No carotid bruits; Distal pulses 2+ bilaterally   Cardiac:  normal S1, S2; RRR, holosystolic murmur heard at the apex, no gallops or rubs, ICD site is clean dry and intact. Lungs:  clear to auscultation bilaterally, no wheezing, rhonchi or rales  Abd: soft, nontender, no hepatomegaly  Ext: no edema Musculoskeletal:  No deformities, BUE and BLE strength normal and equal Skin: warm and dry  Neuro:  CNs 2-12 intact, no focal abnormalities noted Psych:  Normal affect    EKG: No new ECGs.  Relevant CV Studies: Sinus rhythm on  telemetry  Echocardiogram 09/20/2023:  1. Left ventricular ejection fraction, by estimation, is 55 to 60%. The  left ventricle has normal function. The left ventricle has no regional  wall motion abnormalities. There is mild left ventricular hypertrophy.  Left ventricular diastolic parameters  were normal. Elevated left ventricular end-diastolic pressure. The average  left ventricular global longitudinal strain is -15.0 %. The global  longitudinal strain is abnormal.   2. Right ventricular systolic function is normal. The right ventricular  size is normal. There is mildly elevated pulmonary artery systolic  pressure.   3. Left atrial size was severely dilated.   4. The mitral valve is myxomatous. Moderate to severe mitral valve  regurgitation. No evidence of mitral stenosis. There is prolapse of both  leaflets of the mitral valve.   5. The aortic valve is tricuspid. There is mild thickening of the aortic  valve. Aortic valve regurgitation is moderate. No aortic stenosis is  present.   6. Aortic Normal DTA.   7. The inferior vena cava is normal in size with greater than 50%  respiratory variability, suggesting right atrial pressure of 3 mmHg.   Laboratory Data:  High Sensitivity Troponin:  No results for input(s): "TROPONINIHS" in the last 720 hours.    Chemistry Recent Labs  Lab 10/01/23 1350  NA 141  K 4.1  CL 103  CO2 23  GLUCOSE 119*  BUN 18  CREATININE 0.62  CALCIUM 9.3    No results for input(s): "PROT", "ALBUMIN", "AST", "ALT", "ALKPHOS", "BILITOT" in the last 168 hours. Lipids No results for input(s): "CHOL", "TRIG", "HDL", "LABVLDL", "LDLCALC", "CHOLHDL" in the last 168 hours. Hematology Recent Labs  Lab 10/01/23 1350  WBC 5.1  RBC 4.43  HGB 13.7  HCT 41.4  MCV 94  MCH 30.9  MCHC 33.1  RDW 12.3  PLT 202   Thyroid No results for input(s): "TSH", "FREET4" in the last 168 hours. BNPNo results for input(s): "BNP", "PROBNP" in the last 168 hours.  DDimer  No results for input(s): "DDIMER" in the last 168 hours.   Radiology/Studies:  No results found.   Assessment and Plan:   Mitral valve disease Paroxysmal atrial fibrillation Hypertension. Hyperlipidemia. History of ventricular tachycardia status post ICD Remote history of dysphagia GERD  Patient presents today for an elective transesophageal echocardiogram to further evaluate her valvular heart disease.  More specifically she has a history of myxomatous mitral valve and her mitral regurgitation has been followed clinically and with transthoracic echocardiogram.  Her most recent echocardiogram from November 2024 notes moderate to severe MR with left atrial dilatation.  She was referred to transesophageal echocardiogram for further guidance.  Clinically she is asymptomatic with regards to no chest pain to suggest ACS and not in acute heart failure.  She was being worked up for EGD as she had 1 episode of dysphagia back in September 2024.  No prior history of dilatations or strictures based on  EMR records available to me.  Her symptoms have not resurfaced since September 2024.  And when they did occur in September they improved w/ subsequent under glycerin tablets and subsequently symptoms resolved.  She notes taking one of her husbands sublingual nitroglycerin tablets which helped the discomfort.  Reviewed the risks, benefits, and alternatives to transesophageal echocardiogram. Given her history in September 2024 recommended not proceeding forward with transesophageal echocardiogram and to complete the GI evaluation to confirm that there are no strictures or the need for dilatation.  However, patient is reluctant that this was 1 episode and her symptoms have not resurfaced after starting PPIs.  I also had her seen by her primary cardiologist Dr. Ladona Ridgel in the Cath Lab holding prior to the TEE today.  They also had similar discussion and patient endorses proceeding forward with transesophageal  echocardiogram.  She understands very well that if there is any resistance with probe insertion the procedure will be aborted.  She is agreeable with the plan of care.  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  She is alert oriented x 3 and has capacity to make informed decisions.  After careful review of history and examination, the risks, benefits of transesophageal echocardiogram, and alternatives have been explained to the patient. Complications include but not limited to esophageal perforation (rare), gastrointestinal bleeding (rare), cardiac arrhythmia which can include cardiac arrest and death (rare), pharyngeal irritation / discomfort with swallowing / hematoma, methemoglobinemia, bronchospasm, transient hypoxia, nonsustained ventricular tachycardia, transient atrial fibrillation, minimal hemoptysis, vomiting, hypotension, respiratory compromise, reaction to medications, unavoidable damage to teeth and gums, aspiration pneumonia  were reviewed with the patient.  Patient voices understands, provides verbal feedback, questions answered, and patient  wishes to proceed with the procedure.  For questions or updates, please contact [facility] Please consult [email or web address] for contact info under     Signed, Tessa Lerner, DO  10/06/2023 10:57 AM

## 2023-10-06 NOTE — Discharge Instructions (Signed)

## 2023-10-06 NOTE — CV Procedure (Addendum)
    TRANSESOPHAGEAL ECHOCARDIOGRAM   NAME:  Patricia Alvarado    MRN: [id number] DOB:  1945-12-01    ADMIT DATE: 10/06/2023  INDICATIONS: Mitral valve disease  PROCEDURE:   Informed consent was obtained prior to the procedure.  The risks, benefits and alternatives for the procedure were discussed and the patient comprehended these risks.   Risks include, but are not limited to, cough, sore throat, vomiting, nausea, somnolence, esophageal and stomach trauma or perforation, bleeding, low blood pressure, aspiration, pneumonia, infection, trauma to the teeth and death.    Procedural time out performed. The oropharynx was anesthetized.  Anesthesia was administered by anesthesia team (see their records).  The transesophageal probe was inserted in the esophagus without difficulty and multiple views were obtained.  Transthoracic images were not obtained given her subjective history of dysphagia back in September 2024, history of radiation due to breast cancer.  COMPLICATIONS:    There were no immediate complications.  KEY FINDINGS:  Degenerative mitral valve with bileaflet prolapse. Severe mitral regurgitation due to multiple jets and blunted pulmonary veins. No PFO No left atrial appendage thrombus or smoke Full report to follow. Results conveyed to the patient post procedure.  Will convey results to ordering physician Dr. Ladona Ridgel later today. Patient is advised to complete her GI workup for dysphagia.  Tessa Lerner, DO, [facility]  [address] [place], [place] [address] Pager: [phone] Office: [phone] 10/06/23 12:20 PM

## 2023-10-06 NOTE — Transfer of Care (Signed)
Immediate Anesthesia Transfer of Care Note  Patient: Patricia Alvarado  Procedure(s) Performed: TRANSESOPHAGEAL ECHOCARDIOGRAM  Patient Location: PACU and Cath Lab  Anesthesia Type:MAC  Level of Consciousness: awake, alert , oriented, and patient cooperative  Airway & Oxygen Therapy: Patient Spontanous Breathing  Post-op Assessment: Report given to RN and Post -op Vital signs reviewed and stable  Post vital signs: Reviewed and stable  Last Vitals:  Vitals Value Taken Time  BP    Temp    Pulse 61 10/06/23 1154  Resp 19 10/06/23 1154  SpO2 96 % 10/06/23 1154  Vitals shown include unfiled device data.  Last Pain:  Vitals:   10/06/23 1055  TempSrc: Temporal  PainSc: 0-No pain         Complications: No notable events documented.

## 2023-10-07 ENCOUNTER — Encounter: Payer: Self-pay | Admitting: Internal Medicine

## 2023-10-08 NOTE — Telephone Encounter (Signed)
Received another clearance from surgeons office.  Looks like pt had to get echo, done on Dec 11.  Need to send to surgeons office if pt is cleared from a cardiac stand point.

## 2023-10-08 NOTE — Telephone Encounter (Signed)
   Patient Name: Patricia Alvarado  DOB: 1946/07/28 MRN: [id number]  Primary Cardiologist: None  Chart reviewed as part of pre-operative protocol coverage. Given past medical history and time since last visit, based on ACC/AHA guidelines, [name] is at acceptable risk for the planned procedure without further cardiovascular testing. Echo completed with severe MR. Should not keep from having EDG. Ongoing work up is pending.   Per office protocol, patient can hold Eliquis for 2 days prior to procedure.  Please resume Eliquis as soon as possible postprocedure, at the discretion of the surgeon.   The patient was advised that if she develops new symptoms prior to surgery to contact our office to arrange for a follow-up visit, and she verbalized understanding.  I will route this recommendation to the requesting party via Epic fax function and remove from pre-op pool.  Please call with questions.  [name], NP 10/08/2023, 7:36 AM

## 2023-10-08 NOTE — Anesthesia Postprocedure Evaluation (Signed)
Anesthesia Post Note  Patient: Patricia Alvarado  Procedure(s) Performed: TRANSESOPHAGEAL ECHOCARDIOGRAM     Patient location during evaluation: PACU Anesthesia Type: MAC Level of consciousness: awake and alert Pain management: pain level controlled Vital Signs Assessment: post-procedure vital signs reviewed and stable Respiratory status: spontaneous breathing, nonlabored ventilation, respiratory function stable and patient connected to nasal cannula oxygen Cardiovascular status: stable and blood pressure returned to baseline Postop Assessment: no apparent nausea or vomiting Anesthetic complications: no   No notable events documented.  Last Vitals:  Vitals:   10/06/23 1210 10/06/23 1220  BP: (!) 138/59 (!) 147/69  Pulse: 61 66  Resp: 18 15  Temp:    SpO2: 98% 97%    Last Pain:  Vitals:   10/06/23 1155  TempSrc: Temporal  PainSc: 0-No pain                 Solimar Maiden S

## 2023-10-11 ENCOUNTER — Telehealth: Payer: Self-pay | Admitting: Internal Medicine

## 2023-10-11 NOTE — Telephone Encounter (Signed)
Per Clydie Braun ar Dr Misenheimer's office they need Cardiac Clearance from [place] to have an Endoscopy

## 2023-10-11 NOTE — Telephone Encounter (Signed)
I will forward to preop APP to see if the pt has been cleared.

## 2023-10-11 NOTE — Telephone Encounter (Signed)
Patient called to follow-up on the status of her pre-op clearance.

## 2023-10-15 DIAGNOSIS — Z950 Presence of cardiac pacemaker: Secondary | ICD-10-CM | POA: Diagnosis not present

## 2023-10-15 DIAGNOSIS — R1310 Dysphagia, unspecified: Secondary | ICD-10-CM | POA: Diagnosis not present

## 2023-10-15 DIAGNOSIS — K222 Esophageal obstruction: Secondary | ICD-10-CM | POA: Diagnosis not present

## 2023-10-15 DIAGNOSIS — K219 Gastro-esophageal reflux disease without esophagitis: Secondary | ICD-10-CM | POA: Diagnosis not present

## 2023-10-15 DIAGNOSIS — K317 Polyp of stomach and duodenum: Secondary | ICD-10-CM | POA: Diagnosis not present

## 2023-10-15 DIAGNOSIS — K449 Diaphragmatic hernia without obstruction or gangrene: Secondary | ICD-10-CM | POA: Diagnosis not present

## 2023-10-21 ENCOUNTER — Encounter: Payer: Self-pay | Admitting: Internal Medicine

## 2023-10-29 ENCOUNTER — Other Ambulatory Visit: Payer: Self-pay

## 2023-10-29 ENCOUNTER — Other Ambulatory Visit: Payer: Self-pay | Admitting: Internal Medicine

## 2023-10-29 ENCOUNTER — Other Ambulatory Visit (HOSPITAL_COMMUNITY): Payer: Self-pay

## 2023-10-29 MED ORDER — APIXABAN 5 MG PO TABS
5.0000 mg | ORAL_TABLET | Freq: Two times a day (BID) | ORAL | 1 refills | Status: DC
  Filled 2023-10-29: qty 180, 90d supply, fill #0
  Filled 2024-02-07: qty 180, 90d supply, fill #1

## 2023-10-29 NOTE — Telephone Encounter (Signed)
 Prescription refill request for Eliquis received. Indication:afib Last office visit:6/24 Scr:0.62  12/24 Age: 78 Weight:67.6  kg  Prescription refilled

## 2023-10-30 ENCOUNTER — Other Ambulatory Visit (HOSPITAL_COMMUNITY): Payer: Self-pay

## 2023-11-01 ENCOUNTER — Encounter: Payer: Self-pay | Admitting: Internal Medicine

## 2023-11-01 ENCOUNTER — Other Ambulatory Visit (HOSPITAL_COMMUNITY): Payer: Self-pay

## 2023-11-02 ENCOUNTER — Other Ambulatory Visit: Payer: Self-pay

## 2023-11-02 DIAGNOSIS — I059 Rheumatic mitral valve disease, unspecified: Secondary | ICD-10-CM

## 2023-11-02 NOTE — Telephone Encounter (Signed)
 Addressed in the other MyChart message

## 2023-11-02 NOTE — Telephone Encounter (Signed)
 Referral placed.

## 2023-11-03 ENCOUNTER — Encounter: Payer: Self-pay | Admitting: Internal Medicine

## 2023-11-04 NOTE — Telephone Encounter (Signed)
 Spoke with Pt. Reviewed instructions for Cath and informed her of her appt tomorrow with Robin Searing NP. Pt was told to call if she had any other questions. Pt stated understanding

## 2023-11-04 NOTE — Progress Notes (Deleted)
 Cardiology Office Note    Patient Name: Patricia Alvarado Date of Encounter: 11/04/2023  Primary Care Provider:  Erick Greig LABOR, NP Primary Cardiologist:  None Primary Electrophysiologist: Danelle Birmingham, MD   Past Medical History    Past Medical History:  Diagnosis Date   A-fib [facility])    Arthritis    thumb   Basal cell carcinoma 2001   forehead, between eyebrows right arm (2022)   Breast cancer (HCC)    Carcinoma of thyroid  gland (HCC) 2001   Chronic lower back pain    worse is across my hips (05/26/2016)   Depression    Dyslipidemia    Dyspnea    Family history of breast cancer    Family history of kidney cancer    Family history of leukemia    Family history of nonmelanoma skin cancer    Fibrocystic breast    Fibromyalgia    GERD (gastroesophageal reflux disease)    Heart murmur    History of blood transfusion 1992   after subcutaneous mastectomies   Hypertension    not on any medications   Hypothyroidism    Malignant melanoma of left ankle (HCC) 2001   Migraine    visual; 2-3 times/year (05/26/2016)   Mitral valve prolapse    PONV (postoperative nausea and vomiting)    Ventricular tachycardia (HCC)    Hx of, controlled on sotalol  therapy    History of Present Illness  Patricia Alvarado is a 78 y.o. female with a PMH of paroxysmal AF (on Eliquis ), breast CA, HLD, HTN, hypothyroidism, MVP, VT s/p Medtronic ICD 12/2022 who presents today for preheart cath examination.  Patricia Alvarado has been followed by Dr. Birmingham since 2010 for management of NSVT and PVCs.  She underwent AF ablation in 2022 as well as PVC ablation in 11/2022.  She underwent MRI that revealed mitral annular disjunction and prolonged VT she underwent ICD placement 12/2022 for secondary prevention in the setting of NSVT and PVCs.  She required cardiac clearance for EGD procedure and was advised to complete updated TEE to evaluate for valvular heart disease.  She underwent procedure on 10/06/2023 that revealed  degenerative bilateral leaflet prolapse of the mitral valve with severe MR and grade 3 DD and elevated left atrial pressure.  She was referred to Dr. Maryjane to discuss surgical MV that will take place on 11/25/2023.  She was recommended to undergo [facility] for further evaluation and workup.  She presents today for preprocedure visit.  during today's visit the patient reports*** .  Patient denies chest pain, palpitations, dyspnea, PND, orthopnea, nausea, vomiting, dizziness, syncope, edema, weight gain, or early satiety.  ***Notes: 11/11/2023 -Last ischemic evaluation: -Last echo: -Interim ED visits: Review of Systems  Please see the history of present illness.    All other systems reviewed and are otherwise negative except as noted above.  Physical Exam    Wt Readings from Last 3 Encounters:  10/06/23 149 lb (67.6 kg)  04/06/23 149 lb (67.6 kg)  01/25/23 149 lb 3.2 oz (67.7 kg)   CD:Uyzmz were no vitals filed for this visit.,There is no height or weight on file to calculate BMI. GEN: Well nourished, well developed in no acute distress Neck: No JVD; No carotid bruits Pulmonary: Clear to auscultation without rales, wheezing or rhonchi  Cardiovascular: Normal rate. Regular rhythm. Normal S1. Normal S2.   Murmurs: There is no murmur.  ABDOMEN: Soft, non-tender, non-distended EXTREMITIES:  No edema; No deformity   EKG/LABS/ Recent Cardiac Studies  ECG personally reviewed by me today - ***  Risk Assessment/Calculations:   {Does this patient have ATRIAL FIBRILLATION?:[phone]}      Lab Results  Component Value Date   WBC 5.1 10/01/2023   HGB 13.7 10/01/2023   HCT 41.4 10/01/2023   MCV 94 10/01/2023   PLT 202 10/01/2023   Lab Results  Component Value Date   CREATININE 0.62 10/01/2023   BUN 18 10/01/2023   NA 141 10/01/2023   K 4.1 10/01/2023   CL 103 10/01/2023   CO2 23 10/01/2023   Lab Results  Component Value Date   CHOL 155 08/21/2022   HDL 74 08/21/2022   LDLCALC 64  08/21/2022   TRIG 94 08/21/2022   CHOLHDL 2.1 08/21/2022    No results found for: HGBA1C Assessment & Plan    1.  Severe MR:  2.  History of VT: -s/p Medtronic ICD for secondary prevention placed 12/2022  3.  Paroxysmal AF  4.  History of PVCs      Disposition: Follow-up with None or APP in *** months {Are you ordering a CV Procedure (e.g. stress test, cath, DCCV, TEE, etc)?   Press F2        :[id number]}   Signed, Wyn Raddle, Jackee Shove, NP 11/04/2023, 12:21 PM [facility]

## 2023-11-05 ENCOUNTER — Ambulatory Visit: Payer: PPO | Admitting: Nurse Practitioner

## 2023-11-06 ENCOUNTER — Other Ambulatory Visit (HOSPITAL_COMMUNITY): Payer: Self-pay

## 2023-11-08 ENCOUNTER — Ambulatory Visit: Payer: PPO | Attending: Cardiology | Admitting: Cardiology

## 2023-11-08 VITALS — BP 142/60 | HR 73 | Ht 64.0 in | Wt 151.4 lb

## 2023-11-08 DIAGNOSIS — Z01812 Encounter for preprocedural laboratory examination: Secondary | ICD-10-CM

## 2023-11-08 DIAGNOSIS — I480 Paroxysmal atrial fibrillation: Secondary | ICD-10-CM | POA: Diagnosis not present

## 2023-11-08 DIAGNOSIS — I340 Nonrheumatic mitral (valve) insufficiency: Secondary | ICD-10-CM

## 2023-11-08 DIAGNOSIS — I059 Rheumatic mitral valve disease, unspecified: Secondary | ICD-10-CM

## 2023-11-08 DIAGNOSIS — I472 Ventricular tachycardia, unspecified: Secondary | ICD-10-CM

## 2023-11-08 NOTE — H&P (View-Only) (Signed)
Cardiology Office Note:  .   Date:  11/08/2023  ID:  Patricia Alvarado, DOB Jun 18, 1946, MRN [id number] PCP: [name], NP  [facility] Providers Cardiologist:  Truett Mainland, MD PCP: [name], NP  Chief Complaint  Patient presents with   Mitral Regurgitation      History of Present Illness: .    Patricia Alvarado is a 78 y.o. female with hypertension, hyperlipidemia, hypothyroidism, h/o breast cancer and radiation, PAF, s/p ICD for VT, now with severe MR due to mitral valve prolapse.  Patient is going to undergo mitral valve surgery by Dr. Leafy Ro in the near future. As part of the pre-op workup, she is already scheduled to undergo left and right heart catheterization.  Patient reports progressively worsening exertional dyspnea.  Vitals:   11/08/23 1520  BP: (!) 142/60  Pulse: 73  SpO2: 97%     ROS:  Review of Systems  Cardiovascular:  Positive for dyspnea on exertion. Negative for chest pain, leg swelling, palpitations and syncope.     Studies Reviewed: Marland Kitchen        EKG 11/08/2023: Normal sinus rhythm Normal ECG When compared with ECG of 29-Dec-2022 05:47, Sinus rhythm has replaced Electronic atrial pacemaker     TEE 09/2023: Native mitral valve, predominantly degenerative, bileaflet prolapse but predominantly posterior leaflet (involving the P1, P2, P3 scallops and in some views A2 scallop as well).  Multiple jets centrally directed consistent with severe mitral regurgitation. No valvular stenosis. Quantitative evaluation likely to underestimate the severity of MR due to the presence of multiple jets. Clinical correlation is required. Sunit Tolia  Independently interpreted 09/2023: Hb 13.7 Cr 0.62  07/2022: Chol 155, TG 94, HDL 74, LDL 64  Risk Assessment/Calculations:    CHA2DS2-VASc Score = 4  his indicates a 4.8% annual risk of stroke. The patient's score is based upon: CHF History: 0 HTN History: 1 Diabetes History: 0 Stroke  History: 0 Vascular Disease History: 0 Age Score: 2 Gender Score: 1     Physical Exam:   Physical Exam Vitals and nursing note reviewed.  Constitutional:      General: She is not in acute distress. Neck:     Vascular: No JVD.  Cardiovascular:     Rate and Rhythm: Normal rate and regular rhythm.     Heart sounds: Murmur heard.     High-pitched blowing holosystolic murmur is present with a grade of 3/6 at the apex.  Pulmonary:     Effort: Pulmonary effort is normal.     Breath sounds: Normal breath sounds. No wheezing or rales.  Musculoskeletal:     Right lower leg: No edema.     Left lower leg: No edema.      VISIT DIAGNOSES:   ICD-10-CM   1. VT (ventricular tachycardia) (HCC)  I47.20 EKG 12-Lead    CBC w/Diff    Basic metabolic panel    Basic metabolic panel    CBC w/Diff    2. Nonrheumatic mitral valve regurgitation  I34.0 CBC w/Diff    Basic metabolic panel    Basic metabolic panel    CBC w/Diff    3. Pre-procedure lab exam  Z01.812 CBC w/Diff    Basic metabolic panel    Basic metabolic panel    CBC w/Diff    4. Paroxysmal atrial fibrillation (HCC)  I48.0        ASSESSMENT AND PLAN: .    Patricia Alvarado is a 78 y.o. female with hypertension, hyperlipidemia,  hypothyroidism, h/o breast cancer and radiation, PAF, s/p ICD for VT, now with severe MR due to mitral valve prolapse.  Symptomatic mitral regurgitation: Severe moderate mitral prolapse.  Plans for mitral repair in the near future. Schedule for left and right heart catheterization for preop evaluation.  I have reviewed the risks, indications, and alternatives to cardiac catheterization, possible angioplasty, and stenting with the patient. Risks include but are not limited to bleeding, infection, vascular injury, stroke, myocardial infection, arrhythmia, kidney injury, radiation-related injury in the case of prolonged fluoroscopy use, emergency cardiac surgery, and death. The patient understands the  risks of serious complication is 1-2 in 1000 with diagnostic cardiac cath and 1-2% or less with angioplasty/stenting.    Hold Eliquis for chronic catheter and stent as per protocol. Continue rest of the medications unchanged.    Informed Consent   Shared Decision Making/Informed Consent The risks [stroke (1 in 1000), death (1 in 1000), kidney failure [usually temporary] (1 in 500), bleeding (1 in 200), allergic reaction [possibly serious] (1 in 200)], benefits (diagnostic support and management of coronary artery disease) and alternatives of a cardiac catheterization were discussed in detail with Ms. Sedano and she is willing to proceed.       F/u w/Dr. Leafy Ro  Signed, Elder Negus, MD

## 2023-11-08 NOTE — Patient Instructions (Signed)
 Medication Instructions:   Your physician recommends that you continue on your current medications as directed. Please refer to the Current Medication list given to you today.  *If you need a refill on your cardiac medications before your next appointment, please call your pharmacy*   Lab Work:  [facility] FIRST FLOOR OF THIS BUILDING--CBC W DIFF AND BMET  If you have labs (blood work) drawn today and your tests are completely normal, you will receive your results only by: MyChart Message (if you have MyChart) OR A paper copy in the mail If you have any lab test that is abnormal or we need to change your treatment, we will call you to review the results.   Testing/Procedures:      [facility] at [facility] [address] [place], [place]  [address] Phone:  [phone]   Fax:  [phone]     [facility] A DEPT OF [facility] AT [facility] [address] [place], [address] [place] [address] Dept: [phone] Loc: [phone]   Keia Rask                       11/04/2023   You are scheduled for a Cardiac Catheterization on Thursday, January 16 with Dr. Lonni Cash.   1. Please arrive at the [facility] (Main Entrance A) at [facility]: [address] [place], [place] [address] at 5:30 AM (This time is 2 hour(s) before your procedure to ensure your preparation).    Free valet parking service is available. You will check in at ADMITTING. The support person will be asked to wait in the waiting room.  It is OK to have someone drop you off and come back when you are ready to be discharged.     Special note: Every effort is made to have your procedure done on time. Please understand that emergencies sometimes delay scheduled procedures.   2. Diet: Do not eat solid foods after midnight.  The patient may have clear liquids until 5am upon the day of the  procedure.   3. Labs: When you come in for your Appointment on 11/05/23 you will need to go downstairs to [facility] on the first floor of our building for lab work. You do not need to be fasting.   4. Medication instructions in preparation for your procedure:    Contrast Allergy: No      Stop taking Eliquis  (Apixiban) on Tuesday, January 14.   Stop taking, Lasix  (Furosemide )  Wednesday, January 15,   On the morning of your procedure, take your Aspirin  81 mg and any morning medicines NOT listed above.  You may use sips of water.   5. Plan to go home the same day, you will only stay overnight if medically necessary. 6. Bring a current list of your medications and current insurance cards. 7. You MUST have a responsible person to drive you home. 8. Someone MUST be with you the first 24 hours after you arrive home or your discharge will be delayed. 9. Please wear clothes that are easy to get on and off and wear slip-on shoes.   Thank you for allowing us  to care for you!   -- [facility]   [facility] at [facility]  Idell Jenkins [place], [phone]          Follow-Up:  AS ALREADY SCHEDULED

## 2023-11-08 NOTE — Progress Notes (Signed)
 Cardiology Office Note:  .   Date:  11/08/2023  ID:  Patricia Alvarado, DOB Jun 18, 1946, MRN [id number] PCP: [name], NP  [facility] Providers Cardiologist:  Truett Mainland, MD PCP: [name], NP  Chief Complaint  Patient presents with   Mitral Regurgitation      History of Present Illness: .    Patricia Alvarado is a 78 y.o. female with hypertension, hyperlipidemia, hypothyroidism, h/o breast cancer and radiation, PAF, s/p ICD for VT, now with severe MR due to mitral valve prolapse.  Patient is going to undergo mitral valve surgery by Dr. Leafy Ro in the near future. As part of the pre-op workup, she is already scheduled to undergo left and right heart catheterization.  Patient reports progressively worsening exertional dyspnea.  Vitals:   11/08/23 1520  BP: (!) 142/60  Pulse: 73  SpO2: 97%     ROS:  Review of Systems  Cardiovascular:  Positive for dyspnea on exertion. Negative for chest pain, leg swelling, palpitations and syncope.     Studies Reviewed: Marland Kitchen        EKG 11/08/2023: Normal sinus rhythm Normal ECG When compared with ECG of 29-Dec-2022 05:47, Sinus rhythm has replaced Electronic atrial pacemaker     TEE 09/2023: Native mitral valve, predominantly degenerative, bileaflet prolapse but predominantly posterior leaflet (involving the P1, P2, P3 scallops and in some views A2 scallop as well).  Multiple jets centrally directed consistent with severe mitral regurgitation. No valvular stenosis. Quantitative evaluation likely to underestimate the severity of MR due to the presence of multiple jets. Clinical correlation is required. Sunit Tolia  Independently interpreted 09/2023: Hb 13.7 Cr 0.62  07/2022: Chol 155, TG 94, HDL 74, LDL 64  Risk Assessment/Calculations:    CHA2DS2-VASc Score = 4  his indicates a 4.8% annual risk of stroke. The patient's score is based upon: CHF History: 0 HTN History: 1 Diabetes History: 0 Stroke  History: 0 Vascular Disease History: 0 Age Score: 2 Gender Score: 1     Physical Exam:   Physical Exam Vitals and nursing note reviewed.  Constitutional:      General: She is not in acute distress. Neck:     Vascular: No JVD.  Cardiovascular:     Rate and Rhythm: Normal rate and regular rhythm.     Heart sounds: Murmur heard.     High-pitched blowing holosystolic murmur is present with a grade of 3/6 at the apex.  Pulmonary:     Effort: Pulmonary effort is normal.     Breath sounds: Normal breath sounds. No wheezing or rales.  Musculoskeletal:     Right lower leg: No edema.     Left lower leg: No edema.      VISIT DIAGNOSES:   ICD-10-CM   1. VT (ventricular tachycardia) (HCC)  I47.20 EKG 12-Lead    CBC w/Diff    Basic metabolic panel    Basic metabolic panel    CBC w/Diff    2. Nonrheumatic mitral valve regurgitation  I34.0 CBC w/Diff    Basic metabolic panel    Basic metabolic panel    CBC w/Diff    3. Pre-procedure lab exam  Z01.812 CBC w/Diff    Basic metabolic panel    Basic metabolic panel    CBC w/Diff    4. Paroxysmal atrial fibrillation (HCC)  I48.0        ASSESSMENT AND PLAN: .    Patricia Alvarado is a 78 y.o. female with hypertension, hyperlipidemia,  hypothyroidism, h/o breast cancer and radiation, PAF, s/p ICD for VT, now with severe MR due to mitral valve prolapse.  Symptomatic mitral regurgitation: Severe moderate mitral prolapse.  Plans for mitral repair in the near future. Schedule for left and right heart catheterization for preop evaluation.  I have reviewed the risks, indications, and alternatives to cardiac catheterization, possible angioplasty, and stenting with the patient. Risks include but are not limited to bleeding, infection, vascular injury, stroke, myocardial infection, arrhythmia, kidney injury, radiation-related injury in the case of prolonged fluoroscopy use, emergency cardiac surgery, and death. The patient understands the  risks of serious complication is 1-2 in 1000 with diagnostic cardiac cath and 1-2% or less with angioplasty/stenting.    Hold Eliquis for chronic catheter and stent as per protocol. Continue rest of the medications unchanged.    Informed Consent   Shared Decision Making/Informed Consent The risks [stroke (1 in 1000), death (1 in 1000), kidney failure [usually temporary] (1 in 500), bleeding (1 in 200), allergic reaction [possibly serious] (1 in 200)], benefits (diagnostic support and management of coronary artery disease) and alternatives of a cardiac catheterization were discussed in detail with Ms. Sedano and she is willing to proceed.       F/u w/Dr. Leafy Ro  Signed, Elder Negus, MD

## 2023-11-09 ENCOUNTER — Encounter: Payer: Self-pay | Admitting: Cardiology

## 2023-11-09 LAB — CBC WITH DIFFERENTIAL/PLATELET
Basophils Absolute: 0 10*3/uL (ref 0.0–0.2)
Basos: 1 %
EOS (ABSOLUTE): 0.3 10*3/uL (ref 0.0–0.4)
Eos: 4 %
Hematocrit: 42.1 % (ref 34.0–46.6)
Hemoglobin: 13.9 g/dL (ref 11.1–15.9)
Immature Grans (Abs): 0 10*3/uL (ref 0.0–0.1)
Immature Granulocytes: 0 %
Lymphocytes Absolute: 1.7 10*3/uL (ref 0.7–3.1)
Lymphs: 28 %
MCH: 30.9 pg (ref 26.6–33.0)
MCHC: 33 g/dL (ref 31.5–35.7)
MCV: 94 fL (ref 79–97)
Monocytes Absolute: 0.4 10*3/uL (ref 0.1–0.9)
Monocytes: 7 %
Neutrophils Absolute: 3.8 10*3/uL (ref 1.4–7.0)
Neutrophils: 60 %
Platelets: 216 10*3/uL (ref 150–450)
RBC: 4.5 x10E6/uL (ref 3.77–5.28)
RDW: 12.7 % (ref 11.7–15.4)
WBC: 6.3 10*3/uL (ref 3.4–10.8)

## 2023-11-09 LAB — BASIC METABOLIC PANEL
BUN/Creatinine Ratio: 37 — ABNORMAL HIGH (ref 12–28)
BUN: 23 mg/dL (ref 8–27)
CO2: 25 mmol/L (ref 20–29)
Calcium: 9.8 mg/dL (ref 8.7–10.3)
Chloride: 101 mmol/L (ref 96–106)
Creatinine, Ser: 0.63 mg/dL (ref 0.57–1.00)
Glucose: 103 mg/dL — ABNORMAL HIGH (ref 70–99)
Potassium: 4.2 mmol/L (ref 3.5–5.2)
Sodium: 141 mmol/L (ref 134–144)
eGFR: 92 mL/min/{1.73_m2} (ref >59)

## 2023-11-10 ENCOUNTER — Telehealth: Payer: Self-pay | Admitting: *Deleted

## 2023-11-10 NOTE — Telephone Encounter (Addendum)
 Cardiac Catheterization scheduled at [facility] for: Thursday November 11, 2023 7:30 AM Arrival time [facility] Main Entrance A at: 5:30 AM  Nothing to eat after midnight prior to procedure, clear liquids until 5 AM day of procedure.  Medication instructions: -Hold:  Eliquis -none 11/09/23 until post procedure  Lasix /KCl-AM of procedure-pt takes prn -Other usual morning medications can be taken with sips of water including aspirin  81 mg.  Plan to go home the same day, you will only stay overnight if medically necessary.  You must have responsible adult to drive you home.  Someone must be with you the first 24 hours after you arrive home.  Reviewed procedure instructions with patient.  Per Dr Taylor-pt should take 8 AM sotalol  dose with her to hospital and take it in the hospital when she arrives- pt aware of Dr Meridith Stanford recommendation.

## 2023-11-11 ENCOUNTER — Ambulatory Visit (HOSPITAL_COMMUNITY)
Admission: RE | Admit: 2023-11-11 | Discharge: 2023-11-11 | Disposition: A | Discharge status: Home / Self Care | Payer: PPO | Attending: Cardiovascular Disease | Admitting: Cardiovascular Disease

## 2023-11-11 ENCOUNTER — Encounter (HOSPITAL_COMMUNITY)
Admission: RE | Disposition: A | Discharge status: Home / Self Care | Payer: Self-pay | Source: Home / Self Care | Attending: Cardiovascular Disease

## 2023-11-11 ENCOUNTER — Other Ambulatory Visit: Payer: Self-pay

## 2023-11-11 DIAGNOSIS — E785 Hyperlipidemia, unspecified: Secondary | ICD-10-CM | POA: Diagnosis not present

## 2023-11-11 DIAGNOSIS — I340 Nonrheumatic mitral (valve) insufficiency: Secondary | ICD-10-CM | POA: Insufficient documentation

## 2023-11-11 DIAGNOSIS — Z923 Personal history of irradiation: Secondary | ICD-10-CM | POA: Diagnosis not present

## 2023-11-11 DIAGNOSIS — Z7901 Long term (current) use of anticoagulants: Secondary | ICD-10-CM | POA: Diagnosis not present

## 2023-11-11 DIAGNOSIS — I480 Paroxysmal atrial fibrillation: Secondary | ICD-10-CM | POA: Insufficient documentation

## 2023-11-11 DIAGNOSIS — Z95810 Presence of automatic (implantable) cardiac defibrillator: Secondary | ICD-10-CM | POA: Insufficient documentation

## 2023-11-11 DIAGNOSIS — I472 Ventricular tachycardia, unspecified: Secondary | ICD-10-CM | POA: Diagnosis not present

## 2023-11-11 DIAGNOSIS — E039 Hypothyroidism, unspecified: Secondary | ICD-10-CM | POA: Diagnosis not present

## 2023-11-11 DIAGNOSIS — I10 Essential (primary) hypertension: Secondary | ICD-10-CM | POA: Insufficient documentation

## 2023-11-11 DIAGNOSIS — I2584 Coronary atherosclerosis due to calcified coronary lesion: Secondary | ICD-10-CM | POA: Diagnosis not present

## 2023-11-11 DIAGNOSIS — I2510 Atherosclerotic heart disease of native coronary artery without angina pectoris: Secondary | ICD-10-CM | POA: Diagnosis not present

## 2023-11-11 DIAGNOSIS — I341 Nonrheumatic mitral (valve) prolapse: Secondary | ICD-10-CM | POA: Insufficient documentation

## 2023-11-11 DIAGNOSIS — Z853 Personal history of malignant neoplasm of breast: Secondary | ICD-10-CM | POA: Diagnosis not present

## 2023-11-11 HISTORY — PX: RIGHT/LEFT HEART CATH AND CORONARY ANGIOGRAPHY: CATH118266

## 2023-11-11 LAB — POCT I-STAT EG7
Acid-Base Excess: 0 mmol/L (ref 0.0–2.0)
Acid-Base Excess: 1 mmol/L (ref 0.0–2.0)
Bicarbonate: 26 mmol/L (ref 20.0–28.0)
Bicarbonate: 27 mmol/L (ref 20.0–28.0)
Calcium, Ion: 1.16 mmol/L (ref 1.15–1.40)
Calcium, Ion: 1.25 mmol/L (ref 1.15–1.40)
HCT: 35 % — ABNORMAL LOW (ref 36.0–46.0)
HCT: 36 % (ref 36.0–46.0)
Hemoglobin: 11.9 g/dL — ABNORMAL LOW (ref 12.0–15.0)
Hemoglobin: 12.2 g/dL (ref 12.0–15.0)
O2 Saturation: 66 %
O2 Saturation: 67 %
Potassium: 3.6 mmol/L (ref 3.5–5.1)
Potassium: 3.8 mmol/L (ref 3.5–5.1)
Sodium: 140 mmol/L (ref 135–145)
Sodium: 141 mmol/L (ref 135–145)
TCO2: 27 mmol/L (ref 22–32)
TCO2: 28 mmol/L (ref 22–32)
pCO2, Ven: 45.8 mm[Hg] (ref 44–60)
pCO2, Ven: 47.6 mm[Hg] (ref 44–60)
pH, Ven: 7.361 (ref 7.25–7.43)
pH, Ven: 7.361 (ref 7.25–7.43)
pO2, Ven: 36 mm[Hg] (ref 32–45)
pO2, Ven: 37 mm[Hg] (ref 32–45)

## 2023-11-11 LAB — POCT I-STAT 7, (LYTES, BLD GAS, ICA,H+H)
Acid-base deficit: 1 mmol/L (ref 0.0–2.0)
Bicarbonate: 24 mmol/L (ref 20.0–28.0)
Calcium, Ion: 1.12 mmol/L — ABNORMAL LOW (ref 1.15–1.40)
HCT: 33 % — ABNORMAL LOW (ref 36.0–46.0)
Hemoglobin: 11.2 g/dL — ABNORMAL LOW (ref 12.0–15.0)
O2 Saturation: 96 %
Potassium: 3.5 mmol/L (ref 3.5–5.1)
Sodium: 142 mmol/L (ref 135–145)
TCO2: 25 mmol/L (ref 22–32)
pCO2 arterial: 39.9 mm[Hg] (ref 32–48)
pH, Arterial: 7.388 (ref 7.35–7.45)
pO2, Arterial: 80 mm[Hg] — ABNORMAL LOW (ref 83–108)

## 2023-11-11 SURGERY — RIGHT/LEFT HEART CATH AND CORONARY ANGIOGRAPHY
Anesthesia: LOCAL

## 2023-11-11 MED ORDER — SODIUM CHLORIDE 0.9 % IV SOLN: INTRAVENOUS | Status: DC

## 2023-11-11 MED ORDER — HEPARIN (PORCINE) IN NACL 1000-0.9 UT/500ML-% IV SOLN
INTRAVENOUS | Status: DC | PRN
  Administered 2023-11-11 (×2): 500 mL

## 2023-11-11 MED ORDER — HYDRALAZINE HCL 20 MG/ML IJ SOLN: 10.0000 mg | INTRAMUSCULAR | Status: DC | PRN

## 2023-11-11 MED ORDER — MIDAZOLAM HCL 2 MG/2ML IJ SOLN
INTRAMUSCULAR | Status: DC | PRN
  Administered 2023-11-11: 2 mg via INTRAVENOUS

## 2023-11-11 MED ORDER — FENTANYL CITRATE (PF) 100 MCG/2ML IJ SOLN
INTRAMUSCULAR | Status: AC
  Filled 2023-11-11: qty 2

## 2023-11-11 MED ORDER — SODIUM CHLORIDE 0.9 % WEIGHT BASED INFUSION: 3.0000 mL/kg/h | INTRAVENOUS | Status: AC

## 2023-11-11 MED ORDER — LIDOCAINE HCL (PF) 1 % IJ SOLN
INTRAMUSCULAR | Status: DC | PRN
  Administered 2023-11-11 (×2): 2 mL
  Administered 2023-11-11: 5 mL

## 2023-11-11 MED ORDER — VERAPAMIL HCL 2.5 MG/ML IV SOLN: INTRAVENOUS | Status: DC | PRN

## 2023-11-11 MED ORDER — SODIUM CHLORIDE 0.9 % WEIGHT BASED INFUSION: 1.0000 mL/kg/h | INTRAVENOUS | Status: DC

## 2023-11-11 MED ORDER — SODIUM CHLORIDE 0.9 % IV SOLN: 250.0000 mL | INTRAVENOUS | Status: DC | PRN

## 2023-11-11 MED ORDER — SODIUM CHLORIDE 0.9% FLUSH: 3.0000 mL | INTRAVENOUS | Status: DC | PRN

## 2023-11-11 MED ORDER — LIDOCAINE HCL (PF) 1 % IJ SOLN
INTRAMUSCULAR | Status: AC
  Filled 2023-11-11: qty 30

## 2023-11-11 MED ORDER — IOHEXOL 350 MG/ML SOLN
INTRAVENOUS | Status: DC | PRN
  Administered 2023-11-11: 23 mL

## 2023-11-11 MED ORDER — ASPIRIN 81 MG PO CHEW: 81.0000 mg | CHEWABLE_TABLET | ORAL | Status: DC

## 2023-11-11 MED ORDER — VERAPAMIL HCL 2.5 MG/ML IV SOLN
INTRAVENOUS | Status: AC
  Filled 2023-11-11: qty 2

## 2023-11-11 MED ORDER — ONDANSETRON HCL 4 MG/2ML IJ SOLN: 4.0000 mg | Freq: Four times a day (QID) | INTRAMUSCULAR | Status: DC | PRN

## 2023-11-11 MED ORDER — FENTANYL CITRATE (PF) 100 MCG/2ML IJ SOLN
INTRAMUSCULAR | Status: DC | PRN
  Administered 2023-11-11: 50 ug via INTRAVENOUS

## 2023-11-11 MED ORDER — SODIUM CHLORIDE 0.9% FLUSH: 3.0000 mL | Freq: Two times a day (BID) | INTRAVENOUS | Status: DC

## 2023-11-11 MED ORDER — HEPARIN SODIUM (PORCINE) 1000 UNIT/ML IJ SOLN
INTRAMUSCULAR | Status: AC
  Filled 2023-11-11: qty 10

## 2023-11-11 MED ORDER — MIDAZOLAM HCL 2 MG/2ML IJ SOLN
INTRAMUSCULAR | Status: AC
Start: 2023-11-11 — End: ?
  Filled 2023-11-11: qty 2

## 2023-11-11 SURGICAL SUPPLY — 12 items
CATH 5FR JL3.5 JR4 ANG PIG MP (CATHETERS) IMPLANT
CATH BALLN WEDGE 5F 110CM (CATHETERS) IMPLANT
CLOSURE MYNX CONTROL 5F (Vascular Products) IMPLANT
GLIDESHEATH SLEND SS 6F .021 (SHEATH) IMPLANT
KIT MICROPUNCTURE NIT STIFF (SHEATH) IMPLANT
KIT SINGLE USE MANIFOLD (KITS) IMPLANT
PACK CARDIAC CATHETERIZATION (CUSTOM PROCEDURE TRAY) ×1 IMPLANT
SET ATX-X65L (MISCELLANEOUS) IMPLANT
SHEATH GLIDE SLENDER 4/5FR (SHEATH) IMPLANT
SHEATH PINNACLE 5F 10CM (SHEATH) IMPLANT
SHEATH PROBE COVER 6X72 (BAG) IMPLANT
WIRE EMERALD 3MM-J .035X150CM (WIRE) IMPLANT

## 2023-11-11 NOTE — Interval H&P Note (Signed)
History and Physical Interval Note:  11/11/2023 7:20 AM  Patricia Alvarado  has presented today for surgery, with the diagnosis of pre op.  The various methods of treatment have been discussed with the patient and family. After consideration of risks, benefits and other options for treatment, the patient has consented to  Procedure(s): RIGHT/LEFT HEART CATH AND CORONARY ANGIOGRAPHY (N/A) as a surgical intervention.  The patient's history has been reviewed, patient examined, no change in status, stable for surgery.  I have reviewed the patient's chart and labs.  Questions were answered to the patient's satisfaction.    Cath Lab Visit (complete for each Cath Lab visit)  Clinical Evaluation Leading to the Procedure:   ACS: No.  Non-ACS:    Anginal Classification: No Symptoms  Anti-ischemic medical therapy: No Therapy  Non-Invasive Test Results: No non-invasive testing performed  Prior CABG: No previous CABG        Verne Carrow

## 2023-11-11 NOTE — Progress Notes (Signed)
Patient and son was given discharge instructions. Both verbalized understanding. 

## 2023-11-11 NOTE — Discharge Instructions (Addendum)
Resume Eliquis tomorrow if no bleeding from cath sites

## 2023-11-12 ENCOUNTER — Encounter (HOSPITAL_COMMUNITY): Payer: Self-pay | Admitting: Cardiovascular Disease

## 2023-11-12 MED FILL — Verapamil HCl IV Soln 2.5 MG/ML: INTRAVENOUS | Qty: 2 | Status: AC

## 2023-11-17 DIAGNOSIS — L300 Nummular dermatitis: Secondary | ICD-10-CM | POA: Diagnosis not present

## 2023-11-17 DIAGNOSIS — L910 Hypertrophic scar: Secondary | ICD-10-CM | POA: Diagnosis not present

## 2023-11-24 NOTE — Progress Notes (Unsigned)
[address]       [place] [address]             [phone]           Jaeda Bruso [facility]well Medical Record #[id number] Date of Birth: [birth year]  [name], MD [name], NP  Chief Complaint: DOE and MV regurgitation    History of Present Illness:     Pt is a 78 yo female who has had a complicated cardiac history over the past several years. Pt was found to have afib in 2022 and underwent ablation procedure with success, but has had a life long issue with PVCs but this worsened to the point of requiring attempted Ventricular ablation that was not felt possible and ended up with AICD. She is still on eliquis and has more irregular beats at night but overall in NSR. She was noted to have MR and has been mild for many years. However she has been now found to have severe MR from multiple jets from a myxomatous MV with primarily P1,2 and 3 prolapse but also anterior as well. She has annular disjunction. She was felt best served with MV surgery and Cath was performed without CAD. Pt has no PHTN. Of note pt had bilateral mastectomy preventively in 1992 but developed nipple cancer and had resection and RT therapy in 2022 with now RT injury to her back skin and Right lung. She feels this may also be contributing to her SOB.      Past Medical History:  Diagnosis Date   A-fib [facility])    Arthritis    thumb   Basal cell carcinoma 2001   "forehead, between eyebrows" right arm (2022)   Breast cancer (HCC)    Carcinoma of thyroid gland (HCC) 2001   Chronic lower back pain    "worse is across my hips" (05/26/2016)   Depression    Dyslipidemia    Dyspnea    Family history of breast cancer    Family history of kidney cancer    Family history of leukemia    Family history of nonmelanoma skin cancer    Fibrocystic breast    Fibromyalgia    GERD (gastroesophageal reflux disease)    Heart murmur    History of blood transfusion 1992   "after subcutaneous  mastectomies"   Hypertension    not on any medications   Hypothyroidism    Malignant melanoma of left ankle (HCC) 2001   Migraine    "visual; 2-3 times/year" (05/26/2016)   Mitral valve prolapse    PONV (postoperative nausea and vomiting)    Ventricular tachycardia (HCC)    Hx of, controlled on sotalol therapy    Past Surgical History:  Procedure Laterality Date   ABDOMINAL HYSTERECTOMY  1998   ANTERIOR CERVICAL DECOMP/DISCECTOMY FUSION  2001   ATRIAL FIBRILLATION ABLATION N/A 09/12/2021   Procedure: ATRIAL FIBRILLATION ABLATION;  Surgeon: Regan Lemming, MD;  Location: [facility] INVASIVE CV [facility];  Service: Cardiovascular;  Laterality: N/A;   BACK SURGERY     BASAL CELL CARCINOMA EXCISION  2001   "cut it out & did a flap, on forehead between my eyebrows"   BREAST IMPLANT EXCHANGE Bilateral 2001   [id number]   BREAST IMPLANT EXCHANGE Left 05/05/2021   Procedure: BREAST IMPLANT EXCHANGE;  Surgeon: Peggye Form, DO;  Location: [facility] OR;  Service: Plastics;  Laterality: Left;   BREAST IMPLANT REMOVAL Right 02/05/2020   Procedure: REMOVAL RIGHT  BREAST IMPLANT AND CAPSULECTOMY;  Surgeon: Griselda Miner, MD;  Location: [facility];  Service: General;  Laterality: Right;   BREAST LUMPECTOMY Right 02/05/2020   Procedure: RIGHT BREAST CENTRAL LUMPECTOMY;  Surgeon: Griselda Miner, MD;  Location: [facility];  Service: General;  Laterality: Right;   BREAST RECONSTRUCTION WITH PLACEMENT OF TISSUE EXPANDER AND FLEX HD (ACELLULAR HYDRATED DERMIS) Right 01/20/2021   Procedure: BREAST RECONSTRUCTION WITH PLACEMENT OF TISSUE EXPANDER AND FLEX HD (ACELLULAR HYDRATED DERMIS);  Surgeon: Peggye Form, DO;  Location: [facility] OR;  Service: Plastics;  Laterality: Right;  2 hours   CARPAL TUNNEL RELEASE Right 2006   COLONOSCOPY     DILATION AND CURETTAGE OF UTERUS  1970s X 2-3   ELECTROPHYSIOLOGIC STUDY  1994 X 2;2001   "to see it it was sustained VT; cause thyroid levels were  causing arrhythmias"   EYE SURGERY  12/2020   cataracts   ICD IMPLANT N/A 12/28/2022   Procedure: ICD IMPLANT;  Surgeon: [name], MD;  Location: [facility] INVASIVE CV LAB;  Service: Cardiovascular;  Laterality: N/A;   LAPAROSCOPIC CHOLECYSTECTOMY     MASTECTOMY Bilateral 1992   "subcutaneous"   MELANOMA EXCISION Left 2001   "ankle, stage I"   PLACEMENT OF BREAST IMPLANTS Bilateral 1992   [id number]   PVC ABLATION N/A 12/23/2022   Procedure: PVC ABLATION;  Surgeon: Regan Lemming, MD;  Location: [facility];  Service: Cardiovascular;  Laterality: N/A;   REMOVAL OF TISSUE EXPANDER AND PLACEMENT OF IMPLANT Right 05/05/2021   Procedure: REMOVAL OF TISSUE EXPANDER AND PLACEMENT OF IMPLANT;  Surgeon: Peggye Form, DO;  Location: [facility];  Service: Plastics;  Laterality: Right;   RIGHT/LEFT HEART CATH AND CORONARY ANGIOGRAPHY N/A 11/11/2023   Procedure: RIGHT/LEFT HEART CATH AND CORONARY ANGIOGRAPHY;  Surgeon: Kathleene Hazel, MD;  Location: [facility];  Service: Cardiovascular;  Laterality: N/A;   TOTAL THYROIDECTOMY  12/1999   "cancer"   TRANSESOPHAGEAL ECHOCARDIOGRAM (CATH LAB) N/A 10/06/2023   Procedure: TRANSESOPHAGEAL ECHOCARDIOGRAM;  Surgeon: Tessa Lerner, DO;  Location: [facility];  Service: Cardiovascular;  Laterality: N/A;   VENTRICULAR ABLATION SURGERY  2011   ventricular tchycardia    Social History   Tobacco Use  Smoking Status Never   Passive exposure: Never  Smokeless Tobacco Never    Social History   Substance and Sexual Activity  Alcohol Use No    Social History   Socioeconomic History   Marital status: Married    Spouse name: Education officer, community (Spouse)   Number of children: Not on file   Years of education: Not on file   Highest education level: Not on file  Occupational History   Occupation: Adult nurse GI    Employer: [employer]  Tobacco Use   Smoking status: Never    Passive exposure: Never   Smokeless tobacco: Never   Vaping Use   Vaping status: Never Used  Substance and Sexual Activity   Alcohol use: No   Drug use: No   Sexual activity: Yes  Other Topics Concern   Not on file  Social History Narrative   Married   Social Drivers of Health   Financial Resource Strain: Not on file  Food Insecurity: No Food Insecurity (12/26/2022)   Hunger Vital Sign    Worried About Running Out of Food in the Last Year: Never true    Ran Out of Food in the Last Year: Never true  Transportation Needs: No  Transportation Needs (12/26/2022)   PRAPARE - Administrator, Civil Service (Medical): No    Lack of Transportation (Non-Medical): No  Physical Activity: Not on file  Stress: Not on file  Social Connections: Not on file  Intimate Partner Violence: Not At Risk (12/26/2022)   Humiliation, Afraid, Rape, and Kick questionnaire    Fear of Current or Ex-Partner: No    Emotionally Abused: No    Physically Abused: No    Sexually Abused: No    Allergies  Allergen Reactions   Quinidine Palpitations    REACTION: increased heart rate   Statins     Muscle pain   Tape     If left on long enough,will tear skin    Amoxicillin Itching and Rash   Atenolol Hives   Dofetilide Other (See Comments)    Elevated QTCs   Mexiletine Hcl Palpitations    Dizzy and extreme tiredness   Nadolol Hives   Vicodin  [Hydrocodone-Acetaminophen] Swelling    Facial redness and swelling    Current Outpatient Medications  Medication Sig Dispense Refill   acetaminophen (TYLENOL) 500 MG tablet Take 1,000 mg by mouth every 6 (six) hours as needed for mild pain or moderate pain.     ALPRAZolam (XANAX) 0.5 MG tablet Take 1 tablet (0.5 mg total) by mouth at bedtime as needed for sleep (Patient taking differently: Take 0.25 mg by mouth at bedtime.) 30 tablet 1   apixaban (ELIQUIS) 5 MG TABS tablet Take 1 tablet (5 mg total) by mouth 2 (two) times daily. 180 tablet 1   Ascorbic Acid (VITAMIN C) 1000 MG tablet Take 1,000 mg by mouth  daily.     B Complex-C (SUPER B COMPLEX PO) Take 1 capsule by mouth at bedtime.     beta carotene 16109 UNIT capsule Take 10,000 Units by mouth at bedtime.     Biotin 5 MG CAPS Take 5 mg by mouth at bedtime.      Calcium 600-200 MG-UNIT tablet Take 1 tablet by mouth daily.     cetirizine (ZYRTEC) 10 MG tablet Take 10 mg by mouth at bedtime.     Cholecalciferol (VITAMIN D) 50 MCG (2000 UT) CAPS Take 2,000 Units by mouth daily.     Coenzyme Q10 (COQ-10) 100 MG CAPS Take 100 mg by mouth daily.     Digestive Enzyme CAPS Take 1 capsule by mouth daily as needed (heartburn).     escitalopram (LEXAPRO) 20 MG tablet Take 1 tablet (20 mg total) by mouth daily. 90 tablet 4   Evolocumab (REPATHA SURECLICK) 140 MG/ML SOAJ Inject 140 mg into the skin every 14 (fourteen) days. (Patient not taking: Reported on 11/08/2023) 6 mL 3   ezetimibe (ZETIA) 10 MG tablet Take 1 tablet (10 mg total) by mouth daily. 90 tablet 4   famotidine (PEPCID) 20 MG tablet Take 20 mg by mouth as needed for heartburn or indigestion.     fluticasone (FLONASE) 50 MCG/ACT nasal spray Place 1 spray into both nostrils daily as needed for allergies.     furosemide (LASIX) 20 MG tablet Take 20 mg by mouth daily as needed for fluid (1-2lb weight gain overnight).     hydrocortisone cream 1 % Apply 1 Application topically daily as needed for itching.     Magnesium ([employer]S TRIPLE MAGNESIUM COMPLEX) 400 MG CAPS Take 400 mg by mouth daily.     MAGNESIUM MALATE PO Take 1,000 mg by mouth daily.     magnesium oxide (MAG-OX) 400  MG tablet Take 1 tablet (400 mg total) by mouth daily. 30 tablet 11   MANGANESE PO Take 40 mg by mouth at bedtime.     Melatonin 10 MG TABS Take 10 mg by mouth at bedtime.      Menatetrenone (VITAMIN K2) 100 MCG TABS Take 100 mcg by mouth daily.     methocarbamol (ROBAXIN) 500 MG tablet Take 1 tablet (500 mg total) by mouth every 8 (eight) hours as needed for neck pain 30 tablet 1   omeprazole (PRILOSEC) 40 MG capsule Take 1  capsule (40 mg total) by mouth every morning 30 minutes before breakfast 30 capsule 3   Polyvinyl Alcohol (LIQUID TEARS OP) Place 1 drop into both eyes daily as needed (dry eyes).     potassium chloride SA (KLOR-CON M) 20 MEQ tablet Take 1 tablet (20 mEq total) by mouth 2 (two) times daily. (Patient taking differently: Take 20 mEq by mouth daily as needed (Low potassium).) 60 tablet 11   Simethicone 125 MG CAPS Take 250 mg by mouth daily as needed (bloating).     sotalol (BETAPACE) 160 MG tablet Take 1 tablet (160 mg total) by mouth 2 (two) times daily. 180 tablet 2   thyroid (ARMOUR) 120 MG tablet Take 120 mg by mouth daily before breakfast.     triamcinolone (KENALOG) 0.025 % cream Apply 1 Application topically 2 (two) times daily as needed (rash).     zinc gluconate 50 MG tablet Take 50 mg by mouth daily.     No current facility-administered medications for this visit.     Family History  Problem Relation Age of Onset   Other Brother        AGENT ORANGE and AntiLupus   Heart disease Brother        Stents and bypass x2   Arrhythmia Brother        AFIB   Heart attack Brother    Hypertension Brother    Squamous cell carcinoma Brother 20       Skin   Hyperlipidemia Mother 18   Osteoporosis Mother    Basal cell carcinoma Mother 18   Heart disease Father 70   Other Father        Cardiac arrest   Breast cancer Maternal Grandmother 35       metastatic   Breast cancer Other        dx. unknown age; maternal great-aunt   Lung cancer Maternal Aunt 94       hx. of smoking   Other Paternal Aunt        bilateral mastectomies due to multiple breast lumps   Leukemia Maternal Aunt    Cancer Maternal Aunt        unknown types   Thyroid cancer Neg Hx        Physical Exam: Lungs; clear Card: very irregular with soft systolic murmur Ext: mild edema Neuro: intact Teeth in good repair     Diagnostic Studies & Laboratory data: I have personally reviewed the following studies and  agree with the findings   TTE(08/2023) IMPRESSIONS     1. Left ventricular ejection fraction, by estimation, is 55 to 60%. The  left ventricle has normal function. The left ventricle has no regional  wall motion abnormalities. There is mild left ventricular hypertrophy.  Left ventricular diastolic parameters  were normal. Elevated left ventricular end-diastolic pressure. The average  left ventricular global longitudinal strain is -15.0 %. The global  longitudinal strain is abnormal.  2. Right ventricular systolic function is normal. The right ventricular  size is normal. There is mildly elevated pulmonary artery systolic  pressure.   3. Left atrial size was severely dilated.   4. The mitral valve is myxomatous. Moderate to severe mitral valve  regurgitation. No evidence of mitral stenosis. There is prolapse of both  leaflets of the mitral valve.   5. The aortic valve is tricuspid. There is mild thickening of the aortic  valve. Aortic valve regurgitation is moderate. No aortic stenosis is  present.   6. Aortic Normal DTA.   7. The inferior vena cava is normal in size with greater than 50%  respiratory variability, suggesting right atrial pressure of 3 mmHg.   FINDINGS   Left Ventricle: Left ventricular ejection fraction, by estimation, is 55  to 60%. The left ventricle has normal function. The left ventricle has no  regional wall motion abnormalities. The average left ventricular global  longitudinal strain is -15.0 %.  The global longitudinal strain is abnormal. The left ventricular internal  cavity size was normal in size. There is mild left ventricular  hypertrophy. Left ventricular diastolic parameters were normal. Elevated  left ventricular end-diastolic pressure.   Right Ventricle: The right ventricular size is normal. No increase in  right ventricular wall thickness. Right ventricular systolic function is  normal. There is mildly elevated pulmonary artery systolic  pressure. The  tricuspid regurgitant velocity is 2.92   m/s, and with an assumed right atrial pressure of 3 mmHg, the estimated  right ventricular systolic pressure is 37.1 mmHg.   Left Atrium: Left atrial size was severely dilated.   Right Atrium: Right atrial size was normal in size.   Pericardium: There is no evidence of pericardial effusion.   Mitral Valve: The mitral valve is myxomatous. There is prolapse of both  leaflets of the mitral valve. There is moderate thickening of the mitral  valve leaflet(s). Mild mitral annular calcification. Moderate to severe  mitral valve regurgitation. No  evidence of mitral valve stenosis.   Tricuspid Valve: The tricuspid valve is normal in structure. Tricuspid  valve regurgitation is mild . No evidence of tricuspid stenosis.   Aortic Valve: The aortic valve is tricuspid. There is mild thickening of  the aortic valve. Aortic valve regurgitation is moderate. Aortic  regurgitation PHT measures 452 msec. No aortic stenosis is present.   Pulmonic Valve: The pulmonic valve was normal in structure. Pulmonic valve  regurgitation is mild. No evidence of pulmonic stenosis.   Aorta: The aortic arch was not well visualized, the aortic root and  ascending aorta are structurally normal, with no evidence of dilitation  and Normal DTA.   Venous: A systolic blunting flow pattern is recorded from the right upper  pulmonary vein. The inferior vena cava is normal in size with greater than  50% respiratory variability, suggesting right atrial pressure of 3 mmHg.   IAS/Shunts: No atrial level shunt detected by color flow Doppler.   Additional Comments: A device lead is visualized in the right ventricle.     LEFT VENTRICLE  PLAX 2D  LVIDd:         5.30 cm   Diastology  LVIDs:         4.00 cm   LV e' medial:    8.59 cm/s  LV PW:         1.30 cm   LV E/e' medial:  9.1  LV IVS:        1.20 cm  LV e' lateral:   11.90 cm/s  LVOT diam:     2.00 cm   LV E/e'  lateral: 6.6  LV SV:         55  LV SV Index:   32        2D Longitudinal Strain  LVOT Area:     3.14 cm  2D Strain GLS Avg:     -15.0 %     RIGHT VENTRICLE             IVC  RV Basal diam:  3.20 cm     IVC diam: 1.40 cm  RV S prime:     15.70 cm/s  TAPSE (M-mode): 2.1 cm   LEFT ATRIUM             Index        RIGHT ATRIUM           Index  LA diam:        4.50 cm 2.59 cm/m   RA Area:     15.60 cm  LA Vol (A2C):   92.6 ml 53.36 ml/m  RA Volume:   39.00 ml  22.47 ml/m  LA Vol (A4C):   81.0 ml 46.67 ml/m  LA Biplane Vol: 92.6 ml 53.36 ml/m   AORTIC VALVE  LVOT Vmax:   82.73 cm/s  LVOT Vmean:  55.333 cm/s  LVOT VTI:    0.176 m  AI PHT:      452 msec    AORTA  Ao Root diam: 2.90 cm  Ao Asc diam:  3.10 cm  Ao Desc diam: 2.30 cm   MR Peak grad:    144.5 mmHg   TRICUSPID VALVE  MR Mean grad:    104.0 mmHg   TR Peak grad:   34.1 mmHg  MR Vmax:         601.00 cm/s  TR Vmax:        292.00 cm/s  MR Vmean:        490.0 cm/s  MR PISA:         5.67 cm     SHUNTS  MR PISA Eff ROA: 36 mm       Systemic VTI:  0.18 m  MR PISA Radius:  0.95 cm      Systemic Diam: 2.00 cm  MV E velocity: 78.10 cm/s  MV A velocity: 58.20 cm/s  MV E/A ratio:  1.34   TEE (09/2023) IMPRESSIONS     1. Left ventricular ejection fraction, by estimation, is 60 to 65%. The  left ventricle has normal function. The left ventricle has no regional  wall motion abnormalities. Left ventricular diastolic parameters are  consistent with Grade III diastolic  dysfunction (restrictive). Elevated left atrial pressure. The E/e' is 16.   2. Right ventricular systolic function is normal. The right ventricular  size is normal. There is normal pulmonary artery systolic pressure.   3. Left atrial size was severely dilated. No left atrial/left atrial  appendage thrombus was detected. The LAA emptying velocity was 44 cm/s.   4. Native mitral valve, predominantly degenerative, bileaflet prolapse  but predominantly posterior  leaflet (involving the P1, P2, P3 scallops and  in some views A2 scallop as well). Multiple jets centrally directed. No  valvular stenosis.   5. Tricuspid valve regurgitation is mild to moderate.   6. The aortic valve is tricuspid. Aortic valve regurgitation is mild to  moderate. No aortic stenosis is present.   7. The inferior vena  cava is dilated in size with >50% respiratory  variability, suggesting right atrial pressure of 8 mmHg.   8. Agitated saline contrast bubble study was negative, with no evidence  of any interatrial shunt.   9. Rhythm strip during this exam demonstrates normal sinus rhythm.   FINDINGS   Left Ventricle: Left ventricular ejection fraction, by estimation, is 60  to 65%. The left ventricle has normal function. The left ventricle has no  regional wall motion abnormalities. The left ventricular internal cavity  size was normal in size. There is   no left ventricular hypertrophy. Left ventricular diastolic parameters  are consistent with Grade III diastolic dysfunction (restrictive). The  E/e' is 16.   Right Ventricle: The right ventricular size is normal. No increase in  right ventricular wall thickness. Right ventricular systolic function is  normal. There is normal pulmonary artery systolic pressure. The tricuspid  regurgitant velocity is 2.21 m/s, and   with an assumed right atrial pressure of 8 mmHg, the estimated right  ventricular systolic pressure is 27.5 mmHg.   Left Atrium: Left atrial size was severely dilated. No left atrial/left  atrial appendage thrombus was detected. The LAA emptying velocity was 44  cm/s.   Right Atrium: Right atrial size was normal in size.   Pericardium: There is no evidence of pericardial effusion.   Mitral Valve: Native mitral valve, predominantly degenerative, bileaflet  prolapse but predominantly posterior leaflet (involving the P1, P2, P3  scallops and in some views A2 scallop as well). Multiple jets centrally   directed. No valvular stenosis. Mild  mitral annular calcification.   Tricuspid Valve: The tricuspid valve is normal in structure. Tricuspid  valve regurgitation is mild to moderate. No evidence of tricuspid  stenosis.   Aortic Valve: The aortic valve is tricuspid. Aortic valve regurgitation is  mild to moderate. No aortic stenosis is present.   Pulmonic Valve: The pulmonic valve was normal in structure. Pulmonic valve  regurgitation is mild. No evidence of pulmonic stenosis.   Aorta: The aortic root is normal in size and structure.   Venous: A systolic blunting flow pattern is recorded from the left upper  pulmonary vein and the left lower pulmonary vein. The inferior vena cava  is dilated in size with greater than 50% respiratory variability,  suggesting right atrial pressure of 8  mmHg.   IAS/Shunts: No atrial level shunt detected by color flow Doppler. Agitated  saline contrast was given intravenously to evaluate for intracardiac  shunting. Agitated saline contrast bubble study was negative, with no  evidence of any interatrial shunt.   EKG: Rhythm strip during this exam demonstrates normal sinus rhythm.   Additional Comments: A device lead is visualized.   LEFT VENTRICLE  PLAX 2D  LVIDd:         5.30 cm   Diastology  LVIDs:         3.20 cm   LV e' medial:    5.55 cm/s  LV PW:         1.00 cm   LV E/e' medial:  16.9  LV IVS:        0.90 cm   LV e' lateral:   5.98 cm/s  LVOT diam:     1.90 cm   LV E/e' lateral: 15.7  LV SV:         62  LV SV Index:   35  LVOT Area:     2.84 cm     AORTIC VALVE  LVOT Vmax:  110.00 cm/s  LVOT Vmean:  74.700 cm/s  LVOT VTI:    0.217 m   MITRAL VALVE                  TRICUSPID VALVE  MV Area (PHT): 3.83 cm       TR Peak grad:   19.5 mmHg  MV Decel Time: 198 msec       TR Vmax:        221.00 cm/s  MR Peak grad:    120.6 mmHg  MR Mean grad:    65.0 mmHg    SHUNTS  MR Vmax:         549.00 cm/s  Systemic VTI:  0.22 m  MR Vmean:         369.7 cm/s   Systemic Diam: 1.90 cm  MR PISA:         1.90 cm  MR PISA Eff ROA: 13 mm  MR PISA Radius:  0.55 cm  MV E velocity: 93.80 cm/s  MV A velocity: 41.20 cm/s  MV E/A ratio:  2.28   CATH (10/2023) Conclusion      Prox RCA lesion is 10% stenosed.   Mild non-obstructive CAD Normal right and left heart pressures  Hemo Data  Flowsheet Row Most Recent Value  Fick Cardiac Output 4.1 L/min  Fick Cardiac Output Index 2.35 (L/min)/BSA  Aortic Mean Gradient 1.05 mmHg  Aortic Peak Gradient 0.3 mmHg  Aortic Valve Area >3.50  Aortic Value Area Index 2 cm2/BSA  RA A Wave 6 mmHg  RA V Wave 4 mmHg  RA Mean 1 mmHg  RV Systolic Pressure 33 mmHg  RV Diastolic Pressure -6 mmHg  RV EDP 9 mmHg  PA Systolic Pressure 33 mmHg  PA Diastolic Pressure 2 mmHg  PA Mean 18 mmHg  PW A Wave 12 mmHg  PW V Wave 19 mmHg  PW Mean 10 mmHg  LV Systolic Pressure 139 mmHg  LV Diastolic Pressure 1 mmHg  LV EDP 7 mmHg  Arterial Occlusion Pressure Extended Systolic Pressure 136 mmHg  Arterial Occlusion Pressure Extended Diastolic Pressure 50 mmHg  Arterial Occlusion Pressure Extended Mean Pressure 85 mmHg  Left Ventricular Apex Extended Systolic Pressure 133 mmHg  LVp Diastolic Pressure -1 mmHg  Left Ventricular Apex Extended EDP Pressure 8 mmHg  QP/QS 1  TPVR Index 7.68 HRUI    Recent Radiology Findings:       Recent Lab Findings: Lab Results  Component Value Date   WBC 6.3 11/08/2023   HGB 11.2 (L) 11/11/2023   HCT 33.0 (L) 11/11/2023   PLT 216 11/08/2023   GLUCOSE 103 (H) 11/08/2023   CHOL 155 08/21/2022   TRIG 94 08/21/2022   HDL 74 08/21/2022   LDLCALC 64 08/21/2022   ALT 16 08/06/2020   AST 18 08/06/2020   NA 142 11/11/2023   K 3.5 11/11/2023   CL 101 11/08/2023   CREATININE 0.63 11/08/2023   BUN 23 11/08/2023   CO2 25 11/08/2023   TSH 0.130 (L) 05/26/2016   INR 1.0 ratio 01/14/2010      Assessment / Plan:     78 yo female with NYHA class 2 symptoms of severe MR  with normal LV function and no CAD or PHTN with long history of arrhythmias sp atrial ablation and with AICD. Pt has a class I indication for MV surgery and hopefully with repair however with extensive prolapse of entire posterior leaflet and anterior myxomatous valve may be more difficult and tissue valve  replacement would be fall back. She understands all the risks and goals and recovery from surgery with slightly higher wound complications secondary to her RT therapy and wishes to proceed on 2/11. Will need to be off eliquis for 5 days and have AICD arranged to be turned off at time of surgery. We will obtain noncontrast CT of chest and carotid duplex prior to surgery   I have spent 60 min in review of the records, viewing studies and in face to face with patient and in coordination of future care    Eugenio Hoes 11/24/2023 1:06 PM

## 2023-11-25 ENCOUNTER — Encounter: Payer: Self-pay | Admitting: *Deleted

## 2023-11-25 ENCOUNTER — Other Ambulatory Visit: Payer: Self-pay | Admitting: Thoracic Surgery (Cardiothoracic Vascular Surgery)

## 2023-11-25 ENCOUNTER — Other Ambulatory Visit: Payer: Self-pay | Admitting: *Deleted

## 2023-11-25 ENCOUNTER — Encounter: Payer: Self-pay | Admitting: Thoracic Surgery (Cardiothoracic Vascular Surgery)

## 2023-11-25 ENCOUNTER — Institutional Professional Consult (permissible substitution): Payer: PPO | Admitting: Thoracic Surgery (Cardiothoracic Vascular Surgery)

## 2023-11-25 VITALS — BP 138/66 | HR 76 | Resp 20 | Ht 64.5 in | Wt 151.4 lb

## 2023-11-25 DIAGNOSIS — I059 Rheumatic mitral valve disease, unspecified: Secondary | ICD-10-CM

## 2023-11-25 NOTE — Patient Instructions (Signed)
MV surgery 2/11 Stop eliquis for 5 days prior CT scan and carotid duplex

## 2023-11-30 ENCOUNTER — Telehealth: Payer: Self-pay | Admitting: Internal Medicine

## 2023-11-30 NOTE — Telephone Encounter (Signed)
Received FMLA form from [place] for patient's son, Patricia Alvarado.  Patient will be coming in on Friday, 02/07 to pay and sign ROI.

## 2023-11-30 NOTE — Telephone Encounter (Signed)
 Noted

## 2023-12-02 ENCOUNTER — Encounter: Payer: Self-pay | Admitting: Internal Medicine

## 2023-12-02 NOTE — Progress Notes (Signed)
 PERIOPERATIVE PRESCRIPTION FOR IMPLANTED CARDIAC DEVICE PROGRAMMING  Patient Information: Name:  Patricia Alvarado  DOB:  Aug 17, 1946  MRN:  [id number]    Planned Procedure:  Mitral valve repair, possible replacement  Surgeon:  Dr. Deward Kallman  Date of Procedure:  12/07/23  Cautery will be used.  Position during surgery:  supine   Please send documentation back to:  [facility] (Fax # [phone])  Device Information:  Clinic EP Physician:  Danelle Birmingham, MD   Device Type:  Defibrillator Manufacturer and Phone #:  Medtronic: [phone] Pacemaker Dependent?:  No. Date of Last Device Check:  09/26/2023 Normal Device Function?:  Yes.    Electrophysiologist's Recommendations:  Have magnet available. Provide continuous ECG monitoring when magnet is used or reprogramming is to be performed.  Procedure will likely interfere with device function.  Device should be programmed:  Tachy therapies disabled  Per Device Clinic Standing Orders, Patricia ONEIDA Shutter, RN  2:54 PM 12/02/2023

## 2023-12-02 NOTE — Pre-Procedure Instructions (Signed)
 Surgical Instructions   Your procedure is scheduled on Tuesday, February 11th. Report to [facility] Main Entrance A at 05:30 A.M., then check in with the Admitting office. Any questions or running late day of surgery: call [phone]  Questions prior to your surgery date: call [phone], Monday-Friday, 8am-4pm. If you experience any cold or flu symptoms such as cough, fever, chills, shortness of breath, etc. between now and your scheduled surgery, please notify us  at the above number.     Remember:  Do not eat or drink after midnight the night before your surgery     Take these medicines the morning of surgery with A SIP OF WATER  escitalopram  (LEXAPRO )  ezetimibe  (ZETIA )  omeprazole  (PRILOSEC)  sotalol  (BETAPACE )  thyroid  (ARMOUR)    May take these medicines IF NEEDED: acetaminophen  (TYLENOL )  famotidine  (PEPCID )  fluticasone  (FLONASE )  methocarbamol  (ROBAXIN )  Polyvinyl Alcohol (LIQUID TEARS OP)    Stop Eliquis  5 days prior to surgery. Last dose 2/5.   One week prior to surgery, STOP taking any Aspirin  (unless otherwise instructed by your surgeon) Aleve, Naproxen, Ibuprofen, Motrin, Advil, Goody's, BC's, all herbal medications, fish oil, and non-prescription vitamins.                     Do NOT Smoke (Tobacco/Vaping) for 24 hours prior to your procedure.  If you use a CPAP at night, you may bring your mask/headgear for your overnight stay.   You will be asked to remove any contacts, glasses, piercing's, hearing aid's, dentures/partials prior to surgery. Please bring cases for these items if needed.    Patients discharged the day of surgery will not be allowed to drive home, and someone needs to stay with them for 24 hours.  SURGICAL WAITING ROOM VISITATION Patients may have no more than 2 support people in the waiting area - these visitors may rotate.   Pre-op  nurse will coordinate an appropriate time for 1 ADULT support person, who may not rotate, to  accompany patient in pre-op .  Children under the age of 81 must have an adult with them who is not the patient and must remain in the main waiting area with an adult.  If the patient needs to stay at the hospital during part of their recovery, the visitor guidelines for inpatient rooms apply.  Please refer to the [facility] website for the visitor guidelines for any additional information.   If you received a COVID test during your pre-op  visit  it is requested that you wear a mask when out in public, stay away from anyone that may not be feeling well and notify your surgeon if you develop symptoms. If you have been in contact with anyone that has tested positive in the last 10 days please notify you surgeon.      Pre-operative CHG Bathing Instructions   You can play a key role in reducing the risk of infection after surgery. Your skin needs to be as free of germs as possible. You can reduce the number of germs on your skin by washing with CHG (chlorhexidine  gluconate) soap before surgery. CHG is an antiseptic soap that kills germs and continues to kill germs even after washing.   DO NOT use if you have an allergy to chlorhexidine /CHG or antibacterial soaps. If your skin becomes reddened or irritated, stop using the CHG and notify one of our RNs at [phone].              TAKE A SHOWER THE  NIGHT BEFORE SURGERY AND THE DAY OF SURGERY    Please keep in mind the following:  DO NOT shave, including legs and underarms, 48 hours prior to surgery.   You may shave your face before/day of surgery.  Place clean sheets on your bed the night before surgery Use a clean washcloth (not used since being washed) for each shower. DO NOT sleep with pet's night before surgery.  CHG Shower Instructions:  Wash your face and private area with normal soap. If you choose to wash your hair, wash first with your normal shampoo.  After you use shampoo/soap, rinse your hair and body thoroughly to remove  shampoo/soap residue.  Turn the water OFF and apply half the bottle of CHG soap to a CLEAN washcloth.  Apply CHG soap ONLY FROM YOUR NECK DOWN TO YOUR TOES (washing for 3-5 minutes)  DO NOT use CHG soap on face, private areas, open wounds, or sores.  Pay special attention to the area where your surgery is being performed.  If you are having back surgery, having someone wash your back for you may be helpful. Wait 2 minutes after CHG soap is applied, then you may rinse off the CHG soap.  Pat dry with a clean towel  Put on clean pajamas    Additional instructions for the day of surgery: DO NOT APPLY any lotions, deodorants, cologne, or perfumes.   Do not wear jewelry or makeup Do not wear nail polish, gel polish, artificial nails, or any other type of covering on natural nails (fingers and toes) Do not bring valuables to the hospital. [facility] is not responsible for valuables/personal belongings. Put on clean/comfortable clothes.  Please brush your teeth.  Ask your nurse before applying any prescription medications to the skin.

## 2023-12-02 NOTE — Progress Notes (Signed)
 Message sent to [facility] requesting device orders for surgery. Awaiting orders

## 2023-12-03 ENCOUNTER — Encounter (HOSPITAL_COMMUNITY): Payer: Self-pay

## 2023-12-03 ENCOUNTER — Ambulatory Visit (HOSPITAL_COMMUNITY)
Admission: RE | Admit: 2023-12-03 | Discharge: 2023-12-03 | Disposition: A | Discharge status: Home / Self Care | Payer: PPO | Source: Ambulatory Visit | Attending: Thoracic Surgery (Cardiothoracic Vascular Surgery) | Admitting: Thoracic Surgery (Cardiothoracic Vascular Surgery)

## 2023-12-03 ENCOUNTER — Encounter (HOSPITAL_COMMUNITY)
Admission: RE | Admit: 2023-12-03 | Discharge: 2023-12-03 | Disposition: A | Discharge status: Home / Self Care | Payer: PPO | Source: Ambulatory Visit | Attending: Thoracic Surgery (Cardiothoracic Vascular Surgery) | Admitting: Thoracic Surgery (Cardiothoracic Vascular Surgery)

## 2023-12-03 ENCOUNTER — Telehealth: Payer: Self-pay | Admitting: Cardiology

## 2023-12-03 ENCOUNTER — Other Ambulatory Visit: Payer: Self-pay

## 2023-12-03 VITALS — BP 126/57 | HR 66 | Temp 98.2°F | Resp 18 | Ht 64.5 in | Wt 158.0 lb

## 2023-12-03 DIAGNOSIS — K219 Gastro-esophageal reflux disease without esophagitis: Secondary | ICD-10-CM | POA: Diagnosis not present

## 2023-12-03 DIAGNOSIS — Z79899 Other long term (current) drug therapy: Secondary | ICD-10-CM | POA: Insufficient documentation

## 2023-12-03 DIAGNOSIS — E890 Postprocedural hypothyroidism: Secondary | ICD-10-CM | POA: Insufficient documentation

## 2023-12-03 DIAGNOSIS — Z01810 Encounter for preprocedural cardiovascular examination: Secondary | ICD-10-CM | POA: Insufficient documentation

## 2023-12-03 DIAGNOSIS — E785 Hyperlipidemia, unspecified: Secondary | ICD-10-CM | POA: Diagnosis not present

## 2023-12-03 DIAGNOSIS — Z0279 Encounter for issue of other medical certificate: Secondary | ICD-10-CM

## 2023-12-03 DIAGNOSIS — Z853 Personal history of malignant neoplasm of breast: Secondary | ICD-10-CM | POA: Insufficient documentation

## 2023-12-03 DIAGNOSIS — I10 Essential (primary) hypertension: Secondary | ICD-10-CM | POA: Insufficient documentation

## 2023-12-03 DIAGNOSIS — M797 Fibromyalgia: Secondary | ICD-10-CM | POA: Insufficient documentation

## 2023-12-03 DIAGNOSIS — I059 Rheumatic mitral valve disease, unspecified: Secondary | ICD-10-CM

## 2023-12-03 DIAGNOSIS — Z01818 Encounter for other preprocedural examination: Secondary | ICD-10-CM

## 2023-12-03 DIAGNOSIS — I700 Atherosclerosis of aorta: Secondary | ICD-10-CM | POA: Diagnosis not present

## 2023-12-03 DIAGNOSIS — Z9889 Other specified postprocedural states: Secondary | ICD-10-CM | POA: Insufficient documentation

## 2023-12-03 DIAGNOSIS — R0609 Other forms of dyspnea: Secondary | ICD-10-CM | POA: Insufficient documentation

## 2023-12-03 DIAGNOSIS — Z950 Presence of cardiac pacemaker: Secondary | ICD-10-CM | POA: Insufficient documentation

## 2023-12-03 DIAGNOSIS — I480 Paroxysmal atrial fibrillation: Secondary | ICD-10-CM | POA: Diagnosis not present

## 2023-12-03 DIAGNOSIS — R918 Other nonspecific abnormal finding of lung field: Secondary | ICD-10-CM | POA: Diagnosis not present

## 2023-12-03 HISTORY — DX: Cardiac arrhythmia, unspecified: I49.9

## 2023-12-03 HISTORY — DX: Anxiety disorder, unspecified: F41.9

## 2023-12-03 HISTORY — DX: Presence of cardiac pacemaker: Z95.0

## 2023-12-03 HISTORY — DX: Presence of automatic (implantable) cardiac defibrillator: Z95.810

## 2023-12-03 HISTORY — DX: Personal history of other diseases of the digestive system: Z87.19

## 2023-12-03 LAB — COMPREHENSIVE METABOLIC PANEL
ALT: 19 U/L (ref 0–44)
AST: 27 U/L (ref 15–41)
Albumin: 3.7 g/dL (ref 3.5–5.0)
Alkaline Phosphatase: 71 U/L (ref 38–126)
Anion gap: 9 (ref 5–15)
BUN: 20 mg/dL (ref 8–23)
CO2: 27 mmol/L (ref 22–32)
Calcium: 9.8 mg/dL (ref 8.9–10.3)
Chloride: 103 mmol/L (ref 98–111)
Creatinine, Ser: 0.69 mg/dL (ref 0.44–1.00)
GFR, Estimated: >60 mL/min (ref >60)
Glucose, Bld: 91 mg/dL (ref 70–99)
Potassium: 4.1 mmol/L (ref 3.5–5.1)
Sodium: 139 mmol/L (ref 135–145)
Total Bilirubin: 0.6 mg/dL (ref 0.0–1.2)
Total Protein: 7 g/dL (ref 6.5–8.1)

## 2023-12-03 LAB — CBC
HCT: 42 % (ref 36.0–46.0)
Hemoglobin: 13.9 g/dL (ref 12.0–15.0)
MCH: 31.5 pg (ref 26.0–34.0)
MCHC: 33.1 g/dL (ref 30.0–36.0)
MCV: 95.2 fL (ref 80.0–100.0)
Platelets: 211 10*3/uL (ref 150–400)
RBC: 4.41 MIL/uL (ref 3.87–5.11)
RDW: 13.3 % (ref 11.5–15.5)
WBC: 7 10*3/uL (ref 4.0–10.5)
nRBC: 0 % (ref 0.0–0.2)

## 2023-12-03 LAB — URINALYSIS, ROUTINE W REFLEX MICROSCOPIC
Bilirubin Urine: NEGATIVE
Glucose, UA: NEGATIVE mg/dL
Hgb urine dipstick: NEGATIVE
Ketones, ur: NEGATIVE mg/dL
Leukocytes,Ua: NEGATIVE
Nitrite: NEGATIVE
Protein, ur: NEGATIVE mg/dL
Specific Gravity, Urine: 1.017 (ref 1.005–1.030)
pH: 5 (ref 5.0–8.0)

## 2023-12-03 LAB — PROTIME-INR
INR: 1 (ref 0.8–1.2)
Prothrombin Time: 13.5 s (ref 11.4–15.2)

## 2023-12-03 LAB — HEMOGLOBIN A1C
Hgb A1c MFr Bld: 5.4 % (ref 4.8–5.6)
Mean Plasma Glucose: 108.28 mg/dL

## 2023-12-03 LAB — APTT: aPTT: 32 s (ref 24–36)

## 2023-12-03 LAB — SURGICAL PCR SCREEN
MRSA, PCR: NEGATIVE
Staphylococcus aureus: NEGATIVE

## 2023-12-03 NOTE — Progress Notes (Signed)
 Carotid arterial duplex completed. Please see CV Procedures for preliminary results.  Estanislao Heimlich, RVT 12/03/23 10:41 AM

## 2023-12-03 NOTE — Telephone Encounter (Signed)
 Form placed in Dr. Meridith Stanford box.

## 2023-12-03 NOTE — Progress Notes (Signed)
 PCP - Greig Feeling, NP Cardiologist - Dr. Newman Lawrence - Last office visit 11/08/2023 Electrophysiologist - Dr. Danelle Birmingham - Last office visit 04/06/2023  PPM/ICD - Medtronic PPM/ICD Device Orders - Device orders requested and available in electronic chart Rep Notified - Medtronic rep emailed 12/02/23  Chest x-ray - 12/03/2023 EKG - 11/08/2023 Stress Test - 08/1999 ECHO - 10/06/2023 Cardiac Cath - 11/11/2023  Sleep Study - Denies CPAP - n/a  No DM  Last dose of GLP1 agonist- n/a GLP1 instructions: n/a  Blood Thinner Instructions: Pt instructed to stop Eliquis  5 days prior to surgery. Last dose was February 5th. Aspirin  Instructions: n/a  NPO after midnight   COVID TEST- n/a   Anesthesia review: Yes. Hx of VT with placement of PPM/ICD, HTN, A. Fib on anticoagulant.  Patient denies shortness of breath, fever, cough and chest pain at PAT appointment. Pt denies any respiratory illness/infection in the last two months.   All instructions explained to the patient, with a verbal understanding of the material. Patient agrees to go over the instructions while at home for a better understanding. Patient also instructed to self quarantine after being tested for COVID-19. The opportunity to ask questions was provided.

## 2023-12-03 NOTE — Telephone Encounter (Signed)
 Patient stopped by to sign FMLA documents and pay $29 (check). Patient stated it was to have her son care for her due to heart surgery.

## 2023-12-06 ENCOUNTER — Other Ambulatory Visit (HOSPITAL_COMMUNITY): Payer: Self-pay

## 2023-12-06 MED ORDER — OMEPRAZOLE 40 MG PO CPDR
40.0000 mg | DELAYED_RELEASE_CAPSULE | Freq: Every morning | ORAL | 3 refills | Status: DC
  Filled 2023-12-06: qty 30, 30d supply, fill #0
  Filled 2024-01-11: qty 30, 30d supply, fill #1
  Filled 2024-02-07: qty 30, 30d supply, fill #2
  Filled 2024-03-06: qty 30, 30d supply, fill #3

## 2023-12-06 MED ORDER — TRANEXAMIC ACID (OHS) BOLUS VIA INFUSION
15.0000 mg/kg | INTRAVENOUS | Status: AC
  Administered 2023-12-07: 1075.5 mg via INTRAVENOUS
  Filled 2023-12-06: qty 1076

## 2023-12-06 MED ORDER — TRANEXAMIC ACID (OHS) PUMP PRIME SOLUTION
2.0000 mg/kg | INTRAVENOUS | Status: DC
  Filled 2023-12-06: qty 1.43

## 2023-12-06 MED ORDER — PLASMA-LYTE A IV SOLN
INTRAVENOUS | Status: DC
  Filled 2023-12-06: qty 2.5

## 2023-12-06 MED ORDER — MILRINONE LACTATE IN DEXTROSE 20-5 MG/100ML-% IV SOLN
0.3000 ug/kg/min | INTRAVENOUS | Status: DC
  Filled 2023-12-06: qty 100

## 2023-12-06 MED ORDER — POTASSIUM CHLORIDE 2 MEQ/ML IV SOLN
80.0000 meq | INTRAVENOUS | Status: DC
  Filled 2023-12-06: qty 40

## 2023-12-06 MED ORDER — DEXMEDETOMIDINE HCL IN NACL 400 MCG/100ML IV SOLN
0.1000 ug/kg/h | INTRAVENOUS | Status: AC
  Administered 2023-12-07: .4 ug/kg/h via INTRAVENOUS
  Filled 2023-12-06: qty 100

## 2023-12-06 MED ORDER — INSULIN REGULAR(HUMAN) IN NACL 100-0.9 UT/100ML-% IV SOLN
INTRAVENOUS | Status: AC
  Administered 2023-12-07: .7 [IU]/h via INTRAVENOUS
  Filled 2023-12-06: qty 100

## 2023-12-06 MED ORDER — PHENYLEPHRINE HCL-NACL 20-0.9 MG/250ML-% IV SOLN
30.0000 ug/min | INTRAVENOUS | Status: AC
  Administered 2023-12-07: 10 ug/min via INTRAVENOUS
  Administered 2023-12-07: 25 ug/min via INTRAVENOUS
  Filled 2023-12-06: qty 250

## 2023-12-06 MED ORDER — VANCOMYCIN HCL 1250 MG/250ML IV SOLN
1250.0000 mg | INTRAVENOUS | Status: AC
  Administered 2023-12-07: 1250 mg via INTRAVENOUS
  Filled 2023-12-06: qty 250

## 2023-12-06 MED ORDER — CEFAZOLIN SODIUM-DEXTROSE 2-4 GM/100ML-% IV SOLN
2.0000 g | INTRAVENOUS | Status: AC
  Administered 2023-12-07 (×2): 2 g via INTRAVENOUS
  Filled 2023-12-06: qty 100

## 2023-12-06 MED ORDER — NITROGLYCERIN IN D5W 200-5 MCG/ML-% IV SOLN
2.0000 ug/min | INTRAVENOUS | Status: DC
  Filled 2023-12-06: qty 250

## 2023-12-06 MED ORDER — VANCOMYCIN HCL 1000 MG IV SOLR
INTRAVENOUS | Status: DC
  Filled 2023-12-06: qty 20

## 2023-12-06 MED ORDER — CEFAZOLIN SODIUM-DEXTROSE 2-4 GM/100ML-% IV SOLN
2.0000 g | INTRAVENOUS | Status: DC
  Filled 2023-12-06: qty 100

## 2023-12-06 MED ORDER — NOREPINEPHRINE 4 MG/250ML-% IV SOLN
0.0000 ug/min | INTRAVENOUS | Status: DC
  Filled 2023-12-06: qty 250

## 2023-12-06 MED ORDER — MANNITOL 20 % IV SOLN
INTRAVENOUS | Status: DC
  Filled 2023-12-06: qty 13

## 2023-12-06 MED ORDER — TRANEXAMIC ACID 1000 MG/10ML IV SOLN
1.5000 mg/kg/h | INTRAVENOUS | Status: AC
  Administered 2023-12-07: 1.5 mg/kg/h via INTRAVENOUS
  Filled 2023-12-06: qty 25

## 2023-12-06 MED ORDER — HEPARIN 30,000 UNITS/1000 ML (OHS) CELLSAVER SOLUTION
Status: DC
  Filled 2023-12-06: qty 1000

## 2023-12-06 MED ORDER — EPINEPHRINE HCL 5 MG/250ML IV SOLN IN NS
0.0000 ug/min | INTRAVENOUS | Status: DC
  Filled 2023-12-06: qty 250

## 2023-12-06 NOTE — Progress Notes (Signed)
 Anesthesia Chart Review:  Case: [id number] Date/Time: 12/07/23 0715   Procedures:      MITRAL VALVE REPAIR, possible replacement (MVR) (Chest)     TRANSESOPHAGEAL ECHOCARDIOGRAM (TEE)   Anesthesia type: General   Pre-op  diagnosis: SEVERE MR   [facility] / [facility]   Surgeons: Maryjane Mt, MD       DISCUSSION: Patient is a 78 year old female scheduled for the above procedure.  History includes  never smoker, post-operative N/V, murmur/MVP, VT (VT ablation 01/21/10; PVC ablation 12/23/22, s/p Medtronic ICD 12/28/22), PAF (afib ablation 09/12/21), severe mitral regurgitation, HTN, dyslipidemia, dyspnea, GERD, fibromyalgia, thyroid  cancer (s/p thyroidectomy 01/13/00, post-surgical hypothyroidism), skin cancer (BCC & melanoma 2001), breast cancer (s/ bilateral nipple sparing mastectomies with implant reconstruction 1992 for high risk disease; right breast invasive lobular carcinoma, s/p right breast lumpectomy, removal or right breast implant and capsulectomy 02/05/20; s/p radiation 03/13/20-04/23/20, she declined anti-estrogen therapy; s/p breast reconstruction with placement of tissue expander 01/20/21 with breat implants 05/05/21), neck surgery (C6-7 ACDF 06/16/00), back surgery.   EP ICD Perioperative Recommendations: Device Information: Clinic EP Physician:  Danelle Birmingham, MD  Device Type:  Defibrillator Manufacturer and Phone #:  Medtronic: [phone] Pacemaker Dependent?:  No. Date of Last Device Check:  09/26/2023     Normal Device Function?:  Yes.     Electrophysiologist's Recommendations: Have magnet available. Provide continuous ECG monitoring when magnet is used or reprogramming is to be performed.  Procedure Patricia Alvarado likely interfere with device function.  Device should be programmed:  Tachy therapies disabled   Anesthesia team to evaluate on the day of surgery. Reported last Eliquis  does as 12/01/2023.    VS: BP (!) 126/57   Pulse 66   Temp 36.8 C (Oral)   Resp 18   Ht 5'  4.5 (1.638 m)   Wt 71.7 kg   SpO2 98%   BMI 26.70 kg/m    PROVIDERS: [name], [name], NP  is PCP  - Elmira Penman, MD is primary cardiologist - Birmingham Danelle, MD is EP and has seen Patricia Alvarado, Will, MD if AF/PVC ablations.  [place]EN[place]OD Maryjane Mt, MD is CT surgeon - Odean Potts, MD is HEM-ONC. Last visit 02/04/21. He wrote, no role of imaging since she had bilateral mastectomies Return to clinic on an as-needed basis - Izell Domino, MD is RAD-ONC. Last visit 03/05/20. [place]EN[place]OD Neda Hammond, MD is pulmonologist. Last evaluation 08/15/20 for abnormal chest CT. Findings felt to represent postradiation inflammatory changes.  He did not feel her shortness of breath was significant enough to warrant use of steroids.  Clinical monitoring recommended, and if any worsening of symptoms PFTs or repeat CT may be considered.  Tentatively 28-month follow-up planned.     LABS: Labs reviewed: Acceptable for surgery. (all labs ordered are listed, but only abnormal results are displayed)  Labs Reviewed  SURGICAL PCR SCREEN  URINALYSIS, ROUTINE W REFLEX MICROSCOPIC  CBC  COMPREHENSIVE METABOLIC PANEL  PROTIME-INR  APTT  HEMOGLOBIN A1C  TYPE AND SCREEN     IMAGES: CT Chest 12/03/2023: In process.    EKG: 11/08/2023: Normal sinus rhythm Normal ECG When compared with ECG of 29-Dec-2022 05:47, Sinus rhythm has replaced Electronic atrial pacemaker Confirmed by Elmira Penman [phone]) on 11/08/2023 3:25:51 PM   CV: US  Carotid 12/03/2023: Summary:  - Right Carotid: The extracranial vessels were near-normal with only minimal  wall  thickening or plaque.  - Left Carotid: Velocities in the left ICA are consistent with a 1-39%  stenosis.  -  Vertebrals: Bilateral vertebral arteries demonstrate antegrade flow.  - Subclavians: Normal flow hemodynamics were seen in bilateral subclavian arteries.    RHC/LHC 11/11/2023:   Prox RCA lesion is 10% stenosed.   Mild non-obstructive CAD Normal right and left  heart pressures   Recommendations: Continue planning for mitral valve surgery   TEE 10/06/2023: Native mitral valve, predominantly degenerative, bileaflet prolapse but predominantly posterior leaflet (involving the P1, P2, P3 scallops and in some views A2 scallop as well).  Multiple jets centrally directed consistent with severe mitral regurgitation. No valvular stenosis. Quantitative evaluation likely to underestimate the severity of MR due to the presence of multiple jets. Clinical correlation is required.   Cardiac MRI 12/25/2022: IMPRESSION: - Mital Annular disjunction noted. Cannot exclude small amount of LGE at the area of posterior disjunction. There is no papillary muscle VT. Based on study suspect posterior focus of ventricular tachycardia. - Mild to moderate mitral regurgitation.    CT Cardiac 09/05/2021: IMPRESSION: 1. There is normal pulmonary vein drainage into the left atrium with ostial measurements above. 2. There is no thrombus in the left atrial appendage. 3. The esophagus runs towards the left of midline of the left atrium and is in moderate proximity to the LU and LL pulmonary vein ostia 4.  There is evidence of a PFO but no shunting of contrast seen 5. Normal coronary origin. Right dominance. 6. CAC score of 496 which is 87th percentile for age-, race-, and sex-matched controls.    Past Medical History:  Diagnosis Date   A-fib [facility])    AICD (automatic cardioverter/defibrillator) present    Medtronic. Managed by Dr. Danelle Birmingham   Anxiety    Arthritis    thumb   Basal cell carcinoma 2001   forehead, between eyebrows right arm (2022)   Breast cancer (HCC)    Carcinoma of thyroid  gland (HCC) 2001   Chronic lower back pain    worse is across my hips (05/26/2016)   Depression    Dyslipidemia    Dyspnea    Dysrhythmia    A. Fib   Family history of breast cancer    Family history of kidney cancer    Family history of leukemia    Family history  of nonmelanoma skin cancer    Fibrocystic breast    Fibromyalgia    GERD (gastroesophageal reflux disease)    Heart murmur    History of blood transfusion 1992   after subcutaneous mastectomies   History of hiatal hernia    Hypertension    not on any medications   Hypothyroidism    Malignant melanoma of left ankle (HCC) 2001   Migraine    visual; 2-3 times/year (05/26/2016)   Mitral valve prolapse    PONV (postoperative nausea and vomiting)    Presence of permanent cardiac pacemaker    Medtronic   Ventricular tachycardia (HCC)    Hx of, controlled on sotalol  therapy    Past Surgical History:  Procedure Laterality Date   ABDOMINAL HYSTERECTOMY  1998   ANTERIOR CERVICAL DECOMP/DISCECTOMY FUSION  2001   ATRIAL FIBRILLATION ABLATION N/A 09/12/2021   Procedure: ATRIAL FIBRILLATION ABLATION;  Surgeon: Inocencio Soyla Lunger, MD;  [facility] INVASIVE [facility];  Service: Cardiovascular;  Laterality: N/A;   BASAL CELL CARCINOMA EXCISION  2001   cut it out & did a flap, on forehead between my eyebrows   BREAST IMPLANT EXCHANGE Bilateral 2001   [id number]   BREAST IMPLANT EXCHANGE Left 05/05/2021   Procedure: BREAST IMPLANT EXCHANGE;  Surgeon: Lowery Estefana RAMAN, DO;  Location: [facility];  Service: Plastics;  Laterality: Left;   BREAST IMPLANT REMOVAL Right 02/05/2020   Procedure: REMOVAL RIGHT BREAST IMPLANT AND CAPSULECTOMY;  Surgeon: Curvin Deward MOULD, MD;  Location: [facility];  Service: General;  Laterality: Right;   BREAST LUMPECTOMY Right 02/05/2020   Procedure: RIGHT BREAST CENTRAL LUMPECTOMY;  Surgeon: Curvin Deward MOULD, MD;  Location: [facility];  Service: General;  Laterality: Right;   BREAST RECONSTRUCTION WITH PLACEMENT OF TISSUE EXPANDER AND FLEX HD (ACELLULAR HYDRATED DERMIS) Right 01/20/2021   Procedure: BREAST RECONSTRUCTION WITH PLACEMENT OF TISSUE EXPANDER AND FLEX HD (ACELLULAR HYDRATED DERMIS);  Surgeon: Lowery Estefana RAMAN, DO;  [facility];  Service: Plastics;  Laterality: Right;  2 hours   CARPAL TUNNEL RELEASE Right 2006   COLONOSCOPY     DILATION AND CURETTAGE OF UTERUS  1970s X 2-3   ELECTROPHYSIOLOGIC STUDY  1994 X 2;2001   to see it it was sustained VT; cause thyroid  levels were causing arrhythmias   EYE SURGERY  12/2020   cataracts   HYSTEROTOMY  1994   ICD IMPLANT N/A 12/28/2022   Procedure: ICD IMPLANT;  Surgeon: Waddell Danelle ORN, MD;  Location: [facility] INVASIVE CV LAB;  Service: Cardiovascular;  Laterality: N/A;   LAPAROSCOPIC CHOLECYSTECTOMY  2004   MASTECTOMY Bilateral 1992   subcutaneous   MELANOMA EXCISION Left 2001   ankle, stage I   PLACEMENT OF BREAST IMPLANTS Bilateral 1992   [id number]   PVC ABLATION N/A 12/23/2022   Procedure: PVC ABLATION;  Surgeon: Inocencio Soyla Lunger, MD;  [facility];  Service: Cardiovascular;  Laterality: N/A;   REMOVAL OF TISSUE EXPANDER AND PLACEMENT OF IMPLANT Right 05/05/2021   Procedure: REMOVAL OF TISSUE EXPANDER AND PLACEMENT OF IMPLANT;  Surgeon: Lowery Estefana RAMAN, DO;  [facility];  Service: Plastics;  Laterality: Right;   RIGHT/LEFT HEART CATH AND CORONARY ANGIOGRAPHY N/A 11/11/2023   Procedure: RIGHT/LEFT HEART CATH AND CORONARY ANGIOGRAPHY;  Surgeon: Verlin Lonni BIRCH, MD;  [facility];  Service: Cardiovascular;  Laterality: N/A;   TOTAL THYROIDECTOMY  12/1999   cancer   TRANSESOPHAGEAL ECHOCARDIOGRAM (CATH LAB) N/A 10/06/2023   Procedure: TRANSESOPHAGEAL ECHOCARDIOGRAM;  Surgeon: Michele Richardson, DO;  [facility];  Service: Cardiovascular;  Laterality: N/A;   VENTRICULAR ABLATION SURGERY  2011   ventricular tchycardia    MEDICATIONS:  acetaminophen  (TYLENOL ) 500 MG tablet   ALPRAZolam  (XANAX ) 0.5 MG tablet   apixaban  (ELIQUIS ) 5 MG TABS tablet   Ascorbic Acid (VITAMIN C) 1000 MG tablet   B Complex-C (SUPER B COMPLEX PO)   beta carotene 10000 UNIT capsule   Biotin 5 MG CAPS   Calcium  600-200  MG-UNIT tablet   cetirizine (ZYRTEC) 10 MG tablet   Cholecalciferol  (VITAMIN D) 50 MCG (2000 UT) CAPS   Coenzyme Q10 (COQ-10) 100 MG CAPS   Digestive Enzyme CAPS   escitalopram  (LEXAPRO ) 20 MG tablet   ezetimibe  (ZETIA ) 10 MG tablet   famotidine  (PEPCID ) 20 MG tablet   fluticasone  (FLONASE ) 50 MCG/ACT nasal spray   furosemide  (LASIX ) 20 MG tablet   hydrocortisone cream 1 %   MAGNESIUM  MALATE PO   MANGANESE  PO   Melatonin 10 MG TABS   Menatetrenone (VITAMIN K2) 100 MCG TABS   methocarbamol  (ROBAXIN ) 500 MG tablet   omeprazole  (PRILOSEC) 40 MG capsule   Polyvinyl Alcohol (LIQUID TEARS OP)   potassium chloride  SA (KLOR-CON  M) 20 MEQ  tablet   Simethicone  125 MG CAPS   sotalol  (BETAPACE ) 160 MG tablet   thyroid  (ARMOUR) 120 MG tablet   triamcinolone  (KENALOG ) 0.025 % cream   zinc  gluconate 50 MG tablet   No current facility-administered medications for this encounter.    Isaiah Ruder, PA-C Surgical Short Stay/Anesthesiology [facility] Phone [phone] [facility] Phone [phone] 12/06/2023 11:40 AM

## 2023-12-06 NOTE — H&P (Signed)
 [address]       [place] [address]             [phone]                                   Chardonae Oller [place] Medical Record #[id number] Date of Birth: Oct 07, 1946   Tammie Fall, MD Olga Berthold, NP   Chief Complaint: DOE and MV regurgitation     History of Present Illness:     Pt is a 78 yo female who has had a complicated cardiac history over the past several years. Pt was found to have afib in 2022 and underwent ablation procedure with success, but has had a life long issue with PVCs but this worsened to the point of requiring attempted Ventricular ablation that was not felt possible and ended up with AICD. She is still on eliquis  and has more irregular beats at night but overall in NSR. She was noted to have MR and has been mild for many years. However she has been now found to have severe MR from multiple jets from a myxomatous MV with primarily P1,2 and 3 prolapse but also anterior as well. She has annular disjunction. She was felt best served with MV surgery and Cath was performed without CAD. Pt has no PHTN. Of note pt had bilateral mastectomy preventively in 1992 but developed nipple cancer and had resection and RT therapy in 2022 with now RT injury to her back skin and Right lung. She feels this may also be contributing to her SOB.             Past Medical History:  Diagnosis Date   A-fib [facility])     Arthritis      thumb   Basal cell carcinoma 2001    "forehead, between eyebrows" right arm (2022)   Breast cancer (HCC)     Carcinoma of thyroid  gland (HCC) 2001   Chronic lower back pain      "worse is across my hips" (05/26/2016)   Depression     Dyslipidemia     Dyspnea     Family history of breast cancer     Family history of kidney cancer     Family history of leukemia     Family history of nonmelanoma skin cancer     Fibrocystic breast     Fibromyalgia     GERD (gastroesophageal reflux disease)     Heart murmur     History  of blood transfusion 1992    "after subcutaneous mastectomies"   Hypertension      not on any medications   Hypothyroidism     Malignant melanoma of left ankle (HCC) 2001   Migraine      "visual; 2-3 times/year" (05/26/2016)   Mitral valve prolapse     PONV (postoperative nausea and vomiting)     Ventricular tachycardia (HCC)      Hx of, controlled on sotalol  therapy               Past Surgical History:  Procedure Laterality Date   ABDOMINAL HYSTERECTOMY   1998   ANTERIOR CERVICAL DECOMP/DISCECTOMY FUSION   2001   ATRIAL FIBRILLATION ABLATION N/A 09/12/2021    Procedure: ATRIAL FIBRILLATION ABLATION;  Surgeon: Lei Pump, MD;  Location: [facility];  Service: Cardiovascular;  Laterality: N/A;  BACK SURGERY       BASAL CELL CARCINOMA EXCISION   2001    "cut it out & did a flap, on forehead between my eyebrows"   BREAST IMPLANT EXCHANGE Bilateral 2001    [id number]   BREAST IMPLANT EXCHANGE Left 05/05/2021    Procedure: BREAST IMPLANT EXCHANGE;  Surgeon: Thornell Flirt, DO;  Location: [facility] OR;  Service: Plastics;  Laterality: Left;   BREAST IMPLANT REMOVAL Right 02/05/2020    Procedure: REMOVAL RIGHT BREAST IMPLANT AND CAPSULECTOMY;  Surgeon: Caralyn Chandler, MD;  Location: [facility];  Service: General;  Laterality: Right;   BREAST LUMPECTOMY Right 02/05/2020    Procedure: RIGHT BREAST CENTRAL LUMPECTOMY;  Surgeon: Caralyn Chandler, MD;  Location: [facility];  Service: General;  Laterality: Right;   BREAST RECONSTRUCTION WITH PLACEMENT OF TISSUE EXPANDER AND FLEX HD (ACELLULAR HYDRATED DERMIS) Right 01/20/2021    Procedure: BREAST RECONSTRUCTION WITH PLACEMENT OF TISSUE EXPANDER AND FLEX HD (ACELLULAR HYDRATED DERMIS);  Surgeon: Thornell Flirt, DO;  Location: [facility];  Service: Plastics;  Laterality: Right;  2 hours   CARPAL TUNNEL RELEASE Right 2006   COLONOSCOPY       DILATION AND CURETTAGE OF UTERUS   1970s X 2-3    ELECTROPHYSIOLOGIC STUDY   1994 X 2;2001    "to see it it was sustained VT; cause thyroid  levels were causing arrhythmias"   EYE SURGERY   12/2020    cataracts   ICD IMPLANT N/A 12/28/2022    Procedure: ICD IMPLANT;  Surgeon: Tammie Fall, MD;  Location: [facility] INVASIVE CV LAB;  Service: Cardiovascular;  Laterality: N/A;   LAPAROSCOPIC CHOLECYSTECTOMY       MASTECTOMY Bilateral 1992    "subcutaneous"   MELANOMA EXCISION Left 2001    "ankle, stage I"   PLACEMENT OF BREAST IMPLANTS Bilateral 1992    [id number]   PVC ABLATION N/A 12/23/2022    Procedure: PVC ABLATION;  Surgeon: Lei Pump, MD;  [facility]: [facility];  Service: Cardiovascular;  Laterality: N/A;   REMOVAL OF TISSUE EXPANDER AND PLACEMENT OF IMPLANT Right 05/05/2021    Procedure: REMOVAL OF TISSUE EXPANDER AND PLACEMENT OF IMPLANT;  Surgeon: Thornell Flirt, DO;  Location: [facility];  Service: Plastics;  Laterality: Right;   RIGHT/LEFT HEART CATH AND CORONARY ANGIOGRAPHY N/A 11/11/2023    Procedure: RIGHT/LEFT HEART CATH AND CORONARY ANGIOGRAPHY;  Surgeon: Odie Benne, MD;  [facility]: [facility];  Service: Cardiovascular;  Laterality: N/A;   TOTAL THYROIDECTOMY   12/1999    "cancer"   TRANSESOPHAGEAL ECHOCARDIOGRAM (CATH LAB) N/A 10/06/2023    Procedure: TRANSESOPHAGEAL ECHOCARDIOGRAM;  Surgeon: Olinda Bertrand, DO;  Location: [facility];  Service: Cardiovascular;  Laterality: N/A;   VENTRICULAR ABLATION SURGERY   2011    ventricular tchycardia          Tobacco Use History  Social History        Tobacco Use  Smoking Status Never   Passive exposure: Never  Smokeless Tobacco Never      Social History       Substance and Sexual Activity  Alcohol Use No      Social History         Socioeconomic History   Marital status: Married      Spouse name: Education officer, community (Spouse)   Number of children: Not on file   Years of education: Not on file   Highest education level: Not on  file  Occupational History   Occupation: Adult nurse GI      Employer: [employer]  Tobacco Use   Smoking status: Never      Passive exposure: Never   Smokeless tobacco: Never  Vaping Use   Vaping status: Never Used  Substance and Sexual Activity   Alcohol use: No   Drug use: No   Sexual activity: Yes  Other Topics Concern   Not on file  Social History Narrative    Married    Social Drivers of Health        Financial Resource Strain: Not on file  Food Insecurity: No Food Insecurity (12/26/2022)    Hunger Vital Sign     Worried About Running Out of Food in the Last Year: Never true     Ran Out of Food in the Last Year: Never true  Transportation Needs: No Transportation Needs (12/26/2022)    PRAPARE - Therapist, art (Medical): No     Lack of Transportation (Non-Medical): No  Physical Activity: Not on file  Stress: Not on file  Social Connections: Not on file  Intimate Partner Violence: Not At Risk (12/26/2022)    Humiliation, Afraid, Rape, and Kick questionnaire     Fear of Current or Ex-Partner: No     Emotionally Abused: No     Physically Abused: No     Sexually Abused: No      Allergies       Allergies  Allergen Reactions   Quinidine Palpitations      REACTION: increased heart rate   Statins        Muscle pain   Tape        If left on long enough,will tear skin    Amoxicillin Itching and Rash   Atenolol Hives   Dofetilide  Other (See Comments)      Elevated QTCs   Mexiletine Hcl Palpitations      Dizzy and extreme tiredness   Nadolol Hives   Vicodin  [Hydrocodone -Acetaminophen ] Swelling      Facial redness and swelling              Current Outpatient Medications  Medication Sig Dispense Refill   acetaminophen  (TYLENOL ) 500 MG tablet Take 1,000 mg by mouth every 6 (six) hours as needed for mild pain or moderate pain.       ALPRAZolam  (XANAX ) 0.5 MG tablet Take 1 tablet (0.5 mg total) by mouth at bedtime as needed for sleep (Patient  taking differently: Take 0.25 mg by mouth at bedtime.) 30 tablet 1   apixaban  (ELIQUIS ) 5 MG TABS tablet Take 1 tablet (5 mg total) by mouth 2 (two) times daily. 180 tablet 1   Ascorbic Acid (VITAMIN C) 1000 MG tablet Take 1,000 mg by mouth daily.       B Complex-C (SUPER B COMPLEX PO) Take 1 capsule by mouth at bedtime.       beta carotene 10000 UNIT capsule Take 10,000 Units by mouth at bedtime.       Biotin 5 MG CAPS Take 5 mg by mouth at bedtime.        Calcium  600-200 MG-UNIT tablet Take 1 tablet by mouth daily.       cetirizine (ZYRTEC) 10 MG tablet Take 10 mg by mouth at bedtime.       Cholecalciferol  (VITAMIN D) 50 MCG (2000 UT) CAPS Take 2,000 Units by mouth daily.       Coenzyme Q10 (COQ-10) 100 MG  CAPS Take 100 mg by mouth daily.       Digestive Enzyme CAPS Take 1 capsule by mouth daily as needed (heartburn).       escitalopram  (LEXAPRO ) 20 MG tablet Take 1 tablet (20 mg total) by mouth daily. 90 tablet 4   Evolocumab  (REPATHA  SURECLICK) 140 MG/ML SOAJ Inject 140 mg into the skin every 14 (fourteen) days. (Patient not taking: Reported on 11/08/2023) 6 mL 3   ezetimibe  (ZETIA ) 10 MG tablet Take 1 tablet (10 mg total) by mouth daily. 90 tablet 4   famotidine  (PEPCID ) 20 MG tablet Take 20 mg by mouth as needed for heartburn or indigestion.       fluticasone  (FLONASE ) 50 MCG/ACT nasal spray Place 1 spray into both nostrils daily as needed for allergies.       furosemide  (LASIX ) 20 MG tablet Take 20 mg by mouth daily as needed for fluid (1-2lb weight gain overnight).       hydrocortisone cream 1 % Apply 1 Application topically daily as needed for itching.       Magnesium  (CVS TRIPLE MAGNESIUM  COMPLEX) 400 MG CAPS Take 400 mg by mouth daily.       MAGNESIUM  MALATE PO Take 1,000 mg by mouth daily.       magnesium  oxide (MAG-OX) 400 MG tablet Take 1 tablet (400 mg total) by mouth daily. 30 tablet 11   MANGANESE  PO Take 40 mg by mouth at bedtime.       Melatonin 10 MG TABS Take 10 mg by mouth  at bedtime.        Menatetrenone (VITAMIN K2) 100 MCG TABS Take 100 mcg by mouth daily.       methocarbamol  (ROBAXIN ) 500 MG tablet Take 1 tablet (500 mg total) by mouth every 8 (eight) hours as needed for neck pain 30 tablet 1   omeprazole  (PRILOSEC) 40 MG capsule Take 1 capsule (40 mg total) by mouth every morning 30 minutes before breakfast 30 capsule 3   Polyvinyl Alcohol (LIQUID TEARS OP) Place 1 drop into both eyes daily as needed (dry eyes).       potassium chloride  SA (KLOR-CON  M) 20 MEQ tablet Take 1 tablet (20 mEq total) by mouth 2 (two) times daily. (Patient taking differently: Take 20 mEq by mouth daily as needed (Low potassium).) 60 tablet 11   Simethicone  125 MG CAPS Take 250 mg by mouth daily as needed (bloating).       sotalol  (BETAPACE ) 160 MG tablet Take 1 tablet (160 mg total) by mouth 2 (two) times daily. 180 tablet 2   thyroid  (ARMOUR) 120 MG tablet Take 120 mg by mouth daily before breakfast.       triamcinolone  (KENALOG ) 0.025 % cream Apply 1 Application topically 2 (two) times daily as needed (rash).       zinc  gluconate 50 MG tablet Take 50 mg by mouth daily.          No current facility-administered medications for this visit.               Family History  Problem Relation Age of Onset   Other Brother          AGENT ORANGE and AntiLupus   Heart disease Brother          Stents and bypass x2   Arrhythmia Brother          AFIB   Heart attack Brother     Hypertension Brother     Squamous cell  carcinoma Brother 20        Skin   Hyperlipidemia Mother 69   Osteoporosis Mother     Basal cell carcinoma Mother 71   Heart disease Father 18   Other Father          Cardiac arrest   Breast cancer Maternal Grandmother 35        metastatic   Breast cancer Other          dx. unknown age; maternal great-aunt   Lung cancer Maternal Aunt 64        hx. of smoking   Other Paternal Aunt          bilateral mastectomies due to multiple breast lumps   Leukemia Maternal  Aunt     Cancer Maternal Aunt          unknown types   Thyroid  cancer Neg Hx                  Physical Exam: Lungs; clear Card: very irregular with soft systolic murmur Ext: mild edema Neuro: intact Teeth in good repair         Diagnostic Studies & Laboratory data: I have personally reviewed the following studies and agree with the findings   TTE(08/2023) IMPRESSIONS     1. Left ventricular ejection fraction, by estimation, is 55 to 60%. The  left ventricle has normal function. The left ventricle has no regional  wall motion abnormalities. There is mild left ventricular hypertrophy.  Left ventricular diastolic parameters  were normal. Elevated left ventricular end-diastolic pressure. The average  left ventricular global longitudinal strain is -15.0 %. The global  longitudinal strain is abnormal.   2. Right ventricular systolic function is normal. The right ventricular  size is normal. There is mildly elevated pulmonary artery systolic  pressure.   3. Left atrial size was severely dilated.   4. The mitral valve is myxomatous. Moderate to severe mitral valve  regurgitation. No evidence of mitral stenosis. There is prolapse of both  leaflets of the mitral valve.   5. The aortic valve is tricuspid. There is mild thickening of the aortic  valve. Aortic valve regurgitation is moderate. No aortic stenosis is  present.   6. Aortic Normal DTA.   7. The inferior vena cava is normal in size with greater than 50%  respiratory variability, suggesting right atrial pressure of 3 mmHg.   FINDINGS   Left Ventricle: Left ventricular ejection fraction, by estimation, is 55  to 60%. The left ventricle has normal function. The left ventricle has no  regional wall motion abnormalities. The average left ventricular global  longitudinal strain is -15.0 %.  The global longitudinal strain is abnormal. The left ventricular internal  cavity size was normal in size. There is mild left  ventricular  hypertrophy. Left ventricular diastolic parameters were normal. Elevated  left ventricular end-diastolic pressure.   Right Ventricle: The right ventricular size is normal. No increase in  right ventricular wall thickness. Right ventricular systolic function is  normal. There is mildly elevated pulmonary artery systolic pressure. The  tricuspid regurgitant velocity is 2.92   m/s, and with an assumed right atrial pressure of 3 mmHg, the estimated  right ventricular systolic pressure is 37.1 mmHg.   Left Atrium: Left atrial size was severely dilated.   Right Atrium: Right atrial size was normal in size.   Pericardium: There is no evidence of pericardial effusion.   Mitral Valve: The mitral valve is myxomatous. There is prolapse  of both  leaflets of the mitral valve. There is moderate thickening of the mitral  valve leaflet(s). Mild mitral annular calcification. Moderate to severe  mitral valve regurgitation. No  evidence of mitral valve stenosis.   Tricuspid Valve: The tricuspid valve is normal in structure. Tricuspid  valve regurgitation is mild . No evidence of tricuspid stenosis.   Aortic Valve: The aortic valve is tricuspid. There is mild thickening of  the aortic valve. Aortic valve regurgitation is moderate. Aortic  regurgitation PHT measures 452 msec. No aortic stenosis is present.   Pulmonic Valve: The pulmonic valve was normal in structure. Pulmonic valve  regurgitation is mild. No evidence of pulmonic stenosis.   Aorta: The aortic arch was not well visualized, the aortic root and  ascending aorta are structurally normal, with no evidence of dilitation  and Normal DTA.   Venous: A systolic blunting flow pattern is recorded from the right upper  pulmonary vein. The inferior vena cava is normal in size with greater than  50% respiratory variability, suggesting right atrial pressure of 3 mmHg.   IAS/Shunts: No atrial level shunt detected by color flow Doppler.    Additional Comments: A device lead is visualized in the right ventricle.     LEFT VENTRICLE  PLAX 2D  LVIDd:         5.30 cm   Diastology  LVIDs:         4.00 cm   LV e' medial:    8.59 cm/s  LV PW:         1.30 cm   LV E/e' medial:  9.1  LV IVS:        1.20 cm   LV e' lateral:   11.90 cm/s  LVOT diam:     2.00 cm   LV E/e' lateral: 6.6  LV SV:         55  LV SV Index:   32        2D Longitudinal Strain  LVOT Area:     3.14 cm  2D Strain GLS Avg:     -15.0 %     RIGHT VENTRICLE             IVC  RV Basal diam:  3.20 cm     IVC diam: 1.40 cm  RV S prime:     15.70 cm/s  TAPSE (M-mode): 2.1 cm   LEFT ATRIUM             Index        RIGHT ATRIUM           Index  LA diam:        4.50 cm 2.59 cm/m   RA Area:     15.60 cm  LA Vol (A2C):   92.6 ml 53.36 ml/m  RA Volume:   39.00 ml  22.47 ml/m  LA Vol (A4C):   81.0 ml 46.67 ml/m  LA Biplane Vol: 92.6 ml 53.36 ml/m   AORTIC VALVE  LVOT Vmax:   82.73 cm/s  LVOT Vmean:  55.333 cm/s  LVOT VTI:    0.176 m  AI PHT:      452 msec    AORTA  Ao Root diam: 2.90 cm  Ao Asc diam:  3.10 cm  Ao Desc diam: 2.30 cm   MR Peak grad:    144.5 mmHg   TRICUSPID VALVE  MR Mean grad:    104.0 mmHg   TR Peak grad:   34.1 mmHg  MR Vmax:         601.00 cm/s  TR Vmax:        292.00 cm/s  MR Vmean:        490.0 cm/s  MR PISA:         5.67 cm     SHUNTS  MR PISA Eff ROA: 36 mm       Systemic VTI:  0.18 m  MR PISA Radius:  0.95 cm      Systemic Diam: 2.00 cm  MV E velocity: 78.10 cm/s  MV A velocity: 58.20 cm/s  MV E/A ratio:  1.34    TEE (09/2023) IMPRESSIONS     1. Left ventricular ejection fraction, by estimation, is 60 to 65%. The  left ventricle has normal function. The left ventricle has no regional  wall motion abnormalities. Left ventricular diastolic parameters are  consistent with Grade III diastolic  dysfunction (restrictive). Elevated left atrial pressure. The E/e' is 16.   2. Right ventricular systolic function is normal.  The right ventricular  size is normal. There is normal pulmonary artery systolic pressure.   3. Left atrial size was severely dilated. No left atrial/left atrial  appendage thrombus was detected. The LAA emptying velocity was 44 cm/s.   4. Native mitral valve, predominantly degenerative, bileaflet prolapse  but predominantly posterior leaflet (involving the P1, P2, P3 scallops and  in some views A2 scallop as well). Multiple jets centrally directed. No  valvular stenosis.   5. Tricuspid valve regurgitation is mild to moderate.   6. The aortic valve is tricuspid. Aortic valve regurgitation is mild to  moderate. No aortic stenosis is present.   7. The inferior vena cava is dilated in size with >50% respiratory  variability, suggesting right atrial pressure of 8 mmHg.   8. Agitated saline contrast bubble study was negative, with no evidence  of any interatrial shunt.   9. Rhythm strip during this exam demonstrates normal sinus rhythm.   FINDINGS   Left Ventricle: Left ventricular ejection fraction, by estimation, is 60  to 65%. The left ventricle has normal function. The left ventricle has no  regional wall motion abnormalities. The left ventricular internal cavity  size was normal in size. There is   no left ventricular hypertrophy. Left ventricular diastolic parameters  are consistent with Grade III diastolic dysfunction (restrictive). The  E/e' is 16.   Right Ventricle: The right ventricular size is normal. No increase in  right ventricular wall thickness. Right ventricular systolic function is  normal. There is normal pulmonary artery systolic pressure. The tricuspid  regurgitant velocity is 2.21 m/s, and   with an assumed right atrial pressure of 8 mmHg, the estimated right  ventricular systolic pressure is 27.5 mmHg.   Left Atrium: Left atrial size was severely dilated. No left atrial/left  atrial appendage thrombus was detected. The LAA emptying velocity was 44  cm/s.   Right  Atrium: Right atrial size was normal in size.   Pericardium: There is no evidence of pericardial effusion.   Mitral Valve: Native mitral valve, predominantly degenerative, bileaflet  prolapse but predominantly posterior leaflet (involving the P1, P2, P3  scallops and in some views A2 scallop as well). Multiple jets centrally  directed. No valvular stenosis. Mild  mitral annular calcification.   Tricuspid Valve: The tricuspid valve is normal in structure. Tricuspid  valve regurgitation is mild to moderate. No evidence of tricuspid  stenosis.   Aortic Valve: The aortic valve is tricuspid. Aortic valve regurgitation  is  mild to moderate. No aortic stenosis is present.   Pulmonic Valve: The pulmonic valve was normal in structure. Pulmonic valve  regurgitation is mild. No evidence of pulmonic stenosis.   Aorta: The aortic root is normal in size and structure.   Venous: A systolic blunting flow pattern is recorded from the left upper  pulmonary vein and the left lower pulmonary vein. The inferior vena cava  is dilated in size with greater than 50% respiratory variability,  suggesting right atrial pressure of 8  mmHg.   IAS/Shunts: No atrial level shunt detected by color flow Doppler. Agitated  saline contrast was given intravenously to evaluate for intracardiac  shunting. Agitated saline contrast bubble study was negative, with no  evidence of any interatrial shunt.   EKG: Rhythm strip during this exam demonstrates normal sinus rhythm.   Additional Comments: A device lead is visualized.   LEFT VENTRICLE  PLAX 2D  LVIDd:         5.30 cm   Diastology  LVIDs:         3.20 cm   LV e' medial:    5.55 cm/s  LV PW:         1.00 cm   LV E/e' medial:  16.9  LV IVS:        0.90 cm   LV e' lateral:   5.98 cm/s  LVOT diam:     1.90 cm   LV E/e' lateral: 15.7  LV SV:         62  LV SV Index:   35  LVOT Area:     2.84 cm     AORTIC VALVE  LVOT Vmax:   110.00 cm/s  LVOT Vmean:  74.700  cm/s  LVOT VTI:    0.217 m   MITRAL VALVE                  TRICUSPID VALVE  MV Area (PHT): 3.83 cm       TR Peak grad:   19.5 mmHg  MV Decel Time: 198 msec       TR Vmax:        221.00 cm/s  MR Peak grad:    120.6 mmHg  MR Mean grad:    65.0 mmHg    SHUNTS  MR Vmax:         549.00 cm/s  Systemic VTI:  0.22 m  MR Vmean:        369.7 cm/s   Systemic Diam: 1.90 cm  MR PISA:         1.90 cm  MR PISA Eff ROA: 13 mm  MR PISA Radius:  0.55 cm  MV E velocity: 93.80 cm/s  MV A velocity: 41.20 cm/s  MV E/A ratio:  2.28    CATH (10/2023) Conclusion       Prox RCA lesion is 10% stenosed.   Mild non-obstructive CAD Normal right and left heart pressures  Hemo Data   Flowsheet Row Most Recent Value  Fick Cardiac Output 4.1 L/min  Fick Cardiac Output Index 2.35 (L/min)/BSA  Aortic Mean Gradient 1.05 mmHg  Aortic Peak Gradient 0.3 mmHg  Aortic Valve Area >3.50  Aortic Value Area Index 2 cm2/BSA  RA A Wave 6 mmHg  RA V Wave 4 mmHg  RA Mean 1 mmHg  RV Systolic Pressure 33 mmHg  RV Diastolic Pressure -6 mmHg  RV EDP 9 mmHg  PA Systolic Pressure 33 mmHg  PA Diastolic Pressure 2 mmHg  PA Mean 18 mmHg  PW A Wave 12 mmHg  PW V Wave 19 mmHg  PW Mean 10 mmHg  LV Systolic Pressure 139 mmHg  LV Diastolic Pressure 1 mmHg  LV EDP 7 mmHg  Arterial Occlusion Pressure Extended Systolic Pressure 136 mmHg  Arterial Occlusion Pressure Extended Diastolic Pressure 50 mmHg  Arterial Occlusion Pressure Extended Mean Pressure 85 mmHg  Left Ventricular Apex Extended Systolic Pressure 133 mmHg  LVp Diastolic Pressure -1 mmHg  Left Ventricular Apex Extended EDP Pressure 8 mmHg  QP/QS 1  TPVR Index 7.68 HRUI     Recent Radiology Findings:        Recent Lab Findings: Recent Labs       Lab Results  Component Value Date    WBC 6.3 11/08/2023    HGB 11.2 (L) 11/11/2023    HCT 33.0 (L) 11/11/2023    PLT 216 11/08/2023    GLUCOSE 103 (H) 11/08/2023    CHOL 155 08/21/2022    TRIG 94 08/21/2022     HDL 74 08/21/2022    LDLCALC 64 08/21/2022    ALT 16 08/06/2020    AST 18 08/06/2020    NA 142 11/11/2023    K 3.5 11/11/2023    CL 101 11/08/2023    CREATININE 0.63 11/08/2023    BUN 23 11/08/2023    CO2 25 11/08/2023    TSH 0.130 (L) 05/26/2016    INR 1.0 ratio 01/14/2010            Assessment / Plan:     78 yo female with NYHA class 2 symptoms of severe MR with normal LV function and no CAD or PHTN with long history of arrhythmias sp atrial ablation and with AICD. Pt has a class I indication for MV surgery and hopefully with repair however with extensive prolapse of entire posterior leaflet and anterior myxomatous valve may be more difficult and tissue valve replacement would be fall back. She understands all the risks and goals and recovery from surgery with slightly higher wound complications secondary to her RT therapy and wishes to proceed on 2/11. Will need to be off eliquis  for 5 days and have AICD arranged to be turned off at time of surgery. We will obtain noncontrast CT of chest and carotid duplex prior to surgery

## 2023-12-06 NOTE — Anesthesia Preprocedure Evaluation (Addendum)
 Anesthesia Evaluation  Patient identified by MRN, date of birth, ID band Patient awake    Reviewed: Allergy & Precautions, H&P , NPO status , Patient's Chart, lab work & pertinent test results  History of Anesthesia Complications (+) PONV and history of anesthetic complications  Airway Mallampati: II  TM Distance: >3 FB Neck ROM: Full    Dental no notable dental hx.    Pulmonary shortness of breath   Pulmonary exam normal breath sounds clear to auscultation       Cardiovascular hypertension, Normal cardiovascular exam+ dysrhythmias + pacemaker + Cardiac Defibrillator + Valvular Problems/Murmurs MR  Rhythm:Regular Rate:Normal  IMPRESSIONS     1. Left ventricular ejection fraction, by estimation, is 60 to 65%. The  left ventricle has normal function. The left ventricle has no regional  wall motion abnormalities. Left ventricular diastolic parameters are  consistent with Grade III diastolic  dysfunction (restrictive). Elevated left atrial pressure. The E/e' is 16.   2. Right ventricular systolic function is normal. The right ventricular  size is normal. There is normal pulmonary artery systolic pressure.   3. Left atrial size was severely dilated. No left atrial/left atrial  appendage thrombus was detected. The LAA emptying velocity was 44 cm/s.   4. Native mitral valve, predominantly degenerative, bileaflet prolapse  but predominantly posterior leaflet (involving the P1, P2, P3 scallops and  in some views A2 scallop as well). Multiple jets centrally directed. No  valvular stenosis.   5. Tricuspid valve regurgitation is mild to moderate.   6. The aortic valve is tricuspid. Aortic valve regurgitation is mild to  moderate. No aortic stenosis is present.   7. The inferior vena cava is dilated in size with >50% respiratory  variability, suggesting right atrial pressure of 8 mmHg.   8. Agitated saline contrast bubble study was  negative, with no evidence  of any interatrial shunt.   9. Rhythm strip during this exam demonstrates normal sinus rhythm.      Neuro/Psych  Headaches PSYCHIATRIC DISORDERS Anxiety Depression       GI/Hepatic Neg liver ROS, hiatal hernia,GERD  ,,  Endo/Other  Hypothyroidism    Renal/GU negative Renal ROS  negative genitourinary   Musculoskeletal  (+) Arthritis ,  Fibromyalgia -  Abdominal   Peds negative pediatric ROS (+)  Hematology negative hematology ROS (+)   Anesthesia Other Findings   Reproductive/Obstetrics negative OB ROS                              Anesthesia Physical Anesthesia Plan  ASA: 4  Anesthesia Plan: General   Post-op Pain Management:    Induction: Intravenous  PONV Risk Score and Plan: 4 or greater and Ondansetron  and Treatment may vary due to age or medical condition  Airway Management Planned: Oral ETT  Additional Equipment: Arterial line, CVP, PA Cath, 3D TEE and Ultrasound Guidance Line Placement  Intra-op Plan:   Post-operative Plan: Post-operative intubation/ventilation  Informed Consent: I have reviewed the patients History and Physical, chart, labs and discussed the procedure including the risks, benefits and alternatives for the proposed anesthesia with the patient or authorized representative who has indicated his/her understanding and acceptance.     Dental advisory given  Plan Discussed with: CRNA  Anesthesia Plan Comments: (PAT note written 12/06/2023 by Allison Zelenak, PA-C.  Patient is a 78 year old female scheduled for the above procedure.  History includes  never smoker, post-operative N/V, murmur/MVP, VT (VT ablation 01/21/10; PVC  ablation 12/23/22, s/p [employer] ICD 12/28/22), PAF (afib ablation 09/12/21), severe mitral regurgitation, HTN, dyslipidemia, dyspnea, GERD, fibromyalgia, thyroid  cancer (s/p thyroidectomy 01/13/00, post-surgical hypothyroidism), skin cancer (BCC & melanoma 2001),  breast cancer (s/ bilateral nipple sparing mastectomies with implant reconstruction 1992 for high risk disease; right breast invasive lobular carcinoma, s/p right breast lumpectomy, removal or right breast implant and capsulectomy 02/05/20; s/p radiation 03/13/20-04/23/20, she declined anti-estrogen therapy; s/p breast reconstruction with placement of tissue expander 01/20/21 with breat implants 05/05/21), neck surgery (C6-7 ACDF 06/16/00), back surgery.  )        Anesthesia Quick Evaluation

## 2023-12-07 ENCOUNTER — Inpatient Hospital Stay (HOSPITAL_COMMUNITY): Payer: PPO

## 2023-12-07 ENCOUNTER — Other Ambulatory Visit: Payer: Self-pay

## 2023-12-07 ENCOUNTER — Encounter (HOSPITAL_COMMUNITY)
Admission: RE | Disposition: A | Discharge status: Home Health | Payer: Self-pay | Source: Home / Self Care | Attending: Thoracic Surgery (Cardiothoracic Vascular Surgery)

## 2023-12-07 ENCOUNTER — Inpatient Hospital Stay (HOSPITAL_COMMUNITY)
Admission: RE | Admit: 2023-12-07 | Discharge: 2023-12-13 | DRG: 219 | Disposition: A | Discharge status: Home Health | Payer: PPO | Attending: Thoracic Surgery (Cardiothoracic Vascular Surgery) | Admitting: Thoracic Surgery (Cardiothoracic Vascular Surgery)

## 2023-12-07 ENCOUNTER — Inpatient Hospital Stay (HOSPITAL_COMMUNITY): Payer: Self-pay | Admitting: Vascular Surgery

## 2023-12-07 ENCOUNTER — Encounter (HOSPITAL_COMMUNITY): Payer: Self-pay | Admitting: Thoracic Surgery (Cardiothoracic Vascular Surgery)

## 2023-12-07 DIAGNOSIS — E8770 Fluid overload, unspecified: Secondary | ICD-10-CM | POA: Diagnosis not present

## 2023-12-07 DIAGNOSIS — Z95810 Presence of automatic (implantable) cardiac defibrillator: Secondary | ICD-10-CM

## 2023-12-07 DIAGNOSIS — Z7952 Long term (current) use of systemic steroids: Secondary | ICD-10-CM

## 2023-12-07 DIAGNOSIS — Z452 Encounter for adjustment and management of vascular access device: Secondary | ICD-10-CM | POA: Diagnosis not present

## 2023-12-07 DIAGNOSIS — J90 Pleural effusion, not elsewhere classified: Secondary | ICD-10-CM | POA: Diagnosis not present

## 2023-12-07 DIAGNOSIS — Z888 Allergy status to other drugs, medicaments and biological substances status: Secondary | ICD-10-CM

## 2023-12-07 DIAGNOSIS — I493 Ventricular premature depolarization: Secondary | ICD-10-CM | POA: Diagnosis present

## 2023-12-07 DIAGNOSIS — F32A Depression, unspecified: Secondary | ICD-10-CM | POA: Diagnosis not present

## 2023-12-07 DIAGNOSIS — Z85850 Personal history of malignant neoplasm of thyroid: Secondary | ICD-10-CM | POA: Diagnosis not present

## 2023-12-07 DIAGNOSIS — R739 Hyperglycemia, unspecified: Secondary | ICD-10-CM | POA: Diagnosis not present

## 2023-12-07 DIAGNOSIS — J9811 Atelectasis: Secondary | ICD-10-CM | POA: Diagnosis not present

## 2023-12-07 DIAGNOSIS — E039 Hypothyroidism, unspecified: Secondary | ICD-10-CM

## 2023-12-07 DIAGNOSIS — Z950 Presence of cardiac pacemaker: Secondary | ICD-10-CM | POA: Diagnosis not present

## 2023-12-07 DIAGNOSIS — Z8249 Family history of ischemic heart disease and other diseases of the circulatory system: Secondary | ICD-10-CM

## 2023-12-07 DIAGNOSIS — F418 Other specified anxiety disorders: Secondary | ICD-10-CM

## 2023-12-07 DIAGNOSIS — Z9013 Acquired absence of bilateral breasts and nipples: Secondary | ICD-10-CM

## 2023-12-07 DIAGNOSIS — I340 Nonrheumatic mitral (valve) insufficiency: Principal | ICD-10-CM | POA: Diagnosis present

## 2023-12-07 DIAGNOSIS — Z880 Allergy status to penicillin: Secondary | ICD-10-CM

## 2023-12-07 DIAGNOSIS — F419 Anxiety disorder, unspecified: Secondary | ICD-10-CM | POA: Diagnosis present

## 2023-12-07 DIAGNOSIS — Z853 Personal history of malignant neoplasm of breast: Secondary | ICD-10-CM

## 2023-12-07 DIAGNOSIS — Z803 Family history of malignant neoplasm of breast: Secondary | ICD-10-CM

## 2023-12-07 DIAGNOSIS — G43909 Migraine, unspecified, not intractable, without status migrainosus: Secondary | ICD-10-CM | POA: Diagnosis present

## 2023-12-07 DIAGNOSIS — D65 Disseminated intravascular coagulation [defibrination syndrome]: Secondary | ICD-10-CM | POA: Diagnosis not present

## 2023-12-07 DIAGNOSIS — I059 Rheumatic mitral valve disease, unspecified: Secondary | ICD-10-CM

## 2023-12-07 DIAGNOSIS — I10 Essential (primary) hypertension: Secondary | ICD-10-CM

## 2023-12-07 DIAGNOSIS — R918 Other nonspecific abnormal finding of lung field: Secondary | ICD-10-CM | POA: Diagnosis not present

## 2023-12-07 DIAGNOSIS — Z90710 Acquired absence of both cervix and uterus: Secondary | ICD-10-CM

## 2023-12-07 DIAGNOSIS — Z91048 Other nonmedicinal substance allergy status: Secondary | ICD-10-CM

## 2023-12-07 DIAGNOSIS — Z9882 Breast implant status: Secondary | ICD-10-CM

## 2023-12-07 DIAGNOSIS — Z9889 Other specified postprocedural states: Secondary | ICD-10-CM | POA: Diagnosis not present

## 2023-12-07 DIAGNOSIS — I4891 Unspecified atrial fibrillation: Secondary | ICD-10-CM

## 2023-12-07 DIAGNOSIS — E785 Hyperlipidemia, unspecified: Secondary | ICD-10-CM | POA: Diagnosis present

## 2023-12-07 DIAGNOSIS — R1314 Dysphagia, pharyngoesophageal phase: Secondary | ICD-10-CM | POA: Diagnosis not present

## 2023-12-07 DIAGNOSIS — Z923 Personal history of irradiation: Secondary | ICD-10-CM

## 2023-12-07 DIAGNOSIS — Z85828 Personal history of other malignant neoplasm of skin: Secondary | ICD-10-CM | POA: Diagnosis not present

## 2023-12-07 DIAGNOSIS — Z808 Family history of malignant neoplasm of other organs or systems: Secondary | ICD-10-CM

## 2023-12-07 DIAGNOSIS — M797 Fibromyalgia: Secondary | ICD-10-CM | POA: Diagnosis not present

## 2023-12-07 DIAGNOSIS — E890 Postprocedural hypothyroidism: Secondary | ICD-10-CM | POA: Diagnosis not present

## 2023-12-07 DIAGNOSIS — K219 Gastro-esophageal reflux disease without esophagitis: Secondary | ICD-10-CM | POA: Diagnosis not present

## 2023-12-07 DIAGNOSIS — J939 Pneumothorax, unspecified: Secondary | ICD-10-CM | POA: Diagnosis not present

## 2023-12-07 DIAGNOSIS — I480 Paroxysmal atrial fibrillation: Secondary | ICD-10-CM | POA: Diagnosis present

## 2023-12-07 DIAGNOSIS — Z7901 Long term (current) use of anticoagulants: Secondary | ICD-10-CM

## 2023-12-07 DIAGNOSIS — R1319 Other dysphagia: Secondary | ICD-10-CM | POA: Diagnosis not present

## 2023-12-07 DIAGNOSIS — R269 Unspecified abnormalities of gait and mobility: Secondary | ICD-10-CM | POA: Diagnosis not present

## 2023-12-07 DIAGNOSIS — Z79899 Other long term (current) drug therapy: Secondary | ICD-10-CM

## 2023-12-07 DIAGNOSIS — Z981 Arthrodesis status: Secondary | ICD-10-CM

## 2023-12-07 DIAGNOSIS — I342 Nonrheumatic mitral (valve) stenosis: Secondary | ICD-10-CM

## 2023-12-07 DIAGNOSIS — Z9049 Acquired absence of other specified parts of digestive tract: Secondary | ICD-10-CM

## 2023-12-07 DIAGNOSIS — Z885 Allergy status to narcotic agent status: Secondary | ICD-10-CM

## 2023-12-07 DIAGNOSIS — Z4682 Encounter for fitting and adjustment of non-vascular catheter: Secondary | ICD-10-CM | POA: Diagnosis not present

## 2023-12-07 DIAGNOSIS — D62 Acute posthemorrhagic anemia: Secondary | ICD-10-CM | POA: Diagnosis not present

## 2023-12-07 HISTORY — PX: MITRAL VALVE REPAIR: SHX2039

## 2023-12-07 HISTORY — PX: TEE WITHOUT CARDIOVERSION: SHX5443

## 2023-12-07 LAB — CBC
HCT: 24.7 % — ABNORMAL LOW (ref 36.0–46.0)
HCT: 25 % — ABNORMAL LOW (ref 36.0–46.0)
Hemoglobin: 8.3 g/dL — ABNORMAL LOW (ref 12.0–15.0)
Hemoglobin: 8.2 g/dL — ABNORMAL LOW (ref 12.0–15.0)
MCH: 31.8 pg (ref 26.0–34.0)
MCH: 31.3 pg (ref 26.0–34.0)
MCHC: 33.6 g/dL (ref 30.0–36.0)
MCHC: 32.8 g/dL (ref 30.0–36.0)
MCV: 94.6 fL (ref 80.0–100.0)
MCV: 95.4 fL (ref 80.0–100.0)
Platelets: 94 10*3/uL — ABNORMAL LOW (ref 150–400)
Platelets: 107 10*3/uL — ABNORMAL LOW (ref 150–400)
RBC: 2.61 MIL/uL — ABNORMAL LOW (ref 3.87–5.11)
RBC: 2.62 MIL/uL — ABNORMAL LOW (ref 3.87–5.11)
RDW: 13.2 % (ref 11.5–15.5)
RDW: 13.2 % (ref 11.5–15.5)
WBC: 7.5 10*3/uL (ref 4.0–10.5)
WBC: 7.7 10*3/uL (ref 4.0–10.5)
nRBC: 0 % (ref 0.0–0.2)
nRBC: 0 % (ref 0.0–0.2)

## 2023-12-07 LAB — POCT I-STAT EG7
Acid-Base Excess: 0 mmol/L (ref 0.0–2.0)
Bicarbonate: 23.9 mmol/L (ref 20.0–28.0)
Calcium, Ion: 1 mmol/L — ABNORMAL LOW (ref 1.15–1.40)
HCT: 24 % — ABNORMAL LOW (ref 36.0–46.0)
Hemoglobin: 8.2 g/dL — ABNORMAL LOW (ref 12.0–15.0)
O2 Saturation: 84 %
Potassium: 4.5 mmol/L (ref 3.5–5.1)
Sodium: 138 mmol/L (ref 135–145)
TCO2: 25 mmol/L (ref 22–32)
pCO2, Ven: 35.1 mm[Hg] — ABNORMAL LOW (ref 44–60)
pH, Ven: 7.441 — ABNORMAL HIGH (ref 7.25–7.43)
pO2, Ven: 46 mm[Hg] — ABNORMAL HIGH (ref 32–45)

## 2023-12-07 LAB — POCT I-STAT 7, (LYTES, BLD GAS, ICA,H+H)
Acid-Base Excess: 0 mmol/L (ref 0.0–2.0)
Acid-base deficit: 2 mmol/L (ref 0.0–2.0)
Acid-base deficit: 1 mmol/L (ref 0.0–2.0)
Acid-base deficit: 3 mmol/L — ABNORMAL HIGH (ref 0.0–2.0)
Acid-base deficit: 5 mmol/L — ABNORMAL HIGH (ref 0.0–2.0)
Acid-base deficit: 2 mmol/L (ref 0.0–2.0)
Acid-base deficit: 2 mmol/L (ref 0.0–2.0)
Acid-base deficit: 3 mmol/L — ABNORMAL HIGH (ref 0.0–2.0)
Acid-base deficit: 2 mmol/L (ref 0.0–2.0)
Bicarbonate: 23.9 mmol/L (ref 20.0–28.0)
Bicarbonate: 22.4 mmol/L (ref 20.0–28.0)
Bicarbonate: 24.7 mmol/L (ref 20.0–28.0)
Bicarbonate: 22.9 mmol/L (ref 20.0–28.0)
Bicarbonate: 21.8 mmol/L (ref 20.0–28.0)
Bicarbonate: 24.7 mmol/L (ref 20.0–28.0)
Bicarbonate: 24.6 mmol/L (ref 20.0–28.0)
Bicarbonate: 23.8 mmol/L (ref 20.0–28.0)
Bicarbonate: 22.9 mmol/L (ref 20.0–28.0)
Calcium, Ion: 1.25 mmol/L (ref 1.15–1.40)
Calcium, Ion: 0.93 mmol/L — ABNORMAL LOW (ref 1.15–1.40)
Calcium, Ion: 1.04 mmol/L — ABNORMAL LOW (ref 1.15–1.40)
Calcium, Ion: 1.07 mmol/L — ABNORMAL LOW (ref 1.15–1.40)
Calcium, Ion: 1.15 mmol/L (ref 1.15–1.40)
Calcium, Ion: 1.14 mmol/L — ABNORMAL LOW (ref 1.15–1.40)
Calcium, Ion: 1.13 mmol/L — ABNORMAL LOW (ref 1.15–1.40)
Calcium, Ion: 1.15 mmol/L (ref 1.15–1.40)
Calcium, Ion: 1.17 mmol/L (ref 1.15–1.40)
HCT: 34 % — ABNORMAL LOW (ref 36.0–46.0)
HCT: 23 % — ABNORMAL LOW (ref 36.0–46.0)
HCT: 25 % — ABNORMAL LOW (ref 36.0–46.0)
HCT: 25 % — ABNORMAL LOW (ref 36.0–46.0)
HCT: 23 % — ABNORMAL LOW (ref 36.0–46.0)
HCT: 22 % — ABNORMAL LOW (ref 36.0–46.0)
HCT: 23 % — ABNORMAL LOW (ref 36.0–46.0)
HCT: 23 % — ABNORMAL LOW (ref 36.0–46.0)
HCT: 24 % — ABNORMAL LOW (ref 36.0–46.0)
Hemoglobin: 11.6 g/dL — ABNORMAL LOW (ref 12.0–15.0)
Hemoglobin: 7.8 g/dL — ABNORMAL LOW (ref 12.0–15.0)
Hemoglobin: 8.5 g/dL — ABNORMAL LOW (ref 12.0–15.0)
Hemoglobin: 8.5 g/dL — ABNORMAL LOW (ref 12.0–15.0)
Hemoglobin: 7.8 g/dL — ABNORMAL LOW (ref 12.0–15.0)
Hemoglobin: 7.5 g/dL — ABNORMAL LOW (ref 12.0–15.0)
Hemoglobin: 7.8 g/dL — ABNORMAL LOW (ref 12.0–15.0)
Hemoglobin: 7.8 g/dL — ABNORMAL LOW (ref 12.0–15.0)
Hemoglobin: 8.2 g/dL — ABNORMAL LOW (ref 12.0–15.0)
O2 Saturation: 100 %
O2 Saturation: 100 %
O2 Saturation: 100 %
O2 Saturation: 98 %
O2 Saturation: 99 %
O2 Saturation: 99 %
O2 Saturation: 99 %
O2 Saturation: 99 %
O2 Saturation: 99 %
Patient temperature: 35.2
Patient temperature: 36.9
Patient temperature: 37
Patient temperature: 37
Patient temperature: 37.1
Patient temperature: 37.5
Potassium: 3.3 mmol/L — ABNORMAL LOW (ref 3.5–5.1)
Potassium: 3.8 mmol/L (ref 3.5–5.1)
Potassium: 4.3 mmol/L (ref 3.5–5.1)
Potassium: 3.7 mmol/L (ref 3.5–5.1)
Potassium: 4.5 mmol/L (ref 3.5–5.1)
Potassium: 4.4 mmol/L (ref 3.5–5.1)
Potassium: 4.2 mmol/L (ref 3.5–5.1)
Potassium: 4.1 mmol/L (ref 3.5–5.1)
Potassium: 3.7 mmol/L (ref 3.5–5.1)
Sodium: 140 mmol/L (ref 135–145)
Sodium: 138 mmol/L (ref 135–145)
Sodium: 140 mmol/L (ref 135–145)
Sodium: 141 mmol/L (ref 135–145)
Sodium: 141 mmol/L (ref 135–145)
Sodium: 140 mmol/L (ref 135–145)
Sodium: 141 mmol/L (ref 135–145)
Sodium: 141 mmol/L (ref 135–145)
Sodium: 141 mmol/L (ref 135–145)
TCO2: 25 mmol/L (ref 22–32)
TCO2: 23 mmol/L (ref 22–32)
TCO2: 26 mmol/L (ref 22–32)
TCO2: 24 mmol/L (ref 22–32)
TCO2: 23 mmol/L (ref 22–32)
TCO2: 26 mmol/L (ref 22–32)
TCO2: 26 mmol/L (ref 22–32)
TCO2: 25 mmol/L (ref 22–32)
TCO2: 24 mmol/L (ref 22–32)
pCO2 arterial: 45.6 mm[Hg] (ref 32–48)
pCO2 arterial: 31.4 mm[Hg] — ABNORMAL LOW (ref 32–48)
pCO2 arterial: 41.7 mm[Hg] (ref 32–48)
pCO2 arterial: 40.5 mm[Hg] (ref 32–48)
pCO2 arterial: 48.1 mm[Hg] — ABNORMAL HIGH (ref 32–48)
pCO2 arterial: 49.8 mm[Hg] — ABNORMAL HIGH (ref 32–48)
pCO2 arterial: 51.5 mm[Hg] — ABNORMAL HIGH (ref 32–48)
pCO2 arterial: 49.2 mm[Hg] — ABNORMAL HIGH (ref 32–48)
pCO2 arterial: 39.6 mm[Hg] (ref 32–48)
pH, Arterial: 7.328 — ABNORMAL LOW (ref 7.35–7.45)
pH, Arterial: 7.461 — ABNORMAL HIGH (ref 7.35–7.45)
pH, Arterial: 7.381 (ref 7.35–7.45)
pH, Arterial: 7.351 (ref 7.35–7.45)
pH, Arterial: 7.264 — ABNORMAL LOW (ref 7.35–7.45)
pH, Arterial: 7.302 — ABNORMAL LOW (ref 7.35–7.45)
pH, Arterial: 7.288 — ABNORMAL LOW (ref 7.35–7.45)
pH, Arterial: 7.292 — ABNORMAL LOW (ref 7.35–7.45)
pH, Arterial: 7.372 (ref 7.35–7.45)
pO2, Arterial: 481 mm[Hg] — ABNORMAL HIGH (ref 83–108)
pO2, Arterial: 443 mm[Hg] — ABNORMAL HIGH (ref 83–108)
pO2, Arterial: 355 mm[Hg] — ABNORMAL HIGH (ref 83–108)
pO2, Arterial: 108 mm[Hg] (ref 83–108)
pO2, Arterial: 157 mm[Hg] — ABNORMAL HIGH (ref 83–108)
pO2, Arterial: 161 mm[Hg] — ABNORMAL HIGH (ref 83–108)
pO2, Arterial: 141 mm[Hg] — ABNORMAL HIGH (ref 83–108)
pO2, Arterial: 148 mm[Hg] — ABNORMAL HIGH (ref 83–108)
pO2, Arterial: 137 mm[Hg] — ABNORMAL HIGH (ref 83–108)

## 2023-12-07 LAB — GLUCOSE, CAPILLARY
Glucose-Capillary: 122 mg/dL — ABNORMAL HIGH (ref 70–99)
Glucose-Capillary: 125 mg/dL — ABNORMAL HIGH (ref 70–99)
Glucose-Capillary: 112 mg/dL — ABNORMAL HIGH (ref 70–99)
Glucose-Capillary: 100 mg/dL — ABNORMAL HIGH (ref 70–99)
Glucose-Capillary: 141 mg/dL — ABNORMAL HIGH (ref 70–99)
Glucose-Capillary: 161 mg/dL — ABNORMAL HIGH (ref 70–99)
Glucose-Capillary: 121 mg/dL — ABNORMAL HIGH (ref 70–99)

## 2023-12-07 LAB — ECHO INTRAOPERATIVE TEE
Height: 64.5 in
Weight: 2345.6 [oz_av]

## 2023-12-07 LAB — BASIC METABOLIC PANEL
Anion gap: 9 (ref 5–15)
BUN: 15 mg/dL (ref 8–23)
CO2: 22 mmol/L (ref 22–32)
Calcium: 7.6 mg/dL — ABNORMAL LOW (ref 8.9–10.3)
Chloride: 108 mmol/L (ref 98–111)
Creatinine, Ser: 0.58 mg/dL (ref 0.44–1.00)
GFR, Estimated: >60 mL/min (ref >60)
Glucose, Bld: 142 mg/dL — ABNORMAL HIGH (ref 70–99)
Potassium: 4.1 mmol/L (ref 3.5–5.1)
Sodium: 139 mmol/L (ref 135–145)

## 2023-12-07 LAB — POCT I-STAT, CHEM 8
BUN: 22 mg/dL (ref 8–23)
BUN: 20 mg/dL (ref 8–23)
BUN: 20 mg/dL (ref 8–23)
BUN: 22 mg/dL (ref 8–23)
Calcium, Ion: 1.25 mmol/L (ref 1.15–1.40)
Calcium, Ion: 1.03 mmol/L — ABNORMAL LOW (ref 1.15–1.40)
Calcium, Ion: 1.2 mmol/L (ref 1.15–1.40)
Calcium, Ion: 1.04 mmol/L — ABNORMAL LOW (ref 1.15–1.40)
Chloride: 105 mmol/L (ref 98–111)
Chloride: 104 mmol/L (ref 98–111)
Chloride: 105 mmol/L (ref 98–111)
Chloride: 105 mmol/L (ref 98–111)
Creatinine, Ser: 0.5 mg/dL (ref 0.44–1.00)
Creatinine, Ser: 0.4 mg/dL — ABNORMAL LOW (ref 0.44–1.00)
Creatinine, Ser: 0.5 mg/dL (ref 0.44–1.00)
Creatinine, Ser: 0.4 mg/dL — ABNORMAL LOW (ref 0.44–1.00)
Glucose, Bld: 127 mg/dL — ABNORMAL HIGH (ref 70–99)
Glucose, Bld: 102 mg/dL — ABNORMAL HIGH (ref 70–99)
Glucose, Bld: 110 mg/dL — ABNORMAL HIGH (ref 70–99)
Glucose, Bld: 124 mg/dL — ABNORMAL HIGH (ref 70–99)
HCT: 33 % — ABNORMAL LOW (ref 36.0–46.0)
HCT: 22 % — ABNORMAL LOW (ref 36.0–46.0)
HCT: 31 % — ABNORMAL LOW (ref 36.0–46.0)
HCT: 23 % — ABNORMAL LOW (ref 36.0–46.0)
Hemoglobin: 11.2 g/dL — ABNORMAL LOW (ref 12.0–15.0)
Hemoglobin: 10.5 g/dL — ABNORMAL LOW (ref 12.0–15.0)
Hemoglobin: 7.5 g/dL — ABNORMAL LOW (ref 12.0–15.0)
Hemoglobin: 7.8 g/dL — ABNORMAL LOW (ref 12.0–15.0)
Potassium: 3.3 mmol/L — ABNORMAL LOW (ref 3.5–5.1)
Potassium: 3.4 mmol/L — ABNORMAL LOW (ref 3.5–5.1)
Potassium: 4.2 mmol/L (ref 3.5–5.1)
Potassium: 4.4 mmol/L (ref 3.5–5.1)
Sodium: 141 mmol/L (ref 135–145)
Sodium: 138 mmol/L (ref 135–145)
Sodium: 139 mmol/L (ref 135–145)
Sodium: 139 mmol/L (ref 135–145)
TCO2: 26 mmol/L (ref 22–32)
TCO2: 24 mmol/L (ref 22–32)
TCO2: 28 mmol/L (ref 22–32)
TCO2: 26 mmol/L (ref 22–32)

## 2023-12-07 LAB — PROTIME-INR
INR: 1.5 — ABNORMAL HIGH (ref 0.8–1.2)
Prothrombin Time: 18.6 s — ABNORMAL HIGH (ref 11.4–15.2)

## 2023-12-07 LAB — PLATELET COUNT: Platelets: 130 10*3/uL — ABNORMAL LOW (ref 150–400)

## 2023-12-07 LAB — ABO/RH: ABO/RH(D): A POS

## 2023-12-07 LAB — MAGNESIUM: Magnesium: 3.2 mg/dL — ABNORMAL HIGH (ref 1.7–2.4)

## 2023-12-07 LAB — HEMOGLOBIN AND HEMATOCRIT, BLOOD
HCT: 23.5 % — ABNORMAL LOW (ref 36.0–46.0)
Hemoglobin: 8 g/dL — ABNORMAL LOW (ref 12.0–15.0)

## 2023-12-07 LAB — APTT: aPTT: 35 s (ref 24–36)

## 2023-12-07 SURGERY — REPAIR, MITRAL VALVE
Anesthesia: General | Site: Chest

## 2023-12-07 MED ORDER — ALBUMIN HUMAN 5 % IV SOLN
250.0000 mL | INTRAVENOUS | Status: DC | PRN
  Administered 2023-12-07 (×3): 12.5 g via INTRAVENOUS
  Filled 2023-12-07 (×2): qty 250

## 2023-12-07 MED ORDER — FENTANYL CITRATE (PF) 250 MCG/5ML IJ SOLN
INTRAMUSCULAR | Status: DC | PRN
  Administered 2023-12-07 (×2): 100 ug via INTRAVENOUS
  Administered 2023-12-07: 50 ug via INTRAVENOUS
  Administered 2023-12-07: 100 ug via INTRAVENOUS

## 2023-12-07 MED ORDER — MAGNESIUM SULFATE 4 GM/100ML IV SOLN
4.0000 g | Freq: Once | INTRAVENOUS | Status: AC
  Administered 2023-12-07: 4 g via INTRAVENOUS
  Filled 2023-12-07: qty 100

## 2023-12-07 MED ORDER — INSULIN ASPART 100 UNIT/ML IJ SOLN
0.0000 [IU] | INTRAMUSCULAR | Status: DC
  Administered 2023-12-07: 2 [IU] via SUBCUTANEOUS
  Administered 2023-12-07: 4 [IU] via SUBCUTANEOUS
  Administered 2023-12-07 – 2023-12-09 (×6): 2 [IU] via SUBCUTANEOUS

## 2023-12-07 MED ORDER — METOPROLOL TARTRATE 5 MG/5ML IV SOLN: 2.5000 mg | INTRAVENOUS | Status: DC | PRN

## 2023-12-07 MED ORDER — ACETAMINOPHEN 500 MG PO TABS
1000.0000 mg | ORAL_TABLET | Freq: Four times a day (QID) | ORAL | Status: AC
  Administered 2023-12-07 – 2023-12-12 (×18): 1000 mg via ORAL
  Filled 2023-12-07 (×19): qty 2

## 2023-12-07 MED ORDER — PANTOPRAZOLE SODIUM 40 MG IV SOLR
40.0000 mg | Freq: Every day | INTRAVENOUS | Status: AC
  Administered 2023-12-07 – 2023-12-08 (×2): 40 mg via INTRAVENOUS
  Filled 2023-12-07 (×2): qty 10

## 2023-12-07 MED ORDER — FLUTICASONE PROPIONATE 50 MCG/ACT NA SUSP
1.0000 | Freq: Every day | NASAL | Status: DC | PRN
  Filled 2023-12-07: qty 16

## 2023-12-07 MED ORDER — ALBUMIN HUMAN 5 % IV SOLN: INTRAVENOUS | Status: DC | PRN

## 2023-12-07 MED ORDER — DEXAMETHASONE SODIUM PHOSPHATE 10 MG/ML IJ SOLN
INTRAMUSCULAR | Status: AC
  Filled 2023-12-07: qty 1

## 2023-12-07 MED ORDER — BISACODYL 5 MG PO TBEC
10.0000 mg | DELAYED_RELEASE_TABLET | Freq: Every day | ORAL | Status: DC
Start: 2023-12-08 — End: 2023-12-11
  Administered 2023-12-08: 10 mg via ORAL
  Filled 2023-12-07 (×3): qty 2

## 2023-12-07 MED ORDER — PROPOFOL 1000 MG/100ML IV EMUL
INTRAVENOUS | Status: AC
  Filled 2023-12-07: qty 100

## 2023-12-07 MED ORDER — ORAL CARE MOUTH RINSE: 15.0000 mL | Freq: Once | OROMUCOSAL | Status: AC

## 2023-12-07 MED ORDER — CHLORHEXIDINE GLUCONATE CLOTH 2 % EX PADS: 6.0000 | MEDICATED_PAD | Freq: Every day | CUTANEOUS | Status: DC

## 2023-12-07 MED ORDER — LACTATED RINGERS IV SOLN: INTRAVENOUS | Status: DC

## 2023-12-07 MED ORDER — PROPOFOL 10 MG/ML IV BOLUS
INTRAVENOUS | Status: AC
  Filled 2023-12-07: qty 20

## 2023-12-07 MED ORDER — ESCITALOPRAM OXALATE 20 MG PO TABS
20.0000 mg | ORAL_TABLET | Freq: Every day | ORAL | Status: DC
  Administered 2023-12-08 – 2023-12-13 (×6): 20 mg via ORAL
  Filled 2023-12-07: qty 1
  Filled 2023-12-07: qty 2
  Filled 2023-12-07: qty 1
  Filled 2023-12-07: qty 2
  Filled 2023-12-07 (×4): qty 1

## 2023-12-07 MED ORDER — PHENYLEPHRINE HCL-NACL 20-0.9 MG/250ML-% IV SOLN
0.0000 ug/min | INTRAVENOUS | Status: DC
Start: 2023-12-07 — End: 2023-12-08
  Administered 2023-12-08: 40 ug/min via INTRAVENOUS
  Filled 2023-12-07: qty 250

## 2023-12-07 MED ORDER — METOPROLOL TARTRATE 12.5 MG HALF TABLET
12.5000 mg | ORAL_TABLET | Freq: Two times a day (BID) | ORAL | Status: DC
Start: 2023-12-07 — End: 2023-12-08

## 2023-12-07 MED ORDER — TRAMADOL HCL 50 MG PO TABS
50.0000 mg | ORAL_TABLET | ORAL | Status: DC | PRN
  Administered 2023-12-07 – 2023-12-09 (×3): 50 mg via ORAL
  Filled 2023-12-07 (×3): qty 1

## 2023-12-07 MED ORDER — PHENYLEPHRINE HCL-NACL 20-0.9 MG/250ML-% IV SOLN
INTRAVENOUS | Status: AC
  Filled 2023-12-07: qty 250

## 2023-12-07 MED ORDER — CEFAZOLIN SODIUM-DEXTROSE 2-4 GM/100ML-% IV SOLN
2.0000 g | Freq: Three times a day (TID) | INTRAVENOUS | Status: AC
  Administered 2023-12-07 – 2023-12-09 (×6): 2 g via INTRAVENOUS
  Filled 2023-12-07 (×6): qty 100

## 2023-12-07 MED ORDER — DOCUSATE SODIUM 100 MG PO CAPS
200.0000 mg | ORAL_CAPSULE | Freq: Every day | ORAL | Status: DC
  Administered 2023-12-08 – 2023-12-09 (×2): 200 mg via ORAL
  Filled 2023-12-07 (×3): qty 2

## 2023-12-07 MED ORDER — CHLORHEXIDINE GLUCONATE 4 % EX SOLN
30.0000 mL | CUTANEOUS | Status: DC
Start: 2023-12-07 — End: 2023-12-07

## 2023-12-07 MED ORDER — ACETAMINOPHEN 160 MG/5ML PO SOLN
1000.0000 mg | Freq: Four times a day (QID) | ORAL | Status: DC
  Filled 2023-12-07: qty 40.6

## 2023-12-07 MED ORDER — METOCLOPRAMIDE HCL 5 MG/ML IJ SOLN
10.0000 mg | Freq: Four times a day (QID) | INTRAMUSCULAR | Status: AC
  Administered 2023-12-07 – 2023-12-08 (×6): 10 mg via INTRAVENOUS
  Filled 2023-12-07 (×6): qty 2

## 2023-12-07 MED ORDER — METHADONE HCL IV SYRINGE 10 MG/ML FOR CABG
0.2000 mg/kg | Freq: Once | INTRAMUSCULAR | Status: AC
  Administered 2023-12-07: 11 mg via INTRAVENOUS
  Filled 2023-12-07: qty 1.1

## 2023-12-07 MED ORDER — AMIODARONE HCL 200 MG PO TABS
400.0000 mg | ORAL_TABLET | Freq: Two times a day (BID) | ORAL | Status: DC
  Administered 2023-12-08: 400 mg via ORAL
  Filled 2023-12-07: qty 2

## 2023-12-07 MED ORDER — PANTOPRAZOLE SODIUM 40 MG PO TBEC
40.0000 mg | DELAYED_RELEASE_TABLET | Freq: Every day | ORAL | Status: DC
  Administered 2023-12-09 – 2023-12-13 (×5): 40 mg via ORAL
  Filled 2023-12-07 (×5): qty 1

## 2023-12-07 MED ORDER — ONDANSETRON HCL 4 MG/2ML IJ SOLN
INTRAMUSCULAR | Status: DC | PRN
  Administered 2023-12-07: 8 mg via INTRAVENOUS

## 2023-12-07 MED ORDER — PLASMA-LYTE A IV SOLN: INTRAVENOUS | Status: DC | PRN

## 2023-12-07 MED ORDER — MIDAZOLAM HCL 2 MG/2ML IJ SOLN
2.0000 mg | INTRAMUSCULAR | Status: DC | PRN
  Administered 2023-12-07: 2 mg via INTRAVENOUS

## 2023-12-07 MED ORDER — FENTANYL CITRATE (PF) 250 MCG/5ML IJ SOLN
INTRAMUSCULAR | Status: AC
  Filled 2023-12-07: qty 5

## 2023-12-07 MED ORDER — 0.9 % SODIUM CHLORIDE (POUR BTL) OPTIME
TOPICAL | Status: DC | PRN
  Administered 2023-12-07: 5000 mL

## 2023-12-07 MED ORDER — SODIUM CHLORIDE 0.9 % IV SOLN: INTRAVENOUS | Status: AC

## 2023-12-07 MED ORDER — NITROGLYCERIN IN D5W 200-5 MCG/ML-% IV SOLN: 0.0000 ug/min | INTRAVENOUS | Status: DC

## 2023-12-07 MED ORDER — DEXTROSE 50 % IV SOLN: 0.0000 mL | INTRAVENOUS | Status: DC | PRN

## 2023-12-07 MED ORDER — CHLORHEXIDINE GLUCONATE 0.12 % MT SOLN: 15.0000 mL | Freq: Once | OROMUCOSAL | Status: DC

## 2023-12-07 MED ORDER — SODIUM CHLORIDE 0.9 % IV SOLN
250.0000 mL | INTRAVENOUS | Status: AC
Start: 2023-12-08 — End: 2023-12-09

## 2023-12-07 MED ORDER — CHLORHEXIDINE GLUCONATE 0.12 % MT SOLN
15.0000 mL | Freq: Once | OROMUCOSAL | Status: AC
  Administered 2023-12-07: 15 mL via OROMUCOSAL
  Filled 2023-12-07: qty 15

## 2023-12-07 MED ORDER — MIDAZOLAM HCL (PF) 5 MG/ML IJ SOLN
INTRAMUSCULAR | Status: DC | PRN
  Administered 2023-12-07: 2 mg via INTRAVENOUS
  Administered 2023-12-07: 1 mg via INTRAVENOUS

## 2023-12-07 MED ORDER — ~~LOC~~ CARDIAC SURGERY, PATIENT & FAMILY EDUCATION
Freq: Once | Status: DC
Start: 2023-12-07 — End: 2023-12-07
  Filled 2023-12-07: qty 1

## 2023-12-07 MED ORDER — NITROGLYCERIN IN D5W 200-5 MCG/ML-% IV SOLN
INTRAVENOUS | Status: AC
  Filled 2023-12-07: qty 250

## 2023-12-07 MED ORDER — SODIUM CHLORIDE 0.9 % IV SOLN: INTRAVENOUS | Status: DC | PRN

## 2023-12-07 MED ORDER — MIDAZOLAM HCL 2 MG/2ML IJ SOLN
INTRAMUSCULAR | Status: AC
  Filled 2023-12-07: qty 2

## 2023-12-07 MED ORDER — ROCURONIUM BROMIDE 10 MG/ML (PF) SYRINGE
PREFILLED_SYRINGE | INTRAVENOUS | Status: DC | PRN
  Administered 2023-12-07: 100 mg via INTRAVENOUS
  Administered 2023-12-07: 50 mg via INTRAVENOUS

## 2023-12-07 MED ORDER — EZETIMIBE 10 MG PO TABS
10.0000 mg | ORAL_TABLET | Freq: Every day | ORAL | Status: DC
  Administered 2023-12-08 – 2023-12-13 (×6): 10 mg via ORAL
  Filled 2023-12-07 (×6): qty 1

## 2023-12-07 MED ORDER — ARTIFICIAL TEARS OPHTHALMIC OINT
TOPICAL_OINTMENT | OPHTHALMIC | Status: DC | PRN
Start: 2023-12-07 — End: 2023-12-07
  Administered 2023-12-07: 1 via OPHTHALMIC

## 2023-12-07 MED ORDER — ASPIRIN 81 MG PO CHEW
324.0000 mg | CHEWABLE_TABLET | Freq: Every day | ORAL | Status: DC
  Filled 2023-12-07: qty 4

## 2023-12-07 MED ORDER — ASPIRIN 325 MG PO TBEC
325.0000 mg | DELAYED_RELEASE_TABLET | Freq: Every day | ORAL | Status: DC
  Administered 2023-12-08 – 2023-12-12 (×5): 325 mg via ORAL
  Filled 2023-12-07 (×5): qty 1

## 2023-12-07 MED ORDER — ACETAMINOPHEN 160 MG/5ML PO SOLN
650.0000 mg | Freq: Once | ORAL | Status: AC
  Administered 2023-12-07: 650 mg
  Filled 2023-12-07: qty 20.3

## 2023-12-07 MED ORDER — ROCURONIUM BROMIDE 10 MG/ML (PF) SYRINGE
PREFILLED_SYRINGE | INTRAVENOUS | Status: AC
  Filled 2023-12-07: qty 10

## 2023-12-07 MED ORDER — SODIUM CHLORIDE 0.9% FLUSH: 3.0000 mL | INTRAVENOUS | Status: DC | PRN

## 2023-12-07 MED ORDER — METOPROLOL TARTRATE 25 MG/10 ML ORAL SUSPENSION
12.5000 mg | Freq: Two times a day (BID) | ORAL | Status: DC
  Filled 2023-12-07: qty 5

## 2023-12-07 MED ORDER — SODIUM BICARBONATE 8.4 % IV SOLN
50.0000 meq | Freq: Once | INTRAVENOUS | Status: AC
  Administered 2023-12-07: 50 meq via INTRAVENOUS

## 2023-12-07 MED ORDER — SODIUM CHLORIDE 0.9% FLUSH
3.0000 mL | Freq: Two times a day (BID) | INTRAVENOUS | Status: DC
  Administered 2023-12-08 – 2023-12-13 (×11): 3 mL via INTRAVENOUS

## 2023-12-07 MED ORDER — MIDAZOLAM HCL 2 MG/2ML IJ SOLN
INTRAMUSCULAR | Status: AC
Start: 2023-12-07 — End: ?
  Filled 2023-12-07: qty 2

## 2023-12-07 MED ORDER — BISACODYL 10 MG RE SUPP
10.0000 mg | Freq: Every day | RECTAL | Status: DC
  Filled 2023-12-07: qty 1

## 2023-12-07 MED ORDER — PROPOFOL 10 MG/ML IV BOLUS
INTRAVENOUS | Status: DC | PRN
  Administered 2023-12-07: 50 mg via INTRAVENOUS

## 2023-12-07 MED ORDER — CHLORHEXIDINE GLUCONATE 0.12 % MT SOLN
15.0000 mL | OROMUCOSAL | Status: AC
  Administered 2023-12-07: 15 mL via OROMUCOSAL
  Filled 2023-12-07: qty 15

## 2023-12-07 MED ORDER — LIDOCAINE HCL (CARDIAC) PF 100 MG/5ML IV SOSY
PREFILLED_SYRINGE | INTRAVENOUS | Status: DC | PRN
Start: 2023-12-07 — End: 2023-12-07
  Administered 2023-12-07: 100 mg via INTRATRACHEAL

## 2023-12-07 MED ORDER — LACTATED RINGERS IV SOLN: INTRAVENOUS | Status: AC

## 2023-12-07 MED ORDER — ROCURONIUM BROMIDE 10 MG/ML (PF) SYRINGE: PREFILLED_SYRINGE | INTRAVENOUS | Status: DC | PRN

## 2023-12-07 MED ORDER — HEPARIN SODIUM (PORCINE) 1000 UNIT/ML IJ SOLN
INTRAMUSCULAR | Status: DC | PRN
  Administered 2023-12-07: 27000 [IU] via INTRAVENOUS

## 2023-12-07 MED ORDER — ASPIRIN 81 MG PO CHEW
324.0000 mg | CHEWABLE_TABLET | Freq: Once | ORAL | Status: AC
  Administered 2023-12-07: 324 mg via ORAL
  Filled 2023-12-07: qty 4

## 2023-12-07 MED ORDER — POTASSIUM CHLORIDE 10 MEQ/50ML IV SOLN
10.0000 meq | INTRAVENOUS | Status: AC
  Administered 2023-12-07 (×3): 10 meq via INTRAVENOUS

## 2023-12-07 MED ORDER — PROTAMINE SULFATE 10 MG/ML IV SOLN: INTRAVENOUS | Status: DC | PRN

## 2023-12-07 MED ORDER — ONDANSETRON HCL 4 MG/2ML IJ SOLN: 4.0000 mg | Freq: Four times a day (QID) | INTRAMUSCULAR | Status: DC | PRN

## 2023-12-07 MED ORDER — MORPHINE SULFATE (PF) 2 MG/ML IV SOLN
1.0000 mg | INTRAVENOUS | Status: DC | PRN
  Administered 2023-12-07 (×2): 2 mg via INTRAVENOUS
  Filled 2023-12-07 (×2): qty 1

## 2023-12-07 MED ORDER — ONDANSETRON HCL 4 MG/2ML IJ SOLN
INTRAMUSCULAR | Status: AC
  Filled 2023-12-07: qty 2

## 2023-12-07 MED ORDER — INSULIN REGULAR(HUMAN) IN NACL 100-0.9 UT/100ML-% IV SOLN
INTRAVENOUS | Status: DC
  Filled 2023-12-07: qty 100

## 2023-12-07 MED ORDER — SODIUM CHLORIDE 0.45 % IV SOLN: INTRAVENOUS | Status: AC | PRN

## 2023-12-07 MED ORDER — PROTAMINE SULFATE 10 MG/ML IV SOLN
INTRAVENOUS | Status: AC
  Filled 2023-12-07: qty 50

## 2023-12-07 MED ORDER — ALPRAZOLAM 0.5 MG PO TABS
0.5000 mg | ORAL_TABLET | Freq: Every evening | ORAL | Status: DC | PRN
  Administered 2023-12-08 – 2023-12-12 (×4): 0.5 mg via ORAL
  Filled 2023-12-07 (×5): qty 1

## 2023-12-07 MED ORDER — PHENYLEPHRINE 80 MCG/ML (10ML) SYRINGE FOR IV PUSH (FOR BLOOD PRESSURE SUPPORT)
PREFILLED_SYRINGE | INTRAVENOUS | Status: AC
  Filled 2023-12-07: qty 10

## 2023-12-07 MED ORDER — LIDOCAINE 2% (20 MG/ML) 5 ML SYRINGE
INTRAMUSCULAR | Status: AC
  Filled 2023-12-07: qty 5

## 2023-12-07 MED ORDER — THYROID 60 MG PO TABS
120.0000 mg | ORAL_TABLET | Freq: Every day | ORAL | Status: DC
  Administered 2023-12-08 – 2023-12-13 (×6): 120 mg via ORAL
  Filled 2023-12-07 (×6): qty 2

## 2023-12-07 MED ORDER — VANCOMYCIN HCL 1000 MG IV SOLR: INTRAVENOUS | Status: DC | PRN

## 2023-12-07 MED ORDER — METOPROLOL TARTRATE 12.5 MG HALF TABLET
12.5000 mg | ORAL_TABLET | Freq: Once | ORAL | Status: DC
  Filled 2023-12-07: qty 1

## 2023-12-07 MED ORDER — OXYCODONE HCL 5 MG PO TABS
5.0000 mg | ORAL_TABLET | ORAL | Status: DC | PRN
  Administered 2023-12-08 – 2023-12-13 (×7): 5 mg via ORAL
  Filled 2023-12-07 (×8): qty 1

## 2023-12-07 MED ORDER — DEXMEDETOMIDINE HCL IN NACL 400 MCG/100ML IV SOLN
0.0000 ug/kg/h | INTRAVENOUS | Status: DC
  Filled 2023-12-07: qty 100

## 2023-12-07 MED ORDER — PHENYLEPHRINE 80 MCG/ML (10ML) SYRINGE FOR IV PUSH (FOR BLOOD PRESSURE SUPPORT)
PREFILLED_SYRINGE | INTRAVENOUS | Status: DC | PRN
  Administered 2023-12-07 (×2): 160 ug via INTRAVENOUS
  Administered 2023-12-07 (×2): 80 ug via INTRAVENOUS

## 2023-12-07 MED ORDER — PROPOFOL 1000 MG/100ML IV EMUL
INTRAVENOUS | Status: AC
  Filled 2023-12-07: qty 200

## 2023-12-07 MED ORDER — VANCOMYCIN HCL IN DEXTROSE 1-5 GM/200ML-% IV SOLN
1000.0000 mg | Freq: Once | INTRAVENOUS | Status: AC
  Administered 2023-12-07: 1000 mg via INTRAVENOUS
  Filled 2023-12-07: qty 200

## 2023-12-07 MED ORDER — PROTAMINE SULFATE 10 MG/ML IV SOLN
INTRAVENOUS | Status: DC | PRN
  Administered 2023-12-07 (×5): 50 mg via INTRAVENOUS
  Administered 2023-12-07: 70 mg via INTRAVENOUS

## 2023-12-07 SURGICAL SUPPLY — 81 items
ADAPTER CARDIO PERF ANTE/RETRO (ADAPTER) IMPLANT
ANTIFOG SOL W/FOAM PAD STRL (MISCELLANEOUS) ×2 IMPLANT
BAG DECANTER FOR FLEXI CONT (MISCELLANEOUS) ×2 IMPLANT
BLADE CLIPPER SURG (BLADE) ×2 IMPLANT
BLADE STERNUM SYSTEM 6 (BLADE) ×2 IMPLANT
CANISTER SUCT 3000ML PPV (MISCELLANEOUS) ×2 IMPLANT
CANN PRFSN 3/8XCNCT ST RT ANG (MISCELLANEOUS) ×2 IMPLANT
CANNULA MC2 2 STG 36/46 CONN (CANNULA) IMPLANT
CANNULA NON VENT 20FR 12 (CANNULA) ×2 IMPLANT
CANNULA NON VENT 22FR 12 (CANNULA) ×2 IMPLANT
CANNULA PRFSN 3/8XCNCT RT ANG (MISCELLANEOUS) IMPLANT
CANNULA SUMP PERICARDIAL (CANNULA) ×2 IMPLANT
CANNULA VRC MALB SNGL STG 36FR (MISCELLANEOUS) IMPLANT
CATH HEART VENT LEFT (CATHETERS) ×2 IMPLANT
CATH RETROPLEGIA CORONARY 14FR (CATHETERS) IMPLANT
CATH ROBINSON RED A/P 18FR (CATHETERS) ×6 IMPLANT
CATH THOR STR 32F SOFT 20 RADI (CATHETERS) ×2 IMPLANT
CATH THORACIC 28FR RT ANG (CATHETERS) ×2 IMPLANT
CLAMP SUTURE YELLOW 5 PAIRS (MISCELLANEOUS) ×2 IMPLANT
CLIP TI MEDIUM 24 (CLIP) IMPLANT
CLIP TI WIDE RED SMALL 24 (CLIP) IMPLANT
CONNECTOR 1/2X3/8X1/2 3WAY (MISCELLANEOUS) IMPLANT
DEV SUMP PERICARDIAL 20F 15IN (MISCELLANEOUS) IMPLANT
DEVICE SUT CK QUICK LOAD INDV (Prosthesis & Implant Heart) IMPLANT
DRAPE CV SPLIT W-CLR ANES SCRN (DRAPES) ×2 IMPLANT
DRAPE INCISE IOBAN 66X45 STRL (DRAPES) ×2 IMPLANT
DRAPE PERI GROIN 82X75IN TIB (DRAPES) ×2 IMPLANT
DRSG AQUACEL AG ADV 3.5X10 (GAUZE/BANDAGES/DRESSINGS) ×2 IMPLANT
ELECT CAUTERY BLADE 6.4 (BLADE) ×2 IMPLANT
ELECT REM PT RETURN 9FT ADLT (ELECTROSURGICAL) ×4 IMPLANT
ELECTRODE REM PT RTRN 9FT ADLT (ELECTROSURGICAL) ×4 IMPLANT
FELT TEFLON 1X6 (MISCELLANEOUS) ×2 IMPLANT
GAUZE SPONGE 4X4 12PLY STRL (GAUZE/BANDAGES/DRESSINGS) ×2 IMPLANT
GAUZE SPONGE 4X4 12PLY STRL LF (GAUZE/BANDAGES/DRESSINGS) IMPLANT
GLOVE SS BIOGEL STRL SZ 6 (GLOVE) IMPLANT
GOWN STRL REUS W/ TWL LRG LVL3 (GOWN DISPOSABLE) ×12 IMPLANT
HEMOSTAT SURGICEL 2X14 (HEMOSTASIS) IMPLANT
INSERT FOGARTY XLG (MISCELLANEOUS) ×2 IMPLANT
IRRIG SUCT STRYKERFLOW 2 WTIP (MISCELLANEOUS) ×2 IMPLANT
IRRIGATION SUCT STRKRFLW 2 WTP (MISCELLANEOUS) IMPLANT
KIT BASIN OR (CUSTOM PROCEDURE TRAY) ×2 IMPLANT
KIT SUCTION CATH 14FR (SUCTIONS) IMPLANT
KIT SUT CK MINI COMBO 4X17 (Prosthesis & Implant Heart) IMPLANT
KIT TURNOVER KIT B (KITS) ×2 IMPLANT
LINE VENT (MISCELLANEOUS) IMPLANT
NS IRRIG 1000ML POUR BTL (IV SOLUTION) ×12 IMPLANT
ORGANIZER SUTURE GABBAY-FRATER (MISCELLANEOUS) ×2 IMPLANT
PACK E OPEN HEART (SUTURE) ×2 IMPLANT
PACK OPEN HEART (CUSTOM PROCEDURE TRAY) ×2 IMPLANT
PAD ARMBOARD 7.5X6 YLW CONV (MISCELLANEOUS) ×4 IMPLANT
PAD ELECT DEFIB RADIOL ZOLL (MISCELLANEOUS) ×2 IMPLANT
PENCIL BUTTON HOLSTER BLD 10FT (ELECTRODE) ×2 IMPLANT
POSITIONER HEAD DONUT 9IN (MISCELLANEOUS) ×2 IMPLANT
RING ANNULOPLASTY SIMULUS 34 (Prosthesis & Implant Heart) IMPLANT
SET MPS 3-ND DEL (MISCELLANEOUS) IMPLANT
SOLUTION ANTFG W/FOAM PAD STRL (MISCELLANEOUS) ×2 IMPLANT
SUT ETHIBOND 3 0 SH 1 (SUTURE) IMPLANT
SUT GORETEX CV 4 TH 22 36 (SUTURE) IMPLANT
SUT GORETEX CV-5 PH-17 (SUTURE) IMPLANT
SUT MNCRL AB 4-0 PS2 18 (SUTURE) ×4 IMPLANT
SUT PROLENE 4 0 SH DA (SUTURE) ×6 IMPLANT
SUT PROLENE 4-0 RB1 .5 CRCL 36 (SUTURE) ×4 IMPLANT
SUT PROLENE 5 0 RB 2 (SUTURE) IMPLANT
SUT PROLENE 6 0 C 1 30 (SUTURE) IMPLANT
SUT SILK 4 0 TIE 10X30 (SUTURE) IMPLANT
SUT STEEL 6MS V (SUTURE) ×2 IMPLANT
SUT STEEL SZ 6 DBL 3X14 BALL (SUTURE) ×4 IMPLANT
SUT VIC AB 0 CTX36XBRD ANTBCTR (SUTURE) ×4 IMPLANT
SUT VIC AB 2-0 CT1 TAPERPNT 27 (SUTURE) ×4 IMPLANT
SYSTEM SAHARA CHEST DRAIN ATS (WOUND CARE) ×2 IMPLANT
TAG SUTURE CLAMP YLW 5PR (MISCELLANEOUS) ×2 IMPLANT
TAPE CLOTH SURG 4X10 WHT LF (GAUZE/BANDAGES/DRESSINGS) IMPLANT
TAPE PAPER 2X10 WHT MICROPORE (GAUZE/BANDAGES/DRESSINGS) IMPLANT
TOWEL GREEN STERILE (TOWEL DISPOSABLE) ×2 IMPLANT
TOWEL GREEN STERILE FF (TOWEL DISPOSABLE) ×2 IMPLANT
TRAY FOLEY SLVR 16FR TEMP STAT (SET/KITS/TRAYS/PACK) ×2 IMPLANT
TUBE CONNECTING 12X1/4 (SUCTIONS) IMPLANT
UNDERPAD 30X36 HEAVY ABSORB (UNDERPADS AND DIAPERS) ×2 IMPLANT
VENT LEFT HEART 12002 (CATHETERS) ×2 IMPLANT
VRC MALLEABLE SINGLE STG 36FR (MISCELLANEOUS) ×2 IMPLANT
WATER STERILE IRR 1000ML POUR (IV SOLUTION) ×4 IMPLANT

## 2023-12-07 NOTE — Transfer of Care (Addendum)
Immediate Anesthesia Transfer of Care Note  Patient: Patricia Alvarado  Procedure(s) Performed: MITRAL VALVE REPAIR USING SIMULUS SEMI-RIGID ANNULOPLASTY BAND SIZE (Chest) TRANSESOPHAGEAL ECHOCARDIOGRAM (TEE)  Patient Location: SICU  Anesthesia Type:General  Level of Consciousness: Patient remains intubated per anesthesia plan  Airway & Oxygen Therapy: patient remains intubated per anesthesia plan   Post-op Assessment: Report given to RN and Post -op Vital signs reviewed and stable  Post vital signs: Reviewed and stable  Last Vitals:  Vitals Value Taken Time  BP 110/74    Temp 98   Pulse 74   Resp 16 12/07/23 1100  SpO2 100     Last Pain: There were no vitals filed for this visit.       Complications: No notable events documented.

## 2023-12-07 NOTE — Anesthesia Postprocedure Evaluation (Signed)
Anesthesia Post Note  Patient: Patricia Alvarado  Procedure(s) Performed: MITRAL VALVE REPAIR USING SIMULUS SEMI-RIGID ANNULOPLASTY BAND SIZE (Chest) TRANSESOPHAGEAL ECHOCARDIOGRAM (TEE)     Patient location during evaluation: SICU Anesthesia Type: General Level of consciousness: sedated Pain management: pain level controlled Vital Signs Assessment: post-procedure vital signs reviewed and stable Respiratory status: patient remains intubated per anesthesia plan Cardiovascular status: stable Postop Assessment: no apparent nausea or vomiting Anesthetic complications: no   No notable events documented.  Last Vitals:  Vitals:   12/07/23 0600 12/07/23 1100  BP: (!) 151/59   Pulse: 69   Resp: 18 16  Temp: (!) 36.4 C   SpO2: 96%     Last Pain: There were no vitals filed for this visit.               [place]

## 2023-12-07 NOTE — Telephone Encounter (Signed)
Paperwork completed and given back.

## 2023-12-07 NOTE — Anesthesia Procedure Notes (Signed)
Central Venous Catheter Insertion Performed by: Leary Nation, MD, anesthesiologist Start/End2/08/2024 7:10 AM, 12/07/2023 7:25 AM Patient location: Pre-op. Preanesthetic checklist: patient identified, IV checked, site marked, risks and benefits discussed, surgical consent, monitors and equipment checked, pre-op evaluation, timeout performed and anesthesia consent Position: Trendelenburg Lidocaine 1% used for infiltration and patient sedated Hand hygiene performed  and maximum sterile barriers used  Catheter size: 8.5 Fr PA cath and Central line was placed.MAC introducer Swan type:thermodilution Procedure performed using ultrasound guided technique. Ultrasound Notes:anatomy identified, needle tip was noted to be adjacent to the nerve/plexus identified, no ultrasound evidence of intravascular and/or intraneural injection and image(s) printed for medical record Attempts: 1 Following insertion, line sutured and dressing applied. Post procedure assessment: blood return through all ports, free fluid flow and no air  Patient tolerated the procedure well with no immediate complications.

## 2023-12-07 NOTE — Anesthesia Procedure Notes (Addendum)
Procedure Name: Intubation Date/Time: 12/07/2023 7:50 AM  Performed by: Benay Spice, RNPre-anesthesia Checklist: Patient identified, Emergency Drugs available, Suction available and Patient being monitored Patient Re-evaluated:Patient Re-evaluated prior to induction Oxygen Delivery Method: Circle system utilized Preoxygenation: Pre-oxygenation with 100% oxygen Induction Type: IV induction Ventilation: Mask ventilation without difficulty Laryngoscope Size: Mac and 3 Grade View: Grade I Tube type: Oral Tube size: 8.0 mm Number of attempts: 1 Airway Equipment and Method: Stylet and Oral airway Placement Confirmation: ETT inserted through vocal cords under direct vision, positive ETCO2 and breath sounds checked- equal and bilateral Secured at: 22 cm Tube secured with: Tape Dental Injury: Teeth and Oropharynx as per pre-operative assessment

## 2023-12-07 NOTE — Brief Op Note (Signed)
12/07/2023  10:56 AM  PATIENT:  Patricia Alvarado  78 y.o. female  PRE-OPERATIVE DIAGNOSIS:  SEVERE MITRAL REGURGITATION  POST-OPERATIVE DIAGNOSIS:  SEVERE MITRAL REGURGITATION  PROCEDURE:  MITRAL VALVE REPAIR USING SIMULUS SEMI-RIGID ANNULOPLASTY BAND SIZE  TRANSESOPHAGEAL ECHOCARDIOGRAM (TEE)   SURGEON:  Surgeons and Role:    Eugenio Hoes, MD - Primary  PHYSICIAN ASSISTANT: Aloha Gell PA-C  ASSISTANTS: Virgilio Frees RNFA   ANESTHESIA:   general  EBL:  410 mL   BLOOD ADMINISTERED:none  DRAINS:  Mediastinal drains    LOCAL MEDICATIONS USED:  NONE  SPECIMEN:  No Specimen  DISPOSITION OF SPECIMEN:  N/A  COUNTS:  YES  DICTATION: .Dragon Dictation  PLAN OF CARE: Admit to inpatient   PATIENT DISPOSITION:  ICU - intubated and hemodynamically stable.   Delay start of Pharmacological VTE agent (>24hrs) due to surgical blood loss or risk of bleeding: yes

## 2023-12-07 NOTE — Progress Notes (Signed)
Called [employer] to have rep notified of 0730 surgery.

## 2023-12-07 NOTE — Telephone Encounter (Signed)
[employer] FMLA form for son,Patricia Alvarado, was scanned to chart. Billing notified.

## 2023-12-07 NOTE — Progress Notes (Signed)
Rapid wean initiated per protocol

## 2023-12-07 NOTE — Hospital Course (Addendum)
  Patricia Maw, MD Patricia Sandy [name], NP   History of Present Illness:     [name] is Alvarado 78 year old female with Alvarado past medical history of hypertension, hyperlipidemia, hypothyroidism, h/o breast cancer and radiation, paroxysmal atrial fibrillation, s/p ICD for VT, and MR. She has had Alvarado complicated cardiac history over the past several years. The patient was found to have atrial fibrillation in 2022 and underwent ablation procedure with success, but has had Alvarado life long issue with PVCs. This worsened to the point of requiring attempted Ventricular ablation, that was not felt possible and ended up with an ICD. She is still on Eliquis and has more irregular beats at night but overall is in NSR. She was noted to have mitral regurgitation which has been mild for many years. 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 She has annular disjunction. She was felt best served with MV surgery and Cath was performed without CAD. She has no PHTN. Of note patient had Alvarado bilateral mastectomy preventively in 1992 but developed nipple cancer and had resection and radiation therapy in 2022 with now radiation injury to her back skin and right lung. She feels this may also be contributing to her SOB.  Dr. Leafy Ro reviewed the patient's diagnostic studies and determined she would benefit from surgical intervention. He reviewed the treatment options with the patient as well as the risks and benefits of surgery. [name] was agreeable to proceed with surgery.  Hospital Course: [name] presented to the hospital and was brought to the operating room on 02/11. She underwent mitral valve repair utilizing Alvarado 34mm simulus semi rigid annuloplasty band. She tolerated the procedure well and was transferred to the SICU in stable condition.   Postoperative hospital course:  Patient was extubated using standard post  cardiac surgical protocols without difficulty.  He has remained hemodynamically stable.  Swan-Ganz and arterial line were removed on postop day 1 as were pacer wires.  She does have an expected acute blood loss anemia which is being followed clinically.  She is diuresed appropriately for expected volume overload not related to congestive heart failure and clinically not significant.  Chest tubes were removed on postop day #2.  She does have Alvarado reactive thrombocytopenia which is being monitored over time.  She has been started on Coumadin.  She was placed on routine amiodarone prophylaxis per Dr. Karolee Ohs protocol.  She was felt stable for transfer to the progressive care unit on 12/09/2023.  She developed blood loss anemia which she was symptomatic from.  Her hemoglobin level dropped to 6.5 requiring transfusion of 1 unit of packed cells.  She was started on iron therapy.  Repeat H/H was 8.3/24.6.  Her platelet count recovered without further intervention. She was routinely diuresed.

## 2023-12-07 NOTE — Op Note (Signed)
CARDIOVASCULAR SURGERY OPERATIVE NOTE  12/07/2023 Patricia Alvarado [id number]  Surgeon:  Ashley Akin, MD  First Assistant: Aloha Gell [facility]                               An experienced assistant was required given the complexity of this surgery and the standard of surgical care. The assistant was needed for exposure, dissection, suctioning, retraction of delicate tissues and sutures, instrument exchange and for overall help during this procedure.     Preoperative Diagnosis:  Severe Mitral Regurgitation  Postoperative Diagnosis:  Same   Procedure: Mitral Valve Ring annulplasty with a 34 mm Simulus Semi Rigid Band  Anesthesia:  General Endotracheal   Clinical History/Surgical Indication:  78 yo female with NYHA class 2 symptoms of severe MR with normal LV function and no CAD or PHTN with long history of arrhythmias sp atrial ablation and with AICD. Pt has a class I indication for MV surgery and hopefully with repair however with extensive prolapse of entire posterior leaflet and anterior myxomatous valve may be more difficult and tissue valve replacement would be fall bac   Fingings: Normal LV function with mild AI and severe MR. Pt with multiple prolapsed portions of posterior leaflet predominantly P1 and P2 and some of A2. At conclusion of repair with ring there was no MR and a mean gradient of and no SAM  Preparation:  The patient was seen in the preoperative holding area and the correct patient, correct operation were confirmed with the patient after reviewing the medical record and catheterization. The consent was signed by me. Preoperative antibiotics were given. A pulmonary arterial line and radial arterial line were placed by the anesthesia team. The patient was taken back to the operating room and positioned supine on the operating room table. After being placed under general endotracheal anesthesia by the anesthesia team a foley catheter was placed. The neck, chest,  abdomen, and both legs were prepped with betadine soap and solution and draped in the usual sterile manner. A surgical time-out was taken and the correct patient and operative procedure were confirmed with the nursing and anesthesia staff.  Operation: Median sternotomy incision was then created sternal valve the sternal saw.  Simultaneously a pericardial well was developed and heparin delivered.  The aorta was cannulated with a 20 [place] Starns aortic cannula and a 36 French straight cannulas placed at the right atrial appendage and directed towards inferior vena cava.  An additional 56 French right angle cannulas placed in the superior vena cava.  An antegrade and retrograde cardioplegia catheters were placed in ascending aorta coronary sinus effectively.  With adequate confirmation of anticoagulation cardiopulmonary bypass was instituted.  Aortic cross-clamp was placed and cold [place] blood cardioplegia was delivered for a liter.  Reanimation dose was delivered as part of cross-clamp removal. Left atrium was opened in each atrial groove and excellent exposure of the mitral valve was obtained.  3-0 Ethibond sutures were then placed in the posterior annulus in anticipation of a band and this was sized to a 34 mm band.  This was prepared and brought onto the field and secured with the cor knot system.  On saline load testing there was excellent coaptation of the leaflets and no other repair techniques were required.  Left atrium was then closed with a running 4-0 Prolene suture over a ventricular sump.  With the patient in headdown position aortic cross-clamp was removed and  ventricular and atrial pacing wires were brought out the inferior stab wounds and secured. With adequate de-airing the ventricular sump was removed and the patient was weaned cardiopulmonary bypass on inotropic support.  With adequate hemodynamics protamine was delivered the patient was decannulated and sites oversewn were necessary.   Chest tubes were brought inferior stab was and secured with adequate hemostasis the sternum was reapproximated with interrupted stainless steel wire and the presternal subcutaneous tissue and skin were closed in multiple layers absorbable suture.  Sterile dressings were applied.

## 2023-12-07 NOTE — Anesthesia Procedure Notes (Addendum)
Arterial Line Insertion Start/End2/08/2024 7:00 AM, 12/07/2023 7:10 AM Performed by: Darryl Nestle, CRNA, CRNA  Patient location: Pre-op. Preanesthetic checklist: patient identified, IV checked, site marked, risks and benefits discussed, surgical consent, monitors and equipment checked, pre-op evaluation, timeout performed and anesthesia consent Lidocaine 1% used for infiltration Left, radial was placed Catheter size: 20 G Hand hygiene performed  and maximum sterile barriers used   Attempts: 2 Procedure performed using ultrasound guided technique. Ultrasound Notes:anatomy identified, needle tip was noted to be adjacent to the nerve/plexus identified and no ultrasound evidence of intravascular and/or intraneural injection Following insertion, dressing applied and Biopatch. Post procedure assessment: normal and unchanged  Post procedure complications: unsuccessful attempts and second provider assisted. Patient tolerated the procedure well with no immediate complications. Additional procedure comments: CRNA assisted SRNA.

## 2023-12-07 NOTE — Interval H&P Note (Signed)
History and Physical Interval Note:  12/07/2023 6:26 AM  Patricia Alvarado  has presented today for surgery, with the diagnosis of SEVERE MR.  The various methods of treatment have been discussed with the patient and family. After consideration of risks, benefits and other options for treatment, the patient has consented to  Procedure(s): MITRAL VALVE REPAIR, possible replacement (MVR) (N/A) TRANSESOPHAGEAL ECHOCARDIOGRAM (TEE) (N/A) as a surgical intervention.  The patient's history has been reviewed, patient examined, no change in status, stable for surgery.  I have reviewed the patient's chart and labs.  Questions were answered to the patient's satisfaction.     Eugenio Hoes

## 2023-12-07 NOTE — Consult Note (Signed)
NAME:  Patricia Alvarado, MRN:  [id number], DOB:  [birth year], LOS: 0 ADMISSION DATE:  12/07/2023 CONSULTATION DATE:  12/07/2023 REFERRING MD:  Leafy Ro - TCTS, CHIEF COMPLAINT:  Postoperative vent management s/p MVR   History of Present Illness:  Patricia Alvarado is seen in consultation at the request of Dr. Leafy Ro (TCTS) for management of mechanical ventilation and hemodynamics post-MVR.  78 year old woman who presented to [facility] 2/11 for scheduled MVR. PMHx significant for HTN, HLD, PAF, VT (s/p AICD), MVP with severe MR, hypothyroidism, GERD, migraines, fibromyalgia, anxiety/depression, skin CA (basal cell carcinoma, malignant melanoma).   Underwent MV repair/annuloplasty with Dr. Leafy Ro 2/11. Intraoperative course was uncomplicated with normal BiV function and no further MR noted at the end of the case. EBL . Required Neosynephrine throughout the case.  PCCM consulted for postoperative ventilator management.  Pertinent Medical History:   Past Medical History:  Diagnosis Date   A-fib [facility])    AICD (automatic cardioverter/defibrillator) present    Medtr[employer]. Managed by Dr. Lewayne Bunting   Anxiety    Arthritis    thumb   Basal cell carcinoma 2001   "forehead, between eyebrows" right arm (2022)   Breast cancer (HCC)    Carcinoma of thyroid gland (HCC) 2001   Chronic lower back pain    "worse is across my hips" (05/26/2016)   Depression    Dyslipidemia    Dyspnea    Dysrhythmia    A. Fib   Family history of breast cancer    Family history of kidney cancer    Family history of leukemia    Family history of nonmelanoma skin cancer    Fibrocystic breast    Fibromyalgia    GERD (gastroesophageal reflux disease)    Heart murmur    History of blood transfusion 1992   "after subcutaneous mastectomies"   History of hiatal hernia    Hypertension    not on any medications   Hypothyroidism    Malignant melanoma of left ankle (HCC) 2001   Migraine    "visual; 2-3 times/year" (05/26/2016)    Mitral valve prolapse    PONV (postoperative nausea and vomiting)    Presence of permanent cardiac pacemaker    Medtronic   Ventricular tachycardia (HCC)    Hx of, controlled on sotalol therapy   Significant Hospital Events: Including procedures, antibiotic start and stop dates in addition to other pertinent events   2/11 - MVR. CCM consulted for MV/hemodynamics.  Interim History / Subjective:  PCCM consulted for vent management post-MVR.  Objective:  Blood pressure (!) 151/59, pulse 69, temperature (!) 97.5 F (36.4 C), resp. rate 18, height 5' 4.5" (1.638 m), weight 66.5 kg, SpO2 96%.        Intake/Output Summary (Last 24 hours) at 12/07/2023 1007 Last data filed at 12/07/2023 0955 Gross per 24 hour  Intake 1000 ml  Output 100 ml  Net 900 ml   Filed Weights   12/07/23 0600  Weight: 66.5 kg   Physical Examination: General: Acutely ill-appearing older woman in NAD. HEENT: [place]/AT, anicteric sclera, PERRL 2mm, moist mucous membranes. Neuro:  Intubated, sedated.  Responds/opens eyes to tactile stimuli. Not following commands. No spontaneous movement of extremities on my exam. +Cough and +Gag  CV: RRR (paced), no m/g/r. Midline sternotomy with OR dressing in place, clean/dry/intact, scant sanguinous strikethrough. CT with scant sanguinous output. PULM: Breathing even and unlabored on vent (PEEP 5, FiO2 50%, SIMV). Lung fields CTAB. GI: Soft, nontender, nondistended. Normoactive bowel sounds. Extremities: Trace symmetric  BLE edema noted. Skin: Warm/dry, no rashes. Bilateral mastectomy scars noted, well-healed. R nipple absent s/p surgical removal.  Resolved Hospital Problem List:    Assessment & Plan:  Patricia Alvarado is seen in consultation at the request of Dr. Leafy Ro (TCTS) for management of mechanical ventilation and hemodynamics post-MVR.  78 year old woman who presented to [facility] 2/11 for scheduled MVR. PMHx significant for HTN, HLD, PAF, VT (s/p AICD), MVP with severe MR,  hypothyroidism, GERD, migraines, fibromyalgia, anxiety/depression, skin CA (basal cell carcinoma, malignant melanoma).. Underwent MVR with Dr. Leafy Ro 2/11.   Postoperative vent management - Continue full vent support (4-8cc/kg IBW) - Wean FiO2 for O2 sat > 90% - Daily WUA/SBT, rapid wean with SIMV per protocol - VAP bundle - Pulmonary hygiene - F/u ABG - PAD protocol for sedation: Precedex and Versed for goal RASS 0 to -1 - F/u CXR, requested advancement x 2cm  S/p MVR MVP with severe MR - Monitor cardiac indices - Continue amiodarone - Neosynephrine titrated to goal MAP/SBP - Insulin gtt per protocol - Continue ASA - Postoperative care per TCTS - CT management per protocol - Multimodal pain management (methadone given intraoperatively, morphine, oxycodone, tramadol, APAP)  PAF VT s/p AICD, followed by Dr. Ladona Ridgel - Hold home sotalol in the immediate postoperative period - Optimize electrolytes for K > 4, Mg > 2 - Hold home Eliquis  HTN HLD - Not on antihypertensive medications at home - ASA as above, resume home Zetia - Not on statin due to myalgias  Hypothyroidism - Continue home Armour  GERD - PPI  Migraines Fibromyalgia Anxiety Depression - Hold home pain medications for now (Robaxin) - Resume home Lexapro - May require anxiolytics once PAD protocol discontinued, monitor  Best Practice: (right click and "Reselect all SmartList Selections" daily)   Diet/type: NPO DVT prophylaxis: SCDs GI prophylaxis: PPI Lines: Central line and Arterial Line Foley:  Yes, and it is still needed Code Status:  full code Last date of multidisciplinary goals of care discussion [Per TCTS]  Labs:  CBC: Recent Labs  Lab 12/03/23 1134 12/07/23 0751 12/07/23 0835 12/07/23 0845 12/07/23 0855 12/07/23 0919 12/07/23 0928  WBC 7.0  --   --   --   --   --   --   HGB 13.9   < > 10.5* 7.8* 8.2* 7.5* 8.0*  HCT 42.0   < > 31.0* 23.0* 24.0* 22.0* 23.5*  MCV 95.2  --   --   --    --   --   --   PLT 211  --   --   --   --   --  130*   < > = values in this interval not displayed.   Basic Metabolic Panel: Recent Labs  Lab 12/03/23 1134 12/07/23 0751 12/07/23 0755 12/07/23 0835 12/07/23 0845 12/07/23 0855 12/07/23 0919  NA 139 141 140 138 138 138 139  K 4.1 3.3* 3.3* 3.4* 3.8 4.5 4.2  CL 103 105  --  105  --   --  104  CO2 27  --   --   --   --   --   --   GLUCOSE 91 127*  --  110*  --   --  102*  BUN 20 22  --  20  --   --  20  CREATININE 0.69 0.50  --  0.50  --   --  0.40*  CALCIUM 9.8  --   --   --   --   --   --  GFR: Estimated Creatinine Clearance: 52.8 mL/min (A) (by C-G formula based on SCr of 0.4 mg/dL (L)). Recent Labs  Lab 12/03/23 1134  WBC 7.0   Liver Function Tests: Recent Labs  Lab 12/03/23 1134  AST 27  ALT 19  ALKPHOS 71  BILITOT 0.6  PROT 7.0  ALBUMIN 3.7   No results for input(s): "LIPASE", "AMYLASE" in the last 168 hours. No results for input(s): "AMMONIA" in the last 168 hours.  ABG:    Component Value Date/Time   PHART 7.461 (H) 12/07/2023 0845   PCO2ART 31.4 (L) 12/07/2023 0845   PO2ART 443 (H) 12/07/2023 0845   HCO3 23.9 12/07/2023 0855   TCO2 28 12/07/2023 0919   ACIDBASEDEF 1.0 12/07/2023 0845   O2SAT 84 12/07/2023 0855    Coagulation Profile: Recent Labs  Lab 12/03/23 1134  INR 1.0   Cardiac Enzymes: No results for input(s): "CKTOTAL", "CKMB", "CKMBINDEX", "TROPONINI" in the last 168 hours.  HbA1C: Hgb A1c MFr Bld  Date/Time Value Ref Range Status  12/03/2023 11:34 AM 5.4 4.8 - 5.6 % Final    Comment:    (NOTE) Pre diabetes:          5.7%-6.4%  Diabetes:              >6.4%  Glycemic control for   <7.0% adults with diabetes    CBG: No results for input(s): "GLUCAP" in the last 168 hours.  Review of Systems:   Patient is encephalopathic and/or intubated; therefore, history has been obtained from chart review.   Past Medical History:  She,  has a past medical history of A-fib (HCC), AICD  (automatic cardioverter/defibrillator) present, Anxiety, Arthritis, Basal cell carcinoma (2001), Breast cancer (HCC), Carcinoma of thyroid gland (HCC) (2001), Chronic lower back pain, Depression, Dyslipidemia, Dyspnea, Dysrhythmia, Family history of breast cancer, Family history of kidney cancer, Family history of leukemia, Family history of nonmelanoma skin cancer, Fibrocystic breast, Fibromyalgia, GERD (gastroesophageal reflux disease), Heart murmur, History of blood transfusion (1992), History of hiatal hernia, Hypertension, Hypothyroidism, Malignant melanoma of left ankle (HCC) (2001), Migraine, Mitral valve prolapse, PONV (postoperative nausea and vomiting), Presence of permanent cardiac pacemaker, and Ventricular tachycardia (HCC).   Surgical History:   Past Surgical History:  Procedure Laterality Date   ABDOMINAL HYSTERECTOMY  1998   ANTERIOR CERVICAL DECOMP/DISCECTOMY FUSION  2001   ATRIAL FIBRILLATION ABLATION N/A 09/12/2021   Procedure: ATRIAL FIBRILLATION ABLATION;  Surgeon: Regan Lemming, MD;  Location: [facility] INVASIVE CV LAB;  Service: Cardiovascular;  Laterality: N/A;   BASAL CELL CARCINOMA EXCISION  2001   "cut it out & did a flap, on forehead between my eyebrows"   BREAST IMPLANT EXCHANGE Bilateral 2001   [id number]   BREAST IMPLANT EXCHANGE Left 05/05/2021   Procedure: BREAST IMPLANT EXCHANGE;  Surgeon: Peggye Form, DO;  Location: [facility] OR;  Service: Plastics;  Laterality: Left;   BREAST IMPLANT REMOVAL Right 02/05/2020   Procedure: REMOVAL RIGHT BREAST IMPLANT AND CAPSULECTOMY;  Surgeon: Griselda Miner, MD;  Location: [facility];  Service: General;  Laterality: Right;   BREAST LUMPECTOMY Right 02/05/2020   Procedure: RIGHT BREAST CENTRAL LUMPECTOMY;  Surgeon: Griselda Miner, MD;  Location: [facility];  Service: General;  Laterality: Right;   BREAST RECONSTRUCTION WITH PLACEMENT OF TISSUE EXPANDER AND FLEX HD (ACELLULAR HYDRATED DERMIS) Right  01/20/2021   Procedure: BREAST RECONSTRUCTION WITH PLACEMENT OF TISSUE EXPANDER AND FLEX HD (ACELLULAR HYDRATED DERMIS);  Surgeon: Peggye Form, DO;  Location: [facility] OR;  Service: Government social research officer;  Laterality: Right;  2 hours   CARPAL TUNNEL RELEASE Right 2006   COLONOSCOPY     DILATION AND CURETTAGE OF UTERUS  1970s X 2-3   ELECTROPHYSIOLOGIC STUDY  1994 X 2;2001   "to see it it was sustained VT; cause thyroid levels were causing arrhythmias"   EYE SURGERY  12/2020   cataracts   HYSTEROTOMY  1994   ICD IMPLANT N/A 12/28/2022   Procedure: ICD IMPLANT;  Surgeon: [name], MD;  Location: [facility]VAS[facility] CV [facility];  Service: Cardiovascular;  Laterality: N/A;   LAPAROSCOPIC CHOLECYSTECTOMY  2004   MASTECTOMY Bilateral 1992   "subcutaneous"   MELANOMA EXCISION Left 2001   "ankle, stage I"   PLACEMENT OF BREAST IMPLANTS Bilateral 1992   [id number]   PVC ABLATION N/A 12/23/2022   Procedure: PVC ABLATION;  Surgeon: Regan Lemming, MD;  Location: [facility];  Service: Cardiovascular;  Laterality: N/A;   REMOVAL OF TISSUE EXPANDER AND PLACEMENT OF IMPLANT Right 05/05/2021   Procedure: REMOVAL OF TISSUE EXPANDER AND PLACEMENT OF IMPLANT;  Surgeon: Peggye Form, DO;  Location: [facility] OR;  Service: Plastics;  Laterality: Right;   RIGHT/LEFT HEART CATH AND CORONARY ANGIOGRAPHY N/A 11/11/2023   Procedure: RIGHT/LEFT HEART CATH AND CORONARY ANGIOGRAPHY;  Surgeon: Kathleene Hazel, MD;  Location: [facility];  Service: Cardiovascular;  Laterality: N/A;   TOTAL THYROIDECTOMY  12/1999   "cancer"   TRANSESOPHAGEAL ECHOCARDIOGRAM (CATH LAB) N/A 10/06/2023   Procedure: TRANSESOPHAGEAL ECHOCARDIOGRAM;  Surgeon: Tessa Lerner, DO;  Location: [facility];  Service: Cardiovascular;  Laterality: N/A;   VENTRICULAR ABLATION SURGERY  2011   ventricular tchycardia   Social History:   reports that she has never smoked. She has never been exposed to tobacco smoke. She has never  used smokeless tobacco. She reports that she does not drink alcohol and does not use drugs.   Family History:  Her family history includes Arrhythmia in her brother; Basal cell carcinoma (age of onset: 57) in her mother; Breast cancer in an other family member; Breast cancer (age of onset: 11) in her maternal grandmother; Cancer in her maternal aunt; Heart attack in her brother; Heart disease in her brother; Heart disease (age of onset: 34) in her father; Hyperlipidemia (age of onset: 20) in her mother; Hypertension in her brother; Leukemia in her maternal aunt; Lung cancer (age of onset: 31) in her maternal aunt; Osteoporosis in her mother; Other in her brother, father, and paternal aunt; Squamous cell carcinoma (age of onset: 40) in her brother. There is no history of Thyroid cancer.   Allergies: Allergies  Allergen Reactions   Quinidine Palpitations    REACTION: increased heart rate   Statins     Muscle pain   Tape     If left on long enough,will tear skin    Amoxicillin Itching and Rash   Atenolol Hives   Dofetilide Other (See Comments)    Elevated QTCs   Mexiletine Hcl Palpitations    Dizzy and extreme tiredness   Nadolol Hives   Vicodin  [Hydrocodone-Acetaminophen] Swelling    Facial redness and swelling   Home Medications: Prior to Admission medications   Medication Sig Start Date End Date Taking? Authorizing Provider  acetaminophen (TYLENOL) 500 MG tablet Take 1,000 mg by mouth every 6 (six) hours as needed for mild pain or moderate pain.   Yes [provider]  ALPRAZolam Prudy Feeler) 0.5 MG tablet Take 1 tablet (0.5 mg total)  by mouth at bedtime as needed for sleep Patient taking differently: Take 0.25 mg by mouth at bedtime. 08/12/23  Yes   apixaban (ELIQUIS) 5 MG TABS tablet Take 1 tablet (5 mg total) by mouth 2 (two) times daily. 10/29/23  Yes [name], MD  Ascorbic Acid (VITAMIN C) 1000 MG tablet Take 1,000 mg by mouth daily.   Yes [provider]  B  Complex-C (SUPER B COMPLEX PO) Take 1 capsule by mouth at bedtime.   Yes [provider]  beta carotene 40981 UNIT capsule Take 10,000 Units by mouth at bedtime.   Yes [provider]  Biotin 5 MG CAPS Take 5 mg by mouth at bedtime.    Yes [provider]  Calcium 600-200 MG-UNIT tablet Take 1 tablet by mouth daily.   Yes [provider]  cetirizine (ZYRTEC) 10 MG tablet Take 10 mg by mouth at bedtime.   Yes [provider]  Cholecalciferol (VITAMIN D) 50 MCG (2000 UT) CAPS Take 2,000 Units by mouth daily.   Yes [provider]  Coenzyme Q10 (COQ-10) 100 MG CAPS Take 100 mg by mouth daily.   Yes [provider]  Digestive Enzyme CAPS Take 1 capsule by mouth daily as needed (heartburn).   Yes [provider]  escitalopram (LEXAPRO) 20 MG tablet Take 1 tablet (20 mg total) by mouth daily. 12/23/22  Yes   ezetimibe (ZETIA) 10 MG tablet Take 1 tablet (10 mg total) by mouth daily. 08/12/23  Yes   famotidine (PEPCID) 20 MG tablet Take 20 mg by mouth as needed for heartburn or indigestion.   Yes [provider]  fluticasone (FLONASE) 50 MCG/ACT nasal spray Place 1 spray into both nostrils daily as needed for allergies. 01/31/18  Yes [provider]  furosemide (LASIX) 20 MG tablet Take 20 mg by mouth daily as needed for fluid (1-2lb weight gain overnight).   Yes [provider]  hydrocortisone cream 1 % Apply 1 Application topically daily as needed for itching.   Yes [provider]  MAGNESIUM MALATE PO Take 1,000 mg by mouth daily.   Yes [provider]  MANGANESE PO Take 40 mg by mouth at bedtime.   Yes [provider]  Melatonin 10 MG TABS Take 10 mg by mouth at bedtime.    Yes [provider]  Menatetrenone (VITAMIN K2) 100 MCG TABS Take 100 mcg by mouth daily.   Yes [provider]  methocarbamol (ROBAXIN) 500 MG tablet Take 1 tablet (500 mg total) by mouth  every 8 (eight) hours as needed for neck pain 09/29/23  Yes   omeprazole (PRILOSEC) 40 MG capsule Take 1 capsule (40 mg total) by mouth every morning 30 minutes before breakfast 12/06/23  Yes   Polyvinyl Alcohol (LIQUID TEARS OP) Place 1 drop into both eyes daily as needed (dry eyes).   Yes [provider]  sotalol (BETAPACE) 160 MG tablet Take 1 tablet (160 mg total) by mouth 2 (two) times daily. 10/05/23  Yes Camnitz, Andree Coss, MD  thyroid (ARMOUR) 120 MG tablet Take 120 mg by mouth daily before breakfast.   Yes [provider]  triamcinolone (KENALOG) 0.025 % cream Apply 1 Application topically 2 (two) times daily as needed (rash).   Yes [provider]  zinc gluconate 50 MG tablet Take 50 mg by mouth daily.   Yes [provider]  potassium chloride SA (KLOR-CON M) 20 MEQ tablet Take 1 tablet (20 mEq total) by  mouth 2 (two) times daily. Patient taking differently: Take 20 mEq by mouth daily as needed (Low potassium). 12/29/22   Sherie Don, NP  Simethicone 125 MG CAPS Take 250 mg by mouth daily as needed (bloating).    [provider]    Critical care time:   The patient is critically ill with multiple organ system failure and requires high complexity decision making for assessment and support, frequent evaluation and titration of therapies, advanced monitoring, review of radiographic studies and interpretation of complex data.   Critical Care Time devoted to patient care services, exclusive of separately billable procedures, described in this note is 34 minutes.  Tim Lair, PA-C [facility] 12/07/23 10:07 AM  Please see [email or web address] for pager details.  From 7A-7P if no response, please call [phone] After hours, please call ELink [phone]

## 2023-12-07 NOTE — Procedures (Signed)
Extubation Procedure Note  Patient Details:   Name: Jaiah Weigel DOB: 06/02/1946 MRN: [id number]   Airway Documentation:    Vent end date: 12/07/23 Vent end time: 2230   Evaluation  O2 sats: stable throughout Complications: No apparent complications Patient did tolerate procedure well. Bilateral Breath Sounds: Clear   Yes  Pt extubated to 5L [place] per protocol. NIF: -26 VC: IS:126ml. Positive cuff leak, no stridor noted. Pt able to say her name   Berton Bon 12/07/2023, 10:31 PM

## 2023-12-08 ENCOUNTER — Inpatient Hospital Stay (HOSPITAL_COMMUNITY): Payer: PPO

## 2023-12-08 ENCOUNTER — Telehealth: Payer: Self-pay | Admitting: Internal Medicine

## 2023-12-08 ENCOUNTER — Encounter: Payer: Self-pay | Admitting: Internal Medicine

## 2023-12-08 ENCOUNTER — Encounter (HOSPITAL_COMMUNITY): Payer: Self-pay | Admitting: Thoracic Surgery (Cardiothoracic Vascular Surgery)

## 2023-12-08 DIAGNOSIS — R739 Hyperglycemia, unspecified: Secondary | ICD-10-CM

## 2023-12-08 DIAGNOSIS — I340 Nonrheumatic mitral (valve) insufficiency: Principal | ICD-10-CM

## 2023-12-08 DIAGNOSIS — R1319 Other dysphagia: Secondary | ICD-10-CM

## 2023-12-08 DIAGNOSIS — Z9889 Other specified postprocedural states: Secondary | ICD-10-CM

## 2023-12-08 LAB — BASIC METABOLIC PANEL WITH GFR
Anion gap: 16 — ABNORMAL HIGH (ref 5–15)
BUN: 10 mg/dL (ref 8–23)
CO2: 23 mmol/L (ref 22–32)
Calcium: 8.9 mg/dL (ref 8.9–10.3)
Chloride: 101 mmol/L (ref 98–111)
Creatinine, Ser: 0.55 mg/dL (ref 0.44–1.00)
GFR, Estimated: >60 mL/min (ref >60)
Glucose, Bld: 114 mg/dL — ABNORMAL HIGH (ref 70–99)
Potassium: 3.6 mmol/L (ref 3.5–5.1)
Sodium: 140 mmol/L (ref 135–145)

## 2023-12-08 LAB — POCT I-STAT 7, (LYTES, BLD GAS, ICA,H+H)
Acid-base deficit: 1 mmol/L (ref 0.0–2.0)
Bicarbonate: 24.7 mmol/L (ref 20.0–28.0)
Calcium, Ion: 1.18 mmol/L (ref 1.15–1.40)
HCT: 23 % — ABNORMAL LOW (ref 36.0–46.0)
Hemoglobin: 7.8 g/dL — ABNORMAL LOW (ref 12.0–15.0)
O2 Saturation: 99 %
Patient temperature: 37.1
Potassium: 3.8 mmol/L (ref 3.5–5.1)
Sodium: 139 mmol/L (ref 135–145)
TCO2: 26 mmol/L (ref 22–32)
pCO2 arterial: 44.7 mm[Hg] (ref 32–48)
pH, Arterial: 7.35 (ref 7.35–7.45)
pO2, Arterial: 146 mm[Hg] — ABNORMAL HIGH (ref 83–108)

## 2023-12-08 LAB — CBC
HCT: 24.5 % — ABNORMAL LOW (ref 36.0–46.0)
HCT: 23.8 % — ABNORMAL LOW (ref 36.0–46.0)
Hemoglobin: 8.2 g/dL — ABNORMAL LOW (ref 12.0–15.0)
Hemoglobin: 7.7 g/dL — ABNORMAL LOW (ref 12.0–15.0)
MCH: 32.2 pg (ref 26.0–34.0)
MCH: 31.4 pg (ref 26.0–34.0)
MCHC: 33.5 g/dL (ref 30.0–36.0)
MCHC: 32.4 g/dL (ref 30.0–36.0)
MCV: 96.1 fL (ref 80.0–100.0)
MCV: 97.1 fL (ref 80.0–100.0)
Platelets: 100 K/uL — ABNORMAL LOW (ref 150–400)
Platelets: 91 10*3/uL — ABNORMAL LOW (ref 150–400)
RBC: 2.55 MIL/uL — ABNORMAL LOW (ref 3.87–5.11)
RBC: 2.45 MIL/uL — ABNORMAL LOW (ref 3.87–5.11)
RDW: 13.5 % (ref 11.5–15.5)
RDW: 13.5 % (ref 11.5–15.5)
WBC: 8.3 K/uL (ref 4.0–10.5)
WBC: 9.6 10*3/uL (ref 4.0–10.5)
nRBC: 0 % (ref 0.0–0.2)
nRBC: 0 % (ref 0.0–0.2)

## 2023-12-08 LAB — GLUCOSE, CAPILLARY
Glucose-Capillary: 104 mg/dL — ABNORMAL HIGH (ref 70–99)
Glucose-Capillary: 111 mg/dL — ABNORMAL HIGH (ref 70–99)
Glucose-Capillary: 122 mg/dL — ABNORMAL HIGH (ref 70–99)
Glucose-Capillary: 133 mg/dL — ABNORMAL HIGH (ref 70–99)
Glucose-Capillary: 138 mg/dL — ABNORMAL HIGH (ref 70–99)
Glucose-Capillary: 96 mg/dL (ref 70–99)

## 2023-12-08 LAB — BASIC METABOLIC PANEL
Anion gap: 5 (ref 5–15)
BUN: 10 mg/dL (ref 8–23)
CO2: 26 mmol/L (ref 22–32)
Calcium: 8.1 mg/dL — ABNORMAL LOW (ref 8.9–10.3)
Chloride: 104 mmol/L (ref 98–111)
Creatinine, Ser: 0.61 mg/dL (ref 0.44–1.00)
GFR, Estimated: >60 mL/min (ref >60)
Glucose, Bld: 143 mg/dL — ABNORMAL HIGH (ref 70–99)
Potassium: 4.1 mmol/L (ref 3.5–5.1)
Sodium: 135 mmol/L (ref 135–145)

## 2023-12-08 LAB — CREATININE, SERUM
Creatinine, Ser: 0.57 mg/dL (ref 0.44–1.00)
GFR, Estimated: >60 mL/min (ref >60)

## 2023-12-08 LAB — MAGNESIUM
Magnesium: 2.5 mg/dL — ABNORMAL HIGH (ref 1.7–2.4)
Magnesium: 2.2 mg/dL (ref 1.7–2.4)

## 2023-12-08 MED ORDER — INSULIN ASPART 100 UNIT/ML IJ SOLN: 0.0000 [IU] | INTRAMUSCULAR | Status: DC

## 2023-12-08 MED ORDER — CHLORHEXIDINE GLUCONATE CLOTH 2 % EX PADS
6.0000 | MEDICATED_PAD | Freq: Every day | CUTANEOUS | Status: DC
  Administered 2023-12-08 – 2023-12-11 (×4): 6 via TOPICAL

## 2023-12-08 MED ORDER — FUROSEMIDE 10 MG/ML IJ SOLN
40.0000 mg | Freq: Once | INTRAMUSCULAR | Status: AC
  Administered 2023-12-08: 40 mg via INTRAVENOUS
  Filled 2023-12-08: qty 4

## 2023-12-08 MED ORDER — SOTALOL HCL 80 MG PO TABS
160.0000 mg | ORAL_TABLET | Freq: Two times a day (BID) | ORAL | Status: DC
  Administered 2023-12-08 – 2023-12-13 (×11): 160 mg via ORAL
  Filled 2023-12-08 (×11): qty 2

## 2023-12-08 MED ORDER — ORAL CARE MOUTH RINSE: 15.0000 mL | OROMUCOSAL | Status: DC | PRN

## 2023-12-08 MED ORDER — ENOXAPARIN SODIUM 40 MG/0.4ML IJ SOSY: 40.0000 mg | PREFILLED_SYRINGE | Freq: Every day | INTRAMUSCULAR | Status: DC

## 2023-12-08 MED ORDER — POTASSIUM CHLORIDE CRYS ER 20 MEQ PO TBCR
20.0000 meq | EXTENDED_RELEASE_TABLET | ORAL | Status: AC
  Administered 2023-12-08 (×3): 20 meq via ORAL
  Filled 2023-12-08 (×3): qty 1

## 2023-12-08 MED FILL — Potassium Chloride Inj 2 mEq/ML: INTRAVENOUS | Qty: 40 | Status: AC

## 2023-12-08 MED FILL — Lidocaine HCl Local Preservative Free (PF) Inj 2%: INTRAMUSCULAR | Qty: 14 | Status: AC

## 2023-12-08 MED FILL — Sodium Chloride IV Soln 0.9%: INTRAVENOUS | Qty: 2000 | Status: AC

## 2023-12-08 MED FILL — Heparin Sodium (Porcine) Inj 1000 Unit/ML: Qty: 1000 | Status: AC

## 2023-12-08 MED FILL — Electrolyte-R (PH 7.4) Solution: INTRAVENOUS | Qty: 4000 | Status: AC

## 2023-12-08 MED FILL — Sodium Bicarbonate IV Soln 8.4%: INTRAVENOUS | Qty: 50 | Status: AC

## 2023-12-08 MED FILL — Albumin, Human Inj 5%: INTRAVENOUS | Qty: 250 | Status: AC

## 2023-12-08 MED FILL — Heparin Sodium (Porcine) Inj 1000 Unit/ML: INTRAMUSCULAR | Qty: 10 | Status: AC

## 2023-12-08 NOTE — Telephone Encounter (Signed)
Son Casimiro Needle) called to follow-up on the status of patient's FMLA paperwork.  Son stated patient's company had not received a copy and son stated patient would like to get a copy as well.  Son stated copy can be uploaded to MyChart.

## 2023-12-08 NOTE — Plan of Care (Signed)
  Problem: Clinical Measurements: Goal: Diagnostic test results will improve Outcome: Progressing Goal: Respiratory complications will improve Outcome: Progressing Goal: Cardiovascular complication will be avoided Outcome: Progressing   Problem: Pain Managment: Goal: General experience of comfort will improve and/or be controlled Outcome: Progressing   Problem: Nutrition: Goal: Adequate nutrition will be maintained Outcome: Not Progressing

## 2023-12-08 NOTE — Progress Notes (Signed)
Patient ID: Patricia Alvarado, female   DOB: 1946/02/26, 78 y.o.   MRN: [id number]  TCTS Evening Rounds:  Hemodynamically stable after pacing wire and chest tube removal today.  Diuresed -967 cc so far.  Ambulated.  BMET    Component Value Date/Time   NA 135 12/08/2023 1558   NA 141 11/08/2023 1640   K 4.1 12/08/2023 1558   CL 104 12/08/2023 1558   CO2 26 12/08/2023 1558   GLUCOSE 143 (H) 12/08/2023 1558   BUN 10 12/08/2023 1558   BUN 23 11/08/2023 1640   CREATININE 0.61 12/08/2023 1558   CREATININE 0.77 08/06/2020 1316   CALCIUM 8.1 (L) 12/08/2023 1558   EGFR 92 11/08/2023 1640   GFRNONAA >60 12/08/2023 1558   GFRNONAA >60 08/06/2020 1316   CBC    Component Value Date/Time   WBC 9.6 12/08/2023 1558   RBC 2.45 (L) 12/08/2023 1558   HGB 7.7 (L) 12/08/2023 1558   HGB 13.9 11/08/2023 1640   HCT 23.8 (L) 12/08/2023 1558   HCT 42.1 11/08/2023 1640   PLT 91 (L) 12/08/2023 1558   PLT 216 11/08/2023 1640   MCV 97.1 12/08/2023 1558   MCV 94 11/08/2023 1640   MCH 31.4 12/08/2023 1558   MCHC 32.4 12/08/2023 1558   RDW 13.5 12/08/2023 1558   RDW 12.7 11/08/2023 1640   LYMPHSABS 1.7 11/08/2023 1640   MONOABS 0.4 08/06/2020 1316   EOSABS 0.3 11/08/2023 1640   BASOSABS 0.0 11/08/2023 1640   Plts dropped. Will stop Lovenox.

## 2023-12-08 NOTE — Progress Notes (Signed)
NAME:  Patricia Alvarado, MRN:  [id number], DOB:  [birth year], LOS: 1 ADMISSION DATE:  12/07/2023 CONSULTATION DATE:  12/07/2023 REFERRING MD:  Leafy Ro - TCTS, CHIEF COMPLAINT:  Postoperative vent management s/p MVR   History of Present Illness:  Patricia Alvarado is seen in consultation at the request of Dr. Leafy Ro (TCTS) for management of mechanical ventilation and hemodynamics post-MVR.  78 year old woman who presented to [facility] 2/11 for scheduled MVR. PMHx significant for HTN, HLD, PAF, VT (s/p AICD), MVP with severe MR, hypothyroidism, GERD, migraines, fibromyalgia, anxiety/depression, skin CA (basal cell carcinoma, malignant melanoma).   Underwent MV repair/annuloplasty with Dr. Leafy Ro 2/11. Intraoperative course was uncomplicated with normal BiV function and no further MR noted at the end of the case. EBL . Required Neosynephrine throughout the case.  PCCM consulted for postoperative ventilator management.  Pertinent Medical History:   Past Medical History:  Diagnosis Date   A-fib [facility])    AICD (automatic cardioverter/defibrillator) present    Medtronic. Managed by Dr. Lewayne Bunting   Anxiety    Arthritis    thumb   Basal cell carcinoma 2001   "forehead, between eyebrows" right arm (2022)   Breast cancer (HCC)    Carcinoma of thyroid gland (HCC) 2001   Chronic lower back pain    "worse is across my hips" (05/26/2016)   Depression    Dyslipidemia    Dyspnea    Dysrhythmia    A. Fib   Family history of breast cancer    Family history of kidney cancer    Family history of leukemia    Family history of nonmelanoma skin cancer    Fibrocystic breast    Fibromyalgia    GERD (gastroesophageal reflux disease)    Heart murmur    History of blood transfusion 1992   "after subcutaneous mastectomies"   History of hiatal hernia    Hypertension    not on any medications   Hypothyroidism    Malignant melanoma of left ankle (HCC) 2001   Migraine    "visual; 2-3 times/year" (05/26/2016)    Mitral valve prolapse    PONV (postoperative nausea and vomiting)    Presence of permanent cardiac pacemaker    Medtronic   Ventricular tachycardia (HCC)    Hx of, controlled on sotalol therapy   Significant Hospital Events: Including procedures, antibiotic start and stop dates in addition to other pertinent events   2/11 - MVR. CCM consulted for MV/hemodynamics. 2/12 - extubated overnight, AICD turned back on yesterday. Swan, a-line and pacers out today.   Interim History / Subjective:  Still on 20 neo - off an on. Has a few concerns about pain/home meds which are all ordered for her. Cxr with a little more fluid in LLL. Net +2.5L. Getting Lasix today. Stood on side of bed for weight this morning. Will work on [employer] today.   Objective:  Blood pressure (!) 115/51, pulse 94, temperature 99.1 F (37.3 C), resp. rate 11, height 5' 4.5" (1.638 m), weight 72.3 kg, SpO2 95%. PAP: (17-45)/(5-25) 45/25 CVP:  [8 mmHg-12 mmHg] 11 mmHg CO:  [3.6 L/min-5.3 L/min] 5.3 L/min CI:  [2.1 L/min/m2-3.1 L/min/m2] 3.1 L/min/m2  Vent Mode: PSV;CPAP FiO2 (%):  [40 %-50 %] 40 % Set Rate:  [4 bmp-40 bmp] 40 bmp Vt Set:  [440 mL] 440 mL PEEP:  [5 cmH20] 5 cmH20 Pressure Support:  [10 cmH20] 10 cmH20 Plateau Pressure:  [17 cmH20] 17 cmH20   Intake/Output Summary (Last 24 hours) at 12/08/2023 0701 Last  data filed at 12/08/2023 0600 Gross per 24 hour  Intake 5056.4 ml  Output 2465 ml  Net 2591.4 ml   Filed Weights   12/07/23 0600 12/07/23 1226 12/08/23 0500  Weight: 66.5 kg 66.5 kg 72.3 kg   Physical Examination: General: acutely ill appearing, older female, laying in bed, no acute distress  HEENT: [place]/AT, anicteric sclera, PERRL 2mm, mucous membranes dry  Neuro: awake, alert, oriented x4. Non-focal neurological exam CV: s1s2, no appreciable murmur, rub, gallop. Pacer wires in. AICD. CT with serosanguinous output. Midline sternotomy dressing is c/d/I.  PULM: on 4LNC, respirations even and  unlabored. Diminished bases. No wheezing, rhonchi GI: rounded, soft, NTND. +BS Extremities: trace to 1+ edema BLE Skin: warm/dry  Resolved Hospital Problem List:  Post-operative vent management   Assessment & Plan:  S/p MVR MVP with severe MR - con't amiodarone  - con't ASA - Lasix 40mg  today  - Neosynephrine titrated to goal MAP/SBP - trying to wean off  - transitioned off insulin gtt to SSI  - start her home sotalol as below once off of neosynephrine  - swan, a-line, pacer wires out today - Postoperative care per TCTS - monitor cardiac indices  - CT management per protocol - Multimodal pain management (methadone given intraoperatively, morphine, oxycodone, tramadol, APAP)  PAF VT s/p AICD, followed by Dr. Ladona Ridgel - restart home sotalol once off neo  - optimize elytes  - con't to hold home eliquis   HTN HLD - not on home antihypertensives  - con't asa, zetia  - no statin for myalgias   Hypothyroidism - con't armour 120mg  daily   GERD - PPI   Migraines Fibromyalgia Anxiety Depression - con't home lexapro  - holding home robaxin, though if pain doesn't seemed controlled with above, can restart this as well  - PRN Xanax - note - she takes 0.25mg  nightly at home for years, monitor for withdrawal. May need to schedule   Best Practice: (right click and "Reselect all SmartList Selections" daily)   Diet/type: clear liquids DVT prophylaxis: SCDs GI prophylaxis: PPI Lines: Central line, Arterial Line, and No longer needed.  Order written to d/c  Foley:  Yes, and it is still needed Code Status:  full code Last date of multidisciplinary goals of care discussion [Per TCTS]  Labs:  CBC: Recent Labs  Lab 12/03/23 1134 12/07/23 0751 12/07/23 0928 12/07/23 1002 12/07/23 1106 12/07/23 1108 12/07/23 1730 12/07/23 1818 12/07/23 2224 12/08/23 0201 12/08/23 0400  WBC 7.0  --   --   --  7.5  --  7.7  --   --   --  8.3  HGB 13.9   < > 8.0*   < > 8.3*   < > 8.2* 7.8*  8.2* 7.8* 8.2*  HCT 42.0   < > 23.5*   < > 24.7*   < > 25.0* 23.0* 24.0* 23.0* 24.5*  MCV 95.2  --   --   --  94.6  --  95.4  --   --   --  96.1  PLT 211  --  130*  --  94*  --  107*  --   --   --  100*   < > = values in this interval not displayed.   Basic Metabolic Panel: Recent Labs  Lab 12/03/23 1134 12/07/23 0751 12/07/23 0835 12/07/23 0845 12/07/23 0919 12/07/23 1002 12/07/23 1006 12/07/23 1730 12/07/23 1818 12/07/23 2224 12/08/23 0201 12/08/23 0400  NA 139   < > 138   < >  139 139   < > 139 141 141 139 140  K 4.1   < > 3.4*   < > 4.2 4.4   < > 4.1 4.1 3.7 3.8 3.6  CL 103   < > 105  --  104 105  --  108  --   --   --  101  CO2 27  --   --   --   --   --   --  22  --   --   --  23  GLUCOSE 91   < > 110*  --  102* 124*  --  142*  --   --   --  114*  BUN 20   < > 20  --  20 22  --  15  --   --   --  10  CREATININE 0.69   < > 0.50  --  0.40* 0.40*  --  0.58  --   --   --  0.55  CALCIUM 9.8  --   --   --   --   --   --  7.6*  --   --   --  8.9  MG  --   --   --   --   --   --   --  3.2*  --   --   --  2.5*   < > = values in this interval not displayed.   GFR: Estimated Creatinine Clearance: 59 mL/min (by C-G formula based on SCr of 0.55 mg/dL). Recent Labs  Lab 12/03/23 1134 12/07/23 1106 12/07/23 1730 12/08/23 0400  WBC 7.0 7.5 7.7 8.3   Liver Function Tests: Recent Labs  Lab 12/03/23 1134  AST 27  ALT 19  ALKPHOS 71  BILITOT 0.6  PROT 7.0  ALBUMIN 3.7   No results for input(s): "LIPASE", "AMYLASE" in the last 168 hours. No results for input(s): "AMMONIA" in the last 168 hours.  ABG:    Component Value Date/Time   PHART 7.350 12/08/2023 0201   PCO2ART 44.7 12/08/2023 0201   PO2ART 146 (H) 12/08/2023 0201   HCO3 24.7 12/08/2023 0201   TCO2 26 12/08/2023 0201   ACIDBASEDEF 1.0 12/08/2023 0201   O2SAT 99 12/08/2023 0201    Coagulation Profile: Recent Labs  Lab 12/03/23 1134 12/07/23 1106  INR 1.0 1.5*   Cardiac Enzymes: No results for input(s):  "CKTOTAL", "CKMB", "CKMBINDEX", "TROPONINI" in the last 168 hours.  HbA1C: Hgb A1c MFr Bld  Date/Time Value Ref Range Status  12/03/2023 11:34 AM 5.4 4.8 - 5.6 % Final    Comment:    (NOTE) Pre diabetes:          5.7%-6.4%  Diabetes:              >6.4%  Glycemic control for   <7.0% adults with diabetes    CBG: Recent Labs  Lab 12/07/23 1601 12/07/23 1700 12/07/23 1930 12/07/23 2300 12/08/23 0402  GLUCAP 100* 141* 161* 121* 111*    Review of Systems:   As above  Past Medical History:  She,  has a past medical history of A-fib (HCC), AICD (automatic cardioverter/defibrillator) present, Anxiety, Arthritis, Basal cell carcinoma (2001), Breast cancer (HCC), Carcinoma of thyroid gland (HCC) (2001), Chronic lower back pain, Depression, Dyslipidemia, Dyspnea, Dysrhythmia, Family history of breast cancer, Family history of kidney cancer, Family history of leukemia, Family history of nonmelanoma skin cancer, Fibrocystic breast, Fibromyalgia, GERD (gastroesophageal reflux disease), Heart murmur, History of  blood transfusion (1992), History of hiatal hernia, Hypertension, Hypothyroidism, Malignant melanoma of left ankle (HCC) (2001), Migraine, Mitral valve prolapse, PONV (postoperative nausea and vomiting), Presence of permanent cardiac pacemaker, and Ventricular tachycardia (HCC).   Surgical History:   Past Surgical History:  Procedure Laterality Date   ABDOMINAL HYSTERECTOMY  1998   ANTERIOR CERVICAL DECOMP/DISCECTOMY FUSION  2001   ATRIAL FIBRILLATION ABLATION N/A 09/12/2021   Procedure: ATRIAL FIBRILLATION ABLATION;  Surgeon: Regan Lemming, MD;  Location: [facility];  Service: Cardiovascular;  Laterality: N/A;   BASAL CELL CARCINOMA EXCISION  2001   "cut it out & did a flap, on forehead between my eyebrows"   BREAST IMPLANT EXCHANGE Bilateral 2001   [id number]   BREAST IMPLANT EXCHANGE Left 05/05/2021   Procedure: BREAST IMPLANT EXCHANGE;  Surgeon: Peggye Form, DO;  Location: [facility] OR;  Service: Plastics;  Laterality: Left;   BREAST IMPLANT REMOVAL Right 02/05/2020   Procedure: REMOVAL RIGHT BREAST IMPLANT AND CAPSULECTOMY;  Surgeon: Griselda Miner, MD;  Location: [facility];  Service: General;  Laterality: Right;   BREAST LUMPECTOMY Right 02/05/2020   Procedure: RIGHT BREAST CENTRAL LUMPECTOMY;  Surgeon: Griselda Miner, MD;  Location: [facility];  Service: General;  Laterality: Right;   BREAST RECONSTRUCTION WITH PLACEMENT OF TISSUE EXPANDER AND FLEX HD (ACELLULAR HYDRATED DERMIS) Right 01/20/2021   Procedure: BREAST RECONSTRUCTION WITH PLACEMENT OF TISSUE EXPANDER AND FLEX HD (ACELLULAR HYDRATED DERMIS);  Surgeon: Peggye Form, DO;  Location: [facility] OR;  Service: Plastics;  Laterality: Right;  2 hours   CARPAL TUNNEL RELEASE Right 2006   COLONOSCOPY     DILATION AND CURETTAGE OF UTERUS  1970s X 2-3   ELECTROPHYSIOLOGIC STUDY  1994 X 2;2001   "to see it it was sustained VT; cause thyroid levels were causing arrhythmias"   EYE SURGERY  12/2020   cataracts   HYSTEROTOMY  1994   ICD IMPLANT N/A 12/28/2022   Procedure: ICD IMPLANT;  Surgeon: [name], MD;  Location: [facility] INVASIVE CV LAB;  Service: Cardiovascular;  Laterality: N/A;   LAPAROSCOPIC CHOLECYSTECTOMY  2004   MASTECTOMY Bilateral 1992   "subcutaneous"   MELANOMA EXCISION Left 2001   "ankle, stage I"   PLACEMENT OF BREAST IMPLANTS Bilateral 1992   [id number]   PVC ABLATION N/A 12/23/2022   Procedure: PVC ABLATION;  Surgeon: Regan Lemming, MD;  Location: [facility];  Service: Cardiovascular;  Laterality: N/A;   REMOVAL OF TISSUE EXPANDER AND PLACEMENT OF IMPLANT Right 05/05/2021   Procedure: REMOVAL OF TISSUE EXPANDER AND PLACEMENT OF IMPLANT;  Surgeon: Peggye Form, DO;  Location: [facility];  Service: Plastics;  Laterality: Right;   RIGHT/LEFT HEART CATH AND CORONARY ANGIOGRAPHY N/A 11/11/2023   Procedure: RIGHT/LEFT HEART  CATH AND CORONARY ANGIOGRAPHY;  Surgeon: Kathleene Hazel, MD;  Location: [facility];  Service: Cardiovascular;  Laterality: N/A;   TOTAL THYROIDECTOMY  12/1999   "cancer"   TRANSESOPHAGEAL ECHOCARDIOGRAM (CATH LAB) N/A 10/06/2023   Procedure: TRANSESOPHAGEAL ECHOCARDIOGRAM;  Surgeon: Tessa Lerner, DO;  Location: [facility];  Service: Cardiovascular;  Laterality: N/A;   VENTRICULAR ABLATION SURGERY  2011   ventricular tchycardia   Social History:   reports that she has never smoked. She has never been exposed to tobacco smoke. She has never used smokeless tobacco. She reports that she does not drink alcohol and does not use drugs.   Family History:  Her family history includes  Arrhythmia in her brother; Basal cell carcinoma (age of onset: 2) in her mother; Breast cancer in an other family member; Breast cancer (age of onset: 64) in her maternal grandmother; Cancer in her maternal aunt; Heart attack in her brother; Heart disease in her brother; Heart disease (age of onset: 28) in her father; Hyperlipidemia (age of onset: 39) in her mother; Hypertension in her brother; Leukemia in her maternal aunt; Lung cancer (age of onset: 24) in her maternal aunt; Osteoporosis in her mother; Other in her brother, father, and paternal aunt; Squamous cell carcinoma (age of onset: 60) in her brother. There is no history of Thyroid cancer.   Allergies: Allergies  Allergen Reactions   Quinidine Palpitations    REACTION: increased heart rate   Statins     Muscle pain   Tape     If left on long enough,will tear skin    Amoxicillin Itching and Rash   Atenolol Hives   Dofetilide Other (See Comments)    Elevated QTCs   Mexiletine Hcl Palpitations    Dizzy and extreme tiredness   Nadolol Hives   Vicodin  [Hydrocodone-Acetaminophen] Swelling    Facial redness and swelling   Home Medications: Prior to Admission medications   Medication Sig Start Date End Date Taking? Authorizing  Provider  acetaminophen (TYLENOL) 500 MG tablet Take 1,000 mg by mouth every 6 (six) hours as needed for mild pain or moderate pain.   Yes [provider]  ALPRAZolam Prudy Feeler) 0.5 MG tablet Take 1 tablet (0.5 mg total) by mouth at bedtime as needed for sleep Patient taking differently: Take 0.25 mg by mouth at bedtime. 08/12/23  Yes   apixaban (ELIQUIS) 5 MG TABS tablet Take 1 tablet (5 mg total) by mouth 2 (two) times daily. 10/29/23  Yes [name], MD  Ascorbic Acid (VITAMIN C) 1000 MG tablet Take 1,000 mg by mouth daily.   Yes [provider]  B Complex-C (SUPER B COMPLEX PO) Take 1 capsule by mouth at bedtime.   Yes [provider]  beta carotene 16109 UNIT capsule Take 10,000 Units by mouth at bedtime.   Yes [provider]  Biotin 5 MG CAPS Take 5 mg by mouth at bedtime.    Yes [provider]  Calcium 600-200 MG-UNIT tablet Take 1 tablet by mouth daily.   Yes [provider]  cetirizine (ZYRTEC) 10 MG tablet Take 10 mg by mouth at bedtime.   Yes [provider]  Cholecalciferol (VITAMIN D) 50 MCG (2000 UT) CAPS Take 2,000 Units by mouth daily.   Yes [provider]  Coenzyme Q10 (COQ-10) 100 MG CAPS Take 100 mg by mouth daily.   Yes [provider]  Digestive Enzyme CAPS Take 1 capsule by mouth daily as needed (heartburn).   Yes [provider]  escitalopram (LEXAPRO) 20 MG tablet Take 1 tablet (20 mg total) by mouth daily. 12/23/22  Yes   ezetimibe (ZETIA) 10 MG tablet Take 1 tablet (10 mg total) by mouth daily. 08/12/23  Yes   famotidine (PEPCID) 20 MG tablet Take 20 mg by mouth as needed for heartburn or indigestion.   Yes [provider]  fluticasone (FLONASE) 50 MCG/ACT nasal spray Place 1 spray into both nostrils daily as needed for allergies. 01/31/18  Yes [provider]  furosemide (LASIX) 20 MG tablet Take 20 mg by mouth daily as needed for fluid (1-2lb weight gain  overnight).   Yes [provider]  hydrocortisone cream  1 % Apply 1 Application topically daily as needed for itching.   Yes [provider]  MAGNESIUM MALATE PO Take 1,000 mg by mouth daily.   Yes [provider]  MANGANESE PO Take 40 mg by mouth at bedtime.   Yes [provider]  Melatonin 10 MG TABS Take 10 mg by mouth at bedtime.    Yes [provider]  Menatetrenone (VITAMIN K2) 100 MCG TABS Take 100 mcg by mouth daily.   Yes [provider]  methocarbamol (ROBAXIN) 500 MG tablet Take 1 tablet (500 mg total) by mouth every 8 (eight) hours as needed for neck pain 09/29/23  Yes   omeprazole (PRILOSEC) 40 MG capsule Take 1 capsule (40 mg total) by mouth every morning 30 minutes before breakfast 12/06/23  Yes   Polyvinyl Alcohol (LIQUID TEARS OP) Place 1 drop into both eyes daily as needed (dry eyes).   Yes [provider]  sotalol (BETAPACE) 160 MG tablet Take 1 tablet (160 mg total) by mouth 2 (two) times daily. 10/05/23  Yes Camnitz, Andree Coss, MD  thyroid (ARMOUR) 120 MG tablet Take 120 mg by mouth daily before breakfast.   Yes [provider]  triamcinolone (KENALOG) 0.025 % cream Apply 1 Application topically 2 (two) times daily as needed (rash).   Yes [provider]  zinc gluconate 50 MG tablet Take 50 mg by mouth daily.   Yes [provider]  potassium chloride SA (KLOR-CON M) 20 MEQ tablet Take 1 tablet (20 mEq total) by mouth 2 (two) times daily. Patient taking differently: Take 20 mEq by mouth daily as needed (Low potassium). 12/29/22   Sherie Don, NP  Simethicone 125 MG CAPS Take 250 mg by mouth daily as needed (bloating).    [provider]    Critical care time: 81   Lenard Galloway [facility] 12/08/23 9:47 AM  Please see [email or web address] for pager details.  From 7A-7P if no response, please call [phone] After hours, please call ELink [phone]

## 2023-12-08 NOTE — Progress Notes (Signed)
[address]       [place] [address]             [phone]      1 Day Post-Op  Procedure(s) (LRB): MITRAL VALVE REPAIR USING SIMULUS SEMI-RIGID ANNULOPLASTY BAND SIZE (N/A) TRANSESOPHAGEAL ECHOCARDIOGRAM (TEE) (N/A)   Total Length of Stay:  LOS: 1 day    SUBJECTIVE: Finally extubated last night Feels well  Vitals:   12/08/23 0645 12/08/23 0700  BP:    Pulse: 85 81  Resp: (!) 24 (!) 21  Temp: 99.1 F (37.3 C) 99.1 F (37.3 C)  SpO2: 100% 99%    Intake/Output      02/11 0701 02/12 0700 02/12 0701 02/13 0700   I.V. (mL/kg) 2921.8 (40.4)    Blood 224    IV Piggyback 1945.7    Total Intake(mL/kg) 5091.6 (70.4)    Urine (mL/kg/hr) 1720 (1)    Blood 410    Chest Tube 390    Total Output 2520    Net +2571.6             sodium chloride     sodium chloride     sodium chloride 10 mL/hr at 12/08/23 0700   albumin human Stopped (12/08/23 0011)    ceFAZolin (ANCEF) IV Stopped (12/08/23 0555)   dexmedetomidine (PRECEDEX) IV infusion Stopped (12/07/23 1332)   lactated ringers     lactated ringers 20 mL/hr at 12/08/23 0700   nitroGLYCERIN     phenylephrine (NEO-SYNEPHRINE) Adult infusion 10 mcg/min (12/08/23 0700)    CBC    Component Value Date/Time   WBC 8.3 12/08/2023 0400   RBC 2.55 (L) 12/08/2023 0400   HGB 8.2 (L) 12/08/2023 0400   HGB 13.9 11/08/2023 1640   HCT 24.5 (L) 12/08/2023 0400   HCT 42.1 11/08/2023 1640   PLT 100 (L) 12/08/2023 0400   PLT 216 11/08/2023 1640   MCV 96.1 12/08/2023 0400   MCV 94 11/08/2023 1640   MCH 32.2 12/08/2023 0400   MCHC 33.5 12/08/2023 0400   RDW 13.5 12/08/2023 0400   RDW 12.7 11/08/2023 1640   LYMPHSABS 1.7 11/08/2023 1640   MONOABS 0.4 08/06/2020 1316   EOSABS 0.3 11/08/2023 1640   BASOSABS 0.0 11/08/2023 1640   CMP     Component Value Date/Time   NA 140 12/08/2023 0400   NA 141 11/08/2023 1640   K 3.6 12/08/2023 0400   CL 101 12/08/2023 0400   CO2 23 12/08/2023 0400    GLUCOSE 114 (H) 12/08/2023 0400   BUN 10 12/08/2023 0400   BUN 23 11/08/2023 1640   CREATININE 0.55 12/08/2023 0400   CREATININE 0.77 08/06/2020 1316   CALCIUM 8.9 12/08/2023 0400   PROT 7.0 12/03/2023 1134   ALBUMIN 3.7 12/03/2023 1134   AST 27 12/03/2023 1134   AST 18 08/06/2020 1316   ALT 19 12/03/2023 1134   ALT 16 08/06/2020 1316   ALKPHOS 71 12/03/2023 1134   BILITOT 0.6 12/03/2023 1134   BILITOT 0.3 08/06/2020 1316   GFRNONAA >60 12/08/2023 0400   GFRNONAA >60 08/06/2020 1316   GFRAA 86 01/10/2020 1159   ABG    Component Value Date/Time   PHART 7.350 12/08/2023 0201   PCO2ART 44.7 12/08/2023 0201   PO2ART 146 (H) 12/08/2023 0201   HCO3 24.7 12/08/2023 0201   TCO2 26 12/08/2023 0201   ACIDBASEDEF 1.0 12/08/2023 0201   O2SAT 99 12/08/2023 0201   CBG (last 3)  Recent Labs  12/07/23 1930 12/07/23 2300 12/08/23 0402  GLUCAP 161* 121* 111*  EXAM Lungs: clear Card: rr with no murmur Ext: warm Neuro: intact   ASSESSMENT: POD #1 sp mv repair Doing well. Will dc swan and aline and pacing wires Diuresis today Anemia: expected post op will follow OOB to chair   Eugenio Hoes, MD 12/08/2023

## 2023-12-08 NOTE — Plan of Care (Signed)

## 2023-12-09 ENCOUNTER — Inpatient Hospital Stay (HOSPITAL_COMMUNITY): Payer: PPO

## 2023-12-09 DIAGNOSIS — D62 Acute posthemorrhagic anemia: Secondary | ICD-10-CM

## 2023-12-09 LAB — CBC
HCT: 22 % — ABNORMAL LOW (ref 36.0–46.0)
Hemoglobin: 7.2 g/dL — ABNORMAL LOW (ref 12.0–15.0)
MCH: 31.9 pg (ref 26.0–34.0)
MCHC: 32.7 g/dL (ref 30.0–36.0)
MCV: 97.3 fL (ref 80.0–100.0)
Platelets: 70 10*3/uL — ABNORMAL LOW (ref 150–400)
RBC: 2.26 MIL/uL — ABNORMAL LOW (ref 3.87–5.11)
RDW: 13.6 % (ref 11.5–15.5)
WBC: 7.5 10*3/uL (ref 4.0–10.5)
nRBC: 0 % (ref 0.0–0.2)

## 2023-12-09 LAB — GLUCOSE, CAPILLARY
Glucose-Capillary: 89 mg/dL (ref 70–99)
Glucose-Capillary: 122 mg/dL — ABNORMAL HIGH (ref 70–99)
Glucose-Capillary: 131 mg/dL — ABNORMAL HIGH (ref 70–99)
Glucose-Capillary: 128 mg/dL — ABNORMAL HIGH (ref 70–99)

## 2023-12-09 LAB — BASIC METABOLIC PANEL
Anion gap: 5 (ref 5–15)
BUN: 11 mg/dL (ref 8–23)
CO2: 26 mmol/L (ref 22–32)
Calcium: 8.6 mg/dL — ABNORMAL LOW (ref 8.9–10.3)
Chloride: 103 mmol/L (ref 98–111)
Creatinine, Ser: 0.76 mg/dL (ref 0.44–1.00)
GFR, Estimated: >60 mL/min (ref >60)
Glucose, Bld: 92 mg/dL (ref 70–99)
Potassium: 4.2 mmol/L (ref 3.5–5.1)
Sodium: 134 mmol/L — ABNORMAL LOW (ref 135–145)

## 2023-12-09 LAB — EXPECTORATED SPUTUM ASSESSMENT W GRAM STAIN, RFLX TO RESP C

## 2023-12-09 LAB — MAGNESIUM: Magnesium: 2.1 mg/dL (ref 1.7–2.4)

## 2023-12-09 LAB — PROCALCITONIN: Procalcitonin: 0.23 ng/mL

## 2023-12-09 MED ORDER — FUROSEMIDE 10 MG/ML IJ SOLN
40.0000 mg | Freq: Once | INTRAMUSCULAR | Status: AC
  Administered 2023-12-09: 40 mg via INTRAVENOUS
  Filled 2023-12-09: qty 4

## 2023-12-09 MED ORDER — POTASSIUM CHLORIDE CRYS ER 20 MEQ PO TBCR
40.0000 meq | EXTENDED_RELEASE_TABLET | Freq: Once | ORAL | Status: AC
  Administered 2023-12-09: 40 meq via ORAL
  Filled 2023-12-09: qty 2

## 2023-12-09 MED ORDER — BISACODYL 10 MG RE SUPP
10.0000 mg | Freq: Every day | RECTAL | Status: DC | PRN
  Administered 2023-12-09: 10 mg via RECTAL
  Filled 2023-12-09: qty 1

## 2023-12-09 NOTE — Discharge Summary (Signed)
[address]       [place] [address]             [phone]    Physician Discharge Summary  Patient ID: Patricia Alvarado MRN: [id number] DOB/AGE: [birth year] 78 y.o.  Admit date: 12/07/2023 Discharge date: 12/13/2023  Admission Diagnoses:Severer mitral regurgitation  Patient Active Problem List   Diagnosis Date Noted   S/P MVR (mitral valve repair) 12/07/2023   Mitral valve disease 10/06/2023   ICD (implantable cardioverter-defibrillator) in place 04/06/2023   Secondary hypercoagulable state (HCC) 09/22/2021   Acquired absence of right breast 11/05/2020   S/P mastectomy, right 11/05/2020   Hypertension 06/20/2020   Pre-procedure lab exam 03/05/2020   Family history of breast cancer    Family history of nonmelanoma skin cancer    Family history of leukemia    Family history of kidney cancer    Malignant neoplasm of central portion of right breast in female, estrogen receptor positive (HCC) 02/15/2020   Breast mass, right 02/05/2020   Afib (HCC) 05/26/2016   Paroxysmal atrial fibrillation (HCC) 05/26/2016   PVC (premature ventricular contraction) 11/27/2015   MELANOMA, LEG, LEFT 12/02/2009   Malignant neoplasm of skin of parts of face 12/02/2009   CARCINOMA, THYROID GLAND, PAPILLARY 12/02/2009   POSTSURGICAL HYPOTHYROIDISM 12/02/2009   DEPRESSION 12/02/2009   FIBROCYSTIC BREAST DISEASE 12/02/2009   FIBROMYALGIA 12/02/2009   Malignant neoplasm of skin of face 12/02/2009   Hyperlipidemia 08/29/2009   VT (ventricular tachycardia) (HCC) 08/29/2009     Discharge Diagnoses:  Patient Active Problem List   Diagnosis Date Noted   S/P MVR (mitral valve repair) 12/07/2023   Mitral valve disease 10/06/2023   ICD (implantable cardioverter-defibrillator) in place 04/06/2023   Secondary hypercoagulable state (HCC) 09/22/2021   Acquired absence of right breast 11/05/2020   S/P mastectomy, right 11/05/2020   Hypertension 06/20/2020   Pre-procedure lab exam  03/05/2020   Family history of breast cancer    Family history of nonmelanoma skin cancer    Family history of leukemia    Family history of kidney cancer    Malignant neoplasm of central portion of right breast in female, estrogen receptor positive (HCC) 02/15/2020   Breast mass, right 02/05/2020   Afib (HCC) 05/26/2016   Paroxysmal atrial fibrillation (HCC) 05/26/2016   PVC (premature ventricular contraction) 11/27/2015   MELANOMA, LEG, LEFT 12/02/2009   Malignant neoplasm of skin of parts of face 12/02/2009   CARCINOMA, THYROID GLAND, PAPILLARY 12/02/2009   POSTSURGICAL HYPOTHYROIDISM 12/02/2009   DEPRESSION 12/02/2009   FIBROCYSTIC BREAST DISEASE 12/02/2009   FIBROMYALGIA 12/02/2009   Malignant neoplasm of skin of face 12/02/2009   Hyperlipidemia 08/29/2009   VT (ventricular tachycardia) (HCC) 08/29/2009     Discharged Condition: good   Patricia Maw, MD Gaye Alken A, NP   History of Present Illness:     Patricia Alvarado is a 78 year old female with a past medical history of hypertension, hyperlipidemia, hypothyroidism, h/o breast cancer and radiation, paroxysmal atrial fibrillation, s/p ICD for VT, and MR. She has had a complicated cardiac history over the past several years. The patient was found to have atrial fibrillation in 2022 and underwent ablation procedure with success, but has had a life long issue with PVCs. This worsened to the point of requiring attempted Ventricular ablation, that was not felt possible and ended up with an ICD. She is still on Eliquis and has more irregular beats at night but overall is in NSR.  She was noted to have mitral regurgitation which has been mild for many years. 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 She has annular disjunction. She was felt best served with MV surgery and Cath was performed without CAD. She has no PHTN. Of note  patient had a bilateral mastectomy preventively in 1992 but developed nipple cancer and had resection and radiation therapy in 2022 with now radiation injury to her back skin and right lung. She feels this may also be contributing to her SOB.  Dr. Leafy Ro reviewed the patient's diagnostic studies and determined she would benefit from surgical intervention. He reviewed the treatment options with the patient as well as the risks and benefits of surgery. Patricia Alvarado was agreeable to proceed with surgery.  Hospital Course: Patricia Alvarado presented to the hospital and was brought to the operating room on 02/11. She underwent mitral valve repair utilizing a 34mm simulus semi rigid annuloplasty band. She tolerated the procedure well and was transferred to the SICU in stable condition.   Postoperative hospital course:  Patient was extubated using standard post cardiac surgical protocols without difficulty.  He has remained hemodynamically stable.  Swan-Ganz and arterial line were removed on postop day 1 as were pacer wires.  She does have an expected acute blood loss anemia which is being followed clinically.  She is diuresed appropriately for expected volume overload not related to congestive heart failure and clinically not significant.  Chest tubes were removed on postop day #2.  She does have a reactive thrombocytopenia which improved with time.  She was placed on routine amiodarone prophylaxis per Dr. Karolee Ohs protocol.  She was felt stable for transfer to the progressive care unit on 12/09/2023.  She developed blood loss anemia which she was symptomatic from.  Her hemoglobin level dropped to 6.5 requiring transfusion of 1 unit of packed cells.  She was started on iron therapy.  Repeat H/H was 8.3/24.6.  He is continuing to rise appropriately.  Her platelet count recovered without further intervention. She was routinely diuresed. She was restarted on home Eliquis 5mg  BID for history of afib and PVCs with ICD in  place. She was anxious about not having much help at home, home health aid was arranged. She was ambulating well and her incisions were healing well without sign of infection.  Oxygen was weaned and she maintains good saturations on room air.  She was felt stable for discharge home.   Consults: pulmonary/intensive care  Significant Diagnostic Studies:  DG Chest Port 1 View Result Date: 12/09/2023 CLINICAL DATA:  Status post mitral valve repair. EXAM: PORTABLE CHEST 1 VIEW COMPARISON:  Chest x-ray from yesterday. FINDINGS: Interval removal of the Swan-Ganz catheter. Right internal jugular sheath remains in place. Unchanged mediastinal drains. Unchanged left chest wall pacemaker. Stable cardiomediastinal silhouette mitral valve repair. Unchanged dense retrocardiac left lower lobe airspace opacity, likely atelectasis. Unchanged small bilateral pleural effusions. No pneumothorax. No acute osseous abnormality. IMPRESSION: 1. Interval removal of the Swan-Ganz catheter. 2. Unchanged small bilateral pleural effusions and left lower lobe atelectasis. Electronically Signed   By: Obie Dredge M.D.   On: 12/09/2023 10:09   DG Chest Port 1 View Result Date: 12/08/2023 CLINICAL DATA:  Status post mitral valve repair EXAM: PORTABLE CHEST 1 VIEW COMPARISON:  12/07/2023 FINDINGS: Cardiac shadow is stable. Postsurgical changes are again noted. Swan-Ganz catheter and defibrillator are again seen.  Pericardial and mediastinal drains are noted. Mild left basilar atelectasis is again seen. No definitive pneumothorax is identified. IMPRESSION: Left basilar atelectasis. Tubes and lines as described. Electronically Signed   By: Alcide Clever M.D.   On: 12/08/2023 09:48   DG Chest Port 1 View Result Date: 12/07/2023 CLINICAL DATA:  Postop mitral valve repair. EXAM: PORTABLE CHEST 1 VIEW COMPARISON:  Radiographs 12/03/2023 and 12/29/2022. Chest CT 12/03/2023. FINDINGS: 1144 hours. Interval median sternotomy and mitral valve  repair. Left subclavian AICD leads appear unchanged, projecting over the right atrium and right ventricle. Tip of the endotracheal tube overlies the mid trachea. Enteric tube projects below the diaphragm. A right IJ Swan-Ganz catheter projects into the proximal aspect of the right pulmonary artery. Mediastinal drains are in place. The heart size and mediastinal contours are stable. New small left pleural effusion with probable left lower lobe atelectasis. Tiny left apical pneumothorax. The right lung appears clear. No acute osseous findings are evident. There are surgical clips at the thoracic inlet and postsurgical changes from previous lower cervical fusion. IMPRESSION: 1. Interval median sternotomy and mitral valve repair with support system as described. 2. New small left pleural effusion with probable left lower lobe atelectasis. Tiny left apical pneumothorax. Electronically Signed   By: Carey Bullocks M.D.   On: 12/07/2023 13:57   ECHO INTRAOPERATIVE TEE Result Date: 12/07/2023  *INTRAOPERATIVE TRANSESOPHAGEAL REPORT *  Patient Name:   [facility] Date of Exam: 12/07/2023 Medical Rec #:  [id number]       Height:       64.5 in Accession #:    [id number]      Weight:       146.6 lb Date of Birth:  [birth year]       BSA:          1.72 m Patient Age:    77 years        BP:           163/58 mmHg Patient Gender: F               HR:           74 bpm. Exam Location:  Anesthesiology Transesophogeal exam was perform intraoperatively during surgical procedure. Patient was closely monitored under general anesthesia during the entirety of examination. Indications:     Mitral valve disease Performing Phys: Anice Paganini, DO Complications: No known complications during this procedure. POST-OP IMPRESSIONS _ Left Ventricle: The left ventriclar function is normal following separation from cardio-pulmonary bypass. _ Right Ventricle: The right ventriclar function is normal following separation from cardio-pulmonary bypass. _  Aorta: The aorta appears unchanged from pre-bypass. There is no dissection. _ Left Atrial Appendage: The left atrial appendage appears unchanged from pre-bypass. _ Aortic Valve: The aortic valve appears unchanged from pre-bypass. Mild to moderate AI. _ Mitral Valve: There is an annuloplasty ring in the mitral position. There is no mitral regurgitation. There is no mitral stenosis. Mean PG is 2 mmHg. There is no evidence of SAM. _ Tricuspid Valve: The tricuspid valve appears unchanged from pre-bypass. There is mild Tricspid regurgitation. _ Pulmonic Valve: The pulmonic valve appears unchanged from pre-bypass. Trace pulmonic insufficiency. _ Interatrial Septum: The interatrial septum appears unchanged from pre-bypass. No evidence of interatrial shunt. _ Pericardium: There is no pericardial collection noted following chest closure. PRE-OP FINDINGS  Left Ventricle: The left ventricle has normal systolic function, with an ejection fraction of 55-60%. The cavity size was normal. There is no left ventricular hypertrophy.  Right Ventricle: The right ventricle has normal systolic function. The cavity was normal. There is no increase in right ventricular wall thickness. Left Atrium: No left atrial/left atrial appendage thrombus was detected. Interatrial Septum: No atrial level shunt detected by color flow Doppler. There is no evidence of a patent foramen ovale. Pericardium: There is no evidence of pericardial effusion. There is no pleural effusion. Mitral Valve: The mitral valve is degenerative. There is thickening of the posterior leaflet with prolapse of the P1 and P2 segments. There are multiple prominant clefts noted in the posterior leaflet with multiple regurgitation jets. Severe mitral regurgitation by color flow doppler. There is no mitral stenosis. C-sept 2.78, aortomitral angle 126 deg. Tricuspid Valve: The tricuspid valve was normal in structure. Tricuspid valve regurgitation is mild by color flow Doppler and  primarily visualized around the pulmonary artery catheter. Tricuspid annulus is 3.69 during mid diastole. Aortic Valve: The aortic valve is tricuspid. The leaflets open well with no evidence of restricted motion. There is mild to moderate central aortic valve regurgitation color flow Doppler. VC 0.341. The jet area is 26% of the LVOT. Pressure half time consistantly greater than 500 under varying intraoperative loading conditions. There is no holodiastalic flow reversal in the descending aorta. Pulmonic Valve: The pulmonic valve was normal in structure. Pulmonic valve regurgitation is trivial by color flow Doppler. Aorta: The aortic root and ascending aorta are normal in size and structure.  Anice Paganini Electronically signed by Anice Paganini Signature Date/Time: 12/07/2023/10:31:49 AM    Final    DG Chest 2 View Result Date: 12/04/2023 CLINICAL DATA:  Preop chest exam.  Mitral valve disease. EXAM: CHEST - 2 VIEW COMPARISON:  Radiographs 12/29/2022, chest CT 09/05/2021 FINDINGS: Dual lead left-sided pacemaker with lead tips projecting over the right atrium and ventricle. The cardiomediastinal contours are normal. Coarse calcification projects over the ventricular apex. The lungs are clear. Pulmonary vasculature is normal. No consolidation, pleural effusion, or pneumothorax. No acute osseous abnormalities are seen. IMPRESSION: No active cardiopulmonary disease. Electronically Signed   By: Narda Rutherford M.D.   On: 12/04/2023 10:00   VAS US CAROTID Result Date: 12/03/2023 Carotid Arterial Duplex Study Patient Name:  [facility]  Date of Exam:   12/03/2023 Medical Rec #: [id number]        Accession #:    [id number] Date of Birth: July 11, 1946        Patient Gender: F Patient Age:   48 years Exam Location:  [facility] Procedure:      VAS US CAROTID Referring Phys: Eugenio Hoes --------------------------------------------------------------------------------  Indications:       Mitral regurgitation. Risk Factors:       Hypertension. Comparison Study:  None. Performing Technologist: Shona Simpson  Examination Guidelines: A complete evaluation includes B-mode imaging, spectral Doppler, color Doppler, and power Doppler as needed of all accessible portions of each vessel. Bilateral testing is considered an integral part of a complete examination. Limited examinations for reoccurring indications may be performed as noted.  Right Carotid Findings: +----------+--------+--------+--------+------------------+------------------+           PSV cm/sEDV cm/sStenosisPlaque DescriptionComments           +----------+--------+--------+--------+------------------+------------------+ CCA Prox  86      16                                intimal thickening +----------+--------+--------+--------+------------------+------------------+ CCA Distal66      16  intimal thickening +----------+--------+--------+--------+------------------+------------------+ ICA Prox  60      17                                intimal thickening +----------+--------+--------+--------+------------------+------------------+ ICA Distal80      23                                                   +----------+--------+--------+--------+------------------+------------------+ ECA       93      13                                                   +----------+--------+--------+--------+------------------+------------------+ +----------+--------+-------+--------+-------------------+           PSV cm/sEDV cmsDescribeArm Pressure (mmHG) +----------+--------+-------+--------+-------------------+ Subclavian145     0                                  +----------+--------+-------+--------+-------------------+ +---------+--------+--+--------+--+ VertebralPSV cm/s61EDV cm/s13 +---------+--------+--+--------+--+  Left Carotid Findings:  +----------+--------+--------+--------+------------------+------------------+           PSV cm/sEDV cm/sStenosisPlaque DescriptionComments           +----------+--------+--------+--------+------------------+------------------+ CCA Prox  99      14                                intimal thickening +----------+--------+--------+--------+------------------+------------------+ CCA Distal70      17                                intimal thickening +----------+--------+--------+--------+------------------+------------------+ ICA Prox  62      12      1-39%   hypoechoic                           +----------+--------+--------+--------+------------------+------------------+ ICA Distal91      22                                                   +----------+--------+--------+--------+------------------+------------------+ ECA       91      10                                                   +----------+--------+--------+--------+------------------+------------------+ +----------+--------+--------+--------+-------------------+           PSV cm/sEDV cm/sDescribeArm Pressure (mmHG) +----------+--------+--------+--------+-------------------+ Subclavian174     0                                   +----------+--------+--------+--------+-------------------+ +---------+--------+--+--------+--+ VertebralPSV cm/s61EDV cm/s13 +---------+--------+--+--------+--+   Summary: Right Carotid: The extracranial vessels were near-normal with only minimal wall  thickening or plaque. Left Carotid: Velocities in the left ICA are consistent with a 1-39% stenosis. Vertebrals:  Bilateral vertebral arteries demonstrate antegrade flow. Subclavians: Normal flow hemodynamics were seen in bilateral subclavian              arteries. *See table(s) above for measurements and observations.  Electronically signed by Gerarda Fraction on 12/03/2023 at 4:51:09 PM.    Final      Results for  orders placed or performed during the hospital encounter of 12/07/23 (from the past 48 hours)  CBC     Status: Abnormal   Collection Time: 12/13/23  2:57 AM  Result Value Ref Range   WBC 5.4 4.0 - 10.5 K/uL   RBC 2.99 (L) 3.87 - 5.11 MIL/uL   Hemoglobin 9.3 (L) 12.0 - 15.0 g/dL   HCT 16.1 (L) 09.6 - 04.5 %   MCV 94.3 80.0 - 100.0 fL   MCH 31.1 26.0 - 34.0 pg   MCHC 33.0 30.0 - 36.0 g/dL   RDW 40.9 81.1 - 91.4 %   Platelets 181 150 - 400 K/uL   nRBC 0.0 0.0 - 0.2 %    Comment: Performed at [facility], [address] [place], [place] [address]  Basic metabolic panel     Status: Abnormal   Collection Time: 12/13/23  2:57 AM  Result Value Ref Range   Sodium 139 135 - 145 mmol/L   Potassium 3.5 3.5 - 5.1 mmol/L   Chloride 102 98 - 111 mmol/L   CO2 29 22 - 32 mmol/L   Glucose, Bld 93 70 - 99 mg/dL    Comment: Glucose reference range applies only to samples taken after fasting for at least 8 hours.   BUN 7 (L) 8 - 23 mg/dL   Creatinine, Ser 6.21 0.44 - 1.00 mg/dL   Calcium 9.0 8.9 - 30.8 mg/dL   GFR, Estimated >65 >78 mL/min    Comment: (NOTE) Calculated using the CKD-EPI Creatinine Equation (2021)    Anion gap 8 5 - 15    Comment: Performed at [facility], [address] [place], [place] [address]     Treatments: surgery:  CARDIOVASCULAR SURGERY OPERATIVE NOTE   12/07/2023 [name] [id number]   Surgeon:  Ashley Akin, MD   First Assistant: Aloha Gell [facility]                                   Preoperative Diagnosis:  Severe Mitral Regurgitation   Postoperative Diagnosis:  Same     Procedure: Mitral Valve Ring annulplasty with a 34 mm Simulus Semi Rigid Band   Anesthesia:  General Endotracheal    Discharge Exam: Blood pressure 133/63, pulse 70, temperature 98.6 F (37 C), temperature source Oral, resp. rate 19, height 5' 4.5" (1.638 m), weight 68 kg, SpO2 95%.    General appearance: alert, cooperative, and no distress Heart: regular rate  and rhythm and occas extrasystole Lungs: clear to auscultation bilaterally Abdomen: benign Extremities: trace edema Wound: incis healing well  Discharge Medications:  The patient has been discharged on:   1.Beta Blocker:  Yes [  y ]                              No   [   ]  If No, reason:  2.Ace Inhibitor/ARB: Yes [   ]                                     No  [ n   ]                                     If No, reason:not hypertensive or in heart failure  3.Statin:   Yes [   ]                  No  [   ]                  If No, reason:on Zezia, not a CAD patient  4.Ecasa:  Yes  [ y ]                  No   [   ]                  If No, reason:  Patient had ACS upon admission:n  Plavix/P2Y12 inhibitor: Yes [   ]                                      No  [ n  ], on eliquis for afib     Discharge Instructions     Amb Referral to Cardiac Rehabilitation   Complete by: As directed    Diagnosis: Valve Repair   Valve: Mitral   After initial evaluation and assessments completed: Virtual Based Care may be provided alone or in conjunction with Phase 2 Cardiac Rehab based on patient barriers.: Yes   Intensive Cardiac Rehabilitation (ICR) [facility] location only OR Traditional Cardiac Rehabilitation (TCR) *If criteria for ICR are not met will enroll in TCR [facility] only): Yes   Discharge patient   Complete by: As directed    Discharge disposition: 01-Home or Self Care   Discharge patient date: 12/13/2023      Allergies as of 12/13/2023       Reactions   Quinidine Palpitations   REACTION: increased heart rate   Statins    Muscle pain   Tape    If left on long enough,will tear skin    Amoxicillin Itching, Rash   Atenolol Hives   Dofetilide Other (See Comments)   Elevated QTCs   Mexiletine Hcl Palpitations   Dizzy and extreme tiredness   Nadolol Hives   Vicodin  [hydrocodone-acetaminophen] Swelling   Facial redness and swelling        Medication List      TAKE these medications    acetaminophen 500 MG tablet Commonly known as: TYLENOL Take 1,000 mg by mouth every 6 (six) hours as needed for mild pain or moderate pain.   ALPRAZolam 0.5 MG tablet Commonly known as: XANAX Take 1 tablet (0.5 mg total) by mouth at bedtime as needed for sleep What changed:  how much to take when to take this   aspirin EC 81 MG tablet Take 1 tablet (81 mg total) by mouth daily. Swallow whole.   beta carotene 16109 UNIT capsule Take 10,000 Units by mouth at bedtime.   Biotin 5 MG Caps Take 5 mg by mouth at bedtime.  Calcium 600-200 MG-UNIT tablet Take 1 tablet by mouth daily.   cetirizine 10 MG tablet Commonly known as: ZYRTEC Take 10 mg by mouth at bedtime.   CoQ-10 100 MG Caps Take 100 mg by mouth daily.   Digestive Enzyme Caps Take 1 capsule by mouth daily as needed (heartburn).   Eliquis 5 MG Tabs tablet Generic drug: apixaban Take 1 tablet (5 mg total) by mouth 2 (two) times daily.   escitalopram 20 MG tablet Commonly known as: Lexapro Take 1 tablet (20 mg total) by mouth daily.   ezetimibe 10 MG tablet Commonly known as: ZETIA Take 1 tablet (10 mg total) by mouth daily.   famotidine 20 MG tablet Commonly known as: PEPCID Take 20 mg by mouth as needed for heartburn or indigestion.   ferrous sulfate 325 (65 FE) MG tablet Take 1 tablet (325 mg total) by mouth 2 (two) times daily with a meal.   fluticasone 50 MCG/ACT nasal spray Commonly known as: FLONASE Place 1 spray into both nostrils daily as needed for allergies.   furosemide 40 MG tablet Commonly known as: LASIX Take 1 tablet (40 mg total) by mouth daily. What changed:  medication strength how much to take when to take this reasons to take this   hydrocortisone cream 1 % Apply 1 Application topically daily as needed for itching.   LIQUID TEARS OP Place 1 drop into both eyes daily as needed (dry eyes).   MAGNESIUM MALATE PO Take 1,000 mg by mouth  daily.   MANGANESE PO Take 40 mg by mouth at bedtime.   Melatonin 10 MG Tabs Take 10 mg by mouth at bedtime.   methocarbamol 500 MG tablet Commonly known as: ROBAXIN Take 1 tablet (500 mg total) by mouth every 8 (eight) hours as needed for neck pain   omeprazole 40 MG capsule Commonly known as: PRILOSEC Take 1 capsule (40 mg total) by mouth every morning 30 minutes before breakfast   oxyCODONE 5 MG immediate release tablet Commonly known as: Oxy IR/ROXICODONE Take 1 tablet (5 mg total) by mouth every 6 (six) hours as needed for up to 7 days for severe pain (pain score 7-10).   potassium chloride SA 20 MEQ tablet Commonly known as: KLOR-CON M Take 1 tablet (20 mEq total) by mouth daily. What changed: when to take this   Simethicone 125 MG Caps Take 250 mg by mouth daily as needed (bloating).   sotalol 160 MG tablet Commonly known as: BETAPACE Take 1 tablet (160 mg total) by mouth 2 (two) times daily.   SUPER B COMPLEX PO Take 1 capsule by mouth at bedtime.   thyroid 120 MG tablet Commonly known as: ARMOUR Take 120 mg by mouth daily before breakfast.   triamcinolone 0.025 % cream Commonly known as: KENALOG Apply 1 Application topically 2 (two) times daily as needed (rash).   vitamin C 1000 MG tablet Take 1,000 mg by mouth daily.   Vitamin D 50 MCG (2000 UT) Caps Take 2,000 Units by mouth daily.   Vitamin K2 100 MCG Tabs Take 100 mcg by mouth daily.   zinc gluconate 50 MG tablet Take 50 mg by mouth daily.               Durable Medical Equipment  (From admission, onward)           Start     Ordered   12/10/23 1517  For home use only DME 4 wheeled rolling walker with seat  Once  Question Answer Comment  Patient needs a walker to treat with the following condition Mitral valve replaced   Patient needs a walker to treat with the following condition Physical deconditioning      12/10/23 1516            Follow-up Information      Eugenio Hoes, MD Follow up.   Specialty: Cardiothoracic Surgery Why: Please see discharge paperwork for details of follow-up with your surgeons office. Contact information: [address] [place] [address] [phone]         Patricia Maw, MD Follow up.   Specialty: Cardiology Why: See discharge paperwork for details of follow-up appointment with cardiology.  Your first visit may be with a physician assistant or nurse practitioner.  Please also obtain a MYCHART ACCOUNT to keep track of all future appointments with different providers. Contact information: [address] [place] [address] [phone]         [name], NP Follow up.   Specialty: Internal Medicine Why: please call your PCP to schedule hospital follow up in 1-2 weeks. Contact information: [address] [place] [address] [phone]         [facility] Follow up.   Why: Home Health Agency will call to arrange appts Contact information: [phone]                Signed:  Rowe Clack, PA-C  12/13/2023, 9:05 AM

## 2023-12-09 NOTE — Telephone Encounter (Signed)
Addressed in My Chart message

## 2023-12-09 NOTE — Progress Notes (Signed)
Monitors applied, CCMD notified.  VSS stable and charted.  Pt oriented to room and plan of care reviewed. All needs met and all question answered.

## 2023-12-09 NOTE — Progress Notes (Signed)
NAME:  Patricia Alvarado, MRN:  [id number], DOB:  [birth year], LOS: 2 ADMISSION DATE:  12/07/2023 CONSULTATION DATE:  12/07/2023 REFERRING MD:  Leafy Ro - TCTS, CHIEF COMPLAINT:  Postoperative vent management s/p MVR   History of Present Illness:  Patricia Alvarado is seen in consultation at the request of Dr. Leafy Ro (TCTS) for management of mechanical ventilation and hemodynamics post-MVR.  78 year old woman who presented to [facility] 2/11 for scheduled MVR. PMHx significant for HTN, HLD, PAF, VT (s/p AICD), MVP with severe MR, hypothyroidism, GERD, migraines, fibromyalgia, anxiety/depression, skin CA (basal cell carcinoma, malignant melanoma).   Underwent MV repair/annuloplasty with Dr. Leafy Ro 2/11. Intraoperative course was uncomplicated with normal BiV function and no further MR noted at the end of the case. EBL . Required Neosynephrine throughout the case.  PCCM consulted for postoperative ventilator management.  Pertinent Medical History:   Past Medical History:  Diagnosis Date   A-fib [facility])    AICD (automatic cardioverter/defibrillator) present    Medtronic. Managed by Dr. Lewayne Bunting   Anxiety    Arthritis    thumb   Basal cell carcinoma 2001   "forehead, between eyebrows" right arm (2022)   Breast cancer (HCC)    Carcinoma of thyroid gland (HCC) 2001   Chronic lower back pain    "worse is across my hips" (05/26/2016)   Depression    Dyslipidemia    Dyspnea    Dysrhythmia    A. Fib   Family history of breast cancer    Family history of kidney cancer    Family history of leukemia    Family history of nonmelanoma skin cancer    Fibrocystic breast    Fibromyalgia    GERD (gastroesophageal reflux disease)    Heart murmur    History of blood transfusion 1992   "after subcutaneous mastectomies"   History of hiatal hernia    Hypertension    not on any medications   Hypothyroidism    Malignant melanoma of left ankle (HCC) 2001   Migraine    "visual; 2-3 times/year" (05/26/2016)    Mitral valve prolapse    PONV (postoperative nausea and vomiting)    Presence of permanent cardiac pacemaker    Medtronic   Ventricular tachycardia (HCC)    Hx of, controlled on sotalol therapy   Significant Hospital Events: Including procedures, antibiotic start and stop dates in addition to other pertinent events   2/11 - MVR. CCM consulted for MV/hemodynamics. 2/12 - extubated overnight, AICD turned back on yesterday. Swan, a-line and pacers out today.   Interim History / Subjective:  Complaining of mildly productive cough.  Sputum sample at bedside.  Tmax 99.5  Objective:  Blood pressure (!) 111/44, pulse 65, temperature 99.5 F (37.5 C), resp. rate 16, height 5' 4.5" (1.638 m), weight 72.3 kg, SpO2 95%. PAP: (20-37)/(3-17) 37/4      Intake/Output Summary (Last 24 hours) at 12/09/2023 0865 Last data filed at 12/09/2023 0600 Gross per 24 hour  Intake 807.28 ml  Output 2140 ml  Net -1332.72 ml   Filed Weights   12/07/23 1226 12/08/23 0500 12/09/23 0500  Weight: 66.5 kg 72.3 kg 72.3 kg   Physical Examination: General:  critically ill appearing woman lying in the recliner in NAD HEENT: [place]/AT, eyes anicteric Neuro: Awake, alert, answering questions appropriately, moving extremities CV: S1-S2, RRR, intermittently pacing.  No murmur PULM: Comfortably nasal cannula, clear anteriorly.  Decreased basilar breath sounds.   GI: Soft, nontender, nondistended Extremities: Still has mild pretibial edema, no  cyanosis Skin: Warm, dry, no diffuse rashes  CXR personally reviewed> left hemidiaphragm silhouetted, mild cephalization Na+  134 BUN 11 Cr 0.76 WBC 7.5 H/H 7.2/22 Platelets 70  Chest tube output 170cc   Resolved Hospital Problem List:  Post-operative vent management   Assessment & Plan:  MVP with severe MR; S/p MVR  - con't amiodarone prophylaxis -sotalol -aspirin, intolerant to statin due to myalgias -complete post op antibiotics -has indwelling pacer/  AICD -Pain control per protocol -advance diet and mobility -post-op care per TCTS; chest tubes per TCTS  PAF VT s/p AICD, followed by Dr. Ladona Ridgel -sotalol -amiodarone -monitor electrolytes, replete as needed - holding PTA DOAC  Cough with sputum production -check PCT, sputum culture  ABLA, expected post op Consumptive thrombocytopenia, expected post-op -monitor, hold chemical DVT prophylaxis  Hyperglycemia -SSI PRN- minimal insulin requirements  HTN HLD -aspirin, zetia, intolerant to statins due to myalgias -not on PTA antihypertensives  Hypothyroidism - con't armour 120mg  daily   GERD, esophageal dysphagia chronically - con't PPI   Migraines Fibromyalgia Anxiety Depression --con't PTA lexapro and xanax PRN -mobility, sleep hygiene -advance mobility as tolerated  Best Practice: (right click and "Reselect all SmartList Selections" daily)   Diet/type: Regular consistency (see orders) DVT prophylaxis: SCDs GI prophylaxis: PPI Lines: Central line Foley:  Yes, and it is still needed Code Status:  full code Last date of multidisciplinary goals of care discussion [Per TCTS]  Labs:  CBC: Recent Labs  Lab 12/07/23 1106 12/07/23 1108 12/07/23 1730 12/07/23 1818 12/07/23 2224 12/08/23 0201 12/08/23 0400 12/08/23 1558 12/09/23 0340  WBC 7.5  --  7.7  --   --   --  8.3 9.6 7.5  HGB 8.3*   < > 8.2*   < > 8.2* 7.8* 8.2* 7.7* 7.2*  HCT 24.7*   < > 25.0*   < > 24.0* 23.0* 24.5* 23.8* 22.0*  MCV 94.6  --  95.4  --   --   --  96.1 97.1 97.3  PLT 94*  --  107*  --   --   --  100* 91* 70*   < > = values in this interval not displayed.   Basic Metabolic Panel: Recent Labs  Lab 12/03/23 1134 12/07/23 0751 12/07/23 1002 12/07/23 1006 12/07/23 1730 12/07/23 1818 12/07/23 2224 12/08/23 0201 12/08/23 0400 12/08/23 1558 12/09/23 0340  NA 139   < > 139   < > 139   < > 141 139 140 135 134*  K 4.1   < > 4.4   < > 4.1   < > 3.7 3.8 3.6 4.1 4.2  CL 103   < > 105   --  108  --   --   --  101 104 103  CO2 27  --   --   --  22  --   --   --  23 26 26   GLUCOSE 91   < > 124*  --  142*  --   --   --  114* 143* 92  BUN 20   < > 22  --  15  --   --   --  10 10 11   CREATININE 0.69   < > 0.40*  --  0.58  --   --   --  0.57  0.55 0.61 0.76  CALCIUM 9.8  --   --   --  7.6*  --   --   --  8.9 8.1* 8.6*  MG  --   --   --   --  3.2*  --   --   --  2.5* 2.2 2.1   < > = values in this interval not displayed.       Critical care time:     Steffanie Dunn, DO 12/09/23 7:19 AM [facility]  For contact information, see Amion. If no response to pager, please call PCCM consult pager. After hours, 7PM- 7AM, please call Elink.

## 2023-12-09 NOTE — Progress Notes (Signed)
Pt arrived to unit approx 15 mins ago and went straight into bathroom.  At this time Pt requesting suppository.  RN explained to Pt that meds are not available until she is checked in to the room.  Pt wishes to remain the bathroom.  Will follow up as able

## 2023-12-09 NOTE — Plan of Care (Signed)

## 2023-12-09 NOTE — Progress Notes (Signed)
[address]       [place] [address]             [phone]      2 Days Post-Op  Procedure(s) (LRB): MITRAL VALVE REPAIR USING SIMULUS SEMI-RIGID ANNULOPLASTY BAND SIZE (N/A) TRANSESOPHAGEAL ECHOCARDIOGRAM (TEE) (N/A)   Total Length of Stay:  LOS: 2 days    SUBJECTIVE: Walked this am Still with discomfort  Vitals:   12/09/23 0600 12/09/23 0700  BP: (!) 111/44 101/89  Pulse: 65 66  Resp: 16 17  Temp: 99.5 F (37.5 C) 97.6 F (36.4 C)  SpO2: 95% 95%    Intake/Output      02/12 0701 02/13 0700 02/13 0701 02/14 0700   P.O. 440    I.V. (mL/kg) 167.4 (2.3)    Blood     IV Piggyback 199.9    Total Intake(mL/kg) 807.3 (11.2)    Urine (mL/kg/hr) 1970 (1.1)    Blood     Chest Tube 170    Total Output 2140    Net -1332.7             albumin human Stopped (12/08/23 0011)   nitroGLYCERIN      CBC    Component Value Date/Time   WBC 7.5 12/09/2023 0340   RBC 2.26 (L) 12/09/2023 0340   HGB 7.2 (L) 12/09/2023 0340   HGB 13.9 11/08/2023 1640   HCT 22.0 (L) 12/09/2023 0340   HCT 42.1 11/08/2023 1640   PLT 70 (L) 12/09/2023 0340   PLT 216 11/08/2023 1640   MCV 97.3 12/09/2023 0340   MCV 94 11/08/2023 1640   MCH 31.9 12/09/2023 0340   MCHC 32.7 12/09/2023 0340   RDW 13.6 12/09/2023 0340   RDW 12.7 11/08/2023 1640   LYMPHSABS 1.7 11/08/2023 1640   MONOABS 0.4 08/06/2020 1316   EOSABS 0.3 11/08/2023 1640   BASOSABS 0.0 11/08/2023 1640   CMP     Component Value Date/Time   NA 134 (L) 12/09/2023 0340   NA 141 11/08/2023 1640   K 4.2 12/09/2023 0340   CL 103 12/09/2023 0340   CO2 26 12/09/2023 0340   GLUCOSE 92 12/09/2023 0340   BUN 11 12/09/2023 0340   BUN 23 11/08/2023 1640   CREATININE 0.76 12/09/2023 0340   CREATININE 0.77 08/06/2020 1316   CALCIUM 8.6 (L) 12/09/2023 0340   PROT 7.0 12/03/2023 1134   ALBUMIN 3.7 12/03/2023 1134   AST 27 12/03/2023 1134   AST 18 08/06/2020 1316   ALT 19 12/03/2023 1134   ALT 16  08/06/2020 1316   ALKPHOS 71 12/03/2023 1134   BILITOT 0.6 12/03/2023 1134   BILITOT 0.3 08/06/2020 1316   GFRNONAA >60 12/09/2023 0340   GFRNONAA >60 08/06/2020 1316   GFRAA 86 01/10/2020 1159   ABG    Component Value Date/Time   PHART 7.350 12/08/2023 0201   PCO2ART 44.7 12/08/2023 0201   PO2ART 146 (H) 12/08/2023 0201   HCO3 24.7 12/08/2023 0201   TCO2 26 12/08/2023 0201   ACIDBASEDEF 1.0 12/08/2023 0201   O2SAT 99 12/08/2023 0201   CBG (last 3)  Recent Labs    12/08/23 2357 12/09/23 0347 12/09/23 0721  GLUCAP 96 89 122*  Exam Lungs; decreased Left base Card: RR without murmur Ext: Warm Neuro: intact   ASSESSMENT: POD #2 SP MV repair Doing well DC chest tubes and ambulate Transfer to floor Thrombocytopenia: follow for now Continue coumadin   Eugenio Hoes, MD 12/09/2023

## 2023-12-09 NOTE — TOC Initial Note (Signed)
Transition of Care [facility]) - Initial/Assessment Note    Patient Details  Name: Patricia Alvarado MRN: [id number] Date of Birth: [birth year]  [facility]) CM/SW Contact:    Elliot Cousin, RN Phone Number: [phone] 12/09/2023, 5:26 PM  Clinical Narrative:                 TOC CM spoke to pt at bedside. Husband at home but son will come to assist both parents. Pt states she has RW and cane at home. Will continue to follow for dc needs.   Expected Discharge Plan: Home w Home Health Services Barriers to Discharge: Continued Medical Work up   Patient Goals and CMS Choice Patient states their goals for this hospitalization and ongoing recovery are:: wants to get back to her independence CMS Medicare.gov Compare Post Acute Care list provided to:: Patient Choice offered to / list presented to : Patient      Expected Discharge Plan and Services   Discharge Planning Services: CM Consult Post Acute Care Choice: Home Health Living arrangements for the past 2 months: Single Family Home                                      Prior Living Arrangements/Services Living arrangements for the past 2 months: Single Family Home Lives with:: Spouse Patient language and need for interpreter reviewed:: Yes Do you feel safe going back to the place where you live?: Yes      Need for Family Participation in Patient Care: Yes (Comment) Care giver support system in place?: Yes (comment) Current home services: DME (rolling walker, cane) Criminal Activity/Legal Involvement Pertinent to Current Situation/Hospitalization: No - Comment as needed  Activities of Daily Living   ADL Screening (condition at time of admission) Independently performs ADLs?: Yes (appropriate for developmental age) Is the patient deaf or have difficulty hearing?: No Does the patient have difficulty seeing, even when wearing glasses/contacts?: No Does the patient have difficulty concentrating, remembering,  or making decisions?: No  Permission Sought/Granted Permission sought to share information with : PCP, Case Manager, Family Supports Permission granted to share information with : Yes, Verbal Permission Granted  Share Information with NAME: Lizzie An  Permission granted to share info w AGENCY: Home Health  Permission granted to share info w Relationship: husband  Permission granted to share info w Contact Information: [phone]  Emotional Assessment Appearance:: Appears older than stated age Attitude/Demeanor/Rapport: Engaged Affect (typically observed): Accepting Orientation: : Oriented to Self, Oriented to Place, Oriented to  Time, Oriented to Situation   Psych Involvement: No (comment)  Admission diagnosis:  S/P MVR (mitral valve repair) [Z98.890] Patient Active Problem List   Diagnosis Date Noted   S/P MVR (mitral valve repair) 12/07/2023   Mitral valve disease 10/06/2023   ICD (implantable cardioverter-defibrillator) in place 04/06/2023   Secondary hypercoagulable state (HCC) 09/22/2021   Acquired absence of right breast 11/05/2020   S/P mastectomy, right 11/05/2020   Hypertension 06/20/2020   Pre-procedure lab exam 03/05/2020   Family history of breast cancer    Family history of nonmelanoma skin cancer    Family history of leukemia    Family history of kidney cancer    Malignant neoplasm of central portion of right breast in female, estrogen receptor positive (HCC) 02/15/2020   Breast mass, right 02/05/2020   Afib (HCC) 05/26/2016   Paroxysmal atrial fibrillation (HCC) 05/26/2016  PVC (premature ventricular contraction) 11/27/2015   MELANOMA, LEG, LEFT 12/02/2009   Malignant neoplasm of skin of parts of face 12/02/2009   CARCINOMA, THYROID GLAND, PAPILLARY 12/02/2009   POSTSURGICAL HYPOTHYROIDISM 12/02/2009   DEPRESSION 12/02/2009   FIBROCYSTIC BREAST DISEASE 12/02/2009   FIBROMYALGIA 12/02/2009   Malignant neoplasm of skin of face 12/02/2009    Hyperlipidemia 08/29/2009   VT (ventricular tachycardia) (HCC) 08/29/2009   PCP:  [name], NP Pharmacy:   [facility] [address] [place] [address] Phone: [phone] Fax: [phone]     Social Drivers of Health (SDOH) Social History: SDOH Screenings   Food Insecurity: No Food Insecurity (12/07/2023)  Housing: Low Risk  (12/07/2023)  Transportation Needs: No Transportation Needs (12/07/2023)  Utilities: Not At Risk (12/07/2023)  Social Connections: Socially Integrated (12/07/2023)  Tobacco Use: Low Risk  (12/07/2023)   SDOH Interventions:     Readmission Risk Interventions     No data to display

## 2023-12-10 ENCOUNTER — Telehealth: Payer: Self-pay | Admitting: Cardiology

## 2023-12-10 DIAGNOSIS — Z9889 Other specified postprocedural states: Secondary | ICD-10-CM

## 2023-12-10 LAB — BASIC METABOLIC PANEL
Anion gap: 7 (ref 5–15)
BUN: 11 mg/dL (ref 8–23)
CO2: 28 mmol/L (ref 22–32)
Calcium: 8.5 mg/dL — ABNORMAL LOW (ref 8.9–10.3)
Chloride: 102 mmol/L (ref 98–111)
Creatinine, Ser: 0.56 mg/dL (ref 0.44–1.00)
GFR, Estimated: >60 mL/min (ref >60)
Glucose, Bld: 100 mg/dL — ABNORMAL HIGH (ref 70–99)
Potassium: 4.3 mmol/L (ref 3.5–5.1)
Sodium: 137 mmol/L (ref 135–145)

## 2023-12-10 LAB — CBC
HCT: 20.5 % — ABNORMAL LOW (ref 36.0–46.0)
Hemoglobin: 6.8 g/dL — CL (ref 12.0–15.0)
MCH: 31.6 pg (ref 26.0–34.0)
MCHC: 33.2 g/dL (ref 30.0–36.0)
MCV: 95.3 fL (ref 80.0–100.0)
Platelets: 81 10*3/uL — ABNORMAL LOW (ref 150–400)
RBC: 2.15 MIL/uL — ABNORMAL LOW (ref 3.87–5.11)
RDW: 13.5 % (ref 11.5–15.5)
WBC: 5.8 10*3/uL (ref 4.0–10.5)
nRBC: 0 % (ref 0.0–0.2)

## 2023-12-10 LAB — PREPARE RBC (CROSSMATCH)

## 2023-12-10 LAB — GLUCOSE, CAPILLARY
Glucose-Capillary: 92 mg/dL (ref 70–99)
Glucose-Capillary: 106 mg/dL — ABNORMAL HIGH (ref 70–99)

## 2023-12-10 MED ORDER — FUROSEMIDE 40 MG PO TABS
40.0000 mg | ORAL_TABLET | Freq: Every day | ORAL | Status: DC
  Administered 2023-12-11 – 2023-12-13 (×3): 40 mg via ORAL
  Filled 2023-12-10 (×3): qty 1

## 2023-12-10 MED ORDER — POTASSIUM CHLORIDE CRYS ER 20 MEQ PO TBCR
20.0000 meq | EXTENDED_RELEASE_TABLET | Freq: Every day | ORAL | Status: DC
  Administered 2023-12-10 – 2023-12-12 (×3): 20 meq via ORAL
  Filled 2023-12-10 (×3): qty 1

## 2023-12-10 MED ORDER — FERROUS SULFATE 325 (65 FE) MG PO TABS
325.0000 mg | ORAL_TABLET | Freq: Two times a day (BID) | ORAL | Status: DC
  Administered 2023-12-10 – 2023-12-13 (×6): 325 mg via ORAL
  Filled 2023-12-10 (×6): qty 1

## 2023-12-10 MED ORDER — FUROSEMIDE 10 MG/ML IJ SOLN
20.0000 mg | Freq: Once | INTRAMUSCULAR | Status: AC
  Administered 2023-12-10: 20 mg via INTRAVENOUS
  Filled 2023-12-10: qty 2

## 2023-12-10 MED ORDER — SODIUM CHLORIDE 0.9% IV SOLUTION: Freq: Once | INTRAVENOUS | Status: AC

## 2023-12-10 NOTE — Telephone Encounter (Signed)
Ordering echo for s/p MVR. Results going to Dr. Leafy Ro and Rosemary Holms.

## 2023-12-10 NOTE — Progress Notes (Addendum)
      [address]       [place] [address]             [phone]      3 Days Post-Op Procedure(s) (LRB): MITRAL VALVE REPAIR USING SIMULUS SEMI-RIGID ANNULOPLASTY BAND SIZE (N/A) TRANSESOPHAGEAL ECHOCARDIOGRAM (TEE) (N/A)  Subjective:  Patient feeling a little run down.  She would like the dressings off her neck and wrist.  She denies hematuria and blood in her stool.  She did mention some BRB per rectum, after she required disimpaction  Objective: Vital signs in last 24 hours: Temp:  [97.9 F (36.6 C)-98.6 F (37 C)] 98.4 F (36.9 C) (02/14 0717) Pulse Rate:  [64-76] 73 (02/13 2008) Cardiac Rhythm: Normal sinus rhythm (02/13 2012) Resp:  [16-25] 21 (02/14 0642) BP: (101-120)/(42-86) 120/43 (02/14 0642) SpO2:  [92 %-99 %] 95 % (02/14 0717)  Intake/Output from previous day: 02/13 0701 - 02/14 0700 In: 220 [P.O.:120; IV Piggyback:100] Out: 1105 [Urine:1105]  General appearance: alert, cooperative, and no distress Heart: regular rate and rhythm Lungs: clear to auscultation bilaterally Abdomen: soft, non-tender; bowel sounds normal; no masses,  no organomegaly Extremities: edema trace Wound: clean and dry  Lab Results: Recent Labs    12/09/23 0340 12/10/23 0317  WBC 7.5 5.8  HGB 7.2* 6.8*  HCT 22.0* 20.5*  PLT 70* 81*   BMET:  Recent Labs    12/09/23 0340 12/10/23 0317  NA 134* 137  K 4.2 4.3  CL 103 102  CO2 26 28  GLUCOSE 92 100*  BUN 11 11  CREATININE 0.76 0.56  CALCIUM 8.6* 8.5*    PT/INR:  Recent Labs    12/07/23 1106  LABPROT 18.6*  INR 1.5*   ABG    Component Value Date/Time   PHART 7.350 12/08/2023 0201   HCO3 24.7 12/08/2023 0201   TCO2 26 12/08/2023 0201   ACIDBASEDEF 1.0 12/08/2023 0201   O2SAT 99 12/08/2023 0201   CBG (last 3)  Recent Labs    12/09/23 1547 12/09/23 2112 12/10/23 0604  GLUCAP 131* 128* 92    Assessment/Plan: S/P Procedure(s) (LRB): MITRAL VALVE REPAIR USING SIMULUS SEMI-RIGID  ANNULOPLASTY BAND SIZE (N/A) TRANSESOPHAGEAL ECHOCARDIOGRAM (TEE) (N/A)  CV- NSR, BP is stable 100-120s- on Sotalol Pulm- no acute issues, continue IS Renal- creatinine has remained stable, will require IV lasix after dose of packed cells Post operative blood loss anemia, Hgb is down to 6.5 patient is symptomatic, transfusing 1 unit as ordered by Dr. Cliffton Asters.. will start iron Post operative thrombocytopenia- improving, up to 80K CBGs controlled, patient is not a diabetic, will d/c SSIP Dispo- patient making progress, tx unit of packed cells today for symptomatic anemia, start iron... diurese   LOS: 3 days   [name], PA-C 12/10/2023   Chart reviewed, patient examined, agree with above.  Transfused this am for Hgb 6.5. Feels better. Wt is still 13 lbs over preop. Continue diuretic. Repeat CBC in am.

## 2023-12-10 NOTE — Progress Notes (Signed)
Date and time results received: 12/10/23 0445 (use smartphrase ".now" to insert current time)  Test: hgb Critical Value: 6.8  Name of Provider Notified: Dr. Cliffton Asters  Orders Received? Or Actions Taken?: order recieved

## 2023-12-10 NOTE — Progress Notes (Signed)
CARDIAC REHAB PHASE I   PRE:  Rate/Rhythm: 70 SR   BP:  Sitting: 118/44      SaO2: 96 RA   MODE:  Ambulation: 470 ft   POST:  Rate/Rhythm: 74 SR   BP:  Sitting: 139/56      SaO2: 96 RA   Pt ambulated in hallway using front wheel walker, moving at slow steady pace. Returned to bed with call bell and bedside table in reach. Post OHS education including site care, restrictions, heart healthy diet, sternal precautions, IS use at home, home needs at discharge, exercise guidelines and CRP2 reviewed. All questions and concerns addressed. Will refer to [place] for CRP2. Will continue to follow.   [id number] Woodroe Chen, RN BSN 12/10/2023 12:27 PM

## 2023-12-11 LAB — TYPE AND SCREEN
ABO/RH(D): A POS
Antibody Screen: NEGATIVE
Unit division: 0

## 2023-12-11 LAB — BPAM RBC
Blood Product Expiration Date: 202502212359
ISSUE DATE / TIME: 202502140654
Unit Type and Rh: 6200

## 2023-12-11 LAB — CBC
HCT: 24.6 % — ABNORMAL LOW (ref 36.0–46.0)
Hemoglobin: 8.3 g/dL — ABNORMAL LOW (ref 12.0–15.0)
MCH: 31.2 pg (ref 26.0–34.0)
MCHC: 33.7 g/dL (ref 30.0–36.0)
MCV: 92.5 fL (ref 80.0–100.0)
Platelets: 119 10*3/uL — ABNORMAL LOW (ref 150–400)
RBC: 2.66 MIL/uL — ABNORMAL LOW (ref 3.87–5.11)
RDW: 14.6 % (ref 11.5–15.5)
WBC: 5.9 10*3/uL (ref 4.0–10.5)
nRBC: 0 % (ref 0.0–0.2)

## 2023-12-11 LAB — GLUCOSE, CAPILLARY: Glucose-Capillary: 88 mg/dL (ref 70–99)

## 2023-12-11 LAB — CULTURE, RESPIRATORY W GRAM STAIN: Gram Stain: NONE SEEN

## 2023-12-11 MED ORDER — DOCUSATE SODIUM 100 MG PO CAPS
200.0000 mg | ORAL_CAPSULE | Freq: Every day | ORAL | Status: DC | PRN
  Administered 2023-12-11: 200 mg via ORAL
  Filled 2023-12-11: qty 2

## 2023-12-11 MED ORDER — BISACODYL 5 MG PO TBEC: 10.0000 mg | DELAYED_RELEASE_TABLET | Freq: Every day | ORAL | Status: DC | PRN

## 2023-12-11 NOTE — Progress Notes (Addendum)
      [address]       [place] [address]             [phone]      4 Days Post-Op Procedure(s) (LRB): MITRAL VALVE REPAIR USING SIMULUS SEMI-RIGID ANNULOPLASTY BAND SIZE (N/A) TRANSESOPHAGEAL ECHOCARDIOGRAM (TEE) (N/A) Subjective: No new complaints this AM. States she was able to eat more for dinner last night, nausea has resolved.   Objective: Vital signs in last 24 hours: Temp:  [98 F (36.7 C)-98.5 F (36.9 C)] 98.5 F (36.9 C) (02/15 0438) Pulse Rate:  [70-78] 71 (02/15 0438) Cardiac Rhythm: Normal sinus rhythm (02/14 2130) Resp:  [14-20] 18 (02/15 0438) BP: (125-139)/(54-61) 129/58 (02/15 0438) SpO2:  [95 %-96 %] 96 % (02/15 0438) Weight:  [70 kg] 70 kg (02/15 0438)  Hemodynamic parameters for last 24 hours:    Intake/Output from previous day: No intake/output data recorded. Intake/Output this shift: No intake/output data recorded.  General appearance: alert, cooperative, and no distress Neurologic: intact Heart: regular rate and rhythm with PVCs, no murmur Lungs: diminished left basilar breath sounds Abdomen: soft, non-tender; bowel sounds normal; no masses,  no organomegaly Extremities: edema 1+ Wound: Clean and dry without sign of infection  Lab Results: Recent Labs    12/10/23 0317 12/11/23 0306  WBC 5.8 5.9  HGB 6.8* 8.3*  HCT 20.5* 24.6*  PLT 81* 119*   BMET:  Recent Labs    12/09/23 0340 12/10/23 0317  NA 134* 137  K 4.2 4.3  CL 103 102  CO2 26 28  GLUCOSE 92 100*  BUN 11 11  CREATININE 0.76 0.56  CALCIUM 8.6* 8.5*    PT/INR: No results for input(s): "LABPROT", "INR" in the last 72 hours. ABG    Component Value Date/Time   PHART 7.350 12/08/2023 0201   HCO3 24.7 12/08/2023 0201   TCO2 26 12/08/2023 0201   ACIDBASEDEF 1.0 12/08/2023 0201   O2SAT 99 12/08/2023 0201   CBG (last 3)  Recent Labs    12/10/23 0604 12/10/23 2137 12/11/23 0636  GLUCAP 92 106* 88    Assessment/Plan: S/P Procedure(s)  (LRB): MITRAL VALVE REPAIR USING SIMULUS SEMI-RIGID ANNULOPLASTY BAND SIZE (N/A) TRANSESOPHAGEAL ECHOCARDIOGRAM (TEE) (N/A)  Neuro: Pain controlled  CV: NSR, HR 70s with PVCs. ICD in place, pacing appropriately. BP controlled on home Sotalol 160mg  BID. On Eliquis 5mg  BID at home for irregular heart beats...plan to restart at discharge  Pulm: Saturating well on RA. CXR with small bilateral pleural effusions and left basilar atelectasis. Encourage IS and ambulation  GI: +BM, diet improving.   Endo: CBGs 92/106/88, no DM. SSI and CBGs have been d/c'd. Hypothyroid on Armour 120mg  daily.   Renal: Last Cr 0.56. +8lbs from preop, down 5lbs from yesterday. Continue Lasix 40mg  daily.   Expected postop ABLA: Improved, H/H 8.3/24.6 after transfusion of 1U PRBCs yesterday. Reactive thrombocytopenia improving, plt 119,000. Continue iron supplement.   DVT Prophylaxis: Lovenox held due to thrombocytopenia  Dispo: Patient is very anxious about discharging home, states her husband will not be much help. Her son will be helping out but his whole family currently has the flu. Will speak with CSW about HH. Hopefully can d/c in the next 24-48 hours.    LOS: 4 days    Jenny Reichmann, PA-C 12/11/2023  Patient seen and examined, agree with findings and plan as outlined above  Viviann Spare C. Dorris Fetch, MD [facility] [phone]

## 2023-12-11 NOTE — Plan of Care (Signed)
  Problem: Health Behavior/Discharge Planning: Goal: Ability to manage health-related needs will improve 12/11/2023 0601 by Donnella Sham, RN Outcome: Progressing 12/11/2023 0105 by Donnella Sham, RN Outcome: Progressing   Problem: Clinical Measurements: Goal: Ability to maintain clinical measurements within normal limits will improve 12/11/2023 0601 by Donnella Sham, RN Outcome: Progressing 12/11/2023 0105 by Donnella Sham, RN Outcome: Progressing Goal: Will remain free from infection 12/11/2023 0601 by Donnella Sham, RN Outcome: Progressing 12/11/2023 0105 by Donnella Sham, RN Outcome: Progressing Goal: Diagnostic test results will improve 12/11/2023 0601 by Donnella Sham, RN Outcome: Progressing 12/11/2023 0105 by Donnella Sham, RN Outcome: Progressing Goal: Respiratory complications will improve 12/11/2023 0601 by Donnella Sham, RN Outcome: Progressing 12/11/2023 0105 by Donnella Sham, RN Outcome: Progressing Goal: Cardiovascular complication will be avoided 12/11/2023 0601 by Donnella Sham, RN Outcome: Progressing 12/11/2023 0105 by Donnella Sham, RN Outcome: Progressing   Problem: Nutrition: Goal: Adequate nutrition will be maintained 12/11/2023 0601 by Donnella Sham, RN Outcome: Progressing 12/11/2023 0105 by Donnella Sham, RN Outcome: Progressing   Problem: Safety: Goal: Ability to remain free from injury will improve 12/11/2023 0601 by Donnella Sham, RN Outcome: Progressing 12/11/2023 0105 by Donnella Sham, RN Outcome: Progressing   Problem: Skin Integrity: Goal: Risk for impaired skin integrity will decrease 12/11/2023 0601 by Donnella Sham, RN Outcome: Progressing 12/11/2023 0105 by Donnella Sham, RN Outcome: Progressing   Problem: Education: Goal: Will demonstrate proper wound care and an understanding of methods to prevent future damage 12/11/2023 0601 by Donnella Sham,  RN Outcome: Progressing 12/11/2023 0105 by Donnella Sham, RN Outcome: Progressing Goal: Knowledge of disease or condition will improve 12/11/2023 0601 by Donnella Sham, RN Outcome: Progressing 12/11/2023 0105 by Donnella Sham, RN Outcome: Progressing Goal: Knowledge of the prescribed therapeutic regimen will improve 12/11/2023 0601 by Donnella Sham, RN Outcome: Progressing 12/11/2023 0105 by Donnella Sham, RN Outcome: Progressing Goal: Individualized Educational Video(s) 12/11/2023 0601 by Donnella Sham, RN Outcome: Progressing 12/11/2023 0105 by Donnella Sham, RN Outcome: Progressing   Problem: Activity: Goal: Risk for activity intolerance will decrease 12/11/2023 0601 by Donnella Sham, RN Outcome: Progressing 12/11/2023 0105 by Donnella Sham, RN Outcome: Progressing   Problem: Cardiac: Goal: Will achieve and/or maintain hemodynamic stability 12/11/2023 0601 by Donnella Sham, RN Outcome: Progressing 12/11/2023 0105 by Donnella Sham, RN Outcome: Progressing   Problem: Clinical Measurements: Goal: Postoperative complications will be avoided or minimized 12/11/2023 0601 by Donnella Sham, RN Outcome: Progressing 12/11/2023 0105 by Donnella Sham, RN Outcome: Progressing   Problem: Respiratory: Goal: Respiratory status will improve 12/11/2023 0601 by Donnella Sham, RN Outcome: Progressing 12/11/2023 0105 by Donnella Sham, RN Outcome: Progressing   Problem: Skin Integrity: Goal: Wound healing without signs and symptoms of infection 12/11/2023 0601 by Donnella Sham, RN Outcome: Progressing 12/11/2023 0105 by Donnella Sham, RN Outcome: Progressing Goal: Risk for impaired skin integrity will decrease 12/11/2023 0601 by Donnella Sham, RN Outcome: Progressing 12/11/2023 0105 by Donnella Sham, RN Outcome: Progressing   Problem: Urinary Elimination: Goal: Ability to achieve and maintain  adequate renal perfusion and functioning will improve 12/11/2023 0601 by Donnella Sham, RN Outcome: Progressing 12/11/2023 0105 by Donnella Sham, RN Outcome: Progressing

## 2023-12-11 NOTE — Plan of Care (Signed)
  Problem: Health Behavior/Discharge Planning: Goal: Ability to manage health-related needs will improve Outcome: Progressing   Problem: Clinical Measurements: Goal: Ability to maintain clinical measurements within normal limits will improve Outcome: Progressing Goal: Will remain free from infection Outcome: Progressing Goal: Diagnostic test results will improve Outcome: Progressing Goal: Respiratory complications will improve Outcome: Progressing Goal: Cardiovascular complication will be avoided Outcome: Progressing   Problem: Nutrition: Goal: Adequate nutrition will be maintained Outcome: Progressing   Problem: Safety: Goal: Ability to remain free from injury will improve Outcome: Progressing   Problem: Skin Integrity: Goal: Risk for impaired skin integrity will decrease Outcome: Progressing   Problem: Education: Goal: Will demonstrate proper wound care and an understanding of methods to prevent future damage Outcome: Progressing Goal: Knowledge of disease or condition will improve Outcome: Progressing Goal: Knowledge of the prescribed therapeutic regimen will improve Outcome: Progressing Goal: Individualized Educational Video(s) Outcome: Progressing   Problem: Activity: Goal: Risk for activity intolerance will decrease Outcome: Progressing   Problem: Cardiac: Goal: Will achieve and/or maintain hemodynamic stability Outcome: Progressing   Problem: Clinical Measurements: Goal: Postoperative complications will be avoided or minimized Outcome: Progressing   Problem: Respiratory: Goal: Respiratory status will improve Outcome: Progressing   Problem: Skin Integrity: Goal: Wound healing without signs and symptoms of infection Outcome: Progressing Goal: Risk for impaired skin integrity will decrease Outcome: Progressing   Problem: Urinary Elimination: Goal: Ability to achieve and maintain adequate renal perfusion and functioning will improve Outcome:  Progressing

## 2023-12-11 NOTE — Progress Notes (Signed)
Mobility Specialist Progress Note:    12/11/23 1351  Mobility  Activity Ambulated with assistance in hallway  Level of Assistance Minimal assist, patient does 75% or more  Assistive Device Front wheel walker  Distance Ambulated (ft) 350 ft  RUE Weight Bearing Per Provider Order NWB  LUE Weight Bearing Per Provider Order NWB  Activity Response Tolerated well  Mobility Referral Yes  Mobility visit 1 Mobility  Mobility Specialist Start Time (ACUTE ONLY) 1200  Mobility Specialist Stop Time (ACUTE ONLY) 1213  Mobility Specialist Time Calculation (min) (ACUTE ONLY) 13 min   Pt received in bed agreeable to mobility. Required MinA w/ STS to maintain sternal precautions. Pt had to be reminded how to properly stand w/ said precautions. Was able to ambulate w/ stand by assist. Returned to room w/o fault. Call bell and personal belongings in reach. All needs met. Left in bed.  Thompson Grayer Mobility Specialist  Please contact vis Secure Chat or  Rehab Office [phone]

## 2023-12-11 NOTE — Progress Notes (Signed)
CARDIAC REHAB PHASE I   Pt resting in bed, feeling well this morning. Pt reports mobilizing well independently in room. Was able to wash up by sink and dress herself  without problems. Pt declines walk in hall for now due to fatigue related to busy morning. Pt reports she can not tolerate sitting in the reclining chair, stating it is very uncomfortable. Encouraged mobility later today and continued IS use. Postop OHS education completed yesterday. All questions and concerns addressed. Referral for CRP2 sent to [facility].   [id number]  Woodroe Chen, RN BSN 12/11/2023 12:15 PM

## 2023-12-12 MED ORDER — ASPIRIN 81 MG PO TBEC
81.0000 mg | DELAYED_RELEASE_TABLET | Freq: Every day | ORAL | Status: DC
  Administered 2023-12-13: 81 mg via ORAL
  Filled 2023-12-12 (×2): qty 1

## 2023-12-12 MED ORDER — APIXABAN 5 MG PO TABS
5.0000 mg | ORAL_TABLET | Freq: Two times a day (BID) | ORAL | Status: DC
  Administered 2023-12-12 – 2023-12-13 (×3): 5 mg via ORAL
  Filled 2023-12-12 (×3): qty 1

## 2023-12-12 NOTE — Progress Notes (Addendum)
[address]       [place] [id number]             [phone]      5 Days Post-Op Procedure(s) (LRB): MITRAL VALVE REPAIR USING SIMULUS SEMI-RIGID ANNULOPLASTY BAND SIZE (N/A) TRANSESOPHAGEAL ECHOCARDIOGRAM (TEE) (N/A) Subjective: Patient states she has more incisional pain this AM.   Objective: Vital signs in last 24 hours: Temp:  [97.7 F (36.5 C)-98.6 F (37 C)] 98.6 F (37 C) (02/16 0700) Pulse Rate:  [71-83] 83 (02/16 0700) Cardiac Rhythm: Normal sinus rhythm (02/15 2018) Resp:  [16-20] 16 (02/16 0700) BP: (111-150)/(50-73) 150/69 (02/16 0700) SpO2:  [95 %-97 %] 96 % (02/16 0700) Weight:  [68.5 kg] 68.5 kg (02/16 0700)  Hemodynamic parameters for last 24 hours:    Intake/Output from previous day: 02/15 0701 - 02/16 0700 In: 640 [P.O.:640] Out: -  Intake/Output this shift: No intake/output data recorded.  General appearance: alert, cooperative, and no distress Neurologic: intact Heart: regular rate and rhythm with PVCs Lungs: clear to auscultation bilaterally Abdomen: soft, non-tender; bowel sounds normal; no masses,  no organomegaly Extremities: edema trace-1+ Wound: Clean and dry without sign of infection  Lab Results: Recent Labs    12/10/23 0317 12/11/23 0306  WBC 5.8 5.9  HGB 6.8* 8.3*  HCT 20.5* 24.6*  PLT 81* 119*   BMET:  Recent Labs    12/10/23 0317  NA 137  K 4.3  CL 102  CO2 28  GLUCOSE 100*  BUN 11  CREATININE 0.56  CALCIUM 8.5*    PT/INR: No results for input(s): "LABPROT", "INR" in the last 72 hours. ABG    Component Value Date/Time   PHART 7.350 12/08/2023 0201   HCO3 24.7 12/08/2023 0201   TCO2 26 12/08/2023 0201   ACIDBASEDEF 1.0 12/08/2023 0201   O2SAT 99 12/08/2023 0201   CBG (last 3)  Recent Labs    12/10/23 0604 12/10/23 2137 12/11/23 0636  GLUCAP 92 106* 88    Assessment/Plan: S/P Procedure(s) (LRB): MITRAL VALVE REPAIR USING SIMULUS SEMI-RIGID ANNULOPLASTY BAND SIZE  (N/A) TRANSESOPHAGEAL ECHOCARDIOGRAM (TEE) (N/A)  Neuro: Patient with increased sternal pain this AM. Has not gotten any pain medication, only Tylenol.    CV: NSR, HR 70s with PVCs. ICD in place, pacing appropriately. BP overall controlled on home Sotalol 160mg  BID. On Eliquis 5mg  BID at home for irregular heart beats...plan to restart at discharge   Pulm: Saturating well on RA. CXR with small bilateral pleural effusions and left basilar atelectasis. Encourage IS and ambulation   GI: +BM, diet improving.    Endo: No DM. SSI and CBGs have been d/c'd. Hypothyroid on Armour 120mg  daily.    Renal: Last Cr 0.56. No UO recorded. +5lbs from preop, down 3lbs from yesterday. Continue Lasix 40mg  daily and K supplement.    Expected postop ABLA: Improved, H/H 8.3/24.6 yesterday after transfusion of 1U PRBCs. Reactive thrombocytopenia improving, plt 119,000. Continue iron supplement.    DVT Prophylaxis: Lovenox held due to thrombocytopenia   Dispo: Patient is very anxious about discharging home, states her husband will not be much help. Her son will be helping out but his whole family currently has the flu. CSW said that she can get a home health aid to assist her at home. D/C home today vs tomorrow   LOS: 5 days    Jenny Reichmann, PA-C 12/12/2023  Patient seen and examined, agree with findings and plan outlined above Plan dc  tomorrow to allow arrangements to be made  Viviann Spare C. Dorris Fetch, MD [facility] [phone]

## 2023-12-12 NOTE — Evaluation (Signed)
Occupational Therapy Evaluation Patient Details Name: Patricia Alvarado MRN: [id number] DOB: 09/16/1946 Today's Date: 12/12/2023   History of Present Illness   Pt is a 78 y.o. female presenting 12/07/23 s/p mitral valve repair. PMHx A-fib, breast cancer, depression, fibromyalgia, GERD, HTN, hypothyroidism, ventricular tachycardia, Dyslipidemia, permanent cardiac pacemaker     Clinical Impressions At baseline, pt is Independent with ADLs, IADLs, and functional mobility without an AD and drives. Pt now presents with decreased activity tolerance, decreased knowledge of precautions and compensatory strategies, mildly decreased dynamic balance, and decreased safety and independence with functional tasks. Pt currently demonstrates ability to complete UB ADLs Independent to Contact guard assist, LB ADLs with Set up to Supervision, and functional mobility/transfers with a RW with close Supervision for safety. Pt able to state sternal precautions this session Independently but requiring Min cues to adhere to precautions during functional tasks. Pt's VSS on RA throughout session. Pt will benefit from acute skilled OT services to address deficits outlined below and to increase safety and independence with functional tasks. Post acute discharge, pt will benefit from continued skilled OT services in the home to maximize rehab potential.      If plan is discharge home, recommend the following:   A little help with bathing/dressing/bathroom;Assistance with cooking/housework;Assist for transportation;Help with stairs or ramp for entrance     Functional Status Assessment   Patient has had a recent decline in their functional status and demonstrates the ability to make significant improvements in function in a reasonable and predictable amount of time.     Equipment Recommendations   None recommended by OT (pt already has needed equipment)     Recommendations for Other Services          Precautions/Restrictions   Precautions Precautions: Sternal;Fall Precaution Booklet Issued: Yes (comment) (Handout provided and reviewed with PT and verbally reviewed with OT) Recall of Precautions/Restrictions: Intact Restrictions Weight Bearing Restrictions Per Provider Order: Yes RUE Weight Bearing Per Provider Order: Non weight bearing LUE Weight Bearing Per Provider Order: Non weight bearing     Mobility Bed Mobility               General bed mobility comments: Pt sitting in recliner at beginning of session and sitting on toilet at end of session.    Transfers Overall transfer level: Modified independent Equipment used: None, Rolling walker (2 wheels) Transfers: Sit to/from Stand, Bed to chair/wheelchair/BSC Sit to Stand: Modified independent (Device/Increase time)     Step pivot transfers: Supervision            Balance Overall balance assessment: Needs assistance Sitting-balance support: No upper extremity supported, Feet supported Sitting balance-Leahy Scale: Normal     Standing balance support: No upper extremity supported, During functional activity, Single extremity supported, Bilateral upper extremity supported Standing balance-Leahy Scale: Good                             ADL either performed or assessed with clinical judgement   ADL Overall ADL's : Needs assistance/impaired Eating/Feeding: Independent;Sitting   Grooming: Supervision/safety;Standing;Cueing for compensatory techniques (adhering to sternal precautions)   Upper Body Bathing: Contact guard assist;Sitting;Cueing for compensatory techniques (adhering to sternal precautions)   Lower Body Bathing: Supervison/ safety;Sit to/from stand;Sitting/lateral leans (adhering to sternal precautions)   Upper Body Dressing : Contact guard assist;Sitting;Cueing for compensatory techniques (adhering to sternal precautions)   Lower Body Dressing: Supervision/safety;Sit to/from  stand;Sitting/lateral leans (adhering to sternal precautions)  Toilet Transfer: Supervision/safety;Ambulation;Rolling walker (2 wheels);Regular Toilet (adhering to sternal precautions)   Toileting- Clothing Manipulation and Hygiene: Set up;Cueing for compensatory techniques;Sit to/from stand;Sitting/lateral lean (adhering to sternal precautions)       Functional mobility during ADLs: Supervision/safety;Rolling walker (2 wheels) General ADL Comments: Pt with decreased activity tolerance during funcitonal tasks. OT educated pt in compensatory techniques for completing ADLs while adhering to sternal precaustions with pt verbalizing and demonstrating understanding through teach back with min cues needed. Also initiated training in energy conservation techniques. Pt will benefit from reinforcement of all training and energy conservation strategies handouts.     Vision Baseline Vision/History: 1 Wears glasses Ability to See in Adequate Light: 0 Adequate (with glasses) Patient Visual Report: No change from baseline       Perception         Praxis         Pertinent Vitals/Pain Pain Assessment Pain Assessment: Faces Faces Pain Scale: Hurts little more Pain Location: Sternum Pain Descriptors / Indicators: Grimacing, Sharp, Sore, Aching Pain Intervention(s): Limited activity within patient's tolerance, Monitored during session     Extremity/Trunk Assessment Upper Extremity Assessment Upper Extremity Assessment: Right hand dominant;Overall WFL for tasks assessed (tested within sternal precautions)   Lower Extremity Assessment Lower Extremity Assessment: Defer to PT evaluation   Cervical / Trunk Assessment Cervical / Trunk Assessment: Normal   Communication Communication Communication: No apparent difficulties   Cognition Arousal: Alert Behavior During Therapy: WFL for tasks assessed/performed Cognition: No apparent impairments             OT - Cognition Comments: AAOx4  and pleasant throughout session. Demonstrates good insight into deficits and good safety awareness.                 Following commands: Intact       Cueing  General Comments   Cueing Techniques: Verbal cues;Visual cues   VSS throughout session on RA    Exercises     Shoulder Instructions      Home Living Family/patient expects to be discharged to:: Private residence Living Arrangements: Spouse/significant other Available Help at Discharge: Family;Available 24 hours/day;Available PRN/intermittently (Son available 24/7 2/17-2/19; husband available PRN) Type of Home: House Home Access: Stairs to enter Entrance Stairs-Number of Steps: 1 Entrance Stairs-Rails: None Home Layout: One level     Bathroom Shower/Tub: Chief Strategy Officer: Handicapped height Bathroom Accessibility: Yes How Accessible: Accessible via walker Home Equipment: Standard Walker;BSC/3in1;Shower seat;Grab bars - toilet;Wheelchair - Geophysical data processor          Prior Functioning/Environment Prior Level of Function : Independent/Modified Independent;Driving             Mobility Comments: Independent without an AD ADLs Comments: Independent with ADLs and IADLs, Drives, but does not drive at night and avoids driving on highways as much as possible.    OT Problem List: Decreased strength;Decreased activity tolerance;Decreased knowledge of precautions   OT Treatment/Interventions: Self-care/ADL training;Energy conservation;DME and/or AE instruction;Therapeutic activities;Patient/family education      OT Goals(Current goals can be found in the care plan section)   Acute Rehab OT Goals Patient Stated Goal: to return home OT Goal Formulation: With patient Time For Goal Achievement: 12/26/23 Potential to Achieve Goals: Good ADL Goals Pt Will Perform Grooming: standing;with modified independence (adhering to sternal precautions) Pt Will Perform Upper Body Bathing: with  modified independence;sitting (adhering to sternal precautions) Pt Will Perform Lower Body Bathing: with modified independence;sitting/lateral leans;sit to/from stand (adhering to sternal precautions) Pt  Will Perform Upper Body Dressing: with modified independence;sitting (adhering to sternal precautions) Pt Will Transfer to Toilet: with modified independence;ambulating;regular height toilet (with least restrictive AD; adhering to sternal precautions) Additional ADL Goal #1: Patient will demonstrate ability to Independently state 4 energy conservation strategies to increase safety and independence with functional tasks.   OT Frequency:  Min 1X/week    Co-evaluation              AM-PAC OT "6 Clicks" Daily Activity     Outcome Measure Help from another person eating meals?: None Help from another person taking care of personal grooming?: A Little Help from another person toileting, which includes using toliet, bedpan, or urinal?: A Little Help from another person bathing (including washing, rinsing, drying)?: A Little Help from another person to put on and taking off regular upper body clothing?: A Little Help from another person to put on and taking off regular lower body clothing?: A Little 6 Click Score: 19   End of Session Equipment Utilized During Treatment: Rolling walker (2 wheels) Nurse Communication: Mobility status;Other (comment) (Pt left sitting on toilet per pt request)  Activity Tolerance: Patient tolerated treatment well Patient left: Other (comment) (sitting on toilet)  OT Visit Diagnosis: Other abnormalities of gait and mobility (R26.89);Muscle weakness (generalized) (M62.81);Other (comment) (decreased activity tolerance)                Time: 7829-5621 OT Time Calculation (min): 37 min Charges:  OT General Charges $OT Visit: 1 Visit OT Evaluation $OT Eval Low Complexity: 1 Low OT Treatments $Self Care/Home Management : 8-22 mins  Jacquelinne Speak "Orson Eva., OTR/L,  MA Acute Rehab [phone]  Lendon Colonel 12/12/2023, 5:49 PM

## 2023-12-12 NOTE — Evaluation (Signed)
Physical Therapy Evaluation Patient Details Name: Patricia Alvarado MRN: [id number] DOB: Jun 13, 1946 Today's Date: 12/12/2023  History of Present Illness  Pt is a 78 y.o. female presenting 12/07/23 s/p mitral valve repair. PMHx A-fib, breast cancer, depression, fibromyalgia, GERD, HTN, hypothyroidism, ventricular tachycardia, Dyslipidemia, permanent cardiac pacemaker  Clinical Impression    Pt is a 78 y.o. female presenting on 12/07/23 with above conditions and deficits below, see PT Problem List. PTA pt lived independently with her spouse in a one story home with 1 STE and drove PRN. Currently, pt is mod I for bed mobility and transfers and CGA (no AD) -supervision (with RW) for ambulation. Pt displayed deficits in activity tolerance, functional mobility, dynamic balance, and gait and would benefit from acute PT to address these impairments. Additionally, s/t pt's PLOF, available assistance at home, current progression, and expected progression, pt would benefit from HHPT upon d/c. Pt education and handout regarding sternal precautions provided. Pt and family responded with verbal understanding. Will continue to follow acutely.       If plan is discharge home, recommend the following: Assistance with cooking/housework;Assist for transportation;A little help with walking and/or transfers   Can travel by private vehicle        Equipment Recommendations Rolling walker (2 wheels)  Recommendations for Other Services       Functional Status Assessment Patient has had a recent decline in their functional status and demonstrates the ability to make significant improvements in function in a reasonable and predictable amount of time.     Precautions / Restrictions Precautions Precautions: Sternal;Fall Precaution Booklet Issued: Yes (comment) (Pt given sternal precaution handout. Handout was reviewed with pt. All questions answered.) Recall of Precautions/Restrictions: Intact Restrictions Weight  Bearing Restrictions Per Provider Order: Yes RUE Weight Bearing Per Provider Order: Non weight bearing LUE Weight Bearing Per Provider Order: Non weight bearing      Mobility  Bed Mobility Overal bed mobility: Needs Assistance, Modified Independent Bed Mobility: Rolling, Sidelying to Sit Rolling: Modified independent (Device/Increase time), Used rails Sidelying to sit: HOB elevated, Used rails, Supervision       General bed mobility comments: Pt supervision for cueing for elbow placement and proper sequencing    Transfers Overall transfer level: Modified independent Equipment used: None, Rolling walker (2 wheels) Transfers: Sit to/from Stand Sit to Stand: Modified independent (Device/Increase time)           General transfer comment: Pt stands while maintaining sternal precautions, requires slightly increased fwd trunk flexion during initation. Mod I for increased time and cueing for arm placement when sitting down    Ambulation/Gait Ambulation/Gait assistance: Supervision, Contact guard assist Gait Distance (Feet): 300 Feet Assistive device: Rolling walker (2 wheels), None Gait Pattern/deviations: Step-through pattern, Decreased stride length, Decreased dorsiflexion - left, Shuffle Gait velocity: reduced Gait velocity interpretation: >2.62 ft/sec, indicative of community ambulatory (1.31-2.62 without RW)      Stairs            Wheelchair Mobility     Tilt Bed    Modified Rankin (Stroke Patients Only)       Balance Overall balance assessment: Needs assistance Sitting-balance support: No upper extremity supported, Feet supported Sitting balance-Leahy Scale: Normal Sitting balance - Comments: Pt sits EOB and in recliner without UE support or postural directional preferences. No LOB   Standing balance support: No upper extremity supported, During functional activity, Bilateral upper extremity supported Standing balance-Leahy Scale: Good Standing balance  comment: When standing with RW, pt has increased fwd  trunk posture than without RW. No LOB         Rhomberg - Eyes Opened: 30 (30 secs both firm and compliant surfaces. No postural sway noted) Rhomberg - Eyes Closed: 30 (30 secs both firm and compliant surfaces. Pt displays more sway with firm surface) High level balance activites: Turns, Sudden stops, Head turns               Pertinent Vitals/Pain Pain Assessment Pain Assessment: Faces Faces Pain Scale: Hurts little more Pain Location: Sternum Pain Descriptors / Indicators: Grimacing, Sharp, Sore Pain Intervention(s): Monitored during session    Home Living Family/patient expects to be discharged to:: Private residence (Simultaneous filing. User may not have seen previous data.) Living Arrangements: Spouse/significant other (Simultaneous filing. User may not have seen previous data.) Available Help at Discharge: Family;Available 24 hours/day;Available PRN/intermittently (Son available 24/7 2/17-2/19; husband available PRN) Type of Home: House (Simultaneous filing. User may not have seen previous data.) Home Access: Stairs to enter (Simultaneous filing. User may not have seen previous data.) Entrance Stairs-Rails: None Entrance Stairs-Number of Steps: 1 (Simultaneous filing. User may not have seen previous data.)   Home Layout: One level (Simultaneous filing. User may not have seen previous data.) Home Equipment: Standard Walker;BSC/3in1;Shower seat;Grab bars - toilet;Wheelchair - Geophysical data processor (Designer, industrial/product. User may not have seen previous data.)      Prior Function Prior Level of Function : Independent/Modified Independent;Driving (driving prn  Simultaneous filing. User may not have seen previous data.)             Mobility Comments: Independent mobility. No AD ADLs Comments: Independent with ADLs. PRN driving     Extremity/Trunk Assessment   Upper Extremity Assessment Upper Extremity Assessment:  Defer to OT evaluation    Lower Extremity Assessment Lower Extremity Assessment: Overall WFL for tasks assessed (pt reported heaviness in Bil legs s/t being in bed)    Cervical / Trunk Assessment Cervical / Trunk Assessment: Normal  Communication   Communication Communication: No apparent difficulties    Cognition Arousal: Alert Behavior During Therapy: WFL for tasks assessed/performed   PT - Cognitive impairments: No apparent impairments                         Following commands: Intact       Cueing Cueing Techniques: Verbal cues, Visual cues     General Comments General comments (skin integrity, edema, etc.): VSS throughout session on RA    Exercises     Assessment/Plan    PT Assessment Patient needs continued PT services  PT Problem List Decreased strength;Decreased range of motion;Decreased activity tolerance;Decreased balance;Decreased mobility;Cardiopulmonary status limiting activity;Pain       PT Treatment Interventions DME instruction;Gait training;Stair training;Functional mobility training;Therapeutic activities;Therapeutic exercise;Balance training;Neuromuscular re-education;Patient/family education    PT Goals (Current goals can be found in the Care Plan section)  Acute Rehab PT Goals Patient Stated Goal: improve mobility within precautions PT Goal Formulation: With patient/family Time For Goal Achievement: 12/26/23 Potential to Achieve Goals: Good    Frequency Min 1X/week     Co-evaluation               AM-PAC PT "6 Clicks" Mobility  Outcome Measure Help needed turning from your back to your side while in a flat bed without using bedrails?: None Help needed moving from lying on your back to sitting on the side of a flat bed without using bedrails?: A Little Help needed moving to and from  a bed to a chair (including a wheelchair)?: A Little Help needed standing up from a chair using your arms (e.g., wheelchair or bedside chair)?:  None Help needed to walk in hospital room?: A Little Help needed climbing 3-5 steps with a railing? : A Little 6 Click Score: 20    End of Session Equipment Utilized During Treatment: Gait belt Activity Tolerance: Patient tolerated treatment well Patient left:  (in bathroom with family present)   PT Visit Diagnosis: Muscle weakness (generalized) (M62.81);Other abnormalities of gait and mobility (R26.89);Difficulty in walking, not elsewhere classified (R26.2);Unsteadiness on feet (R26.81)    Time: 1027-2536 PT Time Calculation (min) (ACUTE ONLY): 32 min   Charges:   PT Evaluation $PT Eval Low Complexity: 1 Low PT Treatments $Therapeutic Activity: 8-22 mins PT General Charges $$ ACUTE PT VISIT: 1 Visit         [address], SPT   Media 12/12/2023, 2:16 PM

## 2023-12-13 ENCOUNTER — Other Ambulatory Visit (HOSPITAL_COMMUNITY): Payer: Self-pay

## 2023-12-13 LAB — BASIC METABOLIC PANEL
Anion gap: 8 (ref 5–15)
BUN: 7 mg/dL — ABNORMAL LOW (ref 8–23)
CO2: 29 mmol/L (ref 22–32)
Calcium: 9 mg/dL (ref 8.9–10.3)
Chloride: 102 mmol/L (ref 98–111)
Creatinine, Ser: 0.64 mg/dL (ref 0.44–1.00)
GFR, Estimated: >60 mL/min (ref >60)
Glucose, Bld: 93 mg/dL (ref 70–99)
Potassium: 3.5 mmol/L (ref 3.5–5.1)
Sodium: 139 mmol/L (ref 135–145)

## 2023-12-13 LAB — CBC
HCT: 28.2 % — ABNORMAL LOW (ref 36.0–46.0)
Hemoglobin: 9.3 g/dL — ABNORMAL LOW (ref 12.0–15.0)
MCH: 31.1 pg (ref 26.0–34.0)
MCHC: 33 g/dL (ref 30.0–36.0)
MCV: 94.3 fL (ref 80.0–100.0)
Platelets: 181 10*3/uL (ref 150–400)
RBC: 2.99 MIL/uL — ABNORMAL LOW (ref 3.87–5.11)
RDW: 14.3 % (ref 11.5–15.5)
WBC: 5.4 10*3/uL (ref 4.0–10.5)
nRBC: 0 % (ref 0.0–0.2)

## 2023-12-13 MED ORDER — FUROSEMIDE 40 MG PO TABS
40.0000 mg | ORAL_TABLET | Freq: Every day | ORAL | 0 refills | Status: DC
  Filled 2023-12-13: qty 5, 5d supply, fill #0

## 2023-12-13 MED ORDER — POTASSIUM CHLORIDE CRYS ER 20 MEQ PO TBCR
20.0000 meq | EXTENDED_RELEASE_TABLET | Freq: Every day | ORAL | 0 refills | Status: DC
  Filled 2023-12-13: qty 5, 5d supply, fill #0

## 2023-12-13 MED ORDER — OXYCODONE HCL 5 MG PO TABS
5.0000 mg | ORAL_TABLET | Freq: Four times a day (QID) | ORAL | 0 refills | Status: AC | PRN
  Filled 2023-12-13: qty 28, 7d supply, fill #0

## 2023-12-13 MED ORDER — ASPIRIN 81 MG PO TBEC: 81.0000 mg | DELAYED_RELEASE_TABLET | Freq: Every day | ORAL | Status: AC

## 2023-12-13 MED ORDER — FERROUS SULFATE 325 (65 FE) MG PO TABS: 325.0000 mg | ORAL_TABLET | Freq: Two times a day (BID) | ORAL | Status: AC

## 2023-12-13 MED ORDER — POTASSIUM CHLORIDE CRYS ER 20 MEQ PO TBCR
40.0000 meq | EXTENDED_RELEASE_TABLET | Freq: Once | ORAL | Status: AC
  Administered 2023-12-13: 40 meq via ORAL
  Filled 2023-12-13: qty 2

## 2023-12-13 NOTE — Progress Notes (Signed)
CARDIAC REHAB PHASE I   Pt ready for discharge home. Postop [facility] education completed. Referral for CRP2 sent to [facility].   Woodroe Chen, RN BSN 12/13/2023 10:52 AM

## 2023-12-13 NOTE — TOC Transition Note (Signed)
Transition of Care [facility]) - Discharge Note   Patient Details  Name: Patricia Alvarado MRN: [id number] Date of Birth: [birth year]  Transition of Care [facility]) CM/SW Contact:  Ronny Bacon, RN Phone Number: 12/13/2023, 8:57 AM   Clinical Narrative:   Patient is being discharged today. Spoke with patient by phone, confirmed she has rollator. Patient will call family for ride when she gets her discharge paperwork. Artavia with [facility] made aware.    Final next level of care: Home w Home Health Services Barriers to Discharge: No Barriers Identified   Patient Goals and CMS Choice Patient states their goals for this hospitalization and ongoing recovery are:: wants to get back to her independence CMS Medicare.gov Compare Post Acute Care list provided to:: Patient Choice offered to / list presented to : Patient      Discharge Placement                       Discharge Plan and Services Additional resources added to the After Visit Summary for     Discharge Planning Services: CM Consult Post Acute Care Choice: Home Health                               Social Drivers of Health (SDOH) Interventions SDOH Screenings   Food Insecurity: No Food Insecurity (12/07/2023)  Housing: Low Risk  (12/07/2023)  Transportation Needs: No Transportation Needs (12/07/2023)  Utilities: Not At Risk (12/07/2023)  Social Connections: Socially Integrated (12/07/2023)  Tobacco Use: Low Risk  (12/07/2023)     Readmission Risk Interventions     No data to display

## 2023-12-13 NOTE — Care Management Important Message (Signed)
Important Message  Patient Details  Name: Patricia Alvarado MRN: [id number] Date of Birth: 09-May-1946   Important Message Given:  Yes - Medicare IM     Renie Ora 12/13/2023, 11:18 AM

## 2023-12-13 NOTE — Progress Notes (Signed)
6 Days Post-Op Procedure(s) (LRB): MITRAL VALVE REPAIR USING SIMULUS SEMI-RIGID ANNULOPLASTY BAND SIZE (N/A) TRANSESOPHAGEAL ECHOCARDIOGRAM (TEE) (N/A) Subjective: Feeling well  Objective: Vital signs in last 24 hours: Temp:  [97.8 F (36.6 C)-98.5 F (36.9 C)] 97.8 F (36.6 C) (02/17 0433) Pulse Rate:  [70-76] 70 (02/16 2339) Cardiac Rhythm: Atrial fibrillation (02/16 2100) Resp:  [14-20] 16 (02/17 0433) BP: (117-133)/(52-65) 126/65 (02/17 0433) SpO2:  [95 %-100 %] 95 % (02/17 0433) Weight:  [68 kg] 68 kg (02/17 0433)  Hemodynamic parameters for last 24 hours:    Intake/Output from previous day: 02/16 0701 - 02/17 0700 In: 120 [P.O.:120] Out: -  Intake/Output this shift: No intake/output data recorded.  General appearance: alert, cooperative, and no distress Heart: regular rate and rhythm and occas extrasystole Lungs: clear to auscultation bilaterally Abdomen: benign Extremities: trace edema Wound: incis healing well  Lab Results: Recent Labs    12/11/23 0306 12/13/23 0257  WBC 5.9 5.4  HGB 8.3* 9.3*  HCT 24.6* 28.2*  PLT 119* 181   BMET:  Recent Labs    12/13/23 0257  NA 139  K 3.5  CL 102  CO2 29  GLUCOSE 93  BUN 7*  CREATININE 0.64  CALCIUM 9.0    PT/INR: No results for input(s): "LABPROT", "INR" in the last 72 hours. ABG    Component Value Date/Time   PHART 7.350 12/08/2023 0201   HCO3 24.7 12/08/2023 0201   TCO2 26 12/08/2023 0201   ACIDBASEDEF 1.0 12/08/2023 0201   O2SAT 99 12/08/2023 0201   CBG (last 3)  Recent Labs    12/10/23 2137 12/11/23 0636  GLUCAP 106* 88    Meds Scheduled Meds:  apixaban  5 mg Oral BID   aspirin EC  81 mg Oral Daily   Chlorhexidine Gluconate Cloth  6 each Topical Daily   escitalopram  20 mg Oral Daily   ezetimibe  10 mg Oral Daily   ferrous sulfate  325 mg Oral BID WC   furosemide  40 mg Oral Daily   pantoprazole  40 mg Oral Daily   potassium chloride  20 mEq Oral Daily   sodium chloride  flush  3 mL Intravenous Q12H   sotalol  160 mg Oral Q12H   thyroid  120 mg Oral QAC breakfast   Continuous Infusions: PRN Meds:.ALPRAZolam, bisacodyl **OR** [DISCONTINUED] bisacodyl, docusate sodium, fluticasone, metoprolol tartrate, mouth rinse, oxyCODONE, sodium chloride flush, traMADol  Xrays No results found.  Assessment/Plan: S/P Procedure(s) (LRB): MITRAL VALVE REPAIR USING SIMULUS SEMI-RIGID ANNULOPLASTY BAND SIZE (N/A) TRANSESOPHAGEAL ECHOCARDIOGRAM (TEE) (N/A) POD#6  1 afeb,Sinus mostly, PVC's trigemminy, short burst of supraventricular tach, vitals stable, back on eliquis, has ICD 2 O2 sats good on RA 3 voiding well, amts not measured, + BM, on lasix/K+, weight about 1.5 KG>preop 4 normal renal fxn 5 thrombocytopenia resolved 6 H/H conts to trend improved 7 HH arrangements in place for aide, PT/OT 8 stable for D/C   LOS: 6 days    Rowe Clack PA-C Pager [phone] 12/13/2023

## 2023-12-14 DIAGNOSIS — I272 Pulmonary hypertension, unspecified: Secondary | ICD-10-CM | POA: Diagnosis not present

## 2023-12-14 DIAGNOSIS — I4891 Unspecified atrial fibrillation: Secondary | ICD-10-CM | POA: Diagnosis not present

## 2023-12-14 DIAGNOSIS — Z79891 Long term (current) use of opiate analgesic: Secondary | ICD-10-CM | POA: Diagnosis not present

## 2023-12-14 DIAGNOSIS — Z7982 Long term (current) use of aspirin: Secondary | ICD-10-CM | POA: Diagnosis not present

## 2023-12-14 DIAGNOSIS — Z7951 Long term (current) use of inhaled steroids: Secondary | ICD-10-CM | POA: Diagnosis not present

## 2023-12-14 DIAGNOSIS — M797 Fibromyalgia: Secondary | ICD-10-CM | POA: Diagnosis not present

## 2023-12-14 DIAGNOSIS — I2510 Atherosclerotic heart disease of native coronary artery without angina pectoris: Secondary | ICD-10-CM | POA: Diagnosis not present

## 2023-12-14 DIAGNOSIS — Z85850 Personal history of malignant neoplasm of thyroid: Secondary | ICD-10-CM | POA: Diagnosis not present

## 2023-12-14 DIAGNOSIS — E039 Hypothyroidism, unspecified: Secondary | ICD-10-CM | POA: Diagnosis not present

## 2023-12-14 DIAGNOSIS — Z48812 Encounter for surgical aftercare following surgery on the circulatory system: Secondary | ICD-10-CM | POA: Diagnosis not present

## 2023-12-14 DIAGNOSIS — M1990 Unspecified osteoarthritis, unspecified site: Secondary | ICD-10-CM | POA: Diagnosis not present

## 2023-12-14 DIAGNOSIS — Z7901 Long term (current) use of anticoagulants: Secondary | ICD-10-CM | POA: Diagnosis not present

## 2023-12-14 DIAGNOSIS — Z85828 Personal history of other malignant neoplasm of skin: Secondary | ICD-10-CM | POA: Diagnosis not present

## 2023-12-14 DIAGNOSIS — Z853 Personal history of malignant neoplasm of breast: Secondary | ICD-10-CM | POA: Diagnosis not present

## 2023-12-14 DIAGNOSIS — Z604 Social exclusion and rejection: Secondary | ICD-10-CM | POA: Diagnosis not present

## 2023-12-14 DIAGNOSIS — I472 Ventricular tachycardia, unspecified: Secondary | ICD-10-CM | POA: Diagnosis not present

## 2023-12-14 DIAGNOSIS — Z952 Presence of prosthetic heart valve: Secondary | ICD-10-CM | POA: Diagnosis not present

## 2023-12-14 DIAGNOSIS — K219 Gastro-esophageal reflux disease without esophagitis: Secondary | ICD-10-CM | POA: Diagnosis not present

## 2023-12-16 ENCOUNTER — Other Ambulatory Visit (HOSPITAL_COMMUNITY): Payer: Self-pay

## 2023-12-16 NOTE — Telephone Encounter (Signed)
Called patient's son, Casimiro Needle [facility]) back about message. He needs a copy of the FMLA paper work. Emailed him a copy to the paper work that is in the chart.

## 2023-12-16 NOTE — Telephone Encounter (Signed)
Pt's son requesting cb to get copy of FMLA paperwork

## 2023-12-17 ENCOUNTER — Telehealth (HOSPITAL_COMMUNITY): Payer: Self-pay | Admitting: *Deleted

## 2023-12-17 NOTE — Telephone Encounter (Signed)
Cardiac Rehab Phase II referral faxed to [facility] per pt request. Ethelda Chick BS, ACSM-CEP 12/17/2023 1:57 PM

## 2023-12-20 ENCOUNTER — Telehealth: Payer: Self-pay

## 2023-12-20 ENCOUNTER — Other Ambulatory Visit (HOSPITAL_COMMUNITY): Payer: Self-pay

## 2023-12-20 MED FILL — Electrolyte-R (PH 7.4) Solution: INTRAVENOUS | Qty: 4000 | Status: AC

## 2023-12-20 MED FILL — Sodium Chloride IV Soln 0.9%: INTRAVENOUS | Qty: 2000 | Status: AC

## 2023-12-20 MED FILL — Sodium Bicarbonate IV Soln 8.4%: INTRAVENOUS | Qty: 50 | Status: AC

## 2023-12-20 MED FILL — Albumin, Human Inj 5%: INTRAVENOUS | Qty: 250 | Status: AC

## 2023-12-20 MED FILL — Heparin Sodium (Porcine) Inj 1000 Unit/ML: INTRAMUSCULAR | Qty: 10 | Status: AC

## 2023-12-20 NOTE — Telephone Encounter (Signed)
 Pt has upcoming apt with Clair Gulling s/p MVR 12/23/23.    Alert received from [employer] for  Presenting EGM c/w a ongoing atrial arrhythmia with significant atrial undersensing and competitive atrial pacing, hx of AF and on Eliquis per Epic; routed to clinic for review.  HF diagnostics currently abnormal.   Spoke to patient, device clinic apt made 12/23/23 to adjust RA sensitivity.   /

## 2023-12-21 ENCOUNTER — Other Ambulatory Visit: Payer: Self-pay

## 2023-12-23 ENCOUNTER — Ambulatory Visit (INDEPENDENT_AMBULATORY_CARE_PROVIDER_SITE_OTHER): Payer: PPO

## 2023-12-23 ENCOUNTER — Other Ambulatory Visit: Payer: Self-pay

## 2023-12-23 ENCOUNTER — Other Ambulatory Visit (HOSPITAL_COMMUNITY): Payer: Self-pay

## 2023-12-23 ENCOUNTER — Encounter: Payer: Self-pay | Admitting: Nurse Practitioner

## 2023-12-23 ENCOUNTER — Ambulatory Visit: Payer: PPO | Attending: Nurse Practitioner | Admitting: Nurse Practitioner

## 2023-12-23 VITALS — BP 130/60 | HR 77 | Ht 64.5 in | Wt 148.8 lb

## 2023-12-23 DIAGNOSIS — E785 Hyperlipidemia, unspecified: Secondary | ICD-10-CM

## 2023-12-23 DIAGNOSIS — I2510 Atherosclerotic heart disease of native coronary artery without angina pectoris: Secondary | ICD-10-CM | POA: Diagnosis not present

## 2023-12-23 DIAGNOSIS — Z9889 Other specified postprocedural states: Secondary | ICD-10-CM

## 2023-12-23 DIAGNOSIS — I700 Atherosclerosis of aorta: Secondary | ICD-10-CM

## 2023-12-23 DIAGNOSIS — I472 Ventricular tachycardia, unspecified: Secondary | ICD-10-CM | POA: Diagnosis not present

## 2023-12-23 DIAGNOSIS — I340 Nonrheumatic mitral (valve) insufficiency: Secondary | ICD-10-CM | POA: Diagnosis not present

## 2023-12-23 DIAGNOSIS — Z95810 Presence of automatic (implantable) cardiac defibrillator: Secondary | ICD-10-CM

## 2023-12-23 DIAGNOSIS — I480 Paroxysmal atrial fibrillation: Secondary | ICD-10-CM | POA: Diagnosis not present

## 2023-12-23 DIAGNOSIS — D6869 Other thrombophilia: Secondary | ICD-10-CM

## 2023-12-23 LAB — CUP PACEART INCLINIC DEVICE CHECK
Date Time Interrogation Session: 20250227103009
Implantable Lead Connection Status: 753985
Implantable Lead Connection Status: 753985
Implantable Lead Implant Date: 20240304
Implantable Lead Implant Date: 20240304
Implantable Lead Location: 753859
Implantable Lead Location: 753860
Implantable Lead Model: 5076
Implantable Pulse Generator Implant Date: 20240304

## 2023-12-23 MED ORDER — POTASSIUM CHLORIDE CRYS ER 20 MEQ PO TBCR: EXTENDED_RELEASE_TABLET | ORAL | Status: DC

## 2023-12-23 MED ORDER — FUROSEMIDE 20 MG PO TABS
ORAL_TABLET | ORAL | 3 refills | Status: DC
  Filled 2023-12-23: qty 30, 30d supply, fill #0

## 2023-12-23 NOTE — Progress Notes (Signed)
 Cardiology Office Note:  .   Date:  12/23/2023  ID:  Patricia Alvarado, DOB 23-Dec-1945, MRN [id number] PCP: Patricia Party, NP  [facility] Providers Cardiologist:  None Electrophysiologist:  Patricia Bunting, MD    Patient Profile: .      PMH NSVT S/p ICD implant 12/28/2022 Coronary artery disease CAC score 496 (87th percentile) on CCTA 08/2021 LHC 11/11/2023 Mild nonobstructive CAD pRCA 10% Normal right and left heart pressures Hypertension Hyperlipidemia PVCs Breast cancer Bilateral mastectomy 1992 Nipple cancer treated with resection and XRT 2022 with injury to right lung PAF S/p ablation 09/12/2021 Mitral regurgitation S/p mitral valve repair 12/07/2023 34 mm simulus semi rigid annuloplasty band Hypothyroidism  She reported history of VT in 1993 followed by episodes of presyncope and syncope.  She was placed on antiarrhythmic medication and has been a long time [facility] patient.  She later developed atrial fibrillation. Seen by Patricia Alvarado 07/2021 with return of A-fib on flecainide.  She underwent A-fib ablation with Patricia Alvarado on 09/12/2021. 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  In preparation for EGD, she underwent 2D echo 09/20/2023 which revealed worsening mitral regurgitation.  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  No valvular stenosis.  She was referred to CT surgery and underwent surgical mitral valve repair utilizing 34 mm simulus semi rigid annuloplasty band by Patricia Alvarado on 12/07/2023.  There were no significant complications, she was found to have thrombocytopenia but was continued on OAC.  She was  discharged on postop day 2.        History of Present Illness: .   Patricia Alvarado is a very pleasant 78 y.o. female who is here today for follow-up of mitral valve repair.  She reports post-surgical chest soreness and generalized weakness. No concerning chest pain typical for angina. She was dropped off at the door, but walking in was a little too much for her. She reports that the day prior, she awoke from her nap 2 or 3 times and was gasping. At the time, she was asleep on her back with two pillows under her head.  She has not experienced that feeling during the night while sleeping. She had a painful spasm in the neck, shoulder, and shoulder blades, which was severe enough to bring her to tears the day after hospital discharge. This has not recurred. She reports weight has been stable, home weight is 145 lb. Mild bilateral LE edema. She reports shortness of breath with walking in today but this is the farthest she has walked since surgery.  She had her first session of physical therapy yesterday which she tolerated well. Appetite is normal, and she is staying hydrated. She is currently taking Robaxin and Tylenol for pain management, and has finished a course of furosemide. No significant bleeding, dark tarry stools, blood in urine, or nosebleeds.   Discussed the use of AI scribe software for clinical note transcription with the patient, who gave verbal consent to proceed.   ROS: See HPI       Studies Reviewed: .        Risk Assessment/Calculations:    CHA2DS2-VASc Score = 4   This indicates a 4.8% annual risk of stroke. The patient's score is  based upon: CHF History: 0 HTN History: 1 Diabetes History: 0 Stroke History: 0 Vascular Disease History: 0 Age Score: 2 Gender Score: 1            Physical Exam:   VS:  BP 130/60 (BP Location: Right Arm)   Pulse 77   Ht 5' 4.5" (1.638 m)   Wt 148 lb 12.8 oz (67.5 kg)   SpO2 96%   BMI 25.15 kg/m    Wt Readings from Last 3 Encounters:   12/23/23 148 lb 12.8 oz (67.5 kg)  12/13/23 149 lb 14.4 oz (68 kg)  12/03/23 158 lb (71.7 kg)    GEN: Well nourished, well developed in no acute distress NECK: No JVD; No carotid bruits CARDIAC: Irregular RR, no murmurs, rubs, gallops RESPIRATORY:  Clear to auscultation without rales, wheezing or rhonchi  ABDOMEN/CHEST: Sternotomy incision is well approximated without s/s of infection. Soft, non-tender, non-distended EXTREMITIES:  No edema; No deformity     ASSESSMENT AND PLAN: .    Severe MR S/p mitral valve repair: She reports mild chest soreness that is appropriate for current stage of healing. Sternal wound is well healed. No palpitations. Episodes of waking from nap with gasping breaths, but no orthopnea or PND while sleeping overnight. Weight is stable, and there is minimal peripheral edema. Lung sounds are clear. Overall volume status appears stable. We will give her Lasix 20 mg daily as needed for swelling along with Kdur 20 mEq. Advised her to continue to monitor weight and take Lasix for weight gain > 2 lbs in 24 hours or 5 lbs in one week or for increased edema or shortness of breath. Continue sternal precautions and PT/OT sessions. She has follow-up with [facility] next week.   Atrial fibrillation s/p ablation: Atrial arrhythmia noted on remote device check.  She will be seen by device clinic today for adjustment. She is asymptomatic. HR is well controlled. No bleeding concerns.  Continue Eliquis 5 mg twice daily which is appropriate dose for stroke prevention for CHA2DS2-VASc score of 4.   NSVT: No presyncope or syncope, no significant tachypalpitations. She is going to device clinic for adjustment of her ICD secondary to atrial arrhythmia noted on remote device check. Continue sotalol. Follow-up with EP in 3 months.   Hypertension: BP is well controlled. Renal function stable on labs completed 12/13/23.   CAD: Left heart cath 11/11/2023 with mild nonobstructive CAD (proximal RCA lesion  10% stenosed).  She denies chest pain, dyspnea, or other symptoms concerning for angina.  No indication for further ischemic evaluation at this time. As noted below, will need to follow lipid levels since she is no longer on Repatha.  She was asked PCP to draw lipid panel.  Hyperlipidemia LDL goal < 70/Aortic atherosclerosis: She was previously on Repatha but it was found to be too expensive to continue in 2025.  Most recent lipid panel 08/09/2023 with total cholesterol 149, HDL 60, LDL 68, and triglycerides 098.  Aim to keep LDL 70 or lower.  She is currently on ezetimibe. Advised her to notify us if she is not able to get lipid panel with PCP soon.       Disposition:3 months with Dr. Ladona Ridgel   Signed, Eligha Bridegroom, NP-C

## 2023-12-23 NOTE — Progress Notes (Signed)
 Pt seen in device clinic for alert regarding undersensing on the atrial lead. Pt had open heart surgery on 12/07/2023 for mitral valve repair. Pre procedure: A sense 2.5 mv A capture threshold 0.75 V @ 0.4 ms.  Alert received noting atrial undersensing with a new increase in capture threshold. Today atrial capture: 3V at 0.22ms 2.5V at 0.63ms 1.75V at 1ms. Atrial sensing today measures 0.35mV.  Changes made today: Increased atrial sensitivity to 0.64mv. Changed atrial output to monitor at 3V at 1ms.  Battery life pre procedure 11.7 years.  After reprogramming today battery life 9.1 years. Will review with Dr. Ladona Ridgel.

## 2023-12-23 NOTE — Patient Instructions (Signed)
 Will discuss the results of today's testing with Dr. Ladona Ridgel.   Will call you with treatment plan.

## 2023-12-23 NOTE — Patient Instructions (Signed)
 Medication Instructions:   START Lasix one (1) tablet by mouth ( 20 mg ) daily as needed for wt gain of 2 lbs in 24 hours or 5 lbs in one week with K-dur.  START K-dur one (1) tablet by mouth ( 20 mEq) daily as needed for wt gain of 2 lbs in 24 hours or 5 lbs in one week with Lasix.    *If you need a refill on your cardiac medications before your next appointment, please call your pharmacy*   Lab Work:  TODAY!!!! CBC  If you have labs (blood work) drawn today and your tests are completely normal, you will receive your results only by: MyChart Message (if you have MyChart) OR A paper copy in the mail If you have any lab test that is abnormal or we need to change your treatment, we will call you to review the results.   Testing/Procedures:  None ordered.   Follow-Up: At [facility], you and your health needs are our priority.  As part of our continuing mission to provide you with exceptional heart care, we have created designated Provider Care Teams.  These Care Teams include your primary Cardiologist (physician) and Advanced Practice Providers (APPs -  Physician Assistants and Nurse Practitioners) who all work together to provide you with the care you need, when you need it.  We recommend signing up for the patient portal called "MyChart".  Sign up information is provided on this After Visit Summary.  MyChart is used to connect with patients for Virtual Visits (Telemedicine).  Patients are able to view lab/test results, encounter notes, upcoming appointments, etc.  Non-urgent messages can be sent to your provider as well.   To learn more about what you can do with MyChart, go to [email or web address].    Your next appointment:   3 month(s)  Provider:   Lewayne Bunting, MD    Other Instructions    1st Floor: - Lobby - Registration  - Pharmacy  - Lab - Cafe  2nd Floor: - PV Lab - Diagnostic Testing (echo, CT, nuclear med)  3rd Floor: - Vacant  4th  Floor: - [facility] (cardiothoracic surgery) - AFib Clinic - Structural Heart Clinic - Vascular Surgery  - Vascular Ultrasound  5th Floor: - [facility] (general and EP) - Clinical Pharmacy for coumadin, hypertension, lipid, weight-loss medications, and med management appointments    Valet parking services will be available as well.

## 2023-12-24 ENCOUNTER — Other Ambulatory Visit (HOSPITAL_COMMUNITY): Payer: Self-pay

## 2023-12-24 DIAGNOSIS — I480 Paroxysmal atrial fibrillation: Secondary | ICD-10-CM | POA: Diagnosis not present

## 2023-12-24 DIAGNOSIS — Z6824 Body mass index (BMI) 24.0-24.9, adult: Secondary | ICD-10-CM | POA: Diagnosis not present

## 2023-12-24 DIAGNOSIS — M797 Fibromyalgia: Secondary | ICD-10-CM | POA: Diagnosis not present

## 2023-12-24 DIAGNOSIS — Z79899 Other long term (current) drug therapy: Secondary | ICD-10-CM | POA: Diagnosis not present

## 2023-12-24 DIAGNOSIS — E785 Hyperlipidemia, unspecified: Secondary | ICD-10-CM | POA: Diagnosis not present

## 2023-12-24 DIAGNOSIS — Z9889 Other specified postprocedural states: Secondary | ICD-10-CM | POA: Diagnosis not present

## 2023-12-24 DIAGNOSIS — D539 Nutritional anemia, unspecified: Secondary | ICD-10-CM | POA: Diagnosis not present

## 2023-12-24 DIAGNOSIS — E039 Hypothyroidism, unspecified: Secondary | ICD-10-CM | POA: Diagnosis not present

## 2023-12-24 LAB — CBC
Hematocrit: 33.1 % — ABNORMAL LOW (ref 34.0–46.6)
Hemoglobin: 10.8 g/dL — ABNORMAL LOW (ref 11.1–15.9)
MCH: 31 pg (ref 26.6–33.0)
MCHC: 32.6 g/dL (ref 31.5–35.7)
MCV: 95 fL (ref 79–97)
Platelets: 412 10*3/uL (ref 150–450)
RBC: 3.48 x10E6/uL — ABNORMAL LOW (ref 3.77–5.28)
RDW: 12.7 % (ref 11.7–15.4)
WBC: 7.5 10*3/uL (ref 3.4–10.8)

## 2023-12-24 MED ORDER — ALPRAZOLAM 0.5 MG PO TABS
0.5000 mg | ORAL_TABLET | Freq: Every evening | ORAL | 1 refills | Status: DC | PRN
  Filled 2023-12-24: qty 30, 30d supply, fill #0
  Filled 2024-02-07: qty 30, 30d supply, fill #1

## 2023-12-24 MED ORDER — METHOCARBAMOL 500 MG PO TABS
500.0000 mg | ORAL_TABLET | Freq: Three times a day (TID) | ORAL | 1 refills | Status: AC
  Filled 2023-12-24 – 2024-01-18 (×2): qty 30, 10d supply, fill #0
  Filled 2024-05-22: qty 30, 10d supply, fill #1

## 2023-12-27 ENCOUNTER — Encounter: Payer: Self-pay | Admitting: Internal Medicine

## 2023-12-27 ENCOUNTER — Other Ambulatory Visit: Payer: Self-pay | Admitting: Thoracic Surgery (Cardiothoracic Vascular Surgery)

## 2023-12-27 DIAGNOSIS — I059 Rheumatic mitral valve disease, unspecified: Secondary | ICD-10-CM

## 2023-12-28 ENCOUNTER — Telehealth: Payer: Self-pay | Admitting: Pharmacy Technician

## 2023-12-28 ENCOUNTER — Telehealth: Payer: Self-pay | Admitting: Cardiology

## 2023-12-28 ENCOUNTER — Other Ambulatory Visit (HOSPITAL_COMMUNITY): Payer: Self-pay

## 2023-12-28 ENCOUNTER — Ambulatory Visit (INDEPENDENT_AMBULATORY_CARE_PROVIDER_SITE_OTHER): Payer: PPO

## 2023-12-28 ENCOUNTER — Ambulatory Visit
Admission: RE | Admit: 2023-12-28 | Discharge: 2023-12-28 | Disposition: A | Discharge status: Home / Self Care | Source: Ambulatory Visit | Attending: Thoracic Surgery (Cardiothoracic Vascular Surgery)

## 2023-12-28 ENCOUNTER — Telehealth: Payer: Self-pay

## 2023-12-28 ENCOUNTER — Telehealth: Payer: Self-pay | Admitting: Nurse Practitioner

## 2023-12-28 ENCOUNTER — Ambulatory Visit (INDEPENDENT_AMBULATORY_CARE_PROVIDER_SITE_OTHER): Payer: Self-pay | Admitting: Surgical

## 2023-12-28 VITALS — BP 127/81 | HR 81 | Resp 18 | Ht 64.0 in | Wt 145.0 lb

## 2023-12-28 DIAGNOSIS — I472 Ventricular tachycardia, unspecified: Secondary | ICD-10-CM

## 2023-12-28 DIAGNOSIS — E785 Hyperlipidemia, unspecified: Secondary | ICD-10-CM

## 2023-12-28 DIAGNOSIS — I059 Rheumatic mitral valve disease, unspecified: Secondary | ICD-10-CM

## 2023-12-28 DIAGNOSIS — I480 Paroxysmal atrial fibrillation: Secondary | ICD-10-CM

## 2023-12-28 DIAGNOSIS — Z9889 Other specified postprocedural states: Secondary | ICD-10-CM

## 2023-12-28 DIAGNOSIS — Z952 Presence of prosthetic heart valve: Secondary | ICD-10-CM | POA: Diagnosis not present

## 2023-12-28 MED ORDER — REPATHA SURECLICK 140 MG/ML ~~LOC~~ SOAJ: 140.0000 mg | SUBCUTANEOUS | 3 refills | Status: DC

## 2023-12-28 MED ORDER — REPATHA SURECLICK 140 MG/ML ~~LOC~~ SOAJ
140.0000 mg | SUBCUTANEOUS | 3 refills | Status: DC
  Filled 2023-12-28: qty 6, 84d supply, fill #0

## 2023-12-28 NOTE — Telephone Encounter (Signed)
 Patient c/o Palpitations:  High priority if patient c/o lightheadedness, shortness of breath, or chest pain  How long have you had palpitations/irregular HR/ Afib? Are you having the symptoms now?  Patient states she felt her heart beating out of rhythm last night. Patient states it is still irregular.  Are you currently experiencing lightheadedness, SOB or CP?  No   Do you have a history of afib (atrial fibrillation) or irregular heart rhythm?  Hx irregular HR   Have you checked your BP or HR? (document readings if available):  Did not check BP   Are you experiencing any other symptoms?  SOB (not currently) + CP about 3 weeks since having procedure on sternum

## 2023-12-28 NOTE — Telephone Encounter (Signed)
 Pharmacy Patient Advocate Encounter  Received notification from [facility] [employer] that Prior Authorization for repatha has been APPROVED from 12/28/23 to 06/25/24. Ran test claim, Copay is $117.50- 3 months. This test claim was processed through [facility]- copay amounts may vary at other pharmacies due to pharmacy/plan contracts, or as the patient moves through the different stages of their insurance plan.   PA #/Case ID/Reference #: [id number]

## 2023-12-28 NOTE — Telephone Encounter (Signed)
 Pt stated that she has felt like her heart rare is irregular and last night it was "intense for hours". HR 115 and then dropped to 40 during this episode. BP was 104/37 and P 80. Pt states she has had some shortness of breath during these episodes but not currently.   Current BP 104/69 P 77  CP due to surgery from mitral valve repair.   Hx afib- ablation 12/23/22, ICD placed 12/28/22  Roney Mans, RN stated pt is out of rhythm per ICD transmission and has placed pt on Dr. Lubertha Basque schedule for 3/6. Pt is aware of this.

## 2023-12-28 NOTE — Telephone Encounter (Signed)
 Please notify patient that Repatha is 117.50 for 3 months. This was the price through a [facility] pharmacy, it may vary at another location. Is this feasible for her? If not, we can see what the cost of Nexlitol will be but it will likely be similar.

## 2023-12-28 NOTE — Telephone Encounter (Signed)
 Patient sent mychart message asking to send repatha to [facility]. Patient has been notified through my chart. Please see mycharrt encounter 3/4

## 2023-12-28 NOTE — Progress Notes (Signed)
 [address]       [name] [address]             [phone]      Patricia Alvarado [place] Medical Record #[id number] Date of Birth: 07/12/1946  Referring: [name], MD Primary Care: [name], NP Primary Cardiologist: None   Chief Complaint:   POST OP FOLLOW UP CARDIOVASCULAR SURGERY OPERATIVE NOTE   12/07/2023 Patricia Alvarado [id number]   Surgeon:  Ashley Akin, MD   First Assistant: Aloha Gell [facility]                                   Preoperative Diagnosis:  Severe Mitral Regurgitation   Postoperative Diagnosis:  Same     Procedure: Mitral Valve Ring annulplasty with a 34 mm Simulus Semi Rigid Band   Anesthesia:  General Endotracheal    History of Present Illness: Patient is a 78 year old female status post the above described procedures in the office in routine postsurgical follow-up.  She reports that she is making general good overall progress.  She does have some shortness of breath/DOE but is improving.  He is using some pain medication and muscle relaxer but it is decreasing over time.  She is not having any fevers, chills or cold he is with her incisions.  She has had some fairly chronic nausea that has been present even before surgery.  It varies in intensity over time.  She has had significant palpitations recently and has been in contact with cardiology/EP who evaluated her pacer ICD.  She has also been seen by cardiology and they put her on a intermittent diuretic use schedule based on weight gain.  She says her weight has been stable for several days.  She is not having any increase in edema does however have some chronic ankle swelling which is nonpitting.      Past Medical History:  Diagnosis Date   A-fib [facility])    AICD (automatic cardioverter/defibrillator) present    Medtronic. Managed by Dr. Lewayne Bunting   Anxiety    Arthritis    thumb   Basal cell carcinoma 2001   "forehead, between eyebrows" right arm  (2022)   Breast cancer (HCC)    Carcinoma of thyroid gland (HCC) 2001   Chronic lower back pain    "worse is across my hips" (05/26/2016)   Depression    Dyslipidemia    Dyspnea    Dysrhythmia    A. Fib   Family history of breast cancer    Family history of kidney cancer    Family history of leukemia    Family history of nonmelanoma skin cancer    Fibrocystic breast    Fibromyalgia    GERD (gastroesophageal reflux disease)    Heart murmur    History of blood transfusion 1992   "after subcutaneous mastectomies"   History of hiatal hernia    Hypertension    not on any medications   Hypothyroidism    Malignant melanoma of left ankle (HCC) 2001   Migraine    "visual; 2-3 times/year" (05/26/2016)   Mitral valve prolapse    PONV (postoperative nausea and vomiting)    Presence of permanent cardiac pacemaker    Medtronic   Ventricular tachycardia (HCC)    Hx of, controlled on sotalol therapy     Social History   Tobacco Use  Smoking  Status Never   Passive exposure: Never  Smokeless Tobacco Never    Social History   Substance and Sexual Activity  Alcohol Use No     Allergies  Allergen Reactions   Quinidine Palpitations    REACTION: increased heart rate   Statins     Muscle pain   Tape     If left on long enough,will tear skin    Amoxicillin Itching and Rash   Atenolol Hives   Dofetilide Other (See Comments)    Elevated QTCs   Mexiletine Hcl Palpitations    Dizzy and extreme tiredness   Nadolol Hives   Vicodin  [Hydrocodone-Acetaminophen] Swelling    Facial redness and swelling    Current Outpatient Medications  Medication Sig Dispense Refill   acetaminophen (TYLENOL) 500 MG tablet Take 1,000 mg by mouth every 6 (six) hours as needed for mild pain or moderate pain.     ALPRAZolam (XANAX) 0.5 MG tablet Take 1 tablet (0.5 mg total) by mouth at bedtime as needed for sleep 30 tablet 1   apixaban (ELIQUIS) 5 MG TABS tablet Take 1 tablet (5 mg total) by mouth 2  (two) times daily. 180 tablet 1   Ascorbic Acid (VITAMIN C) 1000 MG tablet Take 1,000 mg by mouth daily.     aspirin EC 81 MG tablet Take 1 tablet (81 mg total) by mouth daily. Swallow whole.     B Complex-C (SUPER B COMPLEX PO) Take 1 capsule by mouth at bedtime.     beta carotene 84696 UNIT capsule Take 10,000 Units by mouth at bedtime.     Biotin 5 MG CAPS Take 5 mg by mouth at bedtime.      Calcium 600-200 MG-UNIT tablet Take 1 tablet by mouth daily.     cetirizine (ZYRTEC) 10 MG tablet Take 10 mg by mouth at bedtime.     Cholecalciferol (VITAMIN D) 50 MCG (2000 UT) CAPS Take 2,000 Units by mouth daily.     Coenzyme Q10 (COQ-10) 100 MG CAPS Take 100 mg by mouth daily.     Digestive Enzyme CAPS Take 1 capsule by mouth daily as needed (heartburn).     escitalopram (LEXAPRO) 20 MG tablet Take 1 tablet (20 mg total) by mouth daily. 90 tablet 4   Evolocumab (REPATHA SURECLICK) 140 MG/ML SOAJ Inject 140 mg into the skin every 14 (fourteen) days. 6 mL 3   ezetimibe (ZETIA) 10 MG tablet Take 1 tablet (10 mg total) by mouth daily. 90 tablet 4   famotidine (PEPCID) 20 MG tablet Take 20 mg by mouth as needed for heartburn or indigestion.     ferrous sulfate 325 (65 FE) MG tablet Take 1 tablet (325 mg total) by mouth 2 (two) times daily with a meal.     fluticasone (FLONASE) 50 MCG/ACT nasal spray Place 1 spray into both nostrils daily as needed for allergies.     furosemide (LASIX) 20 MG tablet Take one (1) tablet by mouth ( 20 mg) daily as needed for wt gain of 2 lbs in 24 hours or 5 lbs in one week. 30 tablet 3   hydrocortisone cream 1 % Apply 1 Application topically daily as needed for itching.     MAGNESIUM MALATE PO Take 1,000 mg by mouth daily.     MANGANESE PO Take 40 mg by mouth at bedtime.     Melatonin 10 MG TABS Take 10 mg by mouth at bedtime.      Menatetrenone (VITAMIN K2) 100 MCG TABS  Take 100 mcg by mouth daily.     methocarbamol (ROBAXIN) 500 MG tablet Take 1 tablet (500 mg total) by  mouth every 8 (eight) hours as needed for neck pain 30 tablet 1   methocarbamol (ROBAXIN) 500 MG tablet Take 1 tablet (500 mg total) by mouth every 8 (eight) hours. as needed for neck pain 30 tablet 1   omeprazole (PRILOSEC) 40 MG capsule Take 1 capsule (40 mg total) by mouth every morning 30 minutes before breakfast 30 capsule 3   Polyvinyl Alcohol (LIQUID TEARS OP) Place 1 drop into both eyes daily as needed (dry eyes).     potassium chloride SA (KLOR-CON M) 20 MEQ tablet Take one (1) tablet by mouth with lasix.     Simethicone 125 MG CAPS Take 250 mg by mouth daily as needed (bloating).     sotalol (BETAPACE) 160 MG tablet Take 1 tablet (160 mg total) by mouth 2 (two) times daily. 180 tablet 2   thyroid (ARMOUR) 120 MG tablet Take 120 mg by mouth daily before breakfast.     triamcinolone (KENALOG) 0.025 % cream Apply 1 Application topically 2 (two) times daily as needed (rash).     zinc gluconate 50 MG tablet Take 50 mg by mouth daily.     No current facility-administered medications for this visit.       Physical Exam: BP 127/81   Pulse 81   Resp 18   Ht 5\' 4"  (1.626 m)   Wt 145 lb (65.8 kg)   SpO2 96% Comment: RA  BMI 24.89 kg/m   General appearance: alert, cooperative, no distress, and looks as though she does not feel particularly well Heart: Regular rate and rhythm, no murmur, no rub Lungs: Very slightly diminished in the left Alvarado Abdomen: Benign Extremities: Minor ankle nonpitting swelling Wound: Incision healing well without evidence of infection   Diagnostic Studies & Laboratory data:     Recent Radiology Findings:   No results found.    Recent Lab Findings: Lab Results  Component Value Date   WBC 7.5 12/23/2023   HGB 10.8 (L) 12/23/2023   HCT 33.1 (L) 12/23/2023   PLT 412 12/23/2023   GLUCOSE 93 12/13/2023   CHOL 155 08/21/2022   TRIG 94 08/21/2022   HDL 74 08/21/2022   LDLCALC 64 08/21/2022   ALT 19 12/03/2023   AST 27 12/03/2023   NA 139  12/13/2023   K 3.5 12/13/2023   CL 102 12/13/2023   CREATININE 0.64 12/13/2023   BUN 7 (L) 12/13/2023   CO2 29 12/13/2023   TSH 0.130 (L) 05/26/2016   INR 1.5 (H) 12/07/2023   HGBA1C 5.4 12/03/2023      Assessment / Plan: Steady but slow overall progress.  She is in close contact with cardiology and in particular EP due to her device.  She reports that she has had both tachycardia and bradycardia arrhythmias and is scheduled to see them on Thursday.  I did review her chest x-ray in the left basilar aeration/effusion has improved over time.  She will continue Lasix on a as needed basis per cardiology instructions.  I did not make any changes to her current medication regimen.  Discussed activity progression including driving.  I feel that she agrees that until rhythm issues are much more stable and she is no longer requiring pain medications she should not drive.  From a surgical perspective there is no specific need for her to continue ongoing office follow-up.  We will see the  patient again on a as needed basis for any surgically related needs or at request.      Medication Changes: No orders of the defined types were placed in this encounter.     Rowe Clack, PA-C  12/28/2023 3:36 PM

## 2023-12-28 NOTE — Telephone Encounter (Signed)
 She was previously on Repatha but it became cost prohibitive. Mild CAD, recent mitral valve repair. LDL increased to 108 off the medication, improved from 143 to 64 when on Repatha in the past. Can we see what cost is for her now?  Thank you, Marcelino Duster

## 2023-12-28 NOTE — Telephone Encounter (Signed)
 Pharmacy Patient Advocate Encounter   Received notification from Pt Calls Messages that prior authorization for Repatha is required/requested.   Insurance verification completed.   The patient is insured through [facility] [employer] .   Per test claim: PA required; PA submitted to above mentioned insurance via CoverMyMeds Key/confirmation #/EOC [id number] Status is pending

## 2023-12-28 NOTE — Patient Instructions (Signed)
 Activities progression as discussed.

## 2023-12-29 ENCOUNTER — Other Ambulatory Visit (HOSPITAL_BASED_OUTPATIENT_CLINIC_OR_DEPARTMENT_OTHER): Payer: Self-pay | Admitting: *Deleted

## 2023-12-29 ENCOUNTER — Other Ambulatory Visit: Payer: Self-pay

## 2023-12-29 ENCOUNTER — Other Ambulatory Visit (HOSPITAL_COMMUNITY): Payer: Self-pay

## 2023-12-29 DIAGNOSIS — E785 Hyperlipidemia, unspecified: Secondary | ICD-10-CM

## 2023-12-29 MED ORDER — REPATHA SURECLICK 140 MG/ML ~~LOC~~ SOAJ
140.0000 mg | SUBCUTANEOUS | 3 refills | Status: AC
  Filled 2023-12-29: qty 6, 84d supply, fill #0
  Filled 2024-03-01 – 2024-03-18 (×2): qty 6, 84d supply, fill #1
  Filled 2024-05-31: qty 6, 84d supply, fill #2
  Filled 2024-09-02 – 2024-09-14 (×2): qty 6, 84d supply, fill #3

## 2023-12-29 NOTE — Telephone Encounter (Signed)
 S/w pt, pt stated could afford the Repatha. Pt wanted this medication sent in to [facility]. Pt stated will have to be mindful of ALC due to repatha causing diabetes.   Resent to correct pharmacy. Will send to [facility] to [facility].

## 2023-12-30 ENCOUNTER — Other Ambulatory Visit: Payer: Self-pay

## 2023-12-30 ENCOUNTER — Ambulatory Visit: Attending: Internal Medicine | Admitting: Internal Medicine

## 2023-12-30 ENCOUNTER — Other Ambulatory Visit (HOSPITAL_COMMUNITY): Payer: Self-pay

## 2023-12-30 ENCOUNTER — Encounter: Payer: Self-pay | Admitting: Pharmacist

## 2023-12-30 ENCOUNTER — Encounter: Payer: Self-pay | Admitting: Internal Medicine

## 2023-12-30 VITALS — BP 148/70 | HR 93 | Ht 64.0 in | Wt 147.8 lb

## 2023-12-30 DIAGNOSIS — Z95810 Presence of automatic (implantable) cardiac defibrillator: Secondary | ICD-10-CM | POA: Diagnosis not present

## 2023-12-30 DIAGNOSIS — I472 Ventricular tachycardia, unspecified: Secondary | ICD-10-CM

## 2023-12-30 LAB — CUP PACEART REMOTE DEVICE CHECK
Battery Remaining Longevity: 110 mo
Battery Voltage: 3.01 V
Brady Statistic AP VP Percent: 0.15 %
Brady Statistic AP VS Percent: 9.22 %
Brady Statistic AS VP Percent: 1.92 %
Brady Statistic AS VS Percent: 88.71 %
Brady Statistic RA Percent Paced: 10.95 %
Brady Statistic RV Percent Paced: 2.07 %
Date Time Interrogation Session: 20250302000010
HighPow Impedance: 62 Ohm
Implantable Lead Connection Status: 753985
Implantable Lead Connection Status: 753985
Implantable Lead Implant Date: 20240304
Implantable Lead Implant Date: 20240304
Implantable Lead Location: 753859
Implantable Lead Location: 753860
Implantable Lead Model: 5076
Implantable Pulse Generator Implant Date: 20240304
Lead Channel Impedance Value: 247 Ohm
Lead Channel Impedance Value: 342 Ohm
Lead Channel Impedance Value: 437 Ohm
Lead Channel Pacing Threshold Amplitude: 1.125 V
Lead Channel Pacing Threshold Amplitude: 2.125 V
Lead Channel Pacing Threshold Pulse Width: 0.4 ms
Lead Channel Pacing Threshold Pulse Width: 0.4 ms
Lead Channel Sensing Intrinsic Amplitude: 0.1 mV
Lead Channel Sensing Intrinsic Amplitude: 10.1 mV
Lead Channel Setting Pacing Amplitude: 2 V
Lead Channel Setting Pacing Amplitude: 3 V
Lead Channel Setting Pacing Pulse Width: 0.4 ms
Lead Channel Setting Sensing Sensitivity: 0.3 mV
Zone Setting Status: 755011

## 2023-12-30 NOTE — Progress Notes (Signed)
 HPI Patricia Alvarado returns today for followup. She is a pleasant 78 yo woman with  H/o NSVT, HTN, and PVC's. She has developed recurrent breast CA in the interim. She also had atrial fib. She was diagnosed with mitral annular dysjunction and prolonged VT. She underwent ICD insertion about one year ago. In the interim she developed worsening MR and undergone MV repair. Post op she has been found to have malfunction of her atrial pacing lead although the lead is in place on CXR and her impedence is stable. Allergies  Allergen Reactions   Quinidine Palpitations    REACTION: increased heart rate   Statins     Muscle pain   Tape     If left on long enough,will tear skin    Amoxicillin Itching and Rash   Atenolol Hives   Dofetilide Other (See Comments)    Elevated QTCs   Mexiletine Hcl Palpitations    Dizzy and extreme tiredness   Nadolol Hives   Vicodin  [Hydrocodone-Acetaminophen] Swelling    Facial redness and swelling     Current Outpatient Medications  Medication Sig Dispense Refill   acetaminophen (TYLENOL) 500 MG tablet Take 1,000 mg by mouth every 6 (six) hours as needed for mild pain or moderate pain.     ALPRAZolam (XANAX) 0.5 MG tablet Take 1 tablet (0.5 mg total) by mouth at bedtime as needed for sleep 30 tablet 1   apixaban (ELIQUIS) 5 MG TABS tablet Take 1 tablet (5 mg total) by mouth 2 (two) times daily. 180 tablet 1   Ascorbic Acid (VITAMIN C) 1000 MG tablet Take 1,000 mg by mouth daily.     aspirin EC 81 MG tablet Take 1 tablet (81 mg total) by mouth daily. Swallow whole.     B Complex-C (SUPER B COMPLEX PO) Take 1 capsule by mouth at bedtime.     beta carotene 40981 UNIT capsule Take 10,000 Units by mouth at bedtime.     Biotin 5 MG CAPS Take 5 mg by mouth at bedtime.      Calcium 600-200 MG-UNIT tablet Take 1 tablet by mouth daily.     cetirizine (ZYRTEC) 10 MG tablet Take 10 mg by mouth at bedtime.     Cholecalciferol (VITAMIN D) 50 MCG (2000 UT) CAPS Take  2,000 Units by mouth daily.     Coenzyme Q10 (COQ-10) 100 MG CAPS Take 100 mg by mouth daily.     Digestive Enzyme CAPS Take 1 capsule by mouth daily as needed (heartburn).     escitalopram (LEXAPRO) 20 MG tablet Take 1 tablet (20 mg total) by mouth daily. 90 tablet 4   Evolocumab (REPATHA SURECLICK) 140 MG/ML SOAJ Inject 140 mg into the skin every 14 (fourteen) days. 6 mL 3   ezetimibe (ZETIA) 10 MG tablet Take 1 tablet (10 mg total) by mouth daily. 90 tablet 4   famotidine (PEPCID) 20 MG tablet Take 20 mg by mouth as needed for heartburn or indigestion.     ferrous sulfate 325 (65 FE) MG tablet Take 1 tablet (325 mg total) by mouth 2 (two) times daily with a meal.     fluticasone (FLONASE) 50 MCG/ACT nasal spray Place 1 spray into both nostrils daily as needed for allergies.     furosemide (LASIX) 20 MG tablet Take one (1) tablet by mouth ( 20 mg) daily as needed for wt gain of 2 lbs in 24 hours or 5 lbs in one week. 30 tablet 3  hydrocortisone cream 1 % Apply 1 Application topically daily as needed for itching.     MAGNESIUM MALATE PO Take 1,000 mg by mouth daily.     MANGANESE PO Take 40 mg by mouth at bedtime.     Melatonin 10 MG TABS Take 10 mg by mouth at bedtime.      Menatetrenone (VITAMIN K2) 100 MCG TABS Take 100 mcg by mouth daily.     methocarbamol (ROBAXIN) 500 MG tablet Take 1 tablet (500 mg total) by mouth every 8 (eight) hours as needed for neck pain 30 tablet 1   methocarbamol (ROBAXIN) 500 MG tablet Take 1 tablet (500 mg total) by mouth every 8 (eight) hours. as needed for neck pain 30 tablet 1   omeprazole (PRILOSEC) 40 MG capsule Take 1 capsule (40 mg total) by mouth every morning 30 minutes before breakfast 30 capsule 3   Polyvinyl Alcohol (LIQUID TEARS OP) Place 1 drop into both eyes daily as needed (dry eyes).     potassium chloride SA (KLOR-CON M) 20 MEQ tablet Take one (1) tablet by mouth with lasix.     Simethicone 125 MG CAPS Take 250 mg by mouth daily as needed  (bloating).     sotalol (BETAPACE) 160 MG tablet Take 1 tablet (160 mg total) by mouth 2 (two) times daily. 180 tablet 2   thyroid (ARMOUR) 120 MG tablet Take 120 mg by mouth daily before breakfast.     triamcinolone (KENALOG) 0.025 % cream Apply 1 Application topically 2 (two) times daily as needed (rash).     zinc gluconate 50 MG tablet Take 50 mg by mouth daily.     No current facility-administered medications for this visit.     Past Medical History:  Diagnosis Date   A-fib [facility])    AICD (automatic cardioverter/defibrillator) present    Medtronic. Managed by Dr. Lewayne Bunting   Anxiety    Arthritis    thumb   Basal cell carcinoma 2001   "forehead, between eyebrows" right arm (2022)   Breast cancer (HCC)    Carcinoma of thyroid gland (HCC) 2001   Chronic lower back pain    "worse is across my hips" (05/26/2016)   Depression    Dyslipidemia    Dyspnea    Dysrhythmia    A. Fib   Family history of breast cancer    Family history of kidney cancer    Family history of leukemia    Family history of nonmelanoma skin cancer    Fibrocystic breast    Fibromyalgia    GERD (gastroesophageal reflux disease)    Heart murmur    History of blood transfusion 1992   "after subcutaneous mastectomies"   History of hiatal hernia    Hypertension    not on any medications   Hypothyroidism    Malignant melanoma of left ankle (HCC) 2001   Migraine    "visual; 2-3 times/year" (05/26/2016)   Mitral valve prolapse    PONV (postoperative nausea and vomiting)    Presence of permanent cardiac pacemaker    Medtronic   Ventricular tachycardia (HCC)    Hx of, controlled on sotalol therapy    ROS:   All systems reviewed and negative except as noted in the HPI.   Past Surgical History:  Procedure Laterality Date   ABDOMINAL HYSTERECTOMY  1998   ANTERIOR CERVICAL DECOMP/DISCECTOMY FUSION  2001   ATRIAL FIBRILLATION ABLATION N/A 09/12/2021   Procedure: ATRIAL FIBRILLATION ABLATION;  Surgeon:  Regan Lemming, MD;  Location: [facility] INVASIVE CV LAB;  Service: Cardiovascular;  Laterality: N/A;   BASAL CELL CARCINOMA EXCISION  2001   "cut it out & did a flap, on forehead between my eyebrows"   BREAST IMPLANT EXCHANGE Bilateral 2001   [id number]   BREAST IMPLANT EXCHANGE Left 05/05/2021   Procedure: BREAST IMPLANT EXCHANGE;  Surgeon: Peggye Form, DO;  Location: [facility] OR;  Service: Plastics;  Laterality: Left;   BREAST IMPLANT REMOVAL Right 02/05/2020   Procedure: REMOVAL RIGHT BREAST IMPLANT AND CAPSULECTOMY;  Surgeon: Griselda Miner, MD;  Location: [facility];  Service: General;  Laterality: Right;   BREAST LUMPECTOMY Right 02/05/2020   Procedure: RIGHT BREAST CENTRAL LUMPECTOMY;  Surgeon: Griselda Miner, MD;  Location: [facility];  Service: General;  Laterality: Right;   BREAST RECONSTRUCTION WITH PLACEMENT OF TISSUE EXPANDER AND FLEX HD (ACELLULAR HYDRATED DERMIS) Right 01/20/2021   Procedure: BREAST RECONSTRUCTION WITH PLACEMENT OF TISSUE EXPANDER AND FLEX HD (ACELLULAR HYDRATED DERMIS);  Surgeon: Peggye Form, DO;  Location: [facility] OR;  Service: Plastics;  Laterality: Right;  2 hours   CARPAL TUNNEL RELEASE Right 2006   COLONOSCOPY     DILATION AND CURETTAGE OF UTERUS  1970s X 2-3   ELECTROPHYSIOLOGIC STUDY  1994 X 2;2001   "to see it it was sustained VT; cause thyroid levels were causing arrhythmias"   EYE SURGERY  12/2020   cataracts   HYSTEROTOMY  1994   ICD IMPLANT N/A 12/28/2022   Procedure: ICD IMPLANT;  Surgeon: [name], MD;  Location[facility] INVASIVE CV LAB;  Service: Cardiovascular;  Laterality: N/A;   LAPAROSCOPIC CHOLECYSTECTOMY  2004   MASTECTOMY Bilateral 1992   "subcutaneous"   MELANOMA EXCISION Left 2001   "ankle, stage I"   MITRAL VALVE REPAIR N/A 12/07/2023   Procedure: MITRAL VALVE REPAIR USING SIMULUS SEMI-RIGID ANNULOPLASTY BAND SIZE ;  Surgeon: Eugenio Hoes, MD;  Location: [facility] OR;  Service: Open Heart  Surgery;  Laterality: N/A;   PLACEMENT OF BREAST IMPLANTS Bilateral 1992   [id number]   PVC ABLATION N/A 12/23/2022   Procedure: PVC ABLATION;  Surgeon: Regan Lemming, MD;  [facility]: [facility];  Service: Cardiovascular;  Laterality: N/A;   REMOVAL OF TISSUE EXPANDER AND PLACEMENT OF IMPLANT Right 05/05/2021   Procedure: REMOVAL OF TISSUE EXPANDER AND PLACEMENT OF IMPLANT;  Surgeon: Peggye Form, DO;  Location: [facility] OR;  Service: Plastics;  Laterality: Right;   RIGHT/LEFT HEART CATH AND CORONARY ANGIOGRAPHY N/A 11/11/2023   Procedure: RIGHT/LEFT HEART CATH AND CORONARY ANGIOGRAPHY;  Surgeon: Kathleene Hazel, MD;  [facility]: [facility];  Service: Cardiovascular;  Laterality: N/A;   TEE WITHOUT CARDIOVERSION N/A 12/07/2023   Procedure: TRANSESOPHAGEAL ECHOCARDIOGRAM (TEE);  Surgeon: Eugenio Hoes, MD;  Location: [facility] OR;  Service: Open Heart Surgery;  Laterality: N/A;   TOTAL THYROIDECTOMY  12/1999   "cancer"   TRANSESOPHAGEAL ECHOCARDIOGRAM (CATH LAB) N/A 10/06/2023   Procedure: TRANSESOPHAGEAL ECHOCARDIOGRAM;  Surgeon: Tessa Lerner, DO;  Location: [facility];  Service: Cardiovascular;  Laterality: N/A;   VENTRICULAR ABLATION SURGERY  2011   ventricular tchycardia     Family History  Problem Relation Age of Onset   Other Brother        AGENT ORANGE and AntiLupus   Heart disease Brother        Stents and bypass x2   Arrhythmia Brother        AFIB   Heart attack Brother  Hypertension Brother    Squamous cell carcinoma Brother 20       Skin   Hyperlipidemia Mother 19   Osteoporosis Mother    Basal cell carcinoma Mother 71   Heart disease Father 83   Other Father        Cardiac arrest   Breast cancer Maternal Grandmother 24       metastatic   Breast cancer Other        dx. unknown age; maternal great-aunt   Lung cancer Maternal Aunt 59       hx. of smoking   Other Paternal Aunt        bilateral mastectomies due to multiple breast lumps    Leukemia Maternal Aunt    Cancer Maternal Aunt        unknown types   Thyroid cancer Neg Hx      Social History   Socioeconomic History   Marital status: Married    Spouse name: Education officer, community (Spouse)   Number of children: Not on file   Years of education: Not on file   Highest education level: Not on file  Occupational History   Occupation: Adult nurse GI    Employer: [employer]  Tobacco Use   Smoking status: Never    Passive exposure: Never   Smokeless tobacco: Never  Vaping Use   Vaping status: Never Used  Substance and Sexual Activity   Alcohol use: No   Drug use: No   Sexual activity: Yes  Other Topics Concern   Not on file  Social History Narrative   Married   Social Drivers of Health   Financial Resource Strain: Not on file  Food Insecurity: No Food Insecurity (12/07/2023)   Hunger Vital Sign    Worried About Running Out of Food in the Last Year: Never true    Ran Out of Food in the Last Year: Never true  Transportation Needs: No Transportation Needs (12/07/2023)   PRAPARE - Administrator, Civil Service (Medical): No    Lack of Transportation (Non-Medical): No  Physical Activity: Not on file  Stress: Not on file  Social Connections: Socially Integrated (12/07/2023)   Social Connection and Isolation Panel [NHANES]    Frequency of Communication with Friends and Family: More than three times a week    Frequency of Social Gatherings with Friends and Family: More than three times a week    Attends Religious Services: More than 4 times per year    Active Member of [employer] or Organizations: Yes    Attends Engineer, structural: More than 4 times per year    Marital Status: Married  Catering manager Violence: Not At Risk (12/07/2023)   Humiliation, Afraid, Rape, and Kick questionnaire    Fear of Current or Ex-Partner: No    Emotionally Abused: No    Physically Abused: No    Sexually Abused: No     BP (!) 148/70   Pulse 93   Ht 5\' 4"   (1.626 m)   Wt 147 lb 12.8 oz (67 kg)   SpO2 98%   BMI 25.37 kg/m   Physical Exam:  Well appearing NAD HEENT: Unremarkable Neck:  No JVD, no thyromegally Lymphatics:  No adenopathy Back:  No CVA tenderness Lungs:  Clear with no wheezes HEART:  Regular rate rhythm, no murmurs, no rubs, no clicks Abd:  soft, positive bowel sounds, no organomegally, no rebound, no guarding Ext:  2 plus pulses, no edema, no cyanosis, no clubbing Skin:  No rashes no nodules Neuro:  CN II through XII intact, motor grossly intact  EKG - nsr  DEVICE  Normal device function.  See PaceArt for details.   Assess/Plan:  1.PAF - she is maintaining NSR on sotalol. 2. PVC's - she appears to have minimal PVC's on sotalol 3. Dyspnea - unclear if she has developed any LV dysfunction by echo. I have asked her to undergo 2D echo.  4. HTN - her bp is mildly elevated. We will not agressively try to lower it further.    Sharlot Gowda Macenzie Burford,MD

## 2023-12-30 NOTE — Patient Instructions (Addendum)
 Medication Instructions:  Your physician recommends that you continue on your current medications as directed. Please refer to the Current Medication list given to you today.  *If you need a refill on your cardiac medications before your next appointment, please call your pharmacy*  Lab Work: BMP next week  You may go to any [facility] Location for your lab work:  [facility] - [address] ([facility]) - [address] [phone] [address]  [place] - [address]   [place]  - [address]   [place] - [address] - [address] [employer]  - [address] [place] - [address] ([facility]   If you have labs (blood work) drawn today and your tests are completely normal, you will receive your results only by: [employer] (if you have MyChart)  If you have any lab test that is abnormal or we need to change your treatment, we will call you or send a MyChart message to review the results.  Testing/Procedures: None ordered.  Follow-Up: At [facility], you and your health needs are our priority.  As part of our continuing mission to provide you with exceptional heart care, we have created designated Provider Care Teams.  These Care Teams include your primary Cardiologist (physician) and Advanced Practice Providers (APPs -  Physician Assistants and Nurse Practitioners) who all work together to provide you with the care you need, when you need it.  We recommend signing up for the patient portal called "MyChart".  Sign up information is provided on this After Visit Summary.  MyChart is used to connect with patients for Virtual Visits (Telemedicine).  Patients are able to view lab/test results, encounter notes, upcoming appointments, etc.  Non-urgent messages can be sent to your provider as well.   To learn more about what you can do with MyChart, go to  [email or web address].    Your next appointment:   August 2025  The format for your next appointment:   In Person  Provider:   Lewayne Bunting, MD{or one of the following Advanced Practice Providers on your designated Care Team:   Francis Dowse, [place] Casimiro Needle "Mardelle Matte" Hecker, [place] Earnest Rosier, NP  Note: Remote monitoring is used to monitor your Pacemaker/ ICD from home. This monitoring reduces the number of office visits required to check your device to one time per year. It allows [place] to keep an eye on the functioning of your device to ensure it is working properly.            Valet parking services will be available as well.

## 2023-12-31 ENCOUNTER — Other Ambulatory Visit (HOSPITAL_COMMUNITY): Payer: Self-pay

## 2023-12-31 ENCOUNTER — Other Ambulatory Visit: Payer: Self-pay

## 2024-01-03 DIAGNOSIS — R110 Nausea: Secondary | ICD-10-CM | POA: Diagnosis not present

## 2024-01-03 DIAGNOSIS — R1084 Generalized abdominal pain: Secondary | ICD-10-CM | POA: Diagnosis not present

## 2024-01-03 DIAGNOSIS — D539 Nutritional anemia, unspecified: Secondary | ICD-10-CM | POA: Diagnosis not present

## 2024-01-03 DIAGNOSIS — R0609 Other forms of dyspnea: Secondary | ICD-10-CM | POA: Diagnosis not present

## 2024-01-05 ENCOUNTER — Telehealth: Payer: Self-pay | Admitting: Internal Medicine

## 2024-01-05 ENCOUNTER — Inpatient Hospital Stay (HOSPITAL_BASED_OUTPATIENT_CLINIC_OR_DEPARTMENT_OTHER)
Admission: EM | Admit: 2024-01-05 | Discharge: 2024-01-07 | DRG: 206 | Disposition: A | Discharge status: Home / Self Care | Attending: Internal Medicine | Admitting: Internal Medicine

## 2024-01-05 ENCOUNTER — Encounter (HOSPITAL_BASED_OUTPATIENT_CLINIC_OR_DEPARTMENT_OTHER): Payer: Self-pay | Admitting: Emergency Medicine

## 2024-01-05 ENCOUNTER — Other Ambulatory Visit: Payer: Self-pay

## 2024-01-05 ENCOUNTER — Emergency Department (HOSPITAL_BASED_OUTPATIENT_CLINIC_OR_DEPARTMENT_OTHER)

## 2024-01-05 DIAGNOSIS — Z9882 Breast implant status: Secondary | ICD-10-CM

## 2024-01-05 DIAGNOSIS — J9811 Atelectasis: Secondary | ICD-10-CM | POA: Diagnosis not present

## 2024-01-05 DIAGNOSIS — Z95810 Presence of automatic (implantable) cardiac defibrillator: Secondary | ICD-10-CM | POA: Diagnosis not present

## 2024-01-05 DIAGNOSIS — C50111 Malignant neoplasm of central portion of right female breast: Secondary | ICD-10-CM

## 2024-01-05 DIAGNOSIS — I493 Ventricular premature depolarization: Secondary | ICD-10-CM | POA: Diagnosis not present

## 2024-01-05 DIAGNOSIS — I480 Paroxysmal atrial fibrillation: Secondary | ICD-10-CM | POA: Diagnosis not present

## 2024-01-05 DIAGNOSIS — I059 Rheumatic mitral valve disease, unspecified: Secondary | ICD-10-CM | POA: Diagnosis present

## 2024-01-05 DIAGNOSIS — Z808 Family history of malignant neoplasm of other organs or systems: Secondary | ICD-10-CM

## 2024-01-05 DIAGNOSIS — Z9011 Acquired absence of right breast and nipple: Secondary | ICD-10-CM

## 2024-01-05 DIAGNOSIS — I472 Ventricular tachycardia, unspecified: Secondary | ICD-10-CM | POA: Diagnosis present

## 2024-01-05 DIAGNOSIS — D630 Anemia in neoplastic disease: Secondary | ICD-10-CM | POA: Diagnosis present

## 2024-01-05 DIAGNOSIS — Z884 Allergy status to anesthetic agent status: Secondary | ICD-10-CM

## 2024-01-05 DIAGNOSIS — Z79899 Other long term (current) drug therapy: Secondary | ICD-10-CM

## 2024-01-05 DIAGNOSIS — Z91048 Other nonmedicinal substance allergy status: Secondary | ICD-10-CM

## 2024-01-05 DIAGNOSIS — I10 Essential (primary) hypertension: Secondary | ICD-10-CM | POA: Diagnosis present

## 2024-01-05 DIAGNOSIS — E7801 Familial hypercholesterolemia: Secondary | ICD-10-CM | POA: Diagnosis not present

## 2024-01-05 DIAGNOSIS — Z83438 Family history of other disorder of lipoprotein metabolism and other lipidemia: Secondary | ICD-10-CM

## 2024-01-05 DIAGNOSIS — Z806 Family history of leukemia: Secondary | ICD-10-CM

## 2024-01-05 DIAGNOSIS — R0600 Dyspnea, unspecified: Principal | ICD-10-CM

## 2024-01-05 DIAGNOSIS — Z170 Estrogen receptor positive status [ER+]: Secondary | ICD-10-CM

## 2024-01-05 DIAGNOSIS — F32A Depression, unspecified: Secondary | ICD-10-CM | POA: Diagnosis present

## 2024-01-05 DIAGNOSIS — F419 Anxiety disorder, unspecified: Secondary | ICD-10-CM | POA: Diagnosis present

## 2024-01-05 DIAGNOSIS — Z801 Family history of malignant neoplasm of trachea, bronchus and lung: Secondary | ICD-10-CM

## 2024-01-05 DIAGNOSIS — Z803 Family history of malignant neoplasm of breast: Secondary | ICD-10-CM

## 2024-01-05 DIAGNOSIS — J90 Pleural effusion, not elsewhere classified: Principal | ICD-10-CM | POA: Diagnosis present

## 2024-01-05 DIAGNOSIS — Z853 Personal history of malignant neoplasm of breast: Secondary | ICD-10-CM

## 2024-01-05 DIAGNOSIS — Z85820 Personal history of malignant melanoma of skin: Secondary | ICD-10-CM

## 2024-01-05 DIAGNOSIS — E890 Postprocedural hypothyroidism: Secondary | ICD-10-CM | POA: Diagnosis present

## 2024-01-05 DIAGNOSIS — K219 Gastro-esophageal reflux disease without esophagitis: Secondary | ICD-10-CM | POA: Diagnosis present

## 2024-01-05 DIAGNOSIS — E785 Hyperlipidemia, unspecified: Secondary | ICD-10-CM | POA: Diagnosis present

## 2024-01-05 DIAGNOSIS — Z952 Presence of prosthetic heart valve: Secondary | ICD-10-CM

## 2024-01-05 DIAGNOSIS — Z888 Allergy status to other drugs, medicaments and biological substances status: Secondary | ICD-10-CM

## 2024-01-05 DIAGNOSIS — Z7982 Long term (current) use of aspirin: Secondary | ICD-10-CM

## 2024-01-05 DIAGNOSIS — Z85828 Personal history of other malignant neoplasm of skin: Secondary | ICD-10-CM

## 2024-01-05 DIAGNOSIS — Z9889 Other specified postprocedural states: Secondary | ICD-10-CM | POA: Diagnosis not present

## 2024-01-05 DIAGNOSIS — R0602 Shortness of breath: Secondary | ICD-10-CM | POA: Diagnosis not present

## 2024-01-05 DIAGNOSIS — J9589 Other postprocedural complications and disorders of respiratory system, not elsewhere classified: Principal | ICD-10-CM | POA: Diagnosis present

## 2024-01-05 DIAGNOSIS — Z8051 Family history of malignant neoplasm of kidney: Secondary | ICD-10-CM

## 2024-01-05 DIAGNOSIS — Z7901 Long term (current) use of anticoagulants: Secondary | ICD-10-CM

## 2024-01-05 DIAGNOSIS — I340 Nonrheumatic mitral (valve) insufficiency: Secondary | ICD-10-CM | POA: Diagnosis present

## 2024-01-05 DIAGNOSIS — Z9013 Acquired absence of bilateral breasts and nipples: Secondary | ICD-10-CM

## 2024-01-05 DIAGNOSIS — Z8249 Family history of ischemic heart disease and other diseases of the circulatory system: Secondary | ICD-10-CM

## 2024-01-05 DIAGNOSIS — Z880 Allergy status to penicillin: Secondary | ICD-10-CM

## 2024-01-05 DIAGNOSIS — Z8262 Family history of osteoporosis: Secondary | ICD-10-CM

## 2024-01-05 DIAGNOSIS — M797 Fibromyalgia: Secondary | ICD-10-CM | POA: Diagnosis present

## 2024-01-05 DIAGNOSIS — R110 Nausea: Secondary | ICD-10-CM | POA: Diagnosis present

## 2024-01-05 DIAGNOSIS — Z85850 Personal history of malignant neoplasm of thyroid: Secondary | ICD-10-CM

## 2024-01-05 DIAGNOSIS — R0609 Other forms of dyspnea: Secondary | ICD-10-CM | POA: Diagnosis present

## 2024-01-05 LAB — BRAIN NATRIURETIC PEPTIDE: B Natriuretic Peptide: 304.4 pg/mL — ABNORMAL HIGH (ref 0.0–100.0)

## 2024-01-05 LAB — COMPREHENSIVE METABOLIC PANEL
ALT: 25 U/L (ref 0–44)
AST: 29 U/L (ref 15–41)
Albumin: 3.9 g/dL (ref 3.5–5.0)
Alkaline Phosphatase: 110 U/L (ref 38–126)
Anion gap: 10 (ref 5–15)
BUN: 14 mg/dL (ref 8–23)
CO2: 25 mmol/L (ref 22–32)
Calcium: 9.4 mg/dL (ref 8.9–10.3)
Chloride: 100 mmol/L (ref 98–111)
Creatinine, Ser: 0.57 mg/dL (ref 0.44–1.00)
GFR, Estimated: >60 mL/min (ref >60)
Glucose, Bld: 123 mg/dL — ABNORMAL HIGH (ref 70–99)
Potassium: 4.2 mmol/L (ref 3.5–5.1)
Sodium: 135 mmol/L (ref 135–145)
Total Bilirubin: 0.4 mg/dL (ref 0.0–1.2)
Total Protein: 7.4 g/dL (ref 6.5–8.1)

## 2024-01-05 LAB — URINALYSIS, W/ REFLEX TO CULTURE (INFECTION SUSPECTED)
Bacteria, UA: NONE SEEN
Bilirubin Urine: NEGATIVE
Glucose, UA: NEGATIVE mg/dL
Hgb urine dipstick: NEGATIVE
Ketones, ur: NEGATIVE mg/dL
Leukocytes,Ua: NEGATIVE
Nitrite: NEGATIVE
Protein, ur: NEGATIVE mg/dL
Specific Gravity, Urine: 1.005 (ref 1.005–1.030)
pH: 5.5 (ref 5.0–8.0)

## 2024-01-05 LAB — CBC
HCT: 36.5 % (ref 36.0–46.0)
Hemoglobin: 11.9 g/dL — ABNORMAL LOW (ref 12.0–15.0)
MCH: 30.1 pg (ref 26.0–34.0)
MCHC: 32.6 g/dL (ref 30.0–36.0)
MCV: 92.4 fL (ref 80.0–100.0)
Platelets: 407 10*3/uL — ABNORMAL HIGH (ref 150–400)
RBC: 3.95 MIL/uL (ref 3.87–5.11)
RDW: 13.7 % (ref 11.5–15.5)
WBC: 9.5 10*3/uL (ref 4.0–10.5)
nRBC: 0 % (ref 0.0–0.2)

## 2024-01-05 LAB — TROPONIN I (HIGH SENSITIVITY)
Troponin I (High Sensitivity): 7 ng/L (ref <18)
Troponin I (High Sensitivity): 7 ng/L (ref <18)

## 2024-01-05 LAB — LIPASE, BLOOD: Lipase: 30 U/L (ref 11–51)

## 2024-01-05 MED ORDER — ACETAMINOPHEN 325 MG PO TABS
650.0000 mg | ORAL_TABLET | Freq: Four times a day (QID) | ORAL | Status: DC | PRN
  Administered 2024-01-05 – 2024-01-07 (×3): 650 mg via ORAL
  Filled 2024-01-05 (×3): qty 2

## 2024-01-05 MED ORDER — PANTOPRAZOLE SODIUM 40 MG PO TBEC
40.0000 mg | DELAYED_RELEASE_TABLET | Freq: Every day | ORAL | Status: DC
  Administered 2024-01-06 – 2024-01-07 (×2): 40 mg via ORAL
  Filled 2024-01-05 (×2): qty 1

## 2024-01-05 MED ORDER — FUROSEMIDE 10 MG/ML IJ SOLN
40.0000 mg | Freq: Once | INTRAMUSCULAR | Status: AC
  Administered 2024-01-05: 40 mg via INTRAVENOUS
  Filled 2024-01-05: qty 4

## 2024-01-05 MED ORDER — SODIUM CHLORIDE 0.9% FLUSH
3.0000 mL | Freq: Two times a day (BID) | INTRAVENOUS | Status: DC
  Administered 2024-01-05 – 2024-01-07 (×4): 3 mL via INTRAVENOUS

## 2024-01-05 MED ORDER — ESCITALOPRAM OXALATE 10 MG PO TABS
20.0000 mg | ORAL_TABLET | Freq: Every day | ORAL | Status: DC
  Administered 2024-01-06 – 2024-01-07 (×2): 20 mg via ORAL
  Filled 2024-01-05 (×2): qty 2

## 2024-01-05 MED ORDER — FAMOTIDINE 20 MG PO TABS: 20.0000 mg | ORAL_TABLET | Freq: Two times a day (BID) | ORAL | Status: DC | PRN

## 2024-01-05 MED ORDER — ACETAMINOPHEN 650 MG RE SUPP: 650.0000 mg | Freq: Four times a day (QID) | RECTAL | Status: DC | PRN

## 2024-01-05 MED ORDER — SIMETHICONE 80 MG PO CHEW: 250.0000 mg | CHEWABLE_TABLET | Freq: Every day | ORAL | Status: DC | PRN

## 2024-01-05 MED ORDER — ASPIRIN 81 MG PO TBEC
81.0000 mg | DELAYED_RELEASE_TABLET | Freq: Every day | ORAL | Status: DC
  Administered 2024-01-06 – 2024-01-07 (×2): 81 mg via ORAL
  Filled 2024-01-05 (×2): qty 1

## 2024-01-05 MED ORDER — MELATONIN 5 MG PO TABS: 10.0000 mg | ORAL_TABLET | Freq: Every evening | ORAL | Status: DC | PRN

## 2024-01-05 MED ORDER — THYROID 60 MG PO TABS
120.0000 mg | ORAL_TABLET | Freq: Every day | ORAL | Status: DC
  Administered 2024-01-06: 120 mg via ORAL
  Filled 2024-01-05 (×2): qty 2

## 2024-01-05 MED ORDER — SOTALOL HCL 80 MG PO TABS
160.0000 mg | ORAL_TABLET | Freq: Two times a day (BID) | ORAL | Status: DC
  Administered 2024-01-05 – 2024-01-07 (×4): 160 mg via ORAL
  Filled 2024-01-05 (×5): qty 2

## 2024-01-05 MED ORDER — APIXABAN 5 MG PO TABS
5.0000 mg | ORAL_TABLET | Freq: Two times a day (BID) | ORAL | Status: DC
  Administered 2024-01-06 – 2024-01-07 (×3): 5 mg via ORAL
  Filled 2024-01-05 (×4): qty 1

## 2024-01-05 MED ORDER — POLYETHYLENE GLYCOL 3350 17 G PO PACK: 17.0000 g | PACK | Freq: Every day | ORAL | Status: DC | PRN

## 2024-01-05 MED ORDER — EZETIMIBE 10 MG PO TABS
10.0000 mg | ORAL_TABLET | Freq: Every day | ORAL | Status: DC
  Administered 2024-01-06 – 2024-01-07 (×2): 10 mg via ORAL
  Filled 2024-01-05 (×2): qty 1

## 2024-01-05 MED ORDER — FAMOTIDINE 20 MG PO TABS: 20.0000 mg | ORAL_TABLET | ORAL | Status: DC | PRN

## 2024-01-05 MED ORDER — DICYCLOMINE HCL 10 MG PO CAPS: 10.0000 mg | ORAL_CAPSULE | Freq: Four times a day (QID) | ORAL | Status: DC | PRN

## 2024-01-05 MED ORDER — ALPRAZOLAM 0.5 MG PO TABS: 0.5000 mg | ORAL_TABLET | Freq: Every evening | ORAL | Status: DC | PRN

## 2024-01-05 NOTE — Telephone Encounter (Signed)
 Pt called  to advise that she was going to [facility] due to not doing any better since last ov w/ Dr Ladona Ridgel. SOB

## 2024-01-05 NOTE — ED Notes (Signed)
 [facility] at bedside

## 2024-01-05 NOTE — Plan of Care (Signed)

## 2024-01-05 NOTE — ED Notes (Signed)
 EKG changes provided to Allen,MD.

## 2024-01-05 NOTE — ED Provider Notes (Signed)
 [place] EMERGENCY DEPARTMENT AT [facility] Provider Note   CSN: [id number] Arrival date & time: 01/05/24  1218     History  Chief Complaint  Patient presents with   Abdominal Pain   Post-op Problem    Dalissa Lovin is a 78 y.o. female.  78 year old female presents with increasing dyspnea exertion as well as some orthopnea.  Patient had recent mitral valve surgery.  She is on Eliquis.  States that she had an x-ray recently which showed a left-sided pleural effusion.  Notes compliance with her Eliquis.  Denies any fever or chills.  No recent cough.  Some nausea.  States that when she tries to activity her symptoms become worse.  Called her doctor and told to come here       Home Medications Prior to Admission medications   Medication Sig Start Date End Date Taking? Authorizing Provider  acetaminophen (TYLENOL) 500 MG tablet Take 1,000 mg by mouth every 6 (six) hours as needed for mild pain or moderate pain.    [provider]  ALPRAZolam Prudy Feeler) 0.5 MG tablet Take 1 tablet (0.5 mg total) by mouth at bedtime as needed for sleep 12/23/23     apixaban (ELIQUIS) 5 MG TABS tablet Take 1 tablet (5 mg total) by mouth 2 (two) times daily. 10/29/23   [name], MD  Ascorbic Acid (VITAMIN C) 1000 MG tablet Take 1,000 mg by mouth daily.    [provider]  aspirin EC 81 MG tablet Take 1 tablet (81 mg total) by mouth daily. Swallow whole. 12/13/23   Gold, Wayne E, PA-C  B Complex-C (SUPER B COMPLEX PO) Take 1 capsule by mouth at bedtime.    [provider]  beta carotene 40981 UNIT capsule Take 10,000 Units by mouth at bedtime.    [provider]  Biotin 5 MG CAPS Take 5 mg by mouth at bedtime.     [provider]  Calcium 600-200 MG-UNIT tablet Take 1 tablet by mouth daily.    [provider]  cetirizine (ZYRTEC) 10 MG tablet Take 10 mg by mouth at bedtime.    [provider]  Cholecalciferol (VITAMIN D) 50 MCG  (2000 UT) CAPS Take 2,000 Units by mouth daily.    [provider]  Coenzyme Q10 (COQ-10) 100 MG CAPS Take 100 mg by mouth daily.    [provider]  Digestive Enzyme CAPS Take 1 capsule by mouth daily as needed (heartburn).    [provider]  escitalopram (LEXAPRO) 20 MG tablet Take 1 tablet (20 mg total) by mouth daily. 12/23/22     Evolocumab (REPATHA SURECLICK) 140 MG/ML SOAJ Inject 140 mg into the skin every 14 (fourteen) days. 12/29/23   Swinyer, Zachary George, NP  ezetimibe (ZETIA) 10 MG tablet Take 1 tablet (10 mg total) by mouth daily. 08/12/23     famotidine (PEPCID) 20 MG tablet Take 20 mg by mouth as needed for heartburn or indigestion.    [provider]  ferrous sulfate 325 (65 FE) MG tablet Take 1 tablet (325 mg total) by mouth 2 (two) times daily with a meal. 12/13/23   Gold, Wayne E, PA-C  fluticasone (FLONASE) 50 MCG/ACT nasal spray Place 1 spray into both nostrils daily as needed for allergies. 01/31/18   [provider]  furosemide (LASIX) 20 MG tablet Take one (1) tablet by mouth ( 20 mg) daily as needed for wt gain of 2 lbs in 24 hours or 5 lbs in  one week. 12/23/23   Swinyer, Zachary George, NP  hydrocortisone cream 1 % Apply 1 Application topically daily as needed for itching.    [provider]  MAGNESIUM MALATE PO Take 1,000 mg by mouth daily.    [provider]  MANGANESE PO Take 40 mg by mouth at bedtime.    [provider]  Melatonin 10 MG TABS Take 10 mg by mouth at bedtime.     [provider]  Menatetrenone (VITAMIN K2) 100 MCG TABS Take 100 mcg by mouth daily.    [provider]  methocarbamol (ROBAXIN) 500 MG tablet Take 1 tablet (500 mg total) by mouth every 8 (eight) hours as needed for neck pain 09/29/23     methocarbamol (ROBAXIN) 500 MG tablet Take 1 tablet (500 mg total) by mouth every 8 (eight) hours. as needed for neck pain 12/24/23     omeprazole (PRILOSEC) 40 MG capsule Take 1  capsule (40 mg total) by mouth every morning 30 minutes before breakfast 12/06/23     Polyvinyl Alcohol (LIQUID TEARS OP) Place 1 drop into both eyes daily as needed (dry eyes).    [provider]  potassium chloride SA (KLOR-CON M) 20 MEQ tablet Take one (1) tablet by mouth with lasix. 12/23/23   Swinyer, Zachary George, NP  Simethicone 125 MG CAPS Take 250 mg by mouth daily as needed (bloating).    [provider]  sotalol (BETAPACE) 160 MG tablet Take 1 tablet (160 mg total) by mouth 2 (two) times daily. 10/05/23   Camnitz, Andree Coss, MD  thyroid (ARMOUR) 120 MG tablet Take 120 mg by mouth daily before breakfast.    [provider]  triamcinolone (KENALOG) 0.025 % cream Apply 1 Application topically 2 (two) times daily as needed (rash).    [provider]  zinc gluconate 50 MG tablet Take 50 mg by mouth daily.    [provider]      Allergies    Quinidine, Statins, Tape, Amoxicillin, Atenolol, Dofetilide, Mexiletine hcl, Nadolol, and Vicodin  [hydrocodone-acetaminophen]    Review of Systems   Review of Systems  All other systems reviewed and are negative.   Physical Exam Updated Vital Signs BP 130/72 (BP Location: Right Arm)   Pulse 80   Temp 97.9 F (36.6 C) (Oral)   Resp 18   Ht 1.626 m (5\' 4" )   Wt 67 kg   SpO2 95%   BMI 25.35 kg/m  Physical Exam Vitals and nursing note reviewed.  Constitutional:      General: She is not in acute distress.    Appearance: Normal appearance. She is well-developed. She is not toxic-appearing.  HENT:     Head: Normocephalic and atraumatic.  Eyes:     General: Lids are normal.     Conjunctiva/sclera: Conjunctivae normal.     Pupils: Pupils are equal, round, and reactive to light.  Neck:     Thyroid: No thyroid mass.     Trachea: No tracheal deviation.  Cardiovascular:     Rate and Rhythm: Normal rate and regular rhythm.     Heart sounds: Normal heart sounds. No murmur heard.    No gallop.   Pulmonary:     Effort: Pulmonary effort is normal. No respiratory distress.     Breath sounds: Normal breath sounds. No stridor. No decreased breath sounds, wheezing, rhonchi or rales.  Abdominal:     General: There is no distension.     Palpations: Abdomen is soft.  Tenderness: There is no abdominal tenderness. There is no rebound.  Musculoskeletal:        General: No tenderness. Normal range of motion.     Cervical back: Normal range of motion and neck supple.  Skin:    General: Skin is warm and dry.     Findings: No abrasion or rash.  Neurological:     Mental Status: She is alert and oriented to person, place, and time. Mental status is at baseline.     GCS: GCS eye subscore is 4. GCS verbal subscore is 5. GCS motor subscore is 6.     Cranial Nerves: No cranial nerve deficit.     Sensory: No sensory deficit.     Motor: Motor function is intact.  Psychiatric:        Attention and Perception: Attention normal.        Speech: Speech normal.        Behavior: Behavior normal.     ED Results / Procedures / Treatments   Labs (all labs ordered are listed, but only abnormal results are displayed) Labs Reviewed  LIPASE, BLOOD  COMPREHENSIVE METABOLIC PANEL  CBC  BRAIN NATRIURETIC PEPTIDE  URINALYSIS, W/ REFLEX TO CULTURE (INFECTION SUSPECTED)    EKG EKG Interpretation Date/Time:  Wednesday January 05 2024 12:54:11 EDT Ventricular Rate:  84 PR Interval:  160 QRS Duration:  82 QT Interval:  416 QTC Calculation: 491 R Axis:   73  Text Interpretation: Sinus rhythm with marked sinus arrhythmia T wave abnormality, consider inferior ischemia Abnormal ECG When compared with ECG of 30-Dec-2023 14:21, Premature ventricular complexes are no longer Present No significant change since last tracing Confirmed by Lorre Nick ([id number]) on 01/05/2024 1:17:47 PM  Radiology No results found.  Procedures Procedures    Medications Ordered in ED Medications - No data to display  ED  Course/ Medical Decision Making/ A&P                                 Medical Decision Making Amount and/or Complexity of Data Reviewed Labs: ordered. Radiology: ordered.  Risk Prescription drug management.   Patient presented with shortness of breath concern for possible CHF.  Chest x-ray shows a very large pleural effusion.  Patient is currently on Lasix for suspected CHF as well as Entresto.  Gave patient dose of IV Lasix here.  BNP is also elevated as well.  Troponin is negative.  Her EKG shows no signs of acute coronary ischemia.  Patient may need a thoracentesis that is unclear.  She will require admission will consult hospitalist        Final Clinical Impression(s) / ED Diagnoses Final diagnoses:  None    Rx / DC Orders ED Discharge Orders     None         Lorre Nick, MD 01/05/24 1425

## 2024-01-05 NOTE — H&P (Signed)
 History and Physical   Patricia Alvarado ZOX:[id number] DOB: 1945-11-07 DOA: 01/05/2024  PCP: Patricia Party, NP   Patient coming from: Home  Chief Complaint: Dyspnea on exertion, orthopnea  HPI: Patricia Alvarado is a 78 y.o. female with medical history significant of hypertension, hyperlipidemia, atrial fibrillation, hypothyroidism, PVCs, ventricular tachycardia status post ICD/pacemaker, mitral valve regurgitation status post mitral valve repair, breast cancer, fibromyalgia presenting with worsening dyspnea on exertion and orthopnea.  Patient has had some issues with dyspnea exertion and orthopnea for the past 2 weeks.  She has been placed on Lasix as needed for weight gain of 2 pounds in 24 hours or 5 pounds in 7 days.  She had recent x-ray noticing a pleural effusion on the left. There was mention of need to evaluate for possible LV dysfunction at recent cardiology visit.  She is status post right total valve repair on 2/11.  She had severe mitral valve regurgitation due to myxomatous valve.  She denies fevers, chills, chest pain, abdominal pain, constipation, diarrhea, nausea, vomiting.  ED Course: Vital signs in the ED stable.  Lab workup notable for CMP with glucose of 123.  CBC with hemoglobin stable 11.9, platelets 4 7.  BNP elevated at 304.  Lipase normal.  Troponin normal.  Urinalysis pending.  Chest x-ray with large left pleural effusion.  Patient received Lasix in the ED.  Review of Systems: As per HPI otherwise all other systems reviewed and are negative.  Past Medical History:  Diagnosis Date   A-fib [facility])    AICD (automatic cardioverter/defibrillator) present    Medtr[employer]. Managed by Dr. Lewayne Bunting   Anxiety    Arthritis    thumb   Basal cell carcinoma 2001   "forehead, between eyebrows" right arm (2022)   Breast cancer (HCC)    Carcinoma of thyroid gland (HCC) 2001   Chronic lower back pain    "worse is across my hips" (05/26/2016)   Depression    Dyslipidemia     Dyspnea    Dysrhythmia    A. Fib   Family history of breast cancer    Family history of kidney cancer    Family history of leukemia    Family history of nonmelanoma skin cancer    Fibrocystic breast    Fibromyalgia    GERD (gastroesophageal reflux disease)    Heart murmur    History of blood transfusion 1992   "after subcutaneous mastectomies"   History of hiatal hernia    Hypertension    not on any medications   Hypothyroidism    Malignant melanoma of left ankle (HCC) 2001   Migraine    "visual; 2-3 times/year" (05/26/2016)   Mitral valve prolapse    PONV (postoperative nausea and vomiting)    Presence of permanent cardiac pacemaker    Medtronic   Ventricular tachycardia (HCC)    Hx of, controlled on sotalol therapy    Past Surgical History:  Procedure Laterality Date   ABDOMINAL HYSTERECTOMY  1998   ANTERIOR CERVICAL DECOMP/DISCECTOMY FUSION  2001   ATRIAL FIBRILLATION ABLATION N/A 09/12/2021   Procedure: ATRIAL FIBRILLATION ABLATION;  Surgeon: Regan Lemming, MD;  [facility]: [facility];  Service: Cardiovascular;  Laterality: N/A;   BASAL CELL CARCINOMA EXCISION  2001   "cut it out & did a flap, on forehead between my eyebrows"   BREAST IMPLANT EXCHANGE Bilateral 2001   [id number]   BREAST IMPLANT EXCHANGE Left 05/05/2021   Procedure: BREAST IMPLANT EXCHANGE;  Surgeon: Patricia Alvarado,  Patricia Bills, DO;  Location: [facility] OR;  Service: Plastics;  Laterality: Left;   BREAST IMPLANT REMOVAL Right 02/05/2020   Procedure: REMOVAL RIGHT BREAST IMPLANT AND CAPSULECTOMY;  Surgeon: Patricia Miner, MD;  Location: [facility];  Service: General;  Laterality: Right;   BREAST LUMPECTOMY Right 02/05/2020   Procedure: RIGHT BREAST CENTRAL LUMPECTOMY;  Surgeon: Patricia Miner, MD;  Location: [facility];  Service: General;  Laterality: Right;   BREAST RECONSTRUCTION WITH PLACEMENT OF TISSUE EXPANDER AND FLEX HD (ACELLULAR HYDRATED DERMIS) Right 01/20/2021    Procedure: BREAST RECONSTRUCTION WITH PLACEMENT OF TISSUE EXPANDER AND FLEX HD (ACELLULAR HYDRATED DERMIS);  Surgeon: Patricia Form, DO;  Location: [facility];  Service: Plastics;  Laterality: Right;  2 hours   CARPAL TUNNEL RELEASE Right 2006   COLONOSCOPY     DILATION AND CURETTAGE OF UTERUS  1970s X 2-3   ELECTROPHYSIOLOGIC STUDY  1994 X 2;2001   "to see it it was sustained VT; cause thyroid levels were causing arrhythmias"   EYE SURGERY  12/2020   cataracts   HYSTEROTOMY  1994   ICD IMPLANT N/A 12/28/2022   Procedure: ICD IMPLANT;  Surgeon: Patricia Maw, MD;  Location: [facility] INVASIVE CV LAB;  Service: Cardiovascular;  Laterality: N/A;   LAPAROSCOPIC CHOLECYSTECTOMY  2004   MASTECTOMY Bilateral 1992   "subcutaneous"   MELANOMA EXCISION Left 2001   "ankle, stage I"   MITRAL VALVE REPAIR N/A 12/07/2023   Procedure: MITRAL VALVE REPAIR USING SIMULUS SEMI-RIGID ANNULOPLASTY BAND SIZE ;  Surgeon: Eugenio Hoes, MD;  Location: [facility];  Service: Open Heart Surgery;  Laterality: N/A;   PLACEMENT OF BREAST IMPLANTS Bilateral 1992   [id number]   PVC ABLATION N/A 12/23/2022   Procedure: PVC ABLATION;  Surgeon: Regan Lemming, MD;  [facility]: [facility];  Service: Cardiovascular;  Laterality: N/A;   REMOVAL OF TISSUE EXPANDER AND PLACEMENT OF IMPLANT Right 05/05/2021   Procedure: REMOVAL OF TISSUE EXPANDER AND PLACEMENT OF IMPLANT;  Surgeon: Patricia Form, DO;  Location: [facility];  Service: Plastics;  Laterality: Right;   RIGHT/LEFT HEART CATH AND CORONARY ANGIOGRAPHY N/A 11/11/2023   Procedure: RIGHT/LEFT HEART CATH AND CORONARY ANGIOGRAPHY;  Surgeon: Patricia Hazel, MD;  [facility]: [facility];  Service: Cardiovascular;  Laterality: N/A;   TEE WITHOUT CARDIOVERSION N/A 12/07/2023   Procedure: TRANSESOPHAGEAL ECHOCARDIOGRAM (TEE);  Surgeon: Eugenio Hoes, MD;  Location: [facility];  Service: Open Heart Surgery;  Laterality: N/A;   TOTAL THYROIDECTOMY  12/1999    "cancer"   TRANSESOPHAGEAL ECHOCARDIOGRAM (CATH LAB) N/A 10/06/2023   Procedure: TRANSESOPHAGEAL ECHOCARDIOGRAM;  Surgeon: Patricia Lerner, DO;  Location: [facility];  Service: Cardiovascular;  Laterality: N/A;   VENTRICULAR ABLATION SURGERY  2011   ventricular tchycardia    Social History  reports that she has never smoked. She has never been exposed to tobacco smoke. She has never used smokeless tobacco. She reports that she does not drink alcohol and does not use drugs.  Allergies  Allergen Reactions   Quinidine Palpitations    REACTION: increased heart rate   Statins     Muscle pain   Tape     If left on long enough,will tear skin    Amoxicillin Itching and Rash   Atenolol Hives   Dofetilide Other (See Comments)    Elevated QTCs   Mexiletine Hcl Palpitations    Dizzy and extreme tiredness   Nadolol Hives   Vicodin  [Hydrocodone-Acetaminophen]  Swelling    Facial redness and swelling    Family History  Problem Relation Age of Onset   Other Brother        AGENT ORANGE and AntiLupus   Heart disease Brother        Stents and bypass x2   Arrhythmia Brother        AFIB   Heart attack Brother    Hypertension Brother    Squamous cell carcinoma Brother 20       Skin   Hyperlipidemia Mother 24   Osteoporosis Mother    Basal cell carcinoma Mother 30   Heart disease Father 63   Other Father        Cardiac arrest   Breast cancer Maternal Grandmother 64       metastatic   Breast cancer Other        dx. unknown age; maternal great-aunt   Lung cancer Maternal Aunt 80       hx. of smoking   Other Paternal Aunt        bilateral mastectomies due to multiple breast lumps   Leukemia Maternal Aunt    Cancer Maternal Aunt        unknown types   Thyroid cancer Neg Hx   Reviewed admission  Prior to Admission medications   Medication Sig Start Date End Date Taking? Authorizing Provider  dicyclomine (BENTYL) 10 MG capsule Take 10 mg by mouth every 6 (six) hours as needed.  01/03/24  Yes [provider]  acetaminophen (TYLENOL) 500 MG tablet Take 1,000 mg by mouth every 6 (six) hours as needed for mild pain or moderate pain.    [provider]  ALPRAZolam Prudy Feeler) 0.5 MG tablet Take 1 tablet (0.5 mg total) by mouth at bedtime as needed for sleep 12/23/23     apixaban (ELIQUIS) 5 MG TABS tablet Take 1 tablet (5 mg total) by mouth 2 (two) times daily. 10/29/23   Patricia Maw, MD  Ascorbic Acid (VITAMIN C) 1000 MG tablet Take 1,000 mg by mouth daily.    [provider]  aspirin EC 81 MG tablet Take 1 tablet (81 mg total) by mouth daily. Swallow whole. 12/13/23   Gold, Wayne E, PA-C  B Complex-C (SUPER B COMPLEX PO) Take 1 capsule by mouth at bedtime.    [provider]  beta carotene 11914 UNIT capsule Take 10,000 Units by mouth at bedtime.    [provider]  Biotin 5 MG CAPS Take 5 mg by mouth at bedtime.     [provider]  Calcium 600-200 MG-UNIT tablet Take 1 tablet by mouth daily.    [provider]  cetirizine (ZYRTEC) 10 MG tablet Take 10 mg by mouth at bedtime.    [provider]  Cholecalciferol (VITAMIN D) 50 MCG (2000 UT) CAPS Take 2,000 Units by mouth daily.    [provider]  Coenzyme Q10 (COQ-10) 100 MG CAPS Take 100 mg by mouth daily.    [provider]  Digestive Enzyme CAPS Take 1 capsule by mouth daily as needed (heartburn).    [provider]  escitalopram (LEXAPRO) 20 MG tablet Take 1 tablet (20 mg total) by mouth daily. 12/23/22     Evolocumab (REPATHA SURECLICK) 140 MG/ML SOAJ Inject 140 mg into the skin every 14 (fourteen) days. 12/29/23   Swinyer, Zachary George, NP  ezetimibe (ZETIA) 10 MG tablet Take 1 tablet (10 mg total) by mouth daily. 08/12/23     famotidine (PEPCID)  20 MG tablet Take 20 mg by mouth as needed for heartburn or indigestion.    [provider]  ferrous sulfate 325 (65 FE) MG tablet Take 1 tablet (325 mg total) by mouth 2 (two)  times daily with a meal. 12/13/23   Gold, Wayne E, PA-C  fluticasone (FLONASE) 50 MCG/ACT nasal spray Place 1 spray into both nostrils daily as needed for allergies. 01/31/18   [provider]  furosemide (LASIX) 20 MG tablet Take one (1) tablet by mouth ( 20 mg) daily as needed for wt gain of 2 lbs in 24 hours or 5 lbs in one week. 12/23/23   Swinyer, Zachary George, NP  hydrocortisone cream 1 % Apply 1 Application topically daily as needed for itching.    [provider]  MAGNESIUM MALATE PO Take 1,000 mg by mouth daily.    [provider]  MANGANESE PO Take 40 mg by mouth at bedtime.    [provider]  Melatonin 10 MG TABS Take 10 mg by mouth at bedtime.     [provider]  Menatetrenone (VITAMIN K2) 100 MCG TABS Take 100 mcg by mouth daily.    [provider]  methocarbamol (ROBAXIN) 500 MG tablet Take 1 tablet (500 mg total) by mouth every 8 (eight) hours as needed for neck pain 09/29/23     methocarbamol (ROBAXIN) 500 MG tablet Take 1 tablet (500 mg total) by mouth every 8 (eight) hours. as needed for neck pain 12/24/23     omeprazole (PRILOSEC) 40 MG capsule Take 1 capsule (40 mg total) by mouth every morning 30 minutes before breakfast 12/06/23     Polyvinyl Alcohol (LIQUID TEARS OP) Place 1 drop into both eyes daily as needed (dry eyes).    [provider]  potassium chloride SA (KLOR-CON M) 20 MEQ tablet Take one (1) tablet by mouth with lasix. 12/23/23   Swinyer, Zachary George, NP  Simethicone 125 MG CAPS Take 250 mg by mouth daily as needed (bloating).    [provider]  sotalol (BETAPACE) 160 MG tablet Take 1 tablet (160 mg total) by mouth 2 (two) times daily. 10/05/23   Camnitz, Andree Coss, MD  thyroid (ARMOUR) 120 MG tablet Take 120 mg by mouth daily before breakfast.    [provider]  triamcinolone (KENALOG) 0.025 % cream Apply 1 Application topically 2 (two) times daily as needed (rash).    [provider]  zinc gluconate 50 MG tablet Take 50 mg by mouth daily.    [provider]    Physical Exam: Vitals:   01/05/24 1430 01/05/24 1605 01/05/24 1702 01/05/24 1753  BP: 130/87 117/76  129/77  Pulse: 81 87  84  Resp: (!) 28 (!) 28  18  Temp:   98.8 F (37.1 C) 98.3 F (36.8 C)  TempSrc:   Oral Oral  SpO2: 92% 94%    Weight:    64.8 kg  Height:    5' 4.5" (1.638 m)    Physical Exam Constitutional:      General: She is not in acute distress.    Comments: Mildly uncomfortable appearing  HENT:     Head: Normocephalic and atraumatic.     Mouth/Throat:     Mouth: Mucous membranes are moist.     Pharynx: Oropharynx is clear.  Eyes:     Extraocular Movements: Extraocular movements intact.     Pupils: Pupils are equal, round, and reactive to light.  Cardiovascular:  Rate and Rhythm: Normal rate and regular rhythm.     Pulses: Normal pulses.     Heart sounds: Normal heart sounds.  Pulmonary:     Effort: Pulmonary effort is normal. No respiratory distress.     Breath sounds: Examination of the left-middle field reveals decreased breath sounds. Examination of the left-lower field reveals decreased breath sounds. Decreased breath sounds present.  Abdominal:     General: Bowel sounds are normal. There is no distension.     Palpations: Abdomen is soft.     Tenderness: There is no abdominal tenderness.  Musculoskeletal:        General: No swelling or deformity.  Skin:    General: Skin is warm and dry.  Neurological:     General: No focal deficit present.     Mental Status: Mental status is at baseline.     Labs on Admission: I have personally reviewed following labs and imaging studies  CBC: Recent Labs  Lab 01/05/24 1322  WBC 9.5  HGB 11.9*  HCT 36.5  MCV 92.4  PLT 407*    Basic Metabolic Panel: Recent Labs  Lab 01/05/24 1322  NA 135  K 4.2  CL 100  CO2 25  GLUCOSE 123*  BUN 14  CREATININE 0.57  CALCIUM 9.4    GFR: Estimated Creatinine  Clearance: 52.8 mL/min (by C-G formula based on SCr of 0.57 mg/dL).  Liver Function Tests: Recent Labs  Lab 01/05/24 1322  AST 29  ALT 25  ALKPHOS 110  BILITOT 0.4  PROT 7.4  ALBUMIN 3.9    Urine analysis:    Component Value Date/Time   COLORURINE COLORLESS (A) 01/05/2024 1740   APPEARANCEUR CLEAR 01/05/2024 1740   LABSPEC 1.005 01/05/2024 1740   PHURINE 5.5 01/05/2024 1740   GLUCOSEU NEGATIVE 01/05/2024 1740   HGBUR NEGATIVE 01/05/2024 1740   BILIRUBINUR NEGATIVE 01/05/2024 1740   KETONESUR NEGATIVE 01/05/2024 1740   PROTEINUR NEGATIVE 01/05/2024 1740   NITRITE NEGATIVE 01/05/2024 1740   LEUKOCYTESUR NEGATIVE 01/05/2024 1740    Radiological Exams on Admission: DG Chest Port 1 View Result Date: 01/05/2024 CLINICAL DATA:  Shortness of breath. EXAM: PORTABLE CHEST 1 VIEW COMPARISON:  Chest radiograph dated 12/28/2023. FINDINGS: Large left pleural effusion, increased in size since the prior radiograph. There is associated compressive atelectasis of the left lower lobe. Pneumonia or underlying mass is not excluded the right lung is clear. No pneumothorax. Stable cardiac silhouette. Left pectoral AICD device. Median sternotomy wires. No acute osseous pathology. IMPRESSION: Large left pleural effusion, increased in size since the prior radiograph. Electronically Signed   By: Elgie Collard M.D.   On: 01/05/2024 16:19   EKG: Independently reviewed.  Sinus rhythm with significant sinus arrhythmia.  Nonspecific T wave changes.  QTc 491.  Assessment/Plan Principal Problem:   Pleural effusion on left Active Problems:   Postsurgical hypothyroidism   Hyperlipidemia   Depression   VT (ventricular tachycardia) (HCC)   PVC (premature ventricular contraction)   Paroxysmal atrial fibrillation (HCC)   Malignant neoplasm of central portion of right breast in female, estrogen receptor positive (HCC)   Hypertension   S/P mastectomy, right   ICD (implantable cardioverter-defibrillator) in  place   Mitral valve disease   S/P MVR (mitral valve repair)   Left pleural effusion ?New CHF Recent mitral valve repair > Patient presenting with DOE and orthopnea.  Noted to have enlarging left pleural effusion. > This is in the setting of mitral valve repair by cardiothoracic surgery on 2/11. >  She has also had some issues with fluid retention and has been placed on as needed diuretics.  Received diuresis in the ED as BNP was elevated to greater than 300.  Plan for echo outpatient will be moved up to inpatient. > Contacted on-call CT surgery provider who advised that should be okay to go ahead with thoracentesis.  Provider who performed patient's valve repair, Dr. Thomasenia Bottoms, will be in tomorrow and she will be put on his list to see. - Monitor on telemetry overnight - Appreciate cardiothoracic surgery recommendations and assistance - Echocardiogram - Consult to IR for thoracentesis with labs - I's and O's, daily weights  Hypertension - Will be continuing with as needed Lasix  Hyperlipidemia - Continue home Zetia - On Repatha outpatient  Atrial fibrillation > Status post ablation, currently on sotalol. - Continue home sotalol and Eliquis  PVCs Ventricular tachycardia > Is on sotalol as above.  Status post ICD/pacemaker. - Continue sotalol  Mitral valve regurgitation status post repair > As above had this repaired on 2/11 by CT surgery. - Echocardiogram as above - Recommendation by CT surgery as above  Depression - Continue home Lexapro and as needed Xanax  Hypothyroidism - Continue home thyroid Armour  History of breast cancer > Status post lumpectomy/mastectomy and subsequent implants. - On observation, was due to be on anastrozole through 2028 per oncology note in 2022, do not see oncology follow-up since then.  However has followed up with general surgery.  Their most recent note was the end of 2024 and notes that she declines antiestrogens.  DVT  prophylaxis: Eliquis Code Status:   Full Family Communication:  Updated at bedside  Disposition Plan:   Patient is from:  Home  Anticipated DC to:  Home  Anticipated DC date:  1 to 2 days  Anticipated DC barriers: None  Consults called:  Cardiothoracic surgery Admission status:  Observation, telemetry  Severity of Illness: The appropriate patient status for this patient is OBSERVATION. Observation status is judged to be reasonable and necessary in order to provide the required intensity of service to ensure the patient's safety. The patient's presenting symptoms, physical exam findings, and initial radiographic and laboratory data in the context of their medical condition is felt to place them at decreased risk for further clinical deterioration. Furthermore, it is anticipated that the patient will be medically stable for discharge from the hospital within 2 midnights of admission.    Synetta Fail MD [facility]  How to contact the [facility] Attending or Consulting provider 7A - 7P or covering provider during after hours 7P -7A, for this patient?   Check the care team in [facility] and look for a) attending/consulting [facility] provider listed and b) the [facility] team listed Log into [email or web address] and use [facility] universal password to access. If you do not have the password, please contact the hospital operator. Locate the [facility] provider you are looking for under [facility] and page to a number that you can be directly reached. If you still have difficulty reaching the provider, please page the [facility] (Director on Call) for the Hospitalists listed on amion for assistance.  01/05/2024, 7:04 PM

## 2024-01-05 NOTE — ED Notes (Signed)
 Called Thomas at [facility] for transport 16:30

## 2024-01-05 NOTE — ED Triage Notes (Signed)
 Pt c/o abd pain, LT flank pain and lower back pain x 1 week with shob. Endorses nausea and decreased po intake. Recent mitral valve repair 12/07/23.

## 2024-01-06 ENCOUNTER — Observation Stay (HOSPITAL_COMMUNITY)

## 2024-01-06 DIAGNOSIS — Z95810 Presence of automatic (implantable) cardiac defibrillator: Secondary | ICD-10-CM | POA: Diagnosis not present

## 2024-01-06 DIAGNOSIS — Z9013 Acquired absence of bilateral breasts and nipples: Secondary | ICD-10-CM | POA: Diagnosis not present

## 2024-01-06 DIAGNOSIS — M797 Fibromyalgia: Secondary | ICD-10-CM | POA: Diagnosis not present

## 2024-01-06 DIAGNOSIS — Z884 Allergy status to anesthetic agent status: Secondary | ICD-10-CM | POA: Diagnosis not present

## 2024-01-06 DIAGNOSIS — R918 Other nonspecific abnormal finding of lung field: Secondary | ICD-10-CM | POA: Diagnosis not present

## 2024-01-06 DIAGNOSIS — I493 Ventricular premature depolarization: Secondary | ICD-10-CM | POA: Diagnosis not present

## 2024-01-06 DIAGNOSIS — Z48813 Encounter for surgical aftercare following surgery on the respiratory system: Secondary | ICD-10-CM | POA: Diagnosis not present

## 2024-01-06 DIAGNOSIS — I371 Nonrheumatic pulmonary valve insufficiency: Secondary | ICD-10-CM | POA: Diagnosis not present

## 2024-01-06 DIAGNOSIS — E785 Hyperlipidemia, unspecified: Secondary | ICD-10-CM | POA: Diagnosis not present

## 2024-01-06 DIAGNOSIS — I340 Nonrheumatic mitral (valve) insufficiency: Secondary | ICD-10-CM | POA: Diagnosis not present

## 2024-01-06 DIAGNOSIS — C50111 Malignant neoplasm of central portion of right female breast: Secondary | ICD-10-CM | POA: Diagnosis not present

## 2024-01-06 DIAGNOSIS — Z880 Allergy status to penicillin: Secondary | ICD-10-CM | POA: Diagnosis not present

## 2024-01-06 DIAGNOSIS — Z888 Allergy status to other drugs, medicaments and biological substances status: Secondary | ICD-10-CM | POA: Diagnosis not present

## 2024-01-06 DIAGNOSIS — I480 Paroxysmal atrial fibrillation: Secondary | ICD-10-CM | POA: Diagnosis not present

## 2024-01-06 DIAGNOSIS — J9589 Other postprocedural complications and disorders of respiratory system, not elsewhere classified: Secondary | ICD-10-CM | POA: Diagnosis not present

## 2024-01-06 DIAGNOSIS — Z85850 Personal history of malignant neoplasm of thyroid: Secondary | ICD-10-CM | POA: Diagnosis not present

## 2024-01-06 DIAGNOSIS — J90 Pleural effusion, not elsewhere classified: Secondary | ICD-10-CM | POA: Diagnosis present

## 2024-01-06 DIAGNOSIS — D630 Anemia in neoplastic disease: Secondary | ICD-10-CM | POA: Diagnosis not present

## 2024-01-06 DIAGNOSIS — I472 Ventricular tachycardia, unspecified: Secondary | ICD-10-CM | POA: Diagnosis not present

## 2024-01-06 DIAGNOSIS — F32A Depression, unspecified: Secondary | ICD-10-CM | POA: Diagnosis not present

## 2024-01-06 DIAGNOSIS — E890 Postprocedural hypothyroidism: Secondary | ICD-10-CM | POA: Diagnosis not present

## 2024-01-06 DIAGNOSIS — Z79899 Other long term (current) drug therapy: Secondary | ICD-10-CM | POA: Diagnosis not present

## 2024-01-06 DIAGNOSIS — Z7901 Long term (current) use of anticoagulants: Secondary | ICD-10-CM | POA: Diagnosis not present

## 2024-01-06 DIAGNOSIS — Z85820 Personal history of malignant melanoma of skin: Secondary | ICD-10-CM | POA: Diagnosis not present

## 2024-01-06 DIAGNOSIS — Z85828 Personal history of other malignant neoplasm of skin: Secondary | ICD-10-CM | POA: Diagnosis not present

## 2024-01-06 DIAGNOSIS — I10 Essential (primary) hypertension: Secondary | ICD-10-CM | POA: Diagnosis not present

## 2024-01-06 DIAGNOSIS — Z8249 Family history of ischemic heart disease and other diseases of the circulatory system: Secondary | ICD-10-CM | POA: Diagnosis not present

## 2024-01-06 LAB — ECHOCARDIOGRAM LIMITED
AR max vel: 2.38 cm2
AV Area VTI: 1.87 cm2
AV Area mean vel: 1.8 cm2
AV Mean grad: 5 mmHg
AV Peak grad: 7.6 mmHg
Ao pk vel: 1.38 m/s
Area-P 1/2: 2.85 cm2
Calc EF: 48.7 %
Height: 64.5 in
MV VTI: 1.6 cm2
P 1/2 time: 516 ms
S' Lateral: 4.4 cm
Single Plane A2C EF: 48.2 %
Single Plane A4C EF: 45.2 %
Weight: 2303.37 [oz_av]

## 2024-01-06 LAB — LACTATE DEHYDROGENASE, PLEURAL OR PERITONEAL FLUID: LD, Fluid: 121 U/L — ABNORMAL HIGH (ref 3–23)

## 2024-01-06 LAB — CBC
HCT: 31.6 % — ABNORMAL LOW (ref 36.0–46.0)
Hemoglobin: 10.4 g/dL — ABNORMAL LOW (ref 12.0–15.0)
MCH: 30.3 pg (ref 26.0–34.0)
MCHC: 32.9 g/dL (ref 30.0–36.0)
MCV: 92.1 fL (ref 80.0–100.0)
Platelets: 354 10*3/uL (ref 150–400)
RBC: 3.43 MIL/uL — ABNORMAL LOW (ref 3.87–5.11)
RDW: 13.7 % (ref 11.5–15.5)
WBC: 7.6 10*3/uL (ref 4.0–10.5)
nRBC: 0 % (ref 0.0–0.2)

## 2024-01-06 LAB — COMPREHENSIVE METABOLIC PANEL
ALT: 27 U/L (ref 0–44)
AST: 33 U/L (ref 15–41)
Albumin: 2.7 g/dL — ABNORMAL LOW (ref 3.5–5.0)
Alkaline Phosphatase: 88 U/L (ref 38–126)
Anion gap: 8 (ref 5–15)
BUN: 10 mg/dL (ref 8–23)
CO2: 27 mmol/L (ref 22–32)
Calcium: 9 mg/dL (ref 8.9–10.3)
Chloride: 103 mmol/L (ref 98–111)
Creatinine, Ser: 0.59 mg/dL (ref 0.44–1.00)
GFR, Estimated: >60 mL/min (ref >60)
Glucose, Bld: 101 mg/dL — ABNORMAL HIGH (ref 70–99)
Potassium: 3.9 mmol/L (ref 3.5–5.1)
Sodium: 138 mmol/L (ref 135–145)
Total Bilirubin: 0.4 mg/dL (ref 0.0–1.2)
Total Protein: 6.2 g/dL — ABNORMAL LOW (ref 6.5–8.1)

## 2024-01-06 LAB — BODY FLUID CELL COUNT WITH DIFFERENTIAL
Eos, Fluid: 0 %
Lymphs, Fluid: 49 %
Monocyte-Macrophage-Serous Fluid: 30 % — ABNORMAL LOW (ref 50–90)
Neutrophil Count, Fluid: 20 % (ref 0–25)
Total Nucleated Cell Count, Fluid: 760 uL (ref 0–1000)

## 2024-01-06 LAB — GLUCOSE, PLEURAL OR PERITONEAL FLUID: Glucose, Fluid: 120 mg/dL

## 2024-01-06 LAB — PROTEIN, PLEURAL OR PERITONEAL FLUID: Total protein, fluid: 4.2 g/dL

## 2024-01-06 LAB — MAGNESIUM: Magnesium: 2 mg/dL (ref 1.7–2.4)

## 2024-01-06 MED ORDER — LIDOCAINE HCL 1 % IJ SOLN
INTRAMUSCULAR | Status: AC
  Filled 2024-01-06: qty 20

## 2024-01-06 MED ORDER — LORATADINE 10 MG PO TABS
10.0000 mg | ORAL_TABLET | Freq: Every day | ORAL | Status: DC
  Administered 2024-01-06 – 2024-01-07 (×2): 10 mg via ORAL
  Filled 2024-01-06 (×3): qty 1

## 2024-01-06 MED ORDER — LIDOCAINE HCL 1 % IJ SOLN
20.0000 mL | Freq: Once | INTRAMUSCULAR | Status: AC
  Administered 2024-01-06: 10 mL via INTRADERMAL

## 2024-01-06 NOTE — Procedures (Signed)
 PROCEDURE SUMMARY:  Successful image-guided left thoracentesis. Yielded 1.2 liters of clear, dark bloody pleural fluid. Patient tolerated procedure well. EBL: Zero No immediate complications.  Specimen was sent for labs. Post procedure CXR pending.  Please see imaging section of Epic for full dictation.  Sable Feil PA-C 01/06/2024 3:32 PM

## 2024-01-06 NOTE — Hospital Course (Signed)
 77yo with h/o HTN, HLD, afib, hypothyroidism, AICD/pacer, s/p MV repair (12/07/23), breast cancer, and fibromyalgia who presented on 3/12 with DOE x 2 weeks, recently started on Lasix.  CXR with large L pleural effusion.  CT surgery will consult.  Thoracentesis ordered.  Given diuretics.  Echo pending.

## 2024-01-06 NOTE — Progress Notes (Signed)
 Progress Note   Patient: Patricia Alvarado BJY:[id number] DOB: April 06, 1946 DOA: 01/05/2024     0 DOS: the patient was seen and examined on 01/06/2024   Brief hospital course: 78yo with h/o HTN, HLD, afib, hypothyroidism, AICD/pacer, s/p MV repair (12/07/23), breast cancer, and fibromyalgia who presented on 3/12 with DOE x 2 weeks, recently started on Lasix.  CXR with large L pleural effusion.  CT surgery will consult.  Thoracentesis ordered.  Given diuretics.  Echo pending.  Assessment and Plan:  Left pleural effusion Recent mitral valve repair Patient presenting with DOE and orthopnea Noted to have enlarging left pleural effusion This is in the setting of mitral valve repair by cardiothoracic surgery on 2/11 She has also had some issues with fluid retention and has been placed on as needed diuretics Contacted on-call CT surgery provider who advised that should be okay to go ahead with thoracentesis, will consult Monitor on telemetry overnight Appreciate cardiothoracic surgery recommendations and assistance Echocardiogram Consult to IR for thoracentesis with labs   Hypertension No meds other than prn Lasix (holding)   Hyperlipidemia Continue home Zetia On Repatha outpatient   Atrial fibrillation/PVCs/Ventricular tachycardia Status post ablation, currently on sotalol for rate control Continue Eliquis Status post ICD/pacemaker   Mitral valve regurgitation status post repair Repaired on 2/11 by CT surgery Echocardiogram ordered CT surgery is consulting Continue ASA   Depression Continue home Lexapro and as needed Xanax   Hypothyroidism Continue home thyroid Armour   History of breast cancer Status post lumpectomy/mastectomy and subsequent implants. Declined antiestrogens due to fibromyalgia Due to f/u with surgery next month Last mammogram appears to have been in 07/2022     Consultants: CT surgery IR TOC team  Procedures: Thoracentesis 3/13 Echocardiogram  313  Antibiotics: None  30 Day Unplanned Readmission Risk Score    Flowsheet Row Admission (Discharged) from 12/07/2023 in [facility] 4E CV SURGICAL PROGRESSIVE CARE  30 Day Unplanned Readmission Risk Score (%) 13.54 Filed at 12/13/2023 0801       This score is the patient's risk of an unplanned readmission within 30 days of being discharged (0 -100%). The score is based on dignosis, age, lab data, medications, orders, and past utilization.   Low:  0-14.9   Medium: 15-21.9   High: 22-29.9   Extreme: 30 and above           Subjective: Feeling ok but very anxious, does not want to be discharged today.  On RA, reports SOB with exertion, conversation.   Objective: Vitals:   01/06/24 1507 01/06/24 1528  BP: (!) 140/70 101/60  Pulse:    Resp:    Temp:    SpO2:      Intake/Output Summary (Last 24 hours) at 01/06/2024 1702 Last data filed at 01/06/2024 0600 Gross per 24 hour  Intake --  Output 700 ml  Net -700 ml   Filed Weights   01/05/24 1253 01/05/24 1753 01/06/24 0409  Weight: 67 kg 64.8 kg 65.3 kg    Exam:  General:  Appears calm and comfortable and is in NAD, on RA Eyes:   EOMI, normal lids, iris ENT:  grossly normal hearing, lips & tongue, mmm Neck:  no LAD, masses or thyromegaly Cardiovascular:  RRR, no m/r/g. No LE edema.  Respiratory:   Diminished breath sounds along 2/3 of L lung.  Normal respiratory effort. Abdomen:  soft, NT, ND Skin:  no rash or induration seen on limited exam Musculoskeletal:  grossly normal tone BUE/BLE, good ROM, no bony abnormality  Psychiatric:  anxious mood and affect, speech fluent and appropriate, AOx3 Neurologic:  CN 2-12 grossly intact, moves all extremities in coordinated fashion  Data Reviewed: I have reviewed the patient's lab results since admission.  Pertinent labs for today include:   Albumin 2.7 Stable CBC     Family Communication: Husband and preacher were present throughout  Disposition: Status is:  Inpatient Admit - It is my clinical opinion that admission to INPATIENT is reasonable and necessary because of the expectation that this patient will require hospital care that crosses at least 2 midnights to treat this condition based on the medical complexity of the problems presented.  Given the aforementioned information, the predictability of an adverse outcome is felt to be significant.      Time spent: 50 minutes  Unresulted Labs (From admission, onward)     Start     Ordered   01/07/24 0500  CBC with Differential/Platelet  Tomorrow morning,   R       Question:  Specimen collection method  Answer:  Lab=Lab collect   01/06/24 1702   01/07/24 0500  Basic metabolic panel  Tomorrow morning,   R       Question:  Specimen collection method  Answer:  Lab=Lab collect   01/06/24 1702   01/06/24 1532  Body fluid culture w Gram Stain  RELEASE UPON ORDERING,   STAT        01/06/24 1532   01/06/24 1532  Body fluid cell count with differential  RELEASE UPON ORDERING,   TIMED        01/06/24 1532             Author: Jonah Blue, MD 01/06/2024 5:02 PM  For on call review [email or web address].

## 2024-01-06 NOTE — Plan of Care (Signed)
  Problem: Education: Goal: Knowledge of General Education information will improve Description: Including pain rating scale, medication(s)/side effects and non-pharmacologic comfort measures Outcome: Progressing   Problem: Clinical Measurements: Goal: Respiratory complications will improve Outcome: Progressing Goal: Cardiovascular complication will be avoided Outcome: Progressing   Problem: Activity: Goal: Risk for activity intolerance will decrease Outcome: Progressing   Problem: Nutrition: Goal: Adequate nutrition will be maintained Outcome: Progressing   Problem: Elimination: Goal: Will not experience complications related to bowel motility Outcome: Progressing

## 2024-01-06 NOTE — Care Management Obs Status (Signed)
 MEDICARE OBSERVATION STATUS NOTIFICATION   Patient Details  Name: Patricia Alvarado MRN: [id number] Date of Birth: [birth year]   Medicare Observation Status Notification Given:  Yes    Leone Haven, RN 01/06/2024, 2:34 PM

## 2024-01-06 NOTE — Progress Notes (Signed)
      [address]       Patricia Alvarado [id number]             [phone]            Subjective: She was last seen in our office 03/04. At that time, she had some shortness of breath/DOE but seemed to be improving. She was on Lasix PRN (per cardiology). Patient states she developed worsening shortness of breath and orthopnea so presented to ED.  Objective: Vital signs in last 24 hours: Temp:  [97.7 F (36.5 C)-98.8 F (37.1 C)] 97.9 F (36.6 C) (03/13 0409) Pulse Rate:  [71-88] 75 (03/13 0409) Cardiac Rhythm: Normal sinus rhythm;Atrial paced (03/12 2009) Resp:  [16-28] 18 (03/13 0409) BP: (100-131)/(63-95) 131/66 (03/13 0409) SpO2:  [91 %-95 %] 91 % (03/13 0409) Weight:  [64.8 kg-67 kg] 65.3 kg (03/13 0409)   Current Weight  01/06/24 65.3 kg      Intake/Output from previous day: 03/12 0701 - 03/13 0700 In: -  Out: 1900 [Urine:1900]   Physical Exam:  Cardiovascular: RRR, Pulmonary: Clear to auscultation on the right and diminished left basilar breath sounds Extremities: Trace bilateral lower extremity edema. Wound: Clean and dry.  No erythema or signs of infection.  Lab Results: CBC: Recent Labs    01/05/24 1322 01/06/24 0243  WBC 9.5 7.6  HGB 11.9* 10.4*  HCT 36.5 31.6*  PLT 407* 354   BMET:  Recent Labs    01/05/24 1322 01/06/24 0243  NA 135 138  K 4.2 3.9  CL 100 103  CO2 25 27  GLUCOSE 123* 101*  BUN 14 10  CREATININE 0.57 0.59  CALCIUM 9.4 9.0    PT/INR:  Lab Results  Component Value Date   INR 1.5 (H) 12/07/2023   INR 1.0 12/03/2023   INR 1.0 ratio 01/14/2010   ABG:  INR: Will add last result for INR, ABG once components are confirmed Will add last 4 CBG results once components are confirmed  Assessment/Plan:  1. CV - S/p mitral valve repair (Simulus Semi Rigid Band, size 34 mm) 12/07/2023. History of a fib. SR this am. On Sotalol 160 mg bid and Apixaban 5 mg bid. Await echocardiogram. 2.  Pulmonary - Large left  pleural effusion on chest x ray. IR consulted for left thoracentesis. 3. BNP yesterday 304.4. She was on Lasix PRN prior to admission 4.  Anemia - H and H this am decreased to 10.4 and 31.6 5. History of hypothyroidism-continue Armour daily  Karman Veney M ZimmermanPA-C 8:53 AM

## 2024-01-06 NOTE — Plan of Care (Signed)

## 2024-01-06 NOTE — TOC Initial Note (Signed)
 Transition of Care [facility]) - Initial/Assessment Note    Patient Details  Name: Patricia Alvarado MRN: [id number] Date of Birth: 1946/07/14  [facility] CM/SW Contact:    Leone Haven, RN Phone Number: 01/06/2024, 2:45 PM  Clinical Narrative:                 From home with spouse, has PCP and insurance on file, states is active with [facility] for HHPT, HHOT, has cane and walker at home.  States family member will transport them home at [employer] and family is support system,   Pta self ambulatory .  She states she needs an aide more than she needs OT, NCM confirmed with Artavia with [facility], if patient needs an aide they will add an aide and take off the OT.  She is for a thoracetesis today.   Expected Discharge Plan: Home w Home Health Services Barriers to Discharge: Continued Medical Work up   Patient Goals and CMS Choice Patient states their goals for this hospitalization and ongoing recovery are:: return home with spouse   Choice offered to / list presented to : NA      Expected Discharge Plan and Services In-house Referral: NA Discharge Planning Services: CM Consult Post Acute Care Choice: Resumption of Svcs/PTA Provider Living arrangements for the past 2 months: Single Family Home                 DME Arranged: N/A         HH Arranged: PT, Nurse's Aide HH Agency: [facility] ([facility]) Date HH Agency Contacted: 01/06/24 Time HH Agency Contacted: [phone] Representative spoke with at [facility] Agency: Adele Dan  Prior Living Arrangements/Services Living arrangements for the past 2 months: Single Family Home Lives with:: Spouse Patient language and need for interpreter reviewed:: Yes Do you feel safe going back to the place where you live?: Yes      Need for Family Participation in Patient Care: Yes (Comment) Care giver support system in place?: Yes (comment) Current home services: DME (walker, cane) Criminal Activity/Legal Involvement Pertinent to  Current Situation/Hospitalization: No - Comment as needed  Activities of Daily Living   ADL Screening (condition at time of admission) Independently performs ADLs?: Yes (appropriate for developmental age) Is the patient deaf or have difficulty hearing?: No Does the patient have difficulty seeing, even when wearing glasses/contacts?: No Does the patient have difficulty concentrating, remembering, or making decisions?: No  Permission Sought/Granted Permission sought to share information with : Case Manager Permission granted to share information with : Yes, Verbal Permission Granted              Emotional Assessment Appearance:: Appears stated age Attitude/Demeanor/Rapport: Engaged Affect (typically observed): Appropriate Orientation: : Oriented to Self, Oriented to Place, Oriented to  Time, Oriented to Situation Alcohol / Substance Use: Not Applicable Psych Involvement: No (comment)  Admission diagnosis:  Pleural effusion on left [J90] Dyspnea, unspecified type [R06.00] Patient Active Problem List   Diagnosis Date Noted   Pleural effusion on left 01/05/2024   S/P MVR (mitral valve repair) 12/07/2023   Mitral valve disease 10/06/2023   ICD (implantable cardioverter-defibrillator) in place 04/06/2023   Secondary hypercoagulable state (HCC) 09/22/2021   Acquired absence of right breast 11/05/2020   S/P mastectomy, right 11/05/2020   Hypertension 06/20/2020   Pre-procedure lab exam 03/05/2020   Family history of breast cancer    Family history of nonmelanoma skin cancer    Family history of leukemia  Family history of kidney cancer    Malignant neoplasm of central portion of right breast in female, estrogen receptor positive (HCC) 02/15/2020   Breast mass, right 02/05/2020   Afib (HCC) 05/26/2016   Paroxysmal atrial fibrillation (HCC) 05/26/2016   PVC (premature ventricular contraction) 11/27/2015   MELANOMA, LEG, LEFT 12/02/2009   Malignant neoplasm of skin of parts of  face 12/02/2009   CARCINOMA, THYROID GLAND, PAPILLARY 12/02/2009   Postsurgical hypothyroidism 12/02/2009   Depression 12/02/2009   FIBROCYSTIC BREAST DISEASE 12/02/2009   FIBROMYALGIA 12/02/2009   Malignant neoplasm of skin of face 12/02/2009   Hyperlipidemia 08/29/2009   VT (ventricular tachycardia) (HCC) 08/29/2009   PCP:  [name], NP Pharmacy:   [facility] [address] [place] [address] Phone: [phone] Fax: [phone]  [facility] [address] [place] [address] Phone: [phone] Fax: [phone]     Social Drivers of Health (SDOH) Social History: SDOH Screenings   Food Insecurity: No Food Insecurity (01/05/2024)  Housing: Low Risk  (01/05/2024)  Transportation Needs: No Transportation Needs (01/05/2024)  Utilities: Not At Risk (01/05/2024)  Social Connections: Moderately Integrated (01/05/2024)  Tobacco Use: Low Risk  (01/05/2024)   SDOH Interventions:     Readmission Risk Interventions     No data to display

## 2024-01-06 NOTE — Plan of Care (Signed)
  Problem: Education: Goal: Knowledge of General Education information will improve Description: Including pain rating scale, medication(s)/side effects and non-pharmacologic comfort measures 01/06/2024 2052 by Velia Meyer D, RN Outcome: Progressing 01/06/2024 2051 by Debbrah Alar, RN Outcome: Not Progressing   Problem: Health Behavior/Discharge Planning: Goal: Ability to manage health-related needs will improve 01/06/2024 2052 by Debbrah Alar, RN Outcome: Progressing 01/06/2024 2051 by Velia Meyer D, RN Outcome: Not Progressing   Problem: Clinical Measurements: Goal: Ability to maintain clinical measurements within normal limits will improve 01/06/2024 2052 by Debbrah Alar, RN Outcome: Progressing 01/06/2024 2051 by Velia Meyer D, RN Outcome: Not Progressing Goal: Will remain free from infection 01/06/2024 2052 by Velia Meyer D, RN Outcome: Progressing 01/06/2024 2051 by Velia Meyer D, RN Outcome: Not Progressing Goal: Diagnostic test results will improve 01/06/2024 2052 by Debbrah Alar, RN Outcome: Progressing 01/06/2024 2051 by Velia Meyer D, RN Outcome: Not Progressing Goal: Respiratory complications will improve 01/06/2024 2052 by Debbrah Alar, RN Outcome: Progressing 01/06/2024 2051 by Velia Meyer D, RN Outcome: Not Progressing Goal: Cardiovascular complication will be avoided 01/06/2024 2052 by Velia Meyer D, RN Outcome: Progressing 01/06/2024 2051 by Debbrah Alar, RN Outcome: Not Progressing   Problem: Activity: Goal: Risk for activity intolerance will decrease 01/06/2024 2052 by Velia Meyer D, RN Outcome: Progressing 01/06/2024 2051 by Velia Meyer D, RN Outcome: Not Progressing   Problem: Nutrition: Goal: Adequate nutrition will be maintained 01/06/2024 2052 by Velia Meyer D, RN Outcome: Progressing 01/06/2024 2051 by Velia Meyer D, RN Outcome: Not Progressing   Problem:  Coping: Goal: Level of anxiety will decrease 01/06/2024 2052 by Debbrah Alar, RN Outcome: Progressing 01/06/2024 2051 by Velia Meyer D, RN Outcome: Not Progressing   Problem: Elimination: Goal: Will not experience complications related to bowel motility 01/06/2024 2052 by Debbrah Alar, RN Outcome: Progressing 01/06/2024 2051 by Velia Meyer D, RN Outcome: Not Progressing Goal: Will not experience complications related to urinary retention 01/06/2024 2052 by Debbrah Alar, RN Outcome: Progressing 01/06/2024 2051 by Velia Meyer D, RN Outcome: Not Progressing   Problem: Pain Managment: Goal: General experience of comfort will improve and/or be controlled 01/06/2024 2052 by Debbrah Alar, RN Outcome: Progressing 01/06/2024 2051 by Velia Meyer D, RN Outcome: Not Progressing   Problem: Safety: Goal: Ability to remain free from injury will improve 01/06/2024 2052 by Debbrah Alar, RN Outcome: Progressing 01/06/2024 2051 by Velia Meyer D, RN Outcome: Not Progressing   Problem: Skin Integrity: Goal: Risk for impaired skin integrity will decrease 01/06/2024 2052 by Debbrah Alar, RN Outcome: Progressing 01/06/2024 2051 by Debbrah Alar, RN Outcome: Not Progressing

## 2024-01-07 ENCOUNTER — Other Ambulatory Visit (HOSPITAL_COMMUNITY): Payer: Self-pay

## 2024-01-07 ENCOUNTER — Telehealth: Payer: Self-pay

## 2024-01-07 DIAGNOSIS — J90 Pleural effusion, not elsewhere classified: Secondary | ICD-10-CM | POA: Diagnosis not present

## 2024-01-07 LAB — BASIC METABOLIC PANEL
Anion gap: 10 (ref 5–15)
BUN: 8 mg/dL (ref 8–23)
CO2: 25 mmol/L (ref 22–32)
Calcium: 9 mg/dL (ref 8.9–10.3)
Chloride: 103 mmol/L (ref 98–111)
Creatinine, Ser: 0.6 mg/dL (ref 0.44–1.00)
GFR, Estimated: >60 mL/min (ref >60)
Glucose, Bld: 97 mg/dL (ref 70–99)
Potassium: 4 mmol/L (ref 3.5–5.1)
Sodium: 138 mmol/L (ref 135–145)

## 2024-01-07 LAB — CBC WITH DIFFERENTIAL/PLATELET
Abs Immature Granulocytes: 0.03 10*3/uL (ref 0.00–0.07)
Basophils Absolute: 0 10*3/uL (ref 0.0–0.1)
Basophils Relative: 1 %
Eosinophils Absolute: 0.4 10*3/uL (ref 0.0–0.5)
Eosinophils Relative: 6 %
HCT: 34.4 % — ABNORMAL LOW (ref 36.0–46.0)
Hemoglobin: 11.1 g/dL — ABNORMAL LOW (ref 12.0–15.0)
Immature Granulocytes: 0 %
Lymphocytes Relative: 26 %
Lymphs Abs: 1.8 10*3/uL (ref 0.7–4.0)
MCH: 30.2 pg (ref 26.0–34.0)
MCHC: 32.3 g/dL (ref 30.0–36.0)
MCV: 93.5 fL (ref 80.0–100.0)
Monocytes Absolute: 0.6 10*3/uL (ref 0.1–1.0)
Monocytes Relative: 8 %
Neutro Abs: 4.3 10*3/uL (ref 1.7–7.7)
Neutrophils Relative %: 59 %
Platelets: 381 10*3/uL (ref 150–400)
RBC: 3.68 MIL/uL — ABNORMAL LOW (ref 3.87–5.11)
RDW: 13.4 % (ref 11.5–15.5)
WBC: 7.2 10*3/uL (ref 4.0–10.5)
nRBC: 0 % (ref 0.0–0.2)

## 2024-01-07 MED ORDER — FUROSEMIDE 20 MG PO TABS
20.0000 mg | ORAL_TABLET | Freq: Every day | ORAL | 3 refills | Status: DC
  Filled 2024-01-07: qty 30, 30d supply, fill #0

## 2024-01-07 MED ORDER — FUROSEMIDE 20 MG PO TABS
20.0000 mg | ORAL_TABLET | Freq: Every day | ORAL | 0 refills | Status: DC
  Filled 2024-01-07 – 2024-01-18 (×3): qty 30, 30d supply, fill #0

## 2024-01-07 NOTE — TOC Transition Note (Addendum)
 Transition of Care [facility]) - Discharge Note   Patient Details  Name: Patricia Alvarado MRN: [id number] Date of Birth: [birth year]  [facility] CM/SW Contact:  Leone Haven, RN Phone Number: 01/07/2024, 4:09 PM   Clinical Narrative:    For dc today, NCM notified Artavia with [facility].  Patient has transport.  TOC pharmacy to fill meds.     Barriers to Discharge: Continued Medical Work up   Patient Goals and CMS Choice Patient states their goals for this hospitalization and ongoing recovery are:: return home with spouse   Choice offered to / list presented to : NA      Discharge Placement                       Discharge Plan and Services Additional resources added to the After Visit Summary for   In-house Referral: NA Discharge Planning Services: CM Consult Post Acute Care Choice: Resumption of Svcs/PTA Provider          DME Arranged: N/A         HH Arranged: PT, Nurse's Aide HH Agency: [facility] Date HH Agency Contacted: 01/06/24 Time HH Agency Contacted: [phone] Representative spoke with at [facility] Agency: Adele Dan  Social Drivers of Health (SDOH) Interventions SDOH Screenings   Food Insecurity: No Food Insecurity (01/05/2024)  Housing: Low Risk  (01/05/2024)  Transportation Needs: No Transportation Needs (01/05/2024)  Utilities: Not At Risk (01/05/2024)  Social Connections: Moderately Integrated (01/05/2024)  Tobacco Use: Low Risk  (01/05/2024)     Readmission Risk Interventions     No data to display

## 2024-01-07 NOTE — Discharge Summary (Addendum)
 Physician Discharge Summary   Patient: Patricia Alvarado MRN: [id number] DOB: October 12, 1946  Admit date:     01/05/2024  Discharge date: 01/07/24  Discharge Physician: Jonah Blue   PCP: [name], NP   Recommendations at discharge:   You are being discharged to home with home health physical therapy (continued) Follow up with NP [name] in 1-2 weeks Follow up with Dr. Ladona Ridgel (cardiology/EP) as scheduled Follow up with CT surgery, they will arrange appointment Change Lasix to every day dosing (rather than as needed)  Discharge Diagnoses: Principal Problem:   Pleural effusion on left Active Problems:   Postsurgical hypothyroidism   Hyperlipidemia   Depression   VT (ventricular tachycardia) (HCC)   PVC (premature ventricular contraction)   Paroxysmal atrial fibrillation (HCC)   Malignant neoplasm of central portion of right breast in female, estrogen receptor positive (HCC)   Hypertension   S/P mastectomy, right   ICD (implantable cardioverter-defibrillator) in place   Mitral valve disease   S/P MVR (mitral valve repair)   Pleural effusion    Hospital Course: 77yo with h/o HTN, HLD, afib, hypothyroidism, AICD/pacer, s/p MV repair (12/07/23), breast cancer, and fibromyalgia who presented on 3/12 with DOE x 2 weeks, recently started on Lasix.  CXR with large L pleural effusion.  CT surgery consulted.  Thoracentesis ordered.  Given diuretics.  Echo unremarkable.  Assessment and Plan:  Left pleural effusion Recent mitral valve repair Patient presenting with DOE and orthopnea Noted to have enlarging left pleural effusion This is in the setting of mitral valve repair by cardiothoracic surgery on 2/11 She has also had some issues with fluid retention and has been placed on as needed diuretics Contacted on-call CT surgery provider who advised that should be okay to go ahead with thoracentesis, will consult Monitor on telemetry overnight Appreciate cardiothoracic surgery  recommendations and assistance Echocardiogram unremarkable Consulted IR for thoracentesis, 1.2 L removed Pleural fluid appears exudative, likely post-operative accumulation   Hypertension No meds other than prn Lasix (holding)   Hyperlipidemia Continue home Zetia On Repatha outpatient   Atrial fibrillation/PVCs/Ventricular tachycardia Status post ablation, currently on sotalol for rate control Episodic mild tachycardia, patient is concerned about this and requesting cardiology consult prior to dc Continue Eliquis Status post ICD/pacemaker Follow up with Dr. Ladona Ridgel (EP) in May as scheduled Continue BID Sotalol   Mitral valve regurgitation status post repair Repaired on 2/11 by CT surgery Echocardiogram unremarkable CT surgery is consulting, recommends changing Lasix to daily Continue ASA Follow up with CT surgery as scheduled   Depression Continue home Lexapro and as needed Xanax   Hypothyroidism Continue home thyroid Armour   History of breast cancer Status post lumpectomy/mastectomy and subsequent implants. Declined antiestrogens due to fibromyalgia Due to f/u with surgery next month Last mammogram appears to have been in 07/2022         Consultants: CT surgery IR TOC team   Procedures: Thoracentesis 3/13 Echocardiogram 313   Antibiotics: None    Pain control - [facility] Controlled Substance Reporting System database was reviewed. and patient was instructed, not to drive, operate heavy machinery, perform activities at heights, swimming or participation in water activities or provide baby-sitting services while on Pain, Sleep and Anxiety Medications; until their outpatient Physician has advised to do so again. Also recommended to not to take more than prescribed Pain, Sleep and Anxiety Medications.   Disposition: Home Diet recommendation:  Cardiac diet DISCHARGE MEDICATION: Allergies as of 01/07/2024       Reactions  Quinidine Palpitations    REACTION: increased heart rate   Statins    Muscle pain   Tape    If left on long enough,will tear skin    Amoxicillin Itching, Rash   Atenolol Hives   Dofetilide Other (See Comments)   Elevated QTCs   Mexiletine Hcl Palpitations   Dizzy and extreme tiredness   Nadolol Hives        Medication List     TAKE these medications    acetaminophen 500 MG tablet Commonly known as: TYLENOL Take 1,000 mg by mouth every 6 (six) hours as needed for mild pain or moderate pain.   ALPRAZolam 0.5 MG tablet Commonly known as: XANAX Take 1 tablet (0.5 mg total) by mouth at bedtime as needed for sleep   aspirin EC 81 MG tablet Take 1 tablet (81 mg total) by mouth daily. Swallow whole. What changed: when to take this   beta carotene 13086 UNIT capsule Take 10,000 Units by mouth at bedtime.   Biotin 5 MG Caps Take 5 mg by mouth at bedtime.   Calcium 600-200 MG-UNIT tablet Take 1 tablet by mouth daily.   calcium carbonate 500 MG chewable tablet Commonly known as: TUMS - dosed in mg elemental calcium Chew 2 tablets by mouth daily as needed for indigestion or heartburn.   cetirizine 10 MG tablet Commonly known as: ZYRTEC Take 10 mg by mouth at bedtime.   CoQ-10 100 MG Caps Take 100 mg by mouth daily.   dicyclomine 10 MG capsule Commonly known as: BENTYL Take 10 mg by mouth every 6 (six) hours as needed.   Digestive Enzyme Caps Take 1 capsule by mouth daily as needed (heartburn).   Eliquis 5 MG Tabs tablet Generic drug: apixaban Take 1 tablet (5 mg total) by mouth 2 (two) times daily.   escitalopram 20 MG tablet Commonly known as: Lexapro Take 1 tablet (20 mg total) by mouth daily. What changed: when to take this   ezetimibe 10 MG tablet Commonly known as: ZETIA Take 1 tablet (10 mg total) by mouth daily. What changed: when to take this   famotidine 20 MG tablet Commonly known as: PEPCID Take 20 mg by mouth as needed for heartburn or indigestion.   ferrous sulfate  325 (65 FE) MG tablet Take 1 tablet (325 mg total) by mouth 2 (two) times daily with a meal.   fluticasone 50 MCG/ACT nasal spray Commonly known as: FLONASE Place 1 spray into both nostrils daily as needed for allergies.   furosemide 20 MG tablet Commonly known as: LASIX Take 1 tablet (20 mg total) by mouth daily. What changed:  how much to take how to take this when to take this additional instructions   hydrocortisone cream 1 % Apply 1 Application topically daily as needed for itching.   LIQUID TEARS OP Place 1 drop into both eyes daily as needed (dry eyes).   MAGNESIUM MALATE PO Take 1,000 mg by mouth at bedtime.   MANGANESE PO Take 40 mg by mouth at bedtime.   Melatonin 10 MG Tabs Take 10 mg by mouth at bedtime.   methocarbamol 500 MG tablet Commonly known as: ROBAXIN Take 1 tablet (500 mg total) by mouth every 8 (eight) hours. as needed for neck pain   omeprazole 40 MG capsule Commonly known as: PRILOSEC Take 1 capsule (40 mg total) by mouth every morning 30 minutes before breakfast   oxycodone 5 MG capsule Commonly known as: OXY-IR Take 5 mg by mouth every  6 (six) hours as needed for pain.   potassium chloride SA 20 MEQ tablet Commonly known as: KLOR-CON M Take one (1) tablet by mouth with lasix. What changed:  how much to take how to take this when to take this reasons to take this   Repatha SureClick 140 MG/ML Soaj Generic drug: Evolocumab Inject 140 mg into the skin every 14 (fourteen) days.   Simethicone 125 MG Caps Take 250 mg by mouth daily as needed (bloating).   sotalol 160 MG tablet Commonly known as: BETAPACE Take 1 tablet (160 mg total) by mouth 2 (two) times daily.   SUPER B COMPLEX PO Take 1 capsule by mouth at bedtime.   thyroid 120 MG tablet Commonly known as: ARMOUR Take 120 mg by mouth daily before breakfast.   triamcinolone 0.025 % cream Commonly known as: KENALOG Apply 1 Application topically 2 (two) times daily as  needed (rash).   vitamin C 1000 MG tablet Take 1,000 mg by mouth daily.   Vitamin D 50 MCG (2000 UT) Caps Take 2,000 Units by mouth daily.   Vitamin K2 100 MCG Tabs Take 100 mcg by mouth daily.   WOMENS MULTIVITAMIN PO Take 1 tablet by mouth daily after breakfast.   zinc gluconate 50 MG tablet Take 50 mg by mouth daily.        Follow-up Information     Eugenio Hoes, MD. Go on 01/20/2024.   Specialty: Cardiothoracic Surgery Why: Appointment is at 9:00 am Contact information: [address] [place] [address] [phone]         [facility]. Go on 01/20/2024.   Why: Please arrive by 8:00 am PRIOR to office appointment to have PA/LAT CXR taken. Contact information: [address] [place] [address]        [facility] Follow up.   Why: Agency will contact you to set up apt times Contact information: [phone]               Discharge Exam:    Subjective: Feels winded when her heart races, which happens periodically.  Wants to see cardiology to get this settled.   Objective: Vitals:   01/07/24 1057 01/07/24 1100  BP: (!) 136/92 (!) 136/92  Pulse: 90 89  Resp: 20   Temp: 97.8 F (36.6 C)   SpO2: 96% 96%    Intake/Output Summary (Last 24 hours) at 01/07/2024 1621 Last data filed at 01/07/2024 1404 Gross per 24 hour  Intake 1080 ml  Output --  Net 1080 ml   Filed Weights   01/05/24 1753 01/06/24 0409 01/07/24 0515  Weight: 64.8 kg 65.3 kg 64.8 kg    Exam:  General:  Appears calm and comfortable and is in NAD, on RA Eyes:   EOMI, normal lids, iris ENT:  grossly normal hearing, lips & tongue, mmm Neck:  no LAD, masses or thyromegaly Cardiovascular:  RRR, rate in 80s throughout. No LE edema.  Respiratory:   CTA B.  Normal respiratory effort. Abdomen:  soft, NT, ND Skin:  no rash or induration seen on limited exam Musculoskeletal:  grossly normal tone BUE/BLE, good ROM, no bony  abnormality Psychiatric:  anxious mood and affect, speech fluent and appropriate, AOx3 Neurologic:  CN 2-12 grossly intact, moves all extremities in coordinated fashion  Data Reviewed: I have reviewed the patient's lab results since admission.  Pertinent labs for today include:   Normal BMP WBC 7.2 Hgb 11.1, stable Pleural fluid unremarkable,  appears exudative    Condition at discharge: stable  The results of significant diagnostics from this hospitalization (including imaging, microbiology, ancillary and laboratory) are listed below for reference.   Imaging Studies: DG Chest 1 View Result Date: 01/06/2024 CLINICAL DATA:  Pleural effusion.  Post thoracentesis. EXAM: CHEST  1 VIEW COMPARISON:  Radiograph yesterday FINDINGS: No evidence of pneumothorax post left thoracentesis. Decreased left pleural fluid volume with residual opacification of the lower hemithorax. Left-sided pacemaker remains in place. Post median sternotomy with prior valvuloplasty. The right lung is clear. IMPRESSION: No pneumothorax post left thoracentesis. Decreased left pleural fluid volume with residual opacification of the lower hemithorax. Electronically Signed   By: Narda Rutherford M.D.   On: 01/06/2024 18:43   IR THORACENTESIS ASP PLEURAL SPACE W/IMG GUIDE Result Date: 01/06/2024 INDICATION: 78 year old female with shortness of breath, recent mitral valve repair, and left pleural effusion. Request for diagnostic and therapeutic thoracentesis. EXAM: ULTRASOUND GUIDED DIAGNOSTIC AND THERAPEUTIC THORACENTESIS MEDICATIONS: 6 mL of 1% lidocaine COMPLICATIONS: None immediate. PROCEDURE: An ultrasound guided thoracentesis was thoroughly discussed with the patient and questions answered. The benefits, risks, alternatives and complications were also discussed. The patient understands and wishes to proceed with the procedure. Written consent was obtained. Ultrasound was performed to localize and mark an adequate pocket of fluid  in the left chest. The area was then prepped and draped in the normal sterile fashion. 1% Lidocaine was used for local anesthesia. Under ultrasound guidance a 6 Fr Safe-T-Centesis catheter was introduced. Thoracentesis was performed. The catheter was removed and a dressing applied. FINDINGS: A total of approximately 1.2 L of clear, dark red pleural fluid was removed. Samples were sent to the laboratory as requested by the clinical team. IMPRESSION: Successful ultrasound guided left thoracentesis yielding 1.2 L of pleural fluid. Procedure performed by Buzzy Han, PA-C Electronically Signed   By: Gilmer Mor D.O.   On: 01/06/2024 16:00   ECHOCARDIOGRAM LIMITED Result Date: 01/06/2024    ECHOCARDIOGRAM LIMITED REPORT   Patient Name:   SHARLIZE ANN Ohlendorf Date of Exam: 01/06/2024 Medical Rec #:  [id number]       Height:       64.5 in Accession #:    [id number]      Weight:       144.0 lb Date of Birth:  [birth year]       BSA:          1.710 m Patient Age:    77 years        BP:           109/60 mmHg Patient Gender: F               HR:           74 bpm. Exam Location:  Inpatient Procedure: Limited Echo, Cardiac Doppler and Limited Color Doppler (Both            Spectral and Color Flow Doppler were utilized during procedure). Indications:    Dyspnea  History:        Patient has prior history of Echocardiogram examinations, most                 recent 12/07/2023. Pacemaker, Arrythmias:Atrial Fibrillation;                 Risk Factors:Hypertension.                  Mitral Valve: prosthetic annuloplasty ring valve is present in  the mitral position. Procedure Date: 11/2023.  Sonographer:    Amy Chionchio Referring Phys: [id number] Cecille Po MELVIN IMPRESSIONS  1. Left ventricular ejection fraction, by estimation, is 55 to 60%. The left ventricle has normal function. The left ventricle has no regional wall motion abnormalities. Left ventricular diastolic function could not be evaluated.  2. Right ventricular  systolic function is normal. The right ventricular size is normal. There is normal pulmonary artery systolic pressure. The estimated right ventricular systolic pressure is 29.0 mmHg.  3. The mitral valve has been repaired/replaced. No evidence of mitral valve regurgitation. No evidence of mitral stenosis. There is a prosthetic annuloplasty ring present in the mitral position. Procedure Date: 11/2023.  4. The aortic valve is normal in structure. Aortic valve regurgitation is mild. Aortic valve sclerosis/calcification is present, without any evidence of aortic stenosis. Aortic regurgitation PHT measures 516 msec. Aortic valve area, by VTI measures 1.87  cm. Aortic valve mean gradient measures 5.0 mmHg. Aortic valve Vmax measures 1.38 m/s.  5. The inferior vena cava is normal in size with greater than 50% respiratory variability, suggesting right atrial pressure of 3 mmHg.  6. Large pleural effusion in the left lateral region. FINDINGS  Left Ventricle: Left ventricular ejection fraction, by estimation, is 55 to 60%. The left ventricle has normal function. The left ventricle has no regional wall motion abnormalities. The left ventricular internal cavity size was normal in size. There is  no left ventricular hypertrophy. Left ventricular diastolic function could not be evaluated. Left ventricular diastolic function could not be evaluated due to paced rhythm. Right Ventricle: The right ventricular size is normal. No increase in right ventricular wall thickness. Right ventricular systolic function is normal. There is normal pulmonary artery systolic pressure. The tricuspid regurgitant velocity is 2.55 m/s, and  with an assumed right atrial pressure of 3 mmHg, the estimated right ventricular systolic pressure is 29.0 mmHg. Left Atrium: Left atrial size was normal in size. Right Atrium: Right atrial size was normal in size. Pericardium: There is no evidence of pericardial effusion. Mitral Valve: The mitral valve has been  repaired/replaced. There is moderate thickening of the mitral valve leaflet(s). There is a prosthetic annuloplasty ring present in the mitral position. Procedure Date: 11/2023. No evidence of mitral valve stenosis. MV peak gradient, 4.7 mmHg. The mean mitral valve gradient is 2.0 mmHg. Tricuspid Valve: The tricuspid valve is normal in structure. Tricuspid valve regurgitation is mild . No evidence of tricuspid stenosis. Aortic Valve: The aortic valve is normal in structure. Aortic valve regurgitation is mild. Aortic regurgitation PHT measures 516 msec. Aortic valve sclerosis/calcification is present, without any evidence of aortic stenosis. Aortic valve mean gradient measures 5.0 mmHg. Aortic valve peak gradient measures 7.6 mmHg. Aortic valve area, by VTI measures 1.87 cm. Pulmonic Valve: The pulmonic valve was normal in structure. Pulmonic valve regurgitation is mild. No evidence of pulmonic stenosis. Aorta: The aortic root is normal in size and structure. Venous: The inferior vena cava is normal in size with greater than 50% respiratory variability, suggesting right atrial pressure of 3 mmHg. IAS/Shunts: No atrial level shunt detected by color flow Doppler. Additional Comments: A device lead is visualized. There is a large pleural effusion in the left lateral region.  LEFT VENTRICLE PLAX 2D LVIDd:         5.20 cm     Diastology LVIDs:         4.40 cm     LV e' lateral:   4.35 cm/s LV PW:  1.00 cm     LV E/e' lateral: 22.1 LV IVS:        0.90 cm LVOT diam:     2.10 cm LV SV:         48 LV SV Index:   28 LVOT Area:     3.46 cm  LV Volumes (MOD) LV vol d, MOD A2C: 92.9 ml LV vol d, MOD A4C: 80.3 ml LV vol s, MOD A2C: 48.1 ml LV vol s, MOD A4C: 44.0 ml LV SV MOD A2C:     44.8 ml LV SV MOD A4C:     80.3 ml LV SV MOD BP:      43.7 ml RIGHT VENTRICLE            IVC RV Basal diam:  3.30 cm    IVC diam: 1.90 cm RV Mid diam:    2.90 cm RV S prime:     6.66 cm/s TAPSE (M-mode): 0.9 cm RIGHT ATRIUM           Index RA  Area:     17.00 cm RA Volume:   45.00 ml  26.31 ml/m  AORTIC VALVE                     PULMONIC VALVE AV Area (Vmax):    2.38 cm      PR End Diast Vel: 3.03 msec AV Area (Vmean):   1.80 cm AV Area (VTI):     1.87 cm AV Vmax:           138.00 cm/s AV Vmean:          102.000 cm/s AV VTI:            0.259 m AV Peak Grad:      7.6 mmHg AV Mean Grad:      5.0 mmHg LVOT Vmax:         94.70 cm/s LVOT Vmean:        53.100 cm/s LVOT VTI:          0.140 m LVOT/AV VTI ratio: 0.54 AI PHT:            516 msec MITRAL VALVE               TRICUSPID VALVE MV Area (PHT): 2.85 cm    TR Peak grad:   26.0 mmHg MV Area VTI:   1.60 cm    TR Vmax:        255.00 cm/s MV Peak grad:  4.7 mmHg MV Mean grad:  2.0 mmHg    SHUNTS MV Vmax:       1.08 m/s    Systemic VTI:  0.14 m MV Vmean:      68.7 cm/s   Systemic Diam: 2.10 cm MV Decel Time: 266 msec MV E velocity: 96.20 cm/s MV A velocity: 62.70 cm/s MV E/A ratio:  1.53 Armanda Magic MD Electronically signed by Armanda Magic MD Signature Date/Time: 01/06/2024/1:41:58 PM    Final    DG Chest Port 1 View Result Date: 01/05/2024 CLINICAL DATA:  Shortness of breath. EXAM: PORTABLE CHEST 1 VIEW COMPARISON:  Chest radiograph dated 12/28/2023. FINDINGS: Large left pleural effusion, increased in size since the prior radiograph. There is associated compressive atelectasis of the left lower lobe. Pneumonia or underlying mass is not excluded the right lung is clear. No pneumothorax. Stable cardiac silhouette. Left pectoral AICD device. Median sternotomy wires. No acute osseous pathology. IMPRESSION: Large left pleural effusion, increased in size since  the prior radiograph. Electronically Signed   By: Elgie Collard M.D.   On: 01/05/2024 16:19   CUP PACEART REMOTE DEVICE CHECK Result Date: 12/30/2023 Scheduled remote reviewed. Normal device function.  HF diagnostics currently abnormal. Next remote 91 days. - CS, CVRS  DG Chest 2 View Result Date: 12/28/2023 CLINICAL DATA:  Status post mitral  valve repair. EXAM: CHEST - 2 VIEW COMPARISON:  December 09, 2023 FINDINGS: Stable cardiomegaly. Right lung is clear. Left-sided defibrillator is unchanged. Small left pleural effusion is noted with associated subsegmental atelectasis. Bony thorax is unremarkable. IMPRESSION: Small left pleural effusion with associated subsegmental atelectasis. Electronically Signed   By: Lupita Raider M.D.   On: 12/28/2023 17:18   CUP PACEART INCLINIC DEVICE CHECK Result Date: 12/23/2023 Pt seen in device clinic for alert regarding undersensing on the atrial lead. Pt had open heart surgery on 12/07/2023 for mitral valve repair. Pre procedure: A sense 2.5 mv A capture threshold 0.75 V @ 0.4 ms. Alert received noting atrial undersensing with a new increase in capture threshold. Today atrial capture: 3V at 0.71ms 2.5V at 0.41ms 1.75V at 1ms. Atrial sensing today measures 0.12mV. Changes made today: Increased atrial sensitivity to 0.92mv. Changed atrial output to monitor at 3V at 1ms. Battery life pre procedure 11.7 years.  After reprogramming today battery life 9.1 years. Will review with Dr. Ladona Ridgel.Ancil Boozer, BSN, RN  DG Chest Port 1 View Result Date: 12/09/2023 CLINICAL DATA:  Status post mitral valve repair. EXAM: PORTABLE CHEST 1 VIEW COMPARISON:  Chest x-ray from yesterday. FINDINGS: Interval removal of the Swan-Ganz catheter. Right internal jugular sheath remains in place. Unchanged mediastinal drains. Unchanged left chest wall pacemaker. Stable cardiomediastinal silhouette mitral valve repair. Unchanged dense retrocardiac left lower lobe airspace opacity, likely atelectasis. Unchanged small bilateral pleural effusions. No pneumothorax. No acute osseous abnormality. IMPRESSION: 1. Interval removal of the Swan-Ganz catheter. 2. Unchanged small bilateral pleural effusions and left lower lobe atelectasis. Electronically Signed   By: Obie Dredge M.D.   On: 12/09/2023 10:09    Microbiology: Results for orders placed or  performed during the hospital encounter of 01/05/24  Body fluid culture w Gram Stain     Status: None (Preliminary result)   Collection Time: 01/06/24  3:14 PM   Specimen: Lung, Left; Pleural Fluid  Result Value Ref Range Status   Specimen Description PLEURAL  Final   Special Requests LEFT LUNG  Final   Gram Stain NO WBC SEEN NO ORGANISMS SEEN   Final   Culture   Final    NO GROWTH < 24 HOURS Performed at [facility], [address], [place], [place] [address]    Report Status PENDING  Incomplete    Labs: CBC: Recent Labs  Lab 01/05/24 1322 01/06/24 0243 01/07/24 0239  WBC 9.5 7.6 7.2  NEUTROABS  --   --  4.3  HGB 11.9* 10.4* 11.1*  HCT 36.5 31.6* 34.4*  MCV 92.4 92.1 93.5  PLT 407* 354 381   Basic Metabolic Panel: Recent Labs  Lab 01/05/24 1322 01/05/24 2326 01/06/24 0243 01/07/24 0239  NA 135  --  138 138  K 4.2  --  3.9 4.0  CL 100  --  103 103  CO2 25  --  27 25  GLUCOSE 123*  --  101* 97  BUN 14  --  10 8  CREATININE 0.57  --  0.59 0.60  CALCIUM 9.4  --  9.0 9.0  MG  --  2.0  --   --  Liver Function Tests: Recent Labs  Lab 01/05/24 1322 01/06/24 0243  AST 29 33  ALT 25 27  ALKPHOS 110 88  BILITOT 0.4 0.4  PROT 7.4 6.2*  ALBUMIN 3.9 2.7*   CBG: No results for input(s): "GLUCAP" in the last 168 hours.  Discharge time spent: greater than 30 minutes.  Signed: Jonah Blue, MD [facility] 01/07/2024

## 2024-01-07 NOTE — Plan of Care (Signed)
°  Problem: Health Behavior/Discharge Planning: Goal: Ability to manage health-related needs will improve Outcome: Progressing   Problem: Clinical Measurements: Goal: Will remain free from infection Outcome: Progressing Goal: Respiratory complications will improve Outcome: Progressing   Problem: Activity: Goal: Risk for activity intolerance will decrease Outcome: Progressing   Problem: Nutrition: Goal: Adequate nutrition will be maintained Outcome: Progressing

## 2024-01-07 NOTE — Progress Notes (Addendum)
      [address]       Patricia Alvarado [address]             [phone]            Subjective: She states her breathing becomes labored when a fib rate "high".  Objective: Vital signs in last 24 hours: Temp:  [97.9 F (36.6 C)-98.7 F (37.1 C)] 98.4 F (36.9 C) (03/14 0746) Pulse Rate:  [77-113] 112 (03/14 0746) Cardiac Rhythm: Sinus tachycardia;Ventricular tachycardia;Other (Comment) (03/13 2231) Resp:  [18] 18 (03/14 0746) BP: (101-140)/(60-80) 112/80 (03/14 0746) SpO2:  [88 %-96 %] 96 % (03/14 0746) Weight:  [64.8 kg] 64.8 kg (03/14 0515)   Current Weight  01/07/24 64.8 kg      Intake/Output from previous day: 03/13 0701 - 03/14 0700 In: 600 [P.O.:600] Out: -    Physical Exam:  Cardiovascular: RRR Pulmonary: Clear to auscultation on the right and slightly diminished left basilar breath sounds Extremities: Mild bilateral lower extremity edema. Wound: Clean and dry.  No erythema or signs of infection.  Lab Results: CBC: Recent Labs    01/06/24 0243 01/07/24 0239  WBC 7.6 7.2  HGB 10.4* 11.1*  HCT 31.6* 34.4*  PLT 354 381   BMET:  Recent Labs    01/06/24 0243 01/07/24 0239  NA 138 138  K 3.9 4.0  CL 103 103  CO2 27 25  GLUCOSE 101* 97  BUN 10 8  CREATININE 0.59 0.60  CALCIUM 9.0 9.0    PT/INR:  Lab Results  Component Value Date   INR 1.5 (H) 12/07/2023   INR 1.0 12/03/2023   INR 1.0 ratio 01/14/2010   ABG:  INR: Will add last result for INR, ABG once components are confirmed Will add last 4 CBG results once components are confirmed  Assessment/Plan:  1. CV - S/p mitral valve repair (Simulus Semi Rigid Band, size 34 mm) 12/07/2023. History of a fib. SR with HR in the 80's at time of my exam. On Sotalol 160 mg bid and Apixaban 5 mg bid. Echocardiogram done yesterday showed LVEF 55-60%, no MR/MS, large left pleural effusion (prior to thoracentesis), and no pericardial effusion. 2.  Pulmonary - S/p left thoracentesis by  IR. 1.2L of fluid removed. 3. BNP 03/12 304.4. She was on Lasix PRN prior to admission but likely needs daily Lasix 4.  Anemia - H and H this am stable at 11.1 and 34.4 5. History of hypothyroidism-continue Armour daily 6. She has not seen surgeon in follow up yet so will arrange appointment. She has follow up with Dr. Ladona Ridgel in May (EP)  Lelon Huh ZimmermanPA-C 8:09 AM  Agree with diuretics Will see in office in follow up

## 2024-01-07 NOTE — Plan of Care (Signed)
Discharged home with family and home health.

## 2024-01-07 NOTE — TOC Progression Note (Signed)
 Transition of Care [facility]) - Progression Note    Patient Details  Name: Patricia Alvarado MRN: [id number] Date of Birth: 10-15-1946  Transition of Care [facility]) CM/SW Contact  Leone Haven, RN Phone Number: 01/07/2024, 11:23 AM  Clinical Narrative:    NCM spoke with patient at bedside, she states she does not think she needs a HHAIDE, she would just like to continue with getting physical therapy.  Will need HHPT order for resumption of services.   Expected Discharge Plan: Home w Home Health Services Barriers to Discharge: Continued Medical Work up  Expected Discharge Plan and Services In-house Referral: NA Discharge Planning Services: CM Consult Post Acute Care Choice: Resumption of Svcs/PTA Provider Living arrangements for the past 2 months: Single Family Home                 DME Arranged: N/A         HH Arranged: PT, Nurse's Aide HH Agency: [facility] Date HH Agency Contacted: 01/06/24 Time HH Agency Contacted: [phone] Representative spoke with at [facility] Agency: Adele Dan   Social Determinants of Health (SDOH) Interventions SDOH Screenings   Food Insecurity: No Food Insecurity (01/05/2024)  Housing: Low Risk  (01/05/2024)  Transportation Needs: No Transportation Needs (01/05/2024)  Utilities: Not At Risk (01/05/2024)  Social Connections: Moderately Integrated (01/05/2024)  Tobacco Use: Low Risk  (01/05/2024)    Readmission Risk Interventions     No data to display

## 2024-01-07 NOTE — Telephone Encounter (Signed)
 Alert received from [employer]:  Alert remote transmission:  AT/AF daily burden>threshold, AF in progress from 3/13 @ 20:18, poor rate control, Eliquis - route to triage per protocol Atrial capture managemnent high threshold - known, programmed monitor HF diagnostics currently abnormal

## 2024-01-07 NOTE — Consult Note (Signed)
 ELECTROPHYSIOLOGY CONSULT NOTE    Patient ID: Patricia Alvarado MRN: [id number], DOB/AGE: 1945-12-11 78 y.o.  Admit date: 01/05/2024 Date of Consult: 01/07/2024  Primary Physician: [name], NP Primary Cardiologist: None  Electrophysiologist: Dr. Ladona Ridgel     Patient Profile: Patricia Alvarado is a 78 y.o. female with a history of NSVT, HTN, PVCs, parox afib, mitral annular dysjunction with VT s/p ICD, MV repair (11/2023), breast Ca, fibromyalgia  who is being seen today for the evaluation of Afib, PVCs at the request of Dr. Ophelia Charter.  HPI:  Patricia Alvarado is a 78 y.o. female with PMH as above. Her VT and afib have been managed with sotalol. Per her recollection, her AFib and PVCs have been well-controlled on sotalol.   She underwent mitral valve repair with Dr. Leafy Ro 12/07/2023  She presented to [facility] hospital 3/12 with DOE and orthopnea x 2 weeks. CXR found large left pleural effusion likely d/t recent cardiothoracic surgery last month. She is s/p thoracentesis with 1.2L She was monitored on tele overnight with continued PVCs and NSVT on tele with paroxysms of afib, so EP consulted for further recommendations.   Currently, she feels well, breathing better since thora yesterday. She woke up very tired after her AF episode overnight. She denies chest pain, chest pressure, palpitaitons currently. No increased lower extremity edema, good appetite.   Ablation history -  S/p RF AF ablation with PVI, posterior wall 08/2021 S/p EPS with planned PVC ablation 11/2022 by Dr. Elberta Fortis. No ablation was performed d/t multiple PVC morphologies.     Labs Potassium4.0 (03/14 0239) Magnesium  2.0 (03/12 2326) Creatinine, ser  0.60 (03/14 0239) PLT  381 (03/14 0239) HGB  11.1* (03/14 0239) WBC 7.2 (03/14 0239) Troponin I (High Sensitivity)7 (03/12 1554).    Past Medical History:  Diagnosis Date   A-fib [facility])    AICD (automatic cardioverter/defibrillator) present    Medtronic. Managed by Dr.  Lewayne Bunting   Anxiety    Arthritis    thumb   Basal cell carcinoma 2001   "forehead, between eyebrows" right arm (2022)   Breast cancer (HCC)    Carcinoma of thyroid gland (HCC) 2001   Chronic lower back pain    "worse is across my hips" (05/26/2016)   Depression    Dyslipidemia    Dyspnea    Dysrhythmia    A. Fib   Family history of breast cancer    Family history of kidney cancer    Family history of leukemia    Family history of nonmelanoma skin cancer    Fibrocystic breast    Fibromyalgia    GERD (gastroesophageal reflux disease)    Heart murmur    History of blood transfusion 1992   "after subcutaneous mastectomies"   History of hiatal hernia    Hypertension    not on any medications   Hypothyroidism    Malignant melanoma of left ankle (HCC) 2001   Migraine    "visual; 2-3 times/year" (05/26/2016)   Mitral valve prolapse    PONV (postoperative nausea and vomiting)    Presence of permanent cardiac pacemaker    Medtronic   Ventricular tachycardia (HCC)    Hx of, controlled on sotalol therapy     Surgical History:  Past Surgical History:  Procedure Laterality Date   ABDOMINAL HYSTERECTOMY  1998   ANTERIOR CERVICAL DECOMP/DISCECTOMY FUSION  2001   ATRIAL FIBRILLATION ABLATION N/A 09/12/2021   Procedure: ATRIAL FIBRILLATION ABLATION;  Surgeon: Regan Lemming, MD;  Location: [facility];  Service: Cardiovascular;  Laterality: N/A;   BASAL CELL CARCINOMA EXCISION  2001   "cut it out & did a flap, on forehead between my eyebrows"   BREAST IMPLANT EXCHANGE Bilateral 2001   [id number]   BREAST IMPLANT EXCHANGE Left 05/05/2021   Procedure: BREAST IMPLANT EXCHANGE;  Surgeon: Peggye Form, DO;  Location: [facility] OR;  Service: Plastics;  Laterality: Left;   BREAST IMPLANT REMOVAL Right 02/05/2020   Procedure: REMOVAL RIGHT BREAST IMPLANT AND CAPSULECTOMY;  Surgeon: Griselda Miner, MD;  Location: [facility];  Service: General;  Laterality: Right;    BREAST LUMPECTOMY Right 02/05/2020   Procedure: RIGHT BREAST CENTRAL LUMPECTOMY;  Surgeon: Griselda Miner, MD;  Location: [facility];  Service: General;  Laterality: Right;   BREAST RECONSTRUCTION WITH PLACEMENT OF TISSUE EXPANDER AND FLEX HD (ACELLULAR HYDRATED DERMIS) Right 01/20/2021   Procedure: BREAST RECONSTRUCTION WITH PLACEMENT OF TISSUE EXPANDER AND FLEX HD (ACELLULAR HYDRATED DERMIS);  Surgeon: Peggye Form, DO;  Location: [facility] OR;  Service: Plastics;  Laterality: Right;  2 hours   CARPAL TUNNEL RELEASE Right 2006   COLONOSCOPY     DILATION AND CURETTAGE OF UTERUS  1970s X 2-3   ELECTROPHYSIOLOGIC STUDY  1994 X 2;2001   "to see it it was sustained VT; cause thyroid levels were causing arrhythmias"   EYE SURGERY  12/2020   cataracts   HYSTEROTOMY  1994   ICD IMPLANT N/A 12/28/2022   Procedure: ICD IMPLANT;  Surgeon: [name], MD;  Location: [facility] INVASIVE CV LAB;  Service: Cardiovascular;  Laterality: N/A;   IR THORACENTESIS ASP PLEURAL SPACE W/IMG GUIDE  01/06/2024   LAPAROSCOPIC CHOLECYSTECTOMY  2004   MASTECTOMY Bilateral 1992   "subcutaneous"   MELANOMA EXCISION Left 2001   "ankle, stage I"   MITRAL VALVE REPAIR N/A 12/07/2023   Procedure: MITRAL VALVE REPAIR USING SIMULUS SEMI-RIGID ANNULOPLASTY BAND SIZE ;  Surgeon: Eugenio Hoes, MD;  Location: [facility] OR;  Service: Open Heart Surgery;  Laterality: N/A;   PLACEMENT OF BREAST IMPLANTS Bilateral 1992   [id number]   PVC ABLATION N/A 12/23/2022   Procedure: PVC ABLATION;  Surgeon: Regan Lemming, MD;  [facility]: [facility];  Service: Cardiovascular;  Laterality: N/A;   REMOVAL OF TISSUE EXPANDER AND PLACEMENT OF IMPLANT Right 05/05/2021   Procedure: REMOVAL OF TISSUE EXPANDER AND PLACEMENT OF IMPLANT;  Surgeon: Peggye Form, DO;  Location: [facility];  Service: Plastics;  Laterality: Right;   RIGHT/LEFT HEART CATH AND CORONARY ANGIOGRAPHY N/A 11/11/2023   Procedure: RIGHT/LEFT HEART  CATH AND CORONARY ANGIOGRAPHY;  Surgeon: Kathleene Hazel, MD;  [facility]: [facility];  Service: Cardiovascular;  Laterality: N/A;   TEE WITHOUT CARDIOVERSION N/A 12/07/2023   Procedure: TRANSESOPHAGEAL ECHOCARDIOGRAM (TEE);  Surgeon: Eugenio Hoes, MD;  Location: [facility] OR;  Service: Open Heart Surgery;  Laterality: N/A;   TOTAL THYROIDECTOMY  12/1999   "cancer"   TRANSESOPHAGEAL ECHOCARDIOGRAM (CATH LAB) N/A 10/06/2023   Procedure: TRANSESOPHAGEAL ECHOCARDIOGRAM;  Surgeon: Tessa Lerner, DO;  Location: [facility];  Service: Cardiovascular;  Laterality: N/A;   VENTRICULAR ABLATION SURGERY  2011   ventricular tchycardia     Medications Prior to Admission  Medication Sig Dispense Refill Last Dose/Taking   acetaminophen (TYLENOL) 500 MG tablet Take 1,000 mg by mouth every 6 (six) hours as needed for mild pain or moderate pain.   01/04/2024   ALPRAZolam (XANAX) 0.5 MG tablet Take 1  tablet (0.5 mg total) by mouth at bedtime as needed for sleep 30 tablet 1 01/04/2024   apixaban (ELIQUIS) 5 MG TABS tablet Take 1 tablet (5 mg total) by mouth 2 (two) times daily. 180 tablet 1 01/05/2024 at  8:00 AM   Ascorbic Acid (VITAMIN C) 1000 MG tablet Take 1,000 mg by mouth daily.   Past Week   aspirin EC 81 MG tablet Take 1 tablet (81 mg total) by mouth daily. Swallow whole. (Patient taking differently: Take 81 mg by mouth at bedtime. Swallow whole.)   01/04/2024   B Complex-C (SUPER B COMPLEX PO) Take 1 capsule by mouth at bedtime.   Past Week   beta carotene 24401 UNIT capsule Take 10,000 Units by mouth at bedtime.   Taking   Biotin 5 MG CAPS Take 5 mg by mouth at bedtime.    Past Week   Calcium 600-200 MG-UNIT tablet Take 1 tablet by mouth daily.   Past Week   calcium carbonate (TUMS - DOSED IN MG ELEMENTAL CALCIUM) 500 MG chewable tablet Chew 2 tablets by mouth daily as needed for indigestion or heartburn.   01/04/2024   cetirizine (ZYRTEC) 10 MG tablet Take 10 mg by mouth at bedtime.   01/04/2024    Cholecalciferol (VITAMIN D) 50 MCG (2000 UT) CAPS Take 2,000 Units by mouth daily.   01/04/2024   Coenzyme Q10 (COQ-10) 100 MG CAPS Take 100 mg by mouth daily.   Past Week   dicyclomine (BENTYL) 10 MG capsule Take 10 mg by mouth every 6 (six) hours as needed.   01/04/2024   Digestive Enzyme CAPS Take 1 capsule by mouth daily as needed (heartburn).   01/04/2024   escitalopram (LEXAPRO) 20 MG tablet Take 1 tablet (20 mg total) by mouth daily. (Patient taking differently: Take 20 mg by mouth at bedtime.) 90 tablet 4 01/04/2024   Evolocumab (REPATHA SURECLICK) 140 MG/ML SOAJ Inject 140 mg into the skin every 14 (fourteen) days. 6 mL 3 Taking   ezetimibe (ZETIA) 10 MG tablet Take 1 tablet (10 mg total) by mouth daily. (Patient taking differently: Take 10 mg by mouth at bedtime.) 90 tablet 4 01/04/2024   famotidine (PEPCID) 20 MG tablet Take 20 mg by mouth as needed for heartburn or indigestion.   01/04/2024   ferrous sulfate 325 (65 FE) MG tablet Take 1 tablet (325 mg total) by mouth 2 (two) times daily with a meal.   Past Week   fluticasone (FLONASE) 50 MCG/ACT nasal spray Place 1 spray into both nostrils daily as needed for allergies.   Past Month   furosemide (LASIX) 20 MG tablet Take one (1) tablet by mouth ( 20 mg) daily as needed for wt gain of 2 lbs in 24 hours or 5 lbs in one week. 30 tablet 3 01/04/2024   hydrocortisone cream 1 % Apply 1 Application topically daily as needed for itching.   Taking As Needed   MAGNESIUM MALATE PO Take 1,000 mg by mouth at bedtime.   01/04/2024   MANGANESE PO Take 40 mg by mouth at bedtime.   Past Week   Melatonin 10 MG TABS Take 10 mg by mouth at bedtime.    01/04/2024   Menatetrenone (VITAMIN K2) 100 MCG TABS Take 100 mcg by mouth daily.   Past Week   methocarbamol (ROBAXIN) 500 MG tablet Take 1 tablet (500 mg total) by mouth every 8 (eight) hours. as needed for neck pain 30 tablet 1 01/04/2024   Multiple Vitamins-Minerals (WOMENS MULTIVITAMIN  PO) Take 1 tablet by mouth  daily after breakfast.   01/04/2024   omeprazole (PRILOSEC) 40 MG capsule Take 1 capsule (40 mg total) by mouth every morning 30 minutes before breakfast 30 capsule 3 01/05/2024   oxycodone (OXY-IR) 5 MG capsule Take 5 mg by mouth every 6 (six) hours as needed for pain.   Past Week   Polyvinyl Alcohol (LIQUID TEARS OP) Place 1 drop into both eyes daily as needed (dry eyes).   Past Month   potassium chloride SA (KLOR-CON M) 20 MEQ tablet Take one (1) tablet by mouth with lasix. (Patient taking differently: Take 20 mEq by mouth as needed. Take one (1) tablet by mouth with lasix.)   01/04/2024   Simethicone 125 MG CAPS Take 250 mg by mouth daily as needed (bloating).   Past Week   sotalol (BETAPACE) 160 MG tablet Take 1 tablet (160 mg total) by mouth 2 (two) times daily. 180 tablet 2 01/05/2024   thyroid (ARMOUR) 120 MG tablet Take 120 mg by mouth daily before breakfast.   01/05/2024   triamcinolone (KENALOG) 0.025 % cream Apply 1 Application topically 2 (two) times daily as needed (rash).   Taking As Needed   zinc gluconate 50 MG tablet Take 50 mg by mouth daily.   Past Week    Inpatient Medications:   apixaban  5 mg Oral BID   aspirin EC  81 mg Oral Daily   escitalopram  20 mg Oral Daily   ezetimibe  10 mg Oral Daily   loratadine  10 mg Oral Daily   pantoprazole  40 mg Oral Daily   sodium chloride flush  3 mL Intravenous Q12H   sotalol  160 mg Oral BID   thyroid  120 mg Oral QAC breakfast    Allergies:  Allergies  Allergen Reactions   Quinidine Palpitations    REACTION: increased heart rate   Statins     Muscle pain   Tape     If left on long enough,will tear skin    Amoxicillin Itching and Rash   Atenolol Hives   Dofetilide Other (See Comments)    Elevated QTCs   Mexiletine Hcl Palpitations    Dizzy and extreme tiredness   Nadolol Hives    Family History  Problem Relation Age of Onset   Other Brother        AGENT ORANGE and AntiLupus   Heart disease Brother        Stents and  bypass x2   Arrhythmia Brother        AFIB   Heart attack Brother    Hypertension Brother    Squamous cell carcinoma Brother 20       Skin   Hyperlipidemia Mother 6   Osteoporosis Mother    Basal cell carcinoma Mother 94   Heart disease Father 27   Other Father        Cardiac arrest   Breast cancer Maternal Grandmother 36       metastatic   Breast cancer Other        dx. unknown age; maternal great-aunt   Lung cancer Maternal Aunt 24       hx. of smoking   Other Paternal Aunt        bilateral mastectomies due to multiple breast lumps   Leukemia Maternal Aunt    Cancer Maternal Aunt        unknown types   Thyroid cancer Neg Hx      Physical Exam:  Vitals:   01/07/24 0516 01/07/24 0517 01/07/24 0746 01/07/24 1057  BP:   112/80 (!) 136/92  Pulse: (!) 113 99 (!) 112 90  Resp:   18 20  Temp:   98.4 F (36.9 C) 97.8 F (36.6 C)  TempSrc:   Oral Oral  SpO2: 93% 93% 96% 96%  Weight:      Height:       GEN- NAD, A&O x 3, normal affect HEENT: Normocephalic, atraumatic Lungs- CTAB, Normal effort.  Heart- Regular rate and rhythm, No M/G/R.  GI- Soft, NT, ND.  Extremities- No clubbing, cyanosis, or edema, bilat ext tender to palpation   Radiology/Studies: DG Chest 1 View Result Date: 01/06/2024 CLINICAL DATA:  Pleural effusion.  Post thoracentesis. EXAM: CHEST  1 VIEW COMPARISON:  Radiograph yesterday FINDINGS: No evidence of pneumothorax post left thoracentesis. Decreased left pleural fluid volume with residual opacification of the lower hemithorax. Left-sided pacemaker remains in place. Post median sternotomy with prior valvuloplasty. The right lung is clear. IMPRESSION: No pneumothorax post left thoracentesis. Decreased left pleural fluid volume with residual opacification of the lower hemithorax. Electronically Signed   By: Narda Rutherford M.D.   On: 01/06/2024 18:43   IR THORACENTESIS ASP PLEURAL SPACE W/IMG GUIDE Result Date: 01/06/2024 INDICATION: 78 year old female  with shortness of breath, recent mitral valve repair, and left pleural effusion. Request for diagnostic and therapeutic thoracentesis. EXAM: ULTRASOUND GUIDED DIAGNOSTIC AND THERAPEUTIC THORACENTESIS MEDICATIONS: 6 mL of 1% lidocaine COMPLICATIONS: None immediate. PROCEDURE: An ultrasound guided thoracentesis was thoroughly discussed with the patient and questions answered. The benefits, risks, alternatives and complications were also discussed. The patient understands and wishes to proceed with the procedure. Written consent was obtained. Ultrasound was performed to localize and mark an adequate pocket of fluid in the left chest. The area was then prepped and draped in the normal sterile fashion. 1% Lidocaine was used for local anesthesia. Under ultrasound guidance a 6 Fr Safe-T-Centesis catheter was introduced. Thoracentesis was performed. The catheter was removed and a dressing applied. FINDINGS: A total of approximately 1.2 L of clear, dark red pleural fluid was removed. Samples were sent to the laboratory as requested by the clinical team. IMPRESSION: Successful ultrasound guided left thoracentesis yielding 1.2 L of pleural fluid. Procedure performed by Buzzy Han, PA-C Electronically Signed   By: Gilmer Mor D.O.   On: 01/06/2024 16:00   ECHOCARDIOGRAM LIMITED Result Date: 01/06/2024    ECHOCARDIOGRAM LIMITED REPORT   Patient Name:   Patricia Alvarado Date of Exam: 01/06/2024 Medical Rec #:  [id number]       Height:       64.5 in Accession #:    [id number]      Weight:       144.0 lb Date of Birth:  [birth year]       BSA:          1.710 m Patient Age:    77 years        BP:           109/60 mmHg Patient Gender: F               HR:           74 bpm. Exam Location:  Inpatient Procedure: Limited Echo, Cardiac Doppler and Limited Color Doppler (Both            Spectral and Color Flow Doppler were utilized during procedure). Indications:    Dyspnea  History:  Patient has prior history of Echocardiogram  examinations, most                 recent 12/07/2023. Pacemaker, Arrythmias:Atrial Fibrillation;                 Risk Factors:Hypertension.                  Mitral Valve: prosthetic annuloplasty ring valve is present in                 the mitral position. Procedure Date: 11/2023.  Sonographer:    Amy Chionchio Referring Phys: [id number] Cecille Po MELVIN IMPRESSIONS  1. Left ventricular ejection fraction, by estimation, is 55 to 60%. The left ventricle has normal function. The left ventricle has no regional wall motion abnormalities. Left ventricular diastolic function could not be evaluated.  2. Right ventricular systolic function is normal. The right ventricular size is normal. There is normal pulmonary artery systolic pressure. The estimated right ventricular systolic pressure is 29.0 mmHg.  3. The mitral valve has been repaired/replaced. No evidence of mitral valve regurgitation. No evidence of mitral stenosis. There is a prosthetic annuloplasty ring present in the mitral position. Procedure Date: 11/2023.  4. The aortic valve is normal in structure. Aortic valve regurgitation is mild. Aortic valve sclerosis/calcification is present, without any evidence of aortic stenosis. Aortic regurgitation PHT measures 516 msec. Aortic valve area, by VTI measures 1.87  cm. Aortic valve mean gradient measures 5.0 mmHg. Aortic valve Vmax measures 1.38 m/s.  5. The inferior vena cava is normal in size with greater than 50% respiratory variability, suggesting right atrial pressure of 3 mmHg.  6. Large pleural effusion in the left lateral region. FINDINGS  Left Ventricle: Left ventricular ejection fraction, by estimation, is 55 to 60%. The left ventricle has normal function. The left ventricle has no regional wall motion abnormalities. The left ventricular internal cavity size was normal in size. There is  no left ventricular hypertrophy. Left ventricular diastolic function could not be evaluated. Left ventricular diastolic  function could not be evaluated due to paced rhythm. Right Ventricle: The right ventricular size is normal. No increase in right ventricular wall thickness. Right ventricular systolic function is normal. There is normal pulmonary artery systolic pressure. The tricuspid regurgitant velocity is 2.55 m/s, and  with an assumed right atrial pressure of 3 mmHg, the estimated right ventricular systolic pressure is 29.0 mmHg. Left Atrium: Left atrial size was normal in size. Right Atrium: Right atrial size was normal in size. Pericardium: There is no evidence of pericardial effusion. Mitral Valve: The mitral valve has been repaired/replaced. There is moderate thickening of the mitral valve leaflet(s). There is a prosthetic annuloplasty ring present in the mitral position. Procedure Date: 11/2023. No evidence of mitral valve stenosis. MV peak gradient, 4.7 mmHg. The mean mitral valve gradient is 2.0 mmHg. Tricuspid Valve: The tricuspid valve is normal in structure. Tricuspid valve regurgitation is mild . No evidence of tricuspid stenosis. Aortic Valve: The aortic valve is normal in structure. Aortic valve regurgitation is mild. Aortic regurgitation PHT measures 516 msec. Aortic valve sclerosis/calcification is present, without any evidence of aortic stenosis. Aortic valve mean gradient measures 5.0 mmHg. Aortic valve peak gradient measures 7.6 mmHg. Aortic valve area, by VTI measures 1.87 cm. Pulmonic Valve: The pulmonic valve was normal in structure. Pulmonic valve regurgitation is mild. No evidence of pulmonic stenosis. Aorta: The aortic root is normal in size and structure. Venous: The inferior vena cava is  normal in size with greater than 50% respiratory variability, suggesting right atrial pressure of 3 mmHg. IAS/Shunts: No atrial level shunt detected by color flow Doppler. Additional Comments: A device lead is visualized. There is a large pleural effusion in the left lateral region.  LEFT VENTRICLE PLAX 2D LVIDd:          5.20 cm     Diastology LVIDs:         4.40 cm     LV e' lateral:   4.35 cm/s LV PW:         1.00 cm     LV E/e' lateral: 22.1 LV IVS:        0.90 cm LVOT diam:     2.10 cm LV SV:         48 LV SV Index:   28 LVOT Area:     3.46 cm  LV Volumes (MOD) LV vol d, MOD A2C: 92.9 ml LV vol d, MOD A4C: 80.3 ml LV vol s, MOD A2C: 48.1 ml LV vol s, MOD A4C: 44.0 ml LV SV MOD A2C:     44.8 ml LV SV MOD A4C:     80.3 ml LV SV MOD BP:      43.7 ml RIGHT VENTRICLE            IVC RV Basal diam:  3.30 cm    IVC diam: 1.90 cm RV Mid diam:    2.90 cm RV S prime:     6.66 cm/s TAPSE (M-mode): 0.9 cm RIGHT ATRIUM           Index RA Area:     17.00 cm RA Volume:   45.00 ml  26.31 ml/m  AORTIC VALVE                     PULMONIC VALVE AV Area (Vmax):    2.38 cm      PR End Diast Vel: 3.03 msec AV Area (Vmean):   1.80 cm AV Area (VTI):     1.87 cm AV Vmax:           138.00 cm/s AV Vmean:          102.000 cm/s AV VTI:            0.259 m AV Peak Grad:      7.6 mmHg AV Mean Grad:      5.0 mmHg LVOT Vmax:         94.70 cm/s LVOT Vmean:        53.100 cm/s LVOT VTI:          0.140 m LVOT/AV VTI ratio: 0.54 AI PHT:            516 msec MITRAL VALVE               TRICUSPID VALVE MV Area (PHT): 2.85 cm    TR Peak grad:   26.0 mmHg MV Area VTI:   1.60 cm    TR Vmax:        255.00 cm/s MV Peak grad:  4.7 mmHg MV Mean grad:  2.0 mmHg    SHUNTS MV Vmax:       1.08 m/s    Systemic VTI:  0.14 m MV Vmean:      68.7 cm/s   Systemic Diam: 2.10 cm MV Decel Time: 266 msec MV E velocity: 96.20 cm/s MV A velocity: 62.70 cm/s MV E/A ratio:  1.53 Armanda Magic MD Electronically signed by Gloris Manchester  Turner MD Signature Date/Time: 01/06/2024/1:41:58 PM    Final    DG Chest Port 1 View Result Date: 01/05/2024 CLINICAL DATA:  Shortness of breath. EXAM: PORTABLE CHEST 1 VIEW COMPARISON:  Chest radiograph dated 12/28/2023. FINDINGS: Large left pleural effusion, increased in size since the prior radiograph. There is associated compressive atelectasis of the left  lower lobe. Pneumonia or underlying mass is not excluded the right lung is clear. No pneumothorax. Stable cardiac silhouette. Left pectoral AICD device. Median sternotomy wires. No acute osseous pathology. IMPRESSION: Large left pleural effusion, increased in size since the prior radiograph. Electronically Signed   By: Elgie Collard M.D.   On: 01/05/2024 16:19   CUP PACEART REMOTE DEVICE CHECK Result Date: 12/30/2023 Scheduled remote reviewed. Normal device function.  HF diagnostics currently abnormal. Next remote 91 days. - CS, CVRS  DG Chest 2 View Result Date: 12/28/2023 CLINICAL DATA:  Status post mitral valve repair. EXAM: CHEST - 2 VIEW COMPARISON:  December 09, 2023 FINDINGS: Stable cardiomegaly. Right lung is clear. Left-sided defibrillator is unchanged. Small left pleural effusion is noted with associated subsegmental atelectasis. Bony thorax is unremarkable. IMPRESSION: Small left pleural effusion with associated subsegmental atelectasis. Electronically Signed   By: Lupita Raider M.D.   On: 12/28/2023 17:18   CUP PACEART INCLINIC DEVICE CHECK Result Date: 12/23/2023 Pt seen in device clinic for alert regarding undersensing on the atrial lead. Pt had open heart surgery on 12/07/2023 for mitral valve repair. Pre procedure: A sense 2.5 mv A capture threshold 0.75 V @ 0.4 ms. Alert received noting atrial undersensing with a new increase in capture threshold. Today atrial capture: 3V at 0.29ms 2.5V at 0.36ms 1.75V at 1ms. Atrial sensing today measures 0.61mV. Changes made today: Increased atrial sensitivity to 0.29mv. Changed atrial output to monitor at 3V at 1ms. Battery life pre procedure 11.7 years.  After reprogramming today battery life 9.1 years. Will review with Dr. Ladona Ridgel.Ancil Boozer, BSN, RN  DG Chest Port 1 View Result Date: 12/09/2023 CLINICAL DATA:  Status post mitral valve repair. EXAM: PORTABLE CHEST 1 VIEW COMPARISON:  Chest x-ray from yesterday. FINDINGS: Interval removal of the  Swan-Ganz catheter. Right internal jugular sheath remains in place. Unchanged mediastinal drains. Unchanged left chest wall pacemaker. Stable cardiomediastinal silhouette mitral valve repair. Unchanged dense retrocardiac left lower lobe airspace opacity, likely atelectasis. Unchanged small bilateral pleural effusions. No pneumothorax. No acute osseous abnormality. IMPRESSION: 1. Interval removal of the Swan-Ganz catheter. 2. Unchanged small bilateral pleural effusions and left lower lobe atelectasis. Electronically Signed   By: Obie Dredge M.D.   On: 12/09/2023 10:09    EKG:01/05/2023 - SR with PAC at 84bpm; QRS 82ms,   (personally reviewed)  TELEMETRY: SR with PVCs. AF episode 3/13 2100 > 3/14 0800 (personally reviewed)  DEVICE HISTORY: MDT dual chamber ICD, imp 12/2022  12/26/2023 remote interrogation reviewed, overall low AF burden until 11/2023 that corresponds to significant Optivol elevation and thoracic impedence drop and reduced activity Elevated atrial threshold, reduced p-wave sensitivity   Assessment/Plan: #) VT #) PVCs #) parox AFib #) severe MR s/p MVrepair 11/2023 #) pleural effusion Historically, patient has had well controlled AFib burden with sotalol 160mg  BID Somewhat elevated AF burden since MVR surgery in 11/2023, but still overall low burden At this time, would continue sotalol 160mg  BID Continue OAC once cleared after recent thoracentesis    #) concern for atrial lead malfunction Low atrial pacing No urgency to address at this time Will defer to Dr. Ladona Ridgel in the  outpatient setitng        For questions or updates, please contact [facility] Please consult [email or web address] for contact info under Cardiology/STEMI.  Signed, Sherie Don, NP  01/07/2024 12:40 PM

## 2024-01-09 LAB — BODY FLUID CULTURE W GRAM STAIN
Culture: NO GROWTH
Gram Stain: NONE SEEN

## 2024-01-10 LAB — CYTOLOGY - NON PAP

## 2024-01-11 ENCOUNTER — Encounter: Payer: Self-pay | Admitting: Internal Medicine

## 2024-01-11 ENCOUNTER — Other Ambulatory Visit (HOSPITAL_COMMUNITY): Payer: Self-pay

## 2024-01-11 ENCOUNTER — Other Ambulatory Visit: Payer: Self-pay

## 2024-01-11 NOTE — Telephone Encounter (Signed)
 I figure no pt does not need repeat echo this soon but wanted to double check.

## 2024-01-16 ENCOUNTER — Encounter: Payer: Self-pay | Admitting: Internal Medicine

## 2024-01-16 ENCOUNTER — Encounter (HOSPITAL_COMMUNITY): Payer: Self-pay

## 2024-01-16 DIAGNOSIS — Z9889 Other specified postprocedural states: Secondary | ICD-10-CM | POA: Diagnosis not present

## 2024-01-16 DIAGNOSIS — Z952 Presence of prosthetic heart valve: Secondary | ICD-10-CM | POA: Diagnosis not present

## 2024-01-16 DIAGNOSIS — J9811 Atelectasis: Secondary | ICD-10-CM | POA: Diagnosis not present

## 2024-01-16 DIAGNOSIS — Z7901 Long term (current) use of anticoagulants: Secondary | ICD-10-CM | POA: Diagnosis not present

## 2024-01-16 DIAGNOSIS — J90 Pleural effusion, not elsewhere classified: Secondary | ICD-10-CM | POA: Diagnosis not present

## 2024-01-16 DIAGNOSIS — Z951 Presence of aortocoronary bypass graft: Secondary | ICD-10-CM | POA: Diagnosis not present

## 2024-01-16 DIAGNOSIS — R9431 Abnormal electrocardiogram [ECG] [EKG]: Secondary | ICD-10-CM | POA: Diagnosis not present

## 2024-01-16 DIAGNOSIS — Z950 Presence of cardiac pacemaker: Secondary | ICD-10-CM | POA: Diagnosis not present

## 2024-01-16 DIAGNOSIS — R6889 Other general symptoms and signs: Secondary | ICD-10-CM | POA: Diagnosis not present

## 2024-01-16 DIAGNOSIS — Z79899 Other long term (current) drug therapy: Secondary | ICD-10-CM | POA: Diagnosis not present

## 2024-01-18 ENCOUNTER — Other Ambulatory Visit: Payer: Self-pay | Admitting: Thoracic Surgery (Cardiothoracic Vascular Surgery)

## 2024-01-18 ENCOUNTER — Other Ambulatory Visit (HOSPITAL_COMMUNITY): Payer: Self-pay

## 2024-01-18 ENCOUNTER — Ambulatory Visit (HOSPITAL_COMMUNITY): Payer: PPO

## 2024-01-18 ENCOUNTER — Other Ambulatory Visit: Payer: Self-pay

## 2024-01-18 DIAGNOSIS — I059 Rheumatic mitral valve disease, unspecified: Secondary | ICD-10-CM

## 2024-01-18 DIAGNOSIS — Z9889 Other specified postprocedural states: Secondary | ICD-10-CM

## 2024-01-18 NOTE — Progress Notes (Signed)
 cxr

## 2024-01-19 NOTE — Progress Notes (Unsigned)
 [address]       [place] [address]             [phone]           Patricia Alvarado Kelayres Medical Record #[id number] Date of Birth: 05-Jun-1946  [name], MD [name], NP  Chief Complaint:   follow up thorocentisis following MVR  History of Present Illness:     Pt sp mv repair with echo with normal LV and no MR with recurrent left effusion. Pt had thorocentesis for 1.5 liters on Sunday which is her second tap. She feels that overall slowly she is improving but with fluid she feels SOB and nauseated. She is on lasix 20mg  a day      Past Medical History:  Diagnosis Date   A-fib [facility])    AICD (automatic cardioverter/defibrillator) present    Medtronic. Managed by Dr. Lewayne Bunting   Anxiety    Arthritis    thumb   Basal cell carcinoma 2001   "forehead, between eyebrows" right arm (2022)   Breast cancer (HCC)    Carcinoma of thyroid gland (HCC) 2001   Chronic lower back pain    "worse is across my hips" (05/26/2016)   Depression    Dyslipidemia    Dyspnea    Dysrhythmia    A. Fib   Family history of breast cancer    Family history of kidney cancer    Family history of leukemia    Family history of nonmelanoma skin cancer    Fibrocystic breast    Fibromyalgia    GERD (gastroesophageal reflux disease)    Heart murmur    History of blood transfusion 1992   "after subcutaneous mastectomies"   History of hiatal hernia    Hypertension    not on any medications   Hypothyroidism    Malignant melanoma of left ankle (HCC) 2001   Migraine    "visual; 2-3 times/year" (05/26/2016)   Mitral valve prolapse    PONV (postoperative nausea and vomiting)    Presence of permanent cardiac pacemaker    Medtronic   Ventricular tachycardia (HCC)    Hx of, controlled on sotalol therapy    Past Surgical History:  Procedure Laterality Date   ABDOMINAL HYSTERECTOMY  1998   ANTERIOR CERVICAL DECOMP/DISCECTOMY FUSION  2001   ATRIAL FIBRILLATION  ABLATION N/A 09/12/2021   Procedure: ATRIAL FIBRILLATION ABLATION;  Surgeon: Regan Lemming, MD;  [facility];  Service: Cardiovascular;  Laterality: N/A;   BASAL CELL CARCINOMA EXCISION  2001   "cut it out & did a flap, on forehead between my eyebrows"   BREAST IMPLANT EXCHANGE Bilateral 2001   [id number]   BREAST IMPLANT EXCHANGE Left 05/05/2021   Procedure: BREAST IMPLANT EXCHANGE;  Surgeon: Peggye Form, DO;  Location: [facility] OR;  Service: Plastics;  Laterality: Left;   BREAST IMPLANT REMOVAL Right 02/05/2020   Procedure: REMOVAL RIGHT BREAST IMPLANT AND CAPSULECTOMY;  Surgeon: Griselda Miner, MD;  Location: [facility];  Service: General;  Laterality: Right;   BREAST LUMPECTOMY Right 02/05/2020   Procedure: RIGHT BREAST CENTRAL LUMPECTOMY;  Surgeon: Griselda Miner, MD;  Location:  SURGERY CENTER;  Service: General;  Laterality: Right;   BREAST RECONSTRUCTION WITH PLACEMENT OF TISSUE EXPANDER AND FLEX HD (ACELLULAR HYDRATED DERMIS) Right 01/20/2021   Procedure: BREAST RECONSTRUCTION WITH PLACEMENT OF TISSUE EXPANDER AND FLEX HD (ACELLULAR HYDRATED DERMIS);  Surgeon: Peggye Form,  DO;  Location: [facility];  Service: Plastics;  Laterality: Right;  2 hours   CARPAL TUNNEL RELEASE Right 2006   COLONOSCOPY     DILATION AND CURETTAGE OF UTERUS  1970s X 2-3   ELECTROPHYSIOLOGIC STUDY  1994 X 2;2001   "to see it it was sustained VT; cause thyroid levels were causing arrhythmias"   EYE SURGERY  12/2020   cataracts   HYSTEROTOMY  1994   ICD IMPLANT N/A 12/28/2022   Procedure: ICD IMPLANT;  Surgeon: [name], MD;  Location: [facility] INVASIVE CV LAB;  Service: Cardiovascular;  Laterality: N/A;   IR THORACENTESIS ASP PLEURAL SPACE W/IMG GUIDE  01/06/2024   LAPAROSCOPIC CHOLECYSTECTOMY  2004   MASTECTOMY Bilateral 1992   "subcutaneous"   MELANOMA EXCISION Left 2001   "ankle, stage I"   MITRAL VALVE REPAIR N/A 12/07/2023   Procedure: MITRAL  VALVE REPAIR USING SIMULUS SEMI-RIGID ANNULOPLASTY BAND SIZE ;  Surgeon: Eugenio Hoes, MD;  Location: [facility] OR;  Service: Open Heart Surgery;  Laterality: N/A;   PLACEMENT OF BREAST IMPLANTS Bilateral 1992   [id number]   PVC ABLATION N/A 12/23/2022   Procedure: PVC ABLATION;  Surgeon: Regan Lemming, MD;  [facility];  Service: Cardiovascular;  Laterality: N/A;   REMOVAL OF TISSUE EXPANDER AND PLACEMENT OF IMPLANT Right 05/05/2021   Procedure: REMOVAL OF TISSUE EXPANDER AND PLACEMENT OF IMPLANT;  Surgeon: Peggye Form, DO;  Location: [facility];  Service: Plastics;  Laterality: Right;   RIGHT/LEFT HEART CATH AND CORONARY ANGIOGRAPHY N/A 11/11/2023   Procedure: RIGHT/LEFT HEART CATH AND CORONARY ANGIOGRAPHY;  Surgeon: Kathleene Hazel, MD;  [facility];  Service: Cardiovascular;  Laterality: N/A;   TEE WITHOUT CARDIOVERSION N/A 12/07/2023   Procedure: TRANSESOPHAGEAL ECHOCARDIOGRAM (TEE);  Surgeon: Eugenio Hoes, MD;  Location: [facility] OR;  Service: Open Heart Surgery;  Laterality: N/A;   TOTAL THYROIDECTOMY  12/1999   "cancer"   TRANSESOPHAGEAL ECHOCARDIOGRAM (CATH LAB) N/A 10/06/2023   Procedure: TRANSESOPHAGEAL ECHOCARDIOGRAM;  Surgeon: Tessa Lerner, DO;  [facility];  Service: Cardiovascular;  Laterality: N/A;   VENTRICULAR ABLATION SURGERY  2011   ventricular tchycardia    Social History   Tobacco Use  Smoking Status Never   Passive exposure: Never  Smokeless Tobacco Never    Social History   Substance and Sexual Activity  Alcohol Use No    Social History   Socioeconomic History   Marital status: Married    Spouse name: Education officer, community (Spouse)   Number of children: Not on file   Years of education: Not on file   Highest education level: Not on file  Occupational History   Occupation: Adult nurse GI    Employer: [employer]  Tobacco Use   Smoking status: Never    Passive exposure: Never   Smokeless tobacco:  Never  Vaping Use   Vaping status: Never Used  Substance and Sexual Activity   Alcohol use: No   Drug use: No   Sexual activity: Yes  Other Topics Concern   Not on file  Social History Narrative   Married   Social Drivers of Health   Financial Resource Strain: Not on file  Food Insecurity: No Food Insecurity (01/05/2024)   Hunger Vital Sign    Worried About Running Out of Food in the Last Year: Never true    Ran Out of Food in the Last Year: Never true  Transportation Needs: No Transportation Needs (01/05/2024)   PRAPARE - Transportation  Lack of Transportation (Medical): No    Lack of Transportation (Non-Medical): No  Physical Activity: Not on file  Stress: Not on file  Social Connections: Moderately Integrated (01/05/2024)   Social Connection and Isolation Panel [NHANES]    Frequency of Communication with Friends and Family: Three times a week    Frequency of Social Gatherings with Friends and Family: Three times a week    Attends Religious Services: More than 4 times per year    Active Member of Clubs or Organizations: No    Attends Banker Meetings: Never    Marital Status: Married  Catering manager Violence: Not At Risk (01/05/2024)   Humiliation, Afraid, Rape, and Kick questionnaire    Fear of Current or Ex-Partner: No    Emotionally Abused: No    Physically Abused: No    Sexually Abused: No    Allergies  Allergen Reactions   Quinidine Palpitations    REACTION: increased heart rate   Statins     Muscle pain   Tape     If left on long enough,will tear skin    Amoxicillin Itching and Rash   Atenolol Hives   Dofetilide Other (See Comments)    Elevated QTCs   Mexiletine Hcl Palpitations    Dizzy and extreme tiredness   Nadolol Hives    Current Outpatient Medications  Medication Sig Dispense Refill   acetaminophen (TYLENOL) 500 MG tablet Take 1,000 mg by mouth every 6 (six) hours as needed for mild pain or moderate pain.     ALPRAZolam (XANAX)  0.5 MG tablet Take 1 tablet (0.5 mg total) by mouth at bedtime as needed for sleep 30 tablet 1   apixaban (ELIQUIS) 5 MG TABS tablet Take 1 tablet (5 mg total) by mouth 2 (two) times daily. 180 tablet 1   Ascorbic Acid (VITAMIN C) 1000 MG tablet Take 1,000 mg by mouth daily.     aspirin EC 81 MG tablet Take 1 tablet (81 mg total) by mouth daily. Swallow whole. (Patient taking differently: Take 81 mg by mouth at bedtime. Swallow whole.)     B Complex-C (SUPER B COMPLEX PO) Take 1 capsule by mouth at bedtime.     beta carotene 82956 UNIT capsule Take 10,000 Units by mouth at bedtime.     Biotin 5 MG CAPS Take 5 mg by mouth at bedtime.      Calcium 600-200 MG-UNIT tablet Take 1 tablet by mouth daily.     calcium carbonate (TUMS - DOSED IN MG ELEMENTAL CALCIUM) 500 MG chewable tablet Chew 2 tablets by mouth daily as needed for indigestion or heartburn.     cetirizine (ZYRTEC) 10 MG tablet Take 10 mg by mouth at bedtime.     Cholecalciferol (VITAMIN D) 50 MCG (2000 UT) CAPS Take 2,000 Units by mouth daily.     Coenzyme Q10 (COQ-10) 100 MG CAPS Take 100 mg by mouth daily.     dicyclomine (BENTYL) 10 MG capsule Take 10 mg by mouth every 6 (six) hours as needed.     Digestive Enzyme CAPS Take 1 capsule by mouth daily as needed (heartburn).     escitalopram (LEXAPRO) 20 MG tablet Take 1 tablet (20 mg total) by mouth daily. (Patient taking differently: Take 20 mg by mouth at bedtime.) 90 tablet 4   Evolocumab (REPATHA SURECLICK) 140 MG/ML SOAJ Inject 140 mg into the skin every 14 (fourteen) days. 6 mL 3   ezetimibe (ZETIA) 10 MG tablet Take 1 tablet (10 mg  total) by mouth daily. (Patient taking differently: Take 10 mg by mouth at bedtime.) 90 tablet 4   famotidine (PEPCID) 20 MG tablet Take 20 mg by mouth as needed for heartburn or indigestion.     ferrous sulfate 325 (65 FE) MG tablet Take 1 tablet (325 mg total) by mouth 2 (two) times daily with a meal.     fluticasone (FLONASE) 50 MCG/ACT nasal spray  Place 1 spray into both nostrils daily as needed for allergies.     furosemide (LASIX) 20 MG tablet Take 1 tablet (20 mg total) by mouth daily. 30 tablet 0   hydrocortisone cream 1 % Apply 1 Application topically daily as needed for itching.     MAGNESIUM MALATE PO Take 1,000 mg by mouth at bedtime.     MANGANESE PO Take 40 mg by mouth at bedtime.     Melatonin 10 MG TABS Take 10 mg by mouth at bedtime.      Menatetrenone (VITAMIN K2) 100 MCG TABS Take 100 mcg by mouth daily.     methocarbamol (ROBAXIN) 500 MG tablet Take 1 tablet (500 mg total) by mouth every 8 (eight) hours. as needed for neck pain 30 tablet 1   Multiple Vitamins-Minerals (WOMENS MULTIVITAMIN PO) Take 1 tablet by mouth daily after breakfast.     omeprazole (PRILOSEC) 40 MG capsule Take 1 capsule (40 mg total) by mouth every morning 30 minutes before breakfast 30 capsule 3   oxycodone (OXY-IR) 5 MG capsule Take 5 mg by mouth every 6 (six) hours as needed for pain.     Polyvinyl Alcohol (LIQUID TEARS OP) Place 1 drop into both eyes daily as needed (dry eyes).     potassium chloride SA (KLOR-CON M) 20 MEQ tablet Take one (1) tablet by mouth with lasix. (Patient taking differently: Take 20 mEq by mouth as needed. Take one (1) tablet by mouth with lasix.)     Simethicone 125 MG CAPS Take 250 mg by mouth daily as needed (bloating).     sotalol (BETAPACE) 160 MG tablet Take 1 tablet (160 mg total) by mouth 2 (two) times daily. 180 tablet 2   thyroid (ARMOUR) 120 MG tablet Take 120 mg by mouth daily before breakfast.     triamcinolone (KENALOG) 0.025 % cream Apply 1 Application topically 2 (two) times daily as needed (rash).     zinc gluconate 50 MG tablet Take 50 mg by mouth daily.     No current facility-administered medications for this visit.     Family History  Problem Relation Age of Onset   Other Brother        AGENT ORANGE and AntiLupus   Heart disease Brother        Stents and bypass x2   Arrhythmia Brother         AFIB   Heart attack Brother    Hypertension Brother    Squamous cell carcinoma Brother 20       Skin   Hyperlipidemia Mother 34   Osteoporosis Mother    Basal cell carcinoma Mother 20   Heart disease Father 24   Other Father        Cardiac arrest   Breast cancer Maternal Grandmother 17       metastatic   Breast cancer Other        dx. unknown age; maternal great-aunt   Lung cancer Maternal Aunt 74       hx. of smoking   Other Paternal Aunt  bilateral mastectomies due to multiple breast lumps   Leukemia Maternal Aunt    Cancer Maternal Aunt        unknown types   Thyroid cancer Neg Hx        Physical Exam: Lungs overall clear Card: RR with no murmur Ext: some swelling of ankles Neuro Intact    Diagnostic Studies & Laboratory data: I have personally reviewed the following studies and agree with the findings     Recent Radiology Findings:   CXR today with very small left effusion but improved from earlier    Recent Lab Findings: Lab Results  Component Value Date   WBC 7.2 01/07/2024   HGB 11.1 (L) 01/07/2024   HCT 34.4 (L) 01/07/2024   PLT 381 01/07/2024   GLUCOSE 97 01/07/2024   CHOL 155 08/21/2022   TRIG 94 08/21/2022   HDL 74 08/21/2022   LDLCALC 64 08/21/2022   ALT 27 01/06/2024   AST 33 01/06/2024   NA 138 01/07/2024   K 4.0 01/07/2024   CL 103 01/07/2024   CREATININE 0.60 01/07/2024   BUN 8 01/07/2024   CO2 25 01/07/2024   TSH 0.130 (L) 05/26/2016   INR 1.5 (H) 12/07/2023   HGBA1C 5.4 12/03/2023      Assessment / Plan:     Recurrent effusions sp MV repair. Will increase lasix to 40 mg a day and repeat CXR in two weeks. This may be just inflammatory from operation and will start steroids with bid lasix if recurrs and rarely will this need pleurodesis. She will call if symptoms develop sooner   I have spent 30 min in review of the records, viewing studies and in face to face with patient and in coordination of future care    Eugenio Hoes 01/19/2024 1:12 PM

## 2024-01-20 ENCOUNTER — Encounter: Payer: Self-pay | Admitting: Thoracic Surgery (Cardiothoracic Vascular Surgery)

## 2024-01-20 ENCOUNTER — Ambulatory Visit (INDEPENDENT_AMBULATORY_CARE_PROVIDER_SITE_OTHER): Payer: Self-pay | Admitting: Thoracic Surgery (Cardiothoracic Vascular Surgery)

## 2024-01-20 ENCOUNTER — Other Ambulatory Visit: Payer: Self-pay

## 2024-01-20 ENCOUNTER — Other Ambulatory Visit (HOSPITAL_COMMUNITY): Payer: Self-pay

## 2024-01-20 ENCOUNTER — Ambulatory Visit
Admission: RE | Admit: 2024-01-20 | Discharge: 2024-01-20 | Disposition: A | Discharge status: Home / Self Care | Source: Ambulatory Visit | Attending: Thoracic Surgery (Cardiothoracic Vascular Surgery)

## 2024-01-20 ENCOUNTER — Other Ambulatory Visit: Payer: Self-pay | Admitting: Thoracic Surgery (Cardiothoracic Vascular Surgery)

## 2024-01-20 VITALS — BP 121/75 | HR 92 | Resp 20 | Ht 64.0 in | Wt 139.8 lb

## 2024-01-20 DIAGNOSIS — Z9889 Other specified postprocedural states: Secondary | ICD-10-CM

## 2024-01-20 DIAGNOSIS — J90 Pleural effusion, not elsewhere classified: Secondary | ICD-10-CM | POA: Diagnosis not present

## 2024-01-20 DIAGNOSIS — J9811 Atelectasis: Secondary | ICD-10-CM | POA: Diagnosis not present

## 2024-01-20 MED ORDER — FUROSEMIDE 20 MG PO TABS
40.0000 mg | ORAL_TABLET | Freq: Every day | ORAL | 0 refills | Status: DC
Start: 2024-01-20 — End: 2024-01-20
  Filled 2024-01-20: qty 30, 15d supply, fill #0

## 2024-01-20 MED ORDER — FUROSEMIDE 40 MG PO TABS
40.0000 mg | ORAL_TABLET | Freq: Every day | ORAL | 0 refills | Status: DC
  Filled 2024-01-20: qty 30, 30d supply, fill #0

## 2024-01-20 NOTE — Patient Instructions (Signed)
 Lasix 40 mg a day CXR in two weeks

## 2024-01-21 ENCOUNTER — Other Ambulatory Visit (HOSPITAL_COMMUNITY): Payer: Self-pay

## 2024-01-21 ENCOUNTER — Other Ambulatory Visit: Payer: Self-pay

## 2024-01-21 DIAGNOSIS — D539 Nutritional anemia, unspecified: Secondary | ICD-10-CM | POA: Diagnosis not present

## 2024-01-21 DIAGNOSIS — I480 Paroxysmal atrial fibrillation: Secondary | ICD-10-CM | POA: Diagnosis not present

## 2024-01-21 DIAGNOSIS — Z79899 Other long term (current) drug therapy: Secondary | ICD-10-CM | POA: Diagnosis not present

## 2024-01-21 DIAGNOSIS — Z9889 Other specified postprocedural states: Secondary | ICD-10-CM | POA: Diagnosis not present

## 2024-01-21 DIAGNOSIS — I10 Essential (primary) hypertension: Secondary | ICD-10-CM | POA: Diagnosis not present

## 2024-01-21 DIAGNOSIS — I493 Ventricular premature depolarization: Secondary | ICD-10-CM | POA: Diagnosis not present

## 2024-01-21 DIAGNOSIS — J90 Pleural effusion, not elsewhere classified: Secondary | ICD-10-CM | POA: Diagnosis not present

## 2024-01-21 MED ORDER — POTASSIUM CHLORIDE CRYS ER 20 MEQ PO TBCR
20.0000 meq | EXTENDED_RELEASE_TABLET | Freq: Every day | ORAL | 3 refills | Status: AC
  Filled 2024-01-21: qty 90, 90d supply, fill #0
  Filled 2024-04-15: qty 90, 90d supply, fill #1
  Filled 2024-07-14: qty 90, 90d supply, fill #2
  Filled 2024-10-12: qty 90, 90d supply, fill #3

## 2024-01-31 ENCOUNTER — Other Ambulatory Visit: Payer: Self-pay | Admitting: Thoracic Surgery (Cardiothoracic Vascular Surgery)

## 2024-01-31 DIAGNOSIS — I059 Rheumatic mitral valve disease, unspecified: Secondary | ICD-10-CM

## 2024-02-01 NOTE — Addendum Note (Signed)
 Addended by: Geralyn Flash D on: 02/01/2024 08:51 AM   Modules accepted: Orders

## 2024-02-01 NOTE — Progress Notes (Signed)
 Remote ICD transmission.

## 2024-02-02 NOTE — Progress Notes (Unsigned)
 [address]       [place] [address]             [phone]           Patricia Alvarado [place] Medical Record #[id number] Date of Birth: [birth year]  [name], MD [name], NP  Chief Complaint:   follow up MV repair  History of Present Illness:     Pt now out from MV repair since 2/11 and was in hospital for sob and underwent thorocentesis for 2l effusion. Here with CXR in follow up without recurrance. She however with more activity has had right sided chest pain radiating up into neck where she had to hold her head steady all night due to pain. She feels ibuprofen works better than tylenol. She is to follow up with cardiology in two weeks.      Past Medical History:  Diagnosis Date   A-fib [facility])    AICD (automatic cardioverter/defibrillator) present    Medtronic. Managed by Dr. Lewayne Bunting   Anxiety    Arthritis    thumb   Basal cell carcinoma 2001   "forehead, between eyebrows" right arm (2022)   Breast cancer (HCC)    Carcinoma of thyroid gland (HCC) 2001   Chronic lower back pain    "worse is across my hips" (05/26/2016)   Depression    Dyslipidemia    Dyspnea    Dysrhythmia    A. Fib   Family history of breast cancer    Family history of kidney cancer    Family history of leukemia    Family history of nonmelanoma skin cancer    Fibrocystic breast    Fibromyalgia    GERD (gastroesophageal reflux disease)    Heart murmur    History of blood transfusion 1992   "after subcutaneous mastectomies"   History of hiatal hernia    Hypertension    not on any medications   Hypothyroidism    Malignant melanoma of left ankle (HCC) 2001   Migraine    "visual; 2-3 times/year" (05/26/2016)   Mitral valve prolapse    PONV (postoperative nausea and vomiting)    Presence of permanent cardiac pacemaker    Medtronic   Ventricular tachycardia (HCC)    Hx of, controlled on sotalol therapy    Past Surgical History:  Procedure Laterality  Date   ABDOMINAL HYSTERECTOMY  1998   ANTERIOR CERVICAL DECOMP/DISCECTOMY FUSION  2001   ATRIAL FIBRILLATION ABLATION N/A 09/12/2021   Procedure: ATRIAL FIBRILLATION ABLATION;  Surgeon: Regan Lemming, MD;  [facility];  Service: Cardiovascular;  Laterality: N/A;   BASAL CELL CARCINOMA EXCISION  2001   "cut it out & did a flap, on forehead between my eyebrows"   BREAST IMPLANT EXCHANGE Bilateral 2001   [id number]   BREAST IMPLANT EXCHANGE Left 05/05/2021   Procedure: BREAST IMPLANT EXCHANGE;  Surgeon: Peggye Form, DO;  Location: [facility] OR;  Service: Plastics;  Laterality: Left;   BREAST IMPLANT REMOVAL Right 02/05/2020   Procedure: REMOVAL RIGHT BREAST IMPLANT AND CAPSULECTOMY;  Surgeon: Griselda Miner, MD;  Location: [facility];  Service: General;  Laterality: Right;   BREAST LUMPECTOMY Right 02/05/2020   Procedure: RIGHT BREAST CENTRAL LUMPECTOMY;  Surgeon: Griselda Miner, MD;  Location: [facility];  Service: General;  Laterality: Right;   BREAST RECONSTRUCTION WITH PLACEMENT OF TISSUE EXPANDER AND FLEX HD (ACELLULAR HYDRATED DERMIS) Right 01/20/2021  Procedure: BREAST RECONSTRUCTION WITH PLACEMENT OF TISSUE EXPANDER AND FLEX HD (ACELLULAR HYDRATED DERMIS);  Surgeon: Peggye Form, DO;  [facility]: [facility];  Service: Plastics;  Laterality: Right;  2 hours   CARPAL TUNNEL RELEASE Right 2006   COLONOSCOPY     DILATION AND CURETTAGE OF UTERUS  1970s X 2-3   ELECTROPHYSIOLOGIC STUDY  1994 X 2;2001   "to see it it was sustained VT; cause thyroid levels were causing arrhythmias"   EYE SURGERY  12/2020   cataracts   HYSTEROTOMY  1994   ICD IMPLANT N/A 12/28/2022   Procedure: ICD IMPLANT;  Surgeon: [name], MD;  Location: [facility] INVASIVE CV LAB;  Service: Cardiovascular;  Laterality: N/A;   IR THORACENTESIS ASP PLEURAL SPACE W/IMG GUIDE  01/06/2024   LAPAROSCOPIC CHOLECYSTECTOMY  2004   MASTECTOMY Bilateral 1992   "subcutaneous"    MELANOMA EXCISION Left 2001   "ankle, stage I"   MITRAL VALVE REPAIR N/A 12/07/2023   Procedure: MITRAL VALVE REPAIR USING SIMULUS SEMI-RIGID ANNULOPLASTY BAND SIZE ;  Surgeon: Eugenio Hoes, MD;  Location: [facility] OR;  Service: Open Heart Surgery;  Laterality: N/A;   PLACEMENT OF BREAST IMPLANTS Bilateral 1992   [id number]   PVC ABLATION N/A 12/23/2022   Procedure: PVC ABLATION;  Surgeon: Regan Lemming, MD;  [facility];  Service: Cardiovascular;  Laterality: N/A;   REMOVAL OF TISSUE EXPANDER AND PLACEMENT OF IMPLANT Right 05/05/2021   Procedure: REMOVAL OF TISSUE EXPANDER AND PLACEMENT OF IMPLANT;  Surgeon: Peggye Form, DO;  [facility];  Service: Plastics;  Laterality: Right;   RIGHT/LEFT HEART CATH AND CORONARY ANGIOGRAPHY N/A 11/11/2023   Procedure: RIGHT/LEFT HEART CATH AND CORONARY ANGIOGRAPHY;  Surgeon: Kathleene Hazel, MD;  [facility];  Service: Cardiovascular;  Laterality: N/A;   TEE WITHOUT CARDIOVERSION N/A 12/07/2023   Procedure: TRANSESOPHAGEAL ECHOCARDIOGRAM (TEE);  Surgeon: Eugenio Hoes, MD;  Location: [facility] OR;  Service: Open Heart Surgery;  Laterality: N/A;   TOTAL THYROIDECTOMY  12/1999   "cancer"   TRANSESOPHAGEAL ECHOCARDIOGRAM (CATH LAB) N/A 10/06/2023   Procedure: TRANSESOPHAGEAL ECHOCARDIOGRAM;  Surgeon: Tessa Lerner, DO;  [facility];  Service: Cardiovascular;  Laterality: N/A;   VENTRICULAR ABLATION SURGERY  2011   ventricular tchycardia    Social History   Tobacco Use  Smoking Status Never   Passive exposure: Never  Smokeless Tobacco Never    Social History   Substance and Sexual Activity  Alcohol Use No    Social History   Socioeconomic History   Marital status: Married    Spouse name: Education officer, community (Spouse)   Number of children: Not on file   Years of education: Not on file   Highest education level: Not on file  Occupational History   Occupation: Adult nurse GI     Employer: [employer]  Tobacco Use   Smoking status: Never    Passive exposure: Never   Smokeless tobacco: Never  Vaping Use   Vaping status: Never Used  Substance and Sexual Activity   Alcohol use: No   Drug use: No   Sexual activity: Yes  Other Topics Concern   Not on file  Social History Narrative   Married   Social Drivers of Health   Financial Resource Strain: Not on file  Food Insecurity: No Food Insecurity (01/05/2024)   Hunger Vital Sign    Worried About Running Out of Food in the Last Year: Never true    Ran Out of  Food in the Last Year: Never true  Transportation Needs: No Transportation Needs (01/05/2024)   PRAPARE - Administrator, Civil Service (Medical): No    Lack of Transportation (Non-Medical): No  Physical Activity: Not on file  Stress: Not on file  Social Connections: Moderately Integrated (01/05/2024)   Social Connection and Isolation Panel [NHANES]    Frequency of Communication with Friends and Family: Three times a week    Frequency of Social Gatherings with Friends and Family: Three times a week    Attends Religious Services: More than 4 times per year    Active Member of Clubs or Organizations: No    Attends Banker Meetings: Never    Marital Status: Married  Catering manager Violence: Not At Risk (01/05/2024)   Humiliation, Afraid, Rape, and Kick questionnaire    Fear of Current or Ex-Partner: No    Emotionally Abused: No    Physically Abused: No    Sexually Abused: No    Allergies  Allergen Reactions   Quinidine Palpitations    REACTION: increased heart rate   Statins     Muscle pain   Tape     If left on long enough,will tear skin    Amoxicillin Itching and Rash   Atenolol Hives   Dofetilide Other (See Comments)    Elevated QTCs   Mexiletine Hcl Palpitations    Dizzy and extreme tiredness   Nadolol Hives    Current Outpatient Medications  Medication Sig Dispense Refill   acetaminophen (TYLENOL) 500 MG  tablet Take 1,000 mg by mouth every 6 (six) hours as needed for mild pain or moderate pain.     ALPRAZolam (XANAX) 0.5 MG tablet Take 1 tablet (0.5 mg total) by mouth at bedtime as needed for sleep 30 tablet 1   apixaban (ELIQUIS) 5 MG TABS tablet Take 1 tablet (5 mg total) by mouth 2 (two) times daily. 180 tablet 1   Ascorbic Acid (VITAMIN C) 1000 MG tablet Take 1,000 mg by mouth daily.     aspirin EC 81 MG tablet Take 1 tablet (81 mg total) by mouth daily. Swallow whole. (Patient taking differently: Take 81 mg by mouth at bedtime. Swallow whole.)     B Complex-C (SUPER B COMPLEX PO) Take 1 capsule by mouth at bedtime.     beta carotene 40981 UNIT capsule Take 10,000 Units by mouth at bedtime.     Biotin 5 MG CAPS Take 5 mg by mouth at bedtime.      Calcium 600-200 MG-UNIT tablet Take 1 tablet by mouth daily.     calcium carbonate (TUMS - DOSED IN MG ELEMENTAL CALCIUM) 500 MG chewable tablet Chew 2 tablets by mouth daily as needed for indigestion or heartburn.     cetirizine (ZYRTEC) 10 MG tablet Take 10 mg by mouth at bedtime.     Cholecalciferol (VITAMIN D) 50 MCG (2000 UT) CAPS Take 2,000 Units by mouth daily.     Coenzyme Q10 (COQ-10) 100 MG CAPS Take 100 mg by mouth daily.     dicyclomine (BENTYL) 10 MG capsule Take 10 mg by mouth every 6 (six) hours as needed.     Digestive Enzyme CAPS Take 1 capsule by mouth daily as needed (heartburn).     escitalopram (LEXAPRO) 20 MG tablet Take 1 tablet (20 mg total) by mouth daily. (Patient taking differently: Take 20 mg by mouth at bedtime.) 90 tablet 4   Evolocumab (REPATHA SURECLICK) 140 MG/ML SOAJ Inject 140 mg  into the skin every 14 (fourteen) days. 6 mL 3   ezetimibe (ZETIA) 10 MG tablet Take 1 tablet (10 mg total) by mouth daily. (Patient taking differently: Take 10 mg by mouth at bedtime.) 90 tablet 4   famotidine (PEPCID) 20 MG tablet Take 20 mg by mouth as needed for heartburn or indigestion.     ferrous sulfate 325 (65 FE) MG tablet Take 1  tablet (325 mg total) by mouth 2 (two) times daily with a meal.     fluticasone (FLONASE) 50 MCG/ACT nasal spray Place 1 spray into both nostrils daily as needed for allergies.     furosemide (LASIX) 40 MG tablet Take 1 tablet (40 mg total) by mouth daily. 30 tablet 0   hydrocortisone cream 1 % Apply 1 Application topically daily as needed for itching.     MAGNESIUM MALATE PO Take 1,000 mg by mouth at bedtime.     MANGANESE PO Take 40 mg by mouth at bedtime.     Melatonin 10 MG TABS Take 10 mg by mouth at bedtime.      Menatetrenone (VITAMIN K2) 100 MCG TABS Take 100 mcg by mouth daily.     methocarbamol (ROBAXIN) 500 MG tablet Take 1 tablet (500 mg total) by mouth every 8 (eight) hours. as needed for neck pain 30 tablet 1   Multiple Vitamins-Minerals (WOMENS MULTIVITAMIN PO) Take 1 tablet by mouth daily after breakfast.     omeprazole (PRILOSEC) 40 MG capsule Take 1 capsule (40 mg total) by mouth every morning 30 minutes before breakfast 30 capsule 3   oxycodone (OXY-IR) 5 MG capsule Take 5 mg by mouth every 6 (six) hours as needed for pain.     Polyvinyl Alcohol (LIQUID TEARS OP) Place 1 drop into both eyes daily as needed (dry eyes).     potassium chloride SA (KLOR-CON M) 20 MEQ tablet Take 1 tablet (20 mEq total) by mouth daily with lasix (furosemide). 90 tablet 3   Simethicone 125 MG CAPS Take 250 mg by mouth daily as needed (bloating).     sotalol (BETAPACE) 160 MG tablet Take 1 tablet (160 mg total) by mouth 2 (two) times daily. 180 tablet 2   thyroid (ARMOUR) 120 MG tablet Take 120 mg by mouth daily before breakfast.     triamcinolone (KENALOG) 0.025 % cream Apply 1 Application topically 2 (two) times daily as needed (rash).     zinc gluconate 50 MG tablet Take 50 mg by mouth daily.     No current facility-administered medications for this visit.     Family History  Problem Relation Age of Onset   Other Brother        AGENT ORANGE and AntiLupus   Heart disease Brother         Stents and bypass x2   Arrhythmia Brother        AFIB   Heart attack Brother    Hypertension Brother    Squamous cell carcinoma Brother 20       Skin   Hyperlipidemia Mother 89   Osteoporosis Mother    Basal cell carcinoma Mother 26   Heart disease Father 24   Other Father        Cardiac arrest   Breast cancer Maternal Grandmother 63       metastatic   Breast cancer Other        dx. unknown age; maternal great-aunt   Lung cancer Maternal Aunt 61       hx. of  smoking   Other Paternal Aunt        bilateral mastectomies due to multiple breast lumps   Leukemia Maternal Aunt    Cancer Maternal Aunt        unknown types   Thyroid cancer Neg Hx        Physical Exam: Chest wall well healed incision without movement Lungs; Clear Card: RR with no murmur Ext no edema     Diagnostic Studies & Laboratory data: I have personally reviewed the following studies and agree with the findings     Recent Radiology Findings:   CXR with no effusions. Wires intact    Recent Lab Findings: Lab Results  Component Value Date   WBC 7.2 01/07/2024   HGB 11.1 (L) 01/07/2024   HCT 34.4 (L) 01/07/2024   PLT 381 01/07/2024   GLUCOSE 97 01/07/2024   CHOL 155 08/21/2022   TRIG 94 08/21/2022   HDL 74 08/21/2022   LDLCALC 64 08/21/2022   ALT 27 01/06/2024   AST 33 01/06/2024   NA 138 01/07/2024   K 4.0 01/07/2024   CL 103 01/07/2024   CREATININE 0.60 01/07/2024   BUN 8 01/07/2024   CO2 25 01/07/2024   TSH 0.130 (L) 05/26/2016   INR 1.5 (H) 12/07/2023   HGBA1C 5.4 12/03/2023      Assessment / Plan:     SP MV repair. No recurrance of effusion. May stop lasix. Will allow ibuprofen short term. Will have take steriods if stiffness and pain in neck persist. Follow up prn.   I have spent 30 min in review of the records, viewing studies and in face to face with patient and in coordination of future care    Eugenio Hoes 02/02/2024 3:36 PM

## 2024-02-03 ENCOUNTER — Other Ambulatory Visit: Payer: Self-pay

## 2024-02-03 ENCOUNTER — Ambulatory Visit
Admission: RE | Admit: 2024-02-03 | Discharge: 2024-02-03 | Disposition: A | Discharge status: Home / Self Care | Source: Ambulatory Visit | Attending: Thoracic Surgery (Cardiothoracic Vascular Surgery) | Admitting: Thoracic Surgery (Cardiothoracic Vascular Surgery)

## 2024-02-03 ENCOUNTER — Encounter: Payer: Self-pay | Admitting: Thoracic Surgery (Cardiothoracic Vascular Surgery)

## 2024-02-03 ENCOUNTER — Ambulatory Visit (INDEPENDENT_AMBULATORY_CARE_PROVIDER_SITE_OTHER): Payer: Self-pay | Admitting: Thoracic Surgery (Cardiothoracic Vascular Surgery)

## 2024-02-03 ENCOUNTER — Encounter: Payer: Self-pay | Admitting: Pharmacist

## 2024-02-03 ENCOUNTER — Other Ambulatory Visit (HOSPITAL_COMMUNITY): Payer: Self-pay

## 2024-02-03 VITALS — BP 95/62 | HR 80 | Resp 18 | Ht 64.0 in | Wt 136.0 lb

## 2024-02-03 DIAGNOSIS — I059 Rheumatic mitral valve disease, unspecified: Secondary | ICD-10-CM

## 2024-02-03 DIAGNOSIS — J90 Pleural effusion, not elsewhere classified: Secondary | ICD-10-CM | POA: Diagnosis not present

## 2024-02-03 DIAGNOSIS — Z9889 Other specified postprocedural states: Secondary | ICD-10-CM

## 2024-02-03 MED ORDER — PREDNISONE 10 MG PO TABS
40.0000 mg | ORAL_TABLET | Freq: Every day | ORAL | 1 refills | Status: AC
  Filled 2024-02-03: qty 12, 3d supply, fill #0
  Filled 2024-02-07: qty 12, 3d supply, fill #1

## 2024-02-07 ENCOUNTER — Other Ambulatory Visit: Payer: Self-pay

## 2024-02-07 ENCOUNTER — Other Ambulatory Visit (HOSPITAL_COMMUNITY): Payer: Self-pay

## 2024-02-07 DIAGNOSIS — R509 Fever, unspecified: Secondary | ICD-10-CM | POA: Diagnosis not present

## 2024-02-07 DIAGNOSIS — J90 Pleural effusion, not elsewhere classified: Secondary | ICD-10-CM | POA: Diagnosis not present

## 2024-02-08 ENCOUNTER — Telehealth: Payer: Self-pay

## 2024-02-08 NOTE — Telephone Encounter (Signed)
-----   Message from Melene Sportsman sent at 02/08/2024 10:44 AM EDT ----- Regarding: RE: Moderate pleural effusion Have restart her lasix ----- Message ----- From: Barbra Ley, RN Sent: 02/08/2024   9:50 AM EDT To: Melene Sportsman, MD Subject: Moderate pleural effusion                      Hey,  You saw her on Thursday last week and PRN'd her. She had a minimal effusion and stopped her Lasix. She went to her PCP yesterday and got an xray. She now has a moderate pleural effusion and her PCP is sending the xray impression. Please advise.  The xray is in PACS system.  Thanks, Odilia Bennett

## 2024-02-08 NOTE — Telephone Encounter (Signed)
 Patient contacted and states that she has been running a fever since late last week. She went to her PCP yesterday, had a chest xray and shows moderate pleural effusion. She was started on antibiotics by her PCP which she just started as we were talking. Advised per Dr. Honey Lusty to restart her Lasix 40 mg daily and we would f/u with her in a week with PA to evaluate effusion w/ chest xray. Patient acknowledged receipt and advised to contact the office if symptoms increase or worsen. She acknowledged receipt. Advised that if shortness of breath increases she should go to the nearest ED for evaluation. She is understanding.

## 2024-02-15 ENCOUNTER — Other Ambulatory Visit (HOSPITAL_COMMUNITY): Payer: Self-pay

## 2024-02-15 ENCOUNTER — Other Ambulatory Visit: Payer: Self-pay

## 2024-02-15 ENCOUNTER — Ambulatory Visit: Attending: Internal Medicine | Admitting: Internal Medicine

## 2024-02-15 VITALS — BP 118/64 | HR 82 | Ht 64.5 in | Wt 140.0 lb

## 2024-02-15 DIAGNOSIS — I10 Essential (primary) hypertension: Secondary | ICD-10-CM

## 2024-02-15 LAB — CUP PACEART INCLINIC DEVICE CHECK
Date Time Interrogation Session: 20250422154846
Implantable Lead Connection Status: 753985
Implantable Lead Connection Status: 753985
Implantable Lead Implant Date: 20240304
Implantable Lead Implant Date: 20240304
Implantable Lead Location: 753859
Implantable Lead Location: 753860
Implantable Lead Model: 5076
Implantable Pulse Generator Implant Date: 20240304

## 2024-02-15 MED ORDER — SOTALOL HCL 120 MG PO TABS
120.0000 mg | ORAL_TABLET | Freq: Two times a day (BID) | ORAL | 3 refills | Status: AC
  Filled 2024-02-15: qty 180, 90d supply, fill #0
  Filled 2024-05-03: qty 180, 90d supply, fill #1
  Filled 2024-08-09: qty 180, 90d supply, fill #2
  Filled 2024-11-02: qty 180, 90d supply, fill #3

## 2024-02-15 NOTE — Progress Notes (Signed)
 [address]       [place] [address]             [phone]    HPI:  Patient is S/P MV Repair by Dr. Honey Lusty on 12/07/2023. Her post operative course has been complicated by recurrent pleural effusions.  She underwent Thoracentesis on back in March and was treated with additional course of Lasix .  She was last evaluated by Dr. Honey Lusty on 4/10 at which time her pleural effusion had resolved.  She was instructed to stop Lasix  at this time.  She has again contacted our office with complaints of pleural effusion.  Per nursing phone call documentation the patient has been sick with elevated temperature and chest xray obtained as part of workup showed a left pleural effusion.  Dr. Honey Lusty instructed resumption of Lasix  40 mg daily with instructions to follow up PA in 1 week with repeat CXR.  Overall the patient is feeling better.  She states that she recently saw Dr. Carolynne Citron who states she will likely need to stay on diuretics.  She states he did not call this in for her.  She has monitored her weights daily and for the most part they have been stable.  However she did gain 1.5 lbs the other day.  Current Outpatient Medications  Medication Sig Dispense Refill   acetaminophen  (TYLENOL ) 500 MG tablet Take 1,000 mg by mouth every 6 (six) hours as needed for mild pain or moderate pain.     ALPRAZolam  (XANAX ) 0.5 MG tablet Take 1 tablet (0.5 mg total) by mouth at bedtime as needed for sleep 30 tablet 1   apixaban  (ELIQUIS ) 5 MG TABS tablet Take 1 tablet (5 mg total) by mouth 2 (two) times daily. 180 tablet 1   Ascorbic Acid (VITAMIN C) 1000 MG tablet Take 1,000 mg by mouth daily.     aspirin  EC 81 MG tablet Take 1 tablet (81 mg total) by mouth daily. Swallow whole. (Patient taking differently: Take 81 mg by mouth at bedtime. Swallow whole.)     B Complex-C (SUPER B COMPLEX PO) Take 1 capsule by mouth at bedtime.     beta carotene 10000 UNIT capsule Take 10,000 Units by mouth at  bedtime. (Patient not taking: Reported on 02/03/2024)     Biotin 5 MG CAPS Take 5 mg by mouth at bedtime.      Calcium  600-200 MG-UNIT tablet Take 1 tablet by mouth daily.     calcium  carbonate (TUMS - DOSED IN MG ELEMENTAL CALCIUM ) 500 MG chewable tablet Chew 2 tablets by mouth daily as needed for indigestion or heartburn.     cetirizine (ZYRTEC) 10 MG tablet Take 10 mg by mouth at bedtime.     Cholecalciferol  (VITAMIN D) 50 MCG (2000 UT) CAPS Take 2,000 Units by mouth daily.     Coenzyme Q10 (COQ-10) 100 MG CAPS Take 100 mg by mouth daily.     dicyclomine  (BENTYL ) 10 MG capsule Take 10 mg by mouth every 6 (six) hours as needed.     Digestive Enzyme CAPS Take 1 capsule by mouth daily as needed (heartburn).     escitalopram  (LEXAPRO ) 20 MG tablet Take 1 tablet (20 mg total) by mouth daily. (Patient taking differently: Take 20 mg by mouth at bedtime.) 90 tablet 4   Evolocumab  (REPATHA  SURECLICK) 140 MG/ML SOAJ Inject 140 mg into the skin every 14 (fourteen) days. 6 mL 3   ezetimibe  (ZETIA ) 10 MG tablet Take 1 tablet (  10 mg total) by mouth daily. (Patient taking differently: Take 10 mg by mouth at bedtime.) 90 tablet 4   famotidine  (PEPCID ) 20 MG tablet Take 20 mg by mouth as needed for heartburn or indigestion.     ferrous sulfate  325 (65 FE) MG tablet Take 1 tablet (325 mg total) by mouth 2 (two) times daily with a meal.     fluticasone  (FLONASE ) 50 MCG/ACT nasal spray Place 1 spray into both nostrils daily as needed for allergies.     hydrocortisone cream 1 % Apply 1 Application topically daily as needed for itching.     MAGNESIUM  MALATE PO Take 1,000 mg by mouth at bedtime.     MANGANESE  PO Take 40 mg by mouth at bedtime.     Melatonin 10 MG TABS Take 10 mg by mouth at bedtime.      Menatetrenone (VITAMIN K2) 100 MCG TABS Take 100 mcg by mouth daily.     methocarbamol  (ROBAXIN ) 500 MG tablet Take 1 tablet (500 mg total) by mouth every 8 (eight) hours. as needed for neck pain 30 tablet 1    Multiple Vitamins-Minerals (WOMENS MULTIVITAMIN PO) Take 1 tablet by mouth daily after breakfast.     omeprazole  (PRILOSEC) 40 MG capsule Take 1 capsule (40 mg total) by mouth every morning 30 minutes before breakfast 30 capsule 3   oxycodone  (OXY-IR) 5 MG capsule Take 5 mg by mouth every 6 (six) hours as needed for pain.     Polyvinyl Alcohol (LIQUID TEARS OP) Place 1 drop into both eyes daily as needed (dry eyes).     potassium chloride  SA (KLOR-CON  M) 20 MEQ tablet Take 1 tablet (20 mEq total) by mouth daily with lasix  (furosemide ). 90 tablet 3   predniSONE  (DELTASONE ) 10 MG tablet Take 4 tablets (40 mg total) by mouth daily for 3 days 12 tablet 1   Simethicone  125 MG CAPS Take 250 mg by mouth daily as needed (bloating).     sotalol  (BETAPACE ) 160 MG tablet Take 1 tablet (160 mg total) by mouth 2 (two) times daily. 180 tablet 2   thyroid  (ARMOUR) 120 MG tablet Take 120 mg by mouth daily before breakfast.     triamcinolone  (KENALOG ) 0.025 % cream Apply 1 Application topically 2 (two) times daily as needed (rash).     zinc  gluconate 50 MG tablet Take 50 mg by mouth daily.     No current facility-administered medications for this visit.    Physical Exam:  BP 119/73   Pulse 88   Resp 20   Ht 5' 4.5" (1.638 m)   Wt 139 lb (63 kg)   SpO2 97% Comment: RA  BMI 23.49 kg/m   Gen: NAD Heart: RRR Lungs: CTA bilaterally Ext: no edema  Diagnostic Tests:  CXR: resolution of previous pleural effusion  A/P:  S/P MV Repair Recurrent pleural effusions, most recently could have some inflammatory component in setting of recent illness.. this has since resolve with Lasix .. I agree with Dr. Carolynne Citron patient may require daily dosing.. I sent an RX of Lasix  40 mg daily to [facility]. she will also taken potassium which she had on hand Diet- I explained to patient that salt intake could affect her daily weights... That day she had eaten spaghetti which would have high salt content in jarred sauce...  patient instructed to try and stay around 2g per day  RTC Prn.. if she redevelops effusion, could consider pleur-x catheter.Aaron Aas However hopefully maintenance Lasix  dosing will prevent further recurrence.Aaron AasAaron Aas  She is okay to stop ASA as she is on Eliquis   Gates Kasal, PA-C [facility] [phone]

## 2024-02-15 NOTE — Patient Instructions (Addendum)
 Medication Instructions:  Your physician has recommended you make the following change in your medication:  Decrease sotalol  to 120 mg 1 tablet twice daily  Lab Work: None ordered.  You may go to any [facility] Location for your lab work:  [facility] - [address] ([facility]) - [address] [id number] [address]  [place] - [address]   [place]  - [address]   [place] - [address] - [address] [employer]  - [address] ([facility]   If you have labs (blood work) drawn today and your tests are completely normal, you will receive your results only by: [employer] (if you have MyChart)  If you have any lab test that is abnormal or we need to change your treatment, we will call you or send a MyChart message to review the results.  Testing/Procedures: None ordered.  Follow-Up: At [facility], you and your health needs are our priority.  As part of our continuing mission to provide you with exceptional heart care, we have created designated Provider Care Teams.  These Care Teams include your primary Cardiologist (physician) and Advanced Practice Providers (APPs -  Physician Assistants and Nurse Practitioners) who all work together to provide you with the care you need, when you need it.  Your next appointment:   1 year(s)  The format for your next appointment:   In Person  Provider:   Manya Sells, MD{or one of the following Advanced Practice Providers on your designated Care Team:   Mertha Abrahams, [place] Bambi Lever "Jonelle Neri" [place], [place] Neda Balk, NP  Note: Remote monitoring is used to monitor your Pacemaker/ ICD from home. This monitoring reduces the number of office visits required to check your device to one time per year. It allows us  to keep an eye on the functioning of your device to ensure it is working properly.             Valet parking services will be available as well.

## 2024-02-15 NOTE — Progress Notes (Signed)
 HPI Mrs. Gambrell returns today for followup. She is a pleasant 78 yo woman with  H/o NSVT, HTN, and PVC's. She has developed recurrent breast CA in the interim. She also had atrial fib. She was diagnosed with mitral annular dysjunction and prolonged VT. She underwent ICD insertion over a year ago. In the interim she developed worsening MR and undergone MV repair. Post op she has been found to have malfunction of her atrial pacing lead although the lead is in place on CXR and her impedence is stable. She returns today for followup.  Allergies  Allergen Reactions   Quinidine Palpitations    REACTION: increased heart rate   Statins     Muscle pain   Tape     If left on long enough,will tear skin    Amoxicillin Itching and Rash   Atenolol Hives   Dofetilide  Other (See Comments)    Elevated QTCs   Mexiletine Hcl Palpitations    Dizzy and extreme tiredness   Nadolol Hives     Current Outpatient Medications  Medication Sig Dispense Refill   acetaminophen  (TYLENOL ) 500 MG tablet Take 1,000 mg by mouth every 6 (six) hours as needed for mild pain or moderate pain.     ALPRAZolam  (XANAX ) 0.5 MG tablet Take 1 tablet (0.5 mg total) by mouth at bedtime as needed for sleep 30 tablet 1   apixaban  (ELIQUIS ) 5 MG TABS tablet Take 1 tablet (5 mg total) by mouth 2 (two) times daily. 180 tablet 1   Ascorbic Acid (VITAMIN C) 1000 MG tablet Take 1,000 mg by mouth daily.     aspirin  EC 81 MG tablet Take 1 tablet (81 mg total) by mouth daily. Swallow whole. (Patient taking differently: Take 81 mg by mouth at bedtime. Swallow whole.)     B Complex-C (SUPER B COMPLEX PO) Take 1 capsule by mouth at bedtime.     beta carotene 10000 UNIT capsule Take 10,000 Units by mouth at bedtime.     Biotin 5 MG CAPS Take 5 mg by mouth at bedtime.      Calcium  600-200 MG-UNIT tablet Take 1 tablet by mouth daily.     calcium  carbonate (TUMS - DOSED IN MG ELEMENTAL CALCIUM ) 500 MG chewable tablet Chew 2 tablets by  mouth daily as needed for indigestion or heartburn.     cetirizine (ZYRTEC) 10 MG tablet Take 10 mg by mouth at bedtime.     Cholecalciferol  (VITAMIN D) 50 MCG (2000 UT) CAPS Take 2,000 Units by mouth daily.     Coenzyme Q10 (COQ-10) 100 MG CAPS Take 100 mg by mouth daily.     dicyclomine  (BENTYL ) 10 MG capsule Take 10 mg by mouth every 6 (six) hours as needed.     Digestive Enzyme CAPS Take 1 capsule by mouth daily as needed (heartburn).     escitalopram  (LEXAPRO ) 20 MG tablet Take 1 tablet (20 mg total) by mouth daily. (Patient taking differently: Take 20 mg by mouth at bedtime.) 90 tablet 4   Evolocumab  (REPATHA  SURECLICK) 140 MG/ML SOAJ Inject 140 mg into the skin every 14 (fourteen) days. 6 mL 3   ezetimibe  (ZETIA ) 10 MG tablet Take 1 tablet (10 mg total) by mouth daily. (Patient taking differently: Take 10 mg by mouth at bedtime.) 90 tablet 4   famotidine  (PEPCID ) 20 MG tablet Take 20 mg by mouth as needed for heartburn or indigestion.     ferrous sulfate  325 (65 FE) MG tablet Take 1  tablet (325 mg total) by mouth 2 (two) times daily with a meal.     fluticasone  (FLONASE ) 50 MCG/ACT nasal spray Place 1 spray into both nostrils daily as needed for allergies.     furosemide  (LASIX ) 40 MG tablet Take 40 mg by mouth daily.     hydrocortisone cream 1 % Apply 1 Application topically daily as needed for itching.     MAGNESIUM  MALATE PO Take 1,000 mg by mouth at bedtime.     MANGANESE  PO Take 40 mg by mouth at bedtime.     Melatonin 10 MG TABS Take 10 mg by mouth at bedtime.      Menatetrenone (VITAMIN K2) 100 MCG TABS Take 100 mcg by mouth daily.     methocarbamol  (ROBAXIN ) 500 MG tablet Take 1 tablet (500 mg total) by mouth every 8 (eight) hours. as needed for neck pain 30 tablet 1   Multiple Vitamins-Minerals (WOMENS MULTIVITAMIN PO) Take 1 tablet by mouth daily after breakfast.     omeprazole  (PRILOSEC) 40 MG capsule Take 1 capsule (40 mg total) by mouth every morning 30 minutes before  breakfast 30 capsule 3   oxycodone  (OXY-IR) 5 MG capsule Take 5 mg by mouth every 6 (six) hours as needed for pain.     Polyvinyl Alcohol (LIQUID TEARS OP) Place 1 drop into both eyes daily as needed (dry eyes).     potassium chloride  SA (KLOR-CON  M) 20 MEQ tablet Take 1 tablet (20 mEq total) by mouth daily with lasix  (furosemide ). 90 tablet 3   predniSONE  (DELTASONE ) 10 MG tablet Take 4 tablets (40 mg total) by mouth daily for 3 days 12 tablet 1   Simethicone  125 MG CAPS Take 250 mg by mouth daily as needed (bloating).     sotalol  (BETAPACE ) 160 MG tablet Take 1 tablet (160 mg total) by mouth 2 (two) times daily. 180 tablet 2   thyroid  (ARMOUR) 120 MG tablet Take 120 mg by mouth daily before breakfast.     triamcinolone  (KENALOG ) 0.025 % cream Apply 1 Application topically 2 (two) times daily as needed (rash).     zinc  gluconate 50 MG tablet Take 50 mg by mouth daily.     No current facility-administered medications for this visit.     Past Medical History:  Diagnosis Date   A-fib [facility])    AICD (automatic cardioverter/defibrillator) present    Medtronic. Managed by Dr. Manya Sells   Anxiety    Arthritis    thumb   Basal cell carcinoma 2001   "forehead, between eyebrows" right arm (2022)   Breast cancer (HCC)    Carcinoma of thyroid  gland (HCC) 2001   Chronic lower back pain    "worse is across my hips" (05/26/2016)   Depression    Dyslipidemia    Dyspnea    Dysrhythmia    A. Fib   Family history of breast cancer    Family history of kidney cancer    Family history of leukemia    Family history of nonmelanoma skin cancer    Fibrocystic breast    Fibromyalgia    GERD (gastroesophageal reflux disease)    Heart murmur    History of blood transfusion 1992   "after subcutaneous mastectomies"   History of hiatal hernia    Hypertension    not on any medications   Hypothyroidism    Malignant melanoma of left ankle (HCC) 2001   Migraine    "visual; 2-3 times/year" (05/26/2016)    Mitral valve prolapse  PONV (postoperative nausea and vomiting)    Presence of permanent cardiac pacemaker    Medtronic   Ventricular tachycardia (HCC)    Hx of, controlled on sotalol  therapy    ROS:   All systems reviewed and negative except as noted in the HPI.   Past Surgical History:  Procedure Laterality Date   ABDOMINAL HYSTERECTOMY  1998   ANTERIOR CERVICAL DECOMP/DISCECTOMY FUSION  2001   ATRIAL FIBRILLATION ABLATION N/A 09/12/2021   Procedure: ATRIAL FIBRILLATION ABLATION;  Surgeon: Lei Pump, MD;  Location: [facility];  Service: Cardiovascular;  Laterality: N/A;   BASAL CELL CARCINOMA EXCISION  2001   "cut it out & did a flap, on forehead between my eyebrows"   BREAST IMPLANT EXCHANGE Bilateral 2001   [id number]   BREAST IMPLANT EXCHANGE Left 05/05/2021   Procedure: BREAST IMPLANT EXCHANGE;  Surgeon: Thornell Flirt, DO;  Location: [facility] OR;  Service: Plastics;  Laterality: Left;   BREAST IMPLANT REMOVAL Right 02/05/2020   Procedure: REMOVAL RIGHT BREAST IMPLANT AND CAPSULECTOMY;  Surgeon: Caralyn Chandler, MD;  Location: [facility];  Service: General;  Laterality: Right;   BREAST LUMPECTOMY Right 02/05/2020   Procedure: RIGHT BREAST CENTRAL LUMPECTOMY;  Surgeon: Caralyn Chandler, MD;  Location: [facility];  Service: General;  Laterality: Right;   BREAST RECONSTRUCTION WITH PLACEMENT OF TISSUE EXPANDER AND FLEX HD (ACELLULAR HYDRATED DERMIS) Right 01/20/2021   Procedure: BREAST RECONSTRUCTION WITH PLACEMENT OF TISSUE EXPANDER AND FLEX HD (ACELLULAR HYDRATED DERMIS);  Surgeon: Thornell Flirt, DO;  Location: [facility] OR;  Service: Plastics;  Laterality: Right;  2 hours   CARPAL TUNNEL RELEASE Right 2006   COLONOSCOPY     DILATION AND CURETTAGE OF UTERUS  1970s X 2-3   ELECTROPHYSIOLOGIC STUDY  1994 X 2;2001   "to see it it was sustained VT; cause thyroid  levels were causing arrhythmias"   EYE SURGERY  12/2020   cataracts    HYSTEROTOMY  1994   ICD IMPLANT N/A 12/28/2022   Procedure: ICD IMPLANT;  Surgeon: Tammie Fall, MD;  Location: [facility] INVASIVE CV LAB;  Service: Cardiovascular;  Laterality: N/A;   IR THORACENTESIS ASP PLEURAL SPACE W/IMG GUIDE  01/06/2024   LAPAROSCOPIC CHOLECYSTECTOMY  2004   MASTECTOMY Bilateral 1992   "subcutaneous"   MELANOMA EXCISION Left 2001   "ankle, stage I"   MITRAL VALVE REPAIR N/A 12/07/2023   Procedure: MITRAL VALVE REPAIR USING SIMULUS SEMI-RIGID ANNULOPLASTY BAND SIZE ;  Surgeon: Melene Sportsman, MD;  Location: [facility] OR;  Service: Open Heart Surgery;  Laterality: N/A;   PLACEMENT OF BREAST IMPLANTS Bilateral 1992   [id number]   PVC ABLATION N/A 12/23/2022   Procedure: PVC ABLATION;  Surgeon: Lei Pump, MD;  Location: [facility];  Service: Cardiovascular;  Laterality: N/A;   REMOVAL OF TISSUE EXPANDER AND PLACEMENT OF IMPLANT Right 05/05/2021   Procedure: REMOVAL OF TISSUE EXPANDER AND PLACEMENT OF IMPLANT;  Surgeon: Thornell Flirt, DO;  Location: [facility];  Service: Plastics;  Laterality: Right;   RIGHT/LEFT HEART CATH AND CORONARY ANGIOGRAPHY N/A 11/11/2023   Procedure: RIGHT/LEFT HEART CATH AND CORONARY ANGIOGRAPHY;  Surgeon: Odie Benne, MD;  Location: [facility];  Service: Cardiovascular;  Laterality: N/A;   TEE WITHOUT CARDIOVERSION N/A 12/07/2023   Procedure: TRANSESOPHAGEAL ECHOCARDIOGRAM (TEE);  Surgeon: Melene Sportsman, MD;  Location: [facility] OR;  Service: Open Heart Surgery;  Laterality: N/A;   TOTAL THYROIDECTOMY  12/1999   "cancer"  TRANSESOPHAGEAL ECHOCARDIOGRAM (CATH LAB) N/A 10/06/2023   Procedure: TRANSESOPHAGEAL ECHOCARDIOGRAM;  Surgeon: Olinda Bertrand, DO;  Location: [facility];  Service: Cardiovascular;  Laterality: N/A;   VENTRICULAR ABLATION SURGERY  2011   ventricular tchycardia     Family History  Problem Relation Age of Onset   Other Brother        AGENT ORANGE and AntiLupus   Heart disease Brother         Stents and bypass x2   Arrhythmia Brother        AFIB   Heart attack Brother    Hypertension Brother    Squamous cell carcinoma Brother 20       Skin   Hyperlipidemia Mother 72   Osteoporosis Mother    Basal cell carcinoma Mother 72   Heart disease Father 80   Other Father        Cardiac arrest   Breast cancer Maternal Grandmother 85       metastatic   Breast cancer Other        dx. unknown age; maternal great-aunt   Lung cancer Maternal Aunt 68       hx. of smoking   Other Paternal Aunt        bilateral mastectomies due to multiple breast lumps   Leukemia Maternal Aunt    Cancer Maternal Aunt        unknown types   Thyroid  cancer Neg Hx      Social History   Socioeconomic History   Marital status: Married    Spouse name: Education officer, community (Spouse)   Number of children: Not on file   Years of education: Not on file   Highest education level: Not on file  Occupational History   Occupation: Adult nurse GI    Employer: [employer]  Tobacco Use   Smoking status: Never    Passive exposure: Never   Smokeless tobacco: Never  Vaping Use   Vaping status: Never Used  Substance and Sexual Activity   Alcohol use: No   Drug use: No   Sexual activity: Yes  Other Topics Concern   Not on file  Social History Narrative   Married   Social Drivers of Health   Financial Resource Strain: Not on file  Food Insecurity: No Food Insecurity (01/05/2024)   Hunger Vital Sign    Worried About Running Out of Food in the Last Year: Never true    Ran Out of Food in the Last Year: Never true  Transportation Needs: No Transportation Needs (01/05/2024)   PRAPARE - Administrator, Civil Service (Medical): No    Lack of Transportation (Non-Medical): No  Physical Activity: Not on file  Stress: Not on file  Social Connections: Moderately Integrated (01/05/2024)   Social Connection and Isolation Panel [NHANES]    Frequency of Communication with Friends and Family: Three times a week     Frequency of Social Gatherings with Friends and Family: Three times a week    Attends Religious Services: More than 4 times per year    Active Member of Clubs or Organizations: No    Attends Banker Meetings: Never    Marital Status: Married  Catering manager Violence: Not At Risk (01/05/2024)   Humiliation, Afraid, Rape, and Kick questionnaire    Fear of Current or Ex-Partner: No    Emotionally Abused: No    Physically Abused: No    Sexually Abused: No     BP 118/64   Pulse 82  Ht 5' 4.5" (1.638 m)   Wt 140 lb (63.5 kg)   SpO2 96%   BMI 23.66 kg/m   Physical Exam:  Well appearing NAD HEENT: Unremarkable Neck:  No JVD, no thyromegally Lymphatics:  No adenopathy Back:  No CVA tenderness Lungs:  Clear with no wheezes HEART:  Regular rate rhythm, no murmurs, no rubs, no clicks Abd:  soft, positive bowel sounds, no organomegally, no rebound, no guarding Ext:  2 plus pulses, no edema, no cyanosis, no clubbing Skin:  No rashes no nodules Neuro:  CN II through XII intact, motor grossly intact  EKG - nsr with QT prolongation  DEVICE  Normal device function.  See PaceArt for details.   Assess/Plan:.  PAF - she is maintaining NSR on sotalol . Today she will reduce to 120 bid from 160 bid as her QT a little long. 2. PVC's - she appears to have minimal PVC's on sotalol  3. Dyspnea - unclear if she has developed any LV dysfunction by echo. I have asked her to undergo 2D echo.  4. HTN - her bp is mildly elevated. We will not agressively try to lower it further.  5. ICD - her atrial lead malfunction is improved. Her threshold 1.75 today. We reprogrammed her device.    Pete Brand Arihant Pennings,MD

## 2024-02-18 ENCOUNTER — Other Ambulatory Visit: Payer: Self-pay | Admitting: Thoracic Surgery (Cardiothoracic Vascular Surgery)

## 2024-02-18 DIAGNOSIS — Z9889 Other specified postprocedural states: Secondary | ICD-10-CM

## 2024-02-19 ENCOUNTER — Encounter: Payer: Self-pay | Admitting: Internal Medicine

## 2024-02-21 ENCOUNTER — Ambulatory Visit: Payer: Self-pay | Attending: Thoracic Surgery (Cardiothoracic Vascular Surgery) | Admitting: Physician Assistant

## 2024-02-21 ENCOUNTER — Other Ambulatory Visit (HOSPITAL_COMMUNITY): Payer: Self-pay

## 2024-02-21 ENCOUNTER — Ambulatory Visit (HOSPITAL_COMMUNITY)
Admission: RE | Admit: 2024-02-21 | Discharge: 2024-02-21 | Disposition: A | Discharge status: Home / Self Care | Source: Ambulatory Visit | Attending: Thoracic Surgery (Cardiothoracic Vascular Surgery) | Admitting: Thoracic Surgery (Cardiothoracic Vascular Surgery)

## 2024-02-21 ENCOUNTER — Other Ambulatory Visit: Payer: Self-pay

## 2024-02-21 VITALS — BP 119/73 | HR 88 | Resp 20 | Ht 64.5 in | Wt 139.0 lb

## 2024-02-21 DIAGNOSIS — J90 Pleural effusion, not elsewhere classified: Secondary | ICD-10-CM | POA: Diagnosis not present

## 2024-02-21 DIAGNOSIS — I059 Rheumatic mitral valve disease, unspecified: Secondary | ICD-10-CM | POA: Diagnosis not present

## 2024-02-21 DIAGNOSIS — Z9889 Other specified postprocedural states: Secondary | ICD-10-CM

## 2024-02-21 DIAGNOSIS — Z95810 Presence of automatic (implantable) cardiac defibrillator: Secondary | ICD-10-CM | POA: Diagnosis not present

## 2024-02-21 DIAGNOSIS — I340 Nonrheumatic mitral (valve) insufficiency: Secondary | ICD-10-CM | POA: Diagnosis not present

## 2024-02-21 MED ORDER — FUROSEMIDE 40 MG PO TABS
40.0000 mg | ORAL_TABLET | Freq: Every day | ORAL | 3 refills | Status: DC
  Filled 2024-02-21: qty 30, 30d supply, fill #0
  Filled 2024-03-27: qty 30, 30d supply, fill #1
  Filled 2024-04-15 – 2024-04-19 (×2): qty 30, 30d supply, fill #2
  Filled 2024-05-22: qty 30, 30d supply, fill #3

## 2024-02-21 NOTE — Telephone Encounter (Signed)
 Patient identification verified by 2 forms. Sims Duck, RN     Called and spoke to patient  Patient states:  - She sent this message as F/U to the plan notated in Dr. Tristan Furlough last OV.  - She does not feel she needs another ECHO at this time.  - She wants Dr. Carolynne Citron to be aware of these answers in relation to the plan # 3-5 listed below: Assess/Plan:.   PAF - she is maintaining NSR on sotalol . Today she will reduce to 120 bid from 160 bid as her QT a little long. 2. PVC's - she appears to have minimal PVC's on sotalol  3. Dyspnea - unclear if she has developed any LV dysfunction by echo. I have asked her to undergo 2D echo.  4. HTN - her bp is mildly elevated. We will not agressively try to lower it further.  5. ICD - her atrial lead malfunction is improved. Her threshold 1.75 today. We reprogrammed her device.                Interventions/Plan: - Encounter forwarded to primary cardiologist for review.    Patient agrees with plan, no questions at this time

## 2024-02-28 ENCOUNTER — Other Ambulatory Visit (HOSPITAL_COMMUNITY): Payer: Self-pay

## 2024-03-01 DIAGNOSIS — L820 Inflamed seborrheic keratosis: Secondary | ICD-10-CM | POA: Diagnosis not present

## 2024-03-01 DIAGNOSIS — L821 Other seborrheic keratosis: Secondary | ICD-10-CM | POA: Diagnosis not present

## 2024-03-01 DIAGNOSIS — D225 Melanocytic nevi of trunk: Secondary | ICD-10-CM | POA: Diagnosis not present

## 2024-03-01 DIAGNOSIS — L814 Other melanin hyperpigmentation: Secondary | ICD-10-CM | POA: Diagnosis not present

## 2024-03-01 DIAGNOSIS — D2239 Melanocytic nevi of other parts of face: Secondary | ICD-10-CM | POA: Diagnosis not present

## 2024-03-02 ENCOUNTER — Other Ambulatory Visit: Payer: Self-pay

## 2024-03-03 ENCOUNTER — Other Ambulatory Visit: Payer: Self-pay

## 2024-03-03 ENCOUNTER — Other Ambulatory Visit (HOSPITAL_COMMUNITY): Payer: Self-pay

## 2024-03-03 DIAGNOSIS — D539 Nutritional anemia, unspecified: Secondary | ICD-10-CM | POA: Diagnosis not present

## 2024-03-03 DIAGNOSIS — E039 Hypothyroidism, unspecified: Secondary | ICD-10-CM | POA: Diagnosis not present

## 2024-03-03 DIAGNOSIS — Z6823 Body mass index (BMI) 23.0-23.9, adult: Secondary | ICD-10-CM | POA: Diagnosis not present

## 2024-03-03 DIAGNOSIS — I10 Essential (primary) hypertension: Secondary | ICD-10-CM | POA: Diagnosis not present

## 2024-03-03 DIAGNOSIS — R0609 Other forms of dyspnea: Secondary | ICD-10-CM | POA: Diagnosis not present

## 2024-03-03 DIAGNOSIS — Z85850 Personal history of malignant neoplasm of thyroid: Secondary | ICD-10-CM | POA: Diagnosis not present

## 2024-03-03 DIAGNOSIS — J90 Pleural effusion, not elsewhere classified: Secondary | ICD-10-CM | POA: Diagnosis not present

## 2024-03-03 DIAGNOSIS — R1084 Generalized abdominal pain: Secondary | ICD-10-CM | POA: Diagnosis not present

## 2024-03-03 MED ORDER — CEPHALEXIN 500 MG PO CAPS
2000.0000 mg | ORAL_CAPSULE | Freq: Once | ORAL | 0 refills | Status: DC
  Filled 2024-03-03: qty 4, 1d supply, fill #0

## 2024-03-06 ENCOUNTER — Other Ambulatory Visit (HOSPITAL_COMMUNITY): Payer: Self-pay

## 2024-03-08 ENCOUNTER — Other Ambulatory Visit (HOSPITAL_COMMUNITY): Payer: Self-pay

## 2024-03-09 ENCOUNTER — Other Ambulatory Visit (HOSPITAL_COMMUNITY): Payer: Self-pay

## 2024-03-09 MED ORDER — OMEPRAZOLE 40 MG PO CPDR
40.0000 mg | DELAYED_RELEASE_CAPSULE | Freq: Every morning | ORAL | 4 refills | Status: DC
Start: 2024-03-09 — End: 2024-08-28
  Filled 2024-03-09 – 2024-04-05 (×2): qty 30, 30d supply, fill #0
  Filled 2024-05-03: qty 30, 30d supply, fill #1
  Filled 2024-05-31: qty 30, 30d supply, fill #2
  Filled 2024-07-01: qty 30, 30d supply, fill #3
  Filled 2024-08-01: qty 30, 30d supply, fill #4

## 2024-03-13 DIAGNOSIS — Z961 Presence of intraocular lens: Secondary | ICD-10-CM | POA: Diagnosis not present

## 2024-03-13 DIAGNOSIS — H524 Presbyopia: Secondary | ICD-10-CM | POA: Diagnosis not present

## 2024-03-13 DIAGNOSIS — H5231 Anisometropia: Secondary | ICD-10-CM | POA: Diagnosis not present

## 2024-03-13 DIAGNOSIS — Z9849 Cataract extraction status, unspecified eye: Secondary | ICD-10-CM | POA: Diagnosis not present

## 2024-03-13 DIAGNOSIS — H52221 Regular astigmatism, right eye: Secondary | ICD-10-CM | POA: Diagnosis not present

## 2024-03-15 ENCOUNTER — Encounter: Payer: PPO | Admitting: Internal Medicine

## 2024-03-18 ENCOUNTER — Other Ambulatory Visit (HOSPITAL_COMMUNITY): Payer: Self-pay

## 2024-03-19 ENCOUNTER — Other Ambulatory Visit: Payer: Self-pay

## 2024-03-27 ENCOUNTER — Other Ambulatory Visit (HOSPITAL_COMMUNITY): Payer: Self-pay

## 2024-03-28 ENCOUNTER — Ambulatory Visit (INDEPENDENT_AMBULATORY_CARE_PROVIDER_SITE_OTHER): Payer: PPO

## 2024-03-28 ENCOUNTER — Encounter: Payer: Self-pay | Admitting: Internal Medicine

## 2024-03-28 DIAGNOSIS — I472 Ventricular tachycardia, unspecified: Secondary | ICD-10-CM

## 2024-03-28 LAB — CUP PACEART REMOTE DEVICE CHECK
Battery Remaining Longevity: 114 mo
Battery Voltage: 3.02 V
Brady Statistic RV Percent Paced: 0.08 %
Date Time Interrogation Session: 20250603073802
HighPow Impedance: 69 Ohm
Implantable Lead Connection Status: 753985
Implantable Lead Connection Status: 753985
Implantable Lead Implant Date: 20240304
Implantable Lead Implant Date: 20240304
Implantable Lead Location: 753859
Implantable Lead Location: 753860
Implantable Lead Model: 5076
Implantable Pulse Generator Implant Date: 20240304
Lead Channel Impedance Value: 266 Ohm
Lead Channel Impedance Value: 342 Ohm
Lead Channel Impedance Value: 418 Ohm
Lead Channel Pacing Threshold Amplitude: 0.875 V
Lead Channel Pacing Threshold Amplitude: 2.5 V
Lead Channel Pacing Threshold Pulse Width: 0.4 ms
Lead Channel Pacing Threshold Pulse Width: 0.4 ms
Lead Channel Sensing Intrinsic Amplitude: 0.4 mV
Lead Channel Sensing Intrinsic Amplitude: 9.3 mV
Lead Channel Setting Pacing Amplitude: 2 V
Lead Channel Setting Pacing Amplitude: 3.5 V
Lead Channel Setting Pacing Pulse Width: 0.4 ms
Lead Channel Setting Sensing Sensitivity: 0.3 mV
Zone Setting Status: 755011

## 2024-03-30 ENCOUNTER — Ambulatory Visit: Payer: Self-pay | Admitting: Internal Medicine

## 2024-04-05 ENCOUNTER — Other Ambulatory Visit (HOSPITAL_COMMUNITY): Payer: Self-pay

## 2024-04-05 ENCOUNTER — Other Ambulatory Visit: Payer: Self-pay

## 2024-04-06 ENCOUNTER — Other Ambulatory Visit (HOSPITAL_COMMUNITY): Payer: Self-pay

## 2024-04-07 ENCOUNTER — Other Ambulatory Visit: Payer: Self-pay

## 2024-04-07 ENCOUNTER — Other Ambulatory Visit (HOSPITAL_COMMUNITY): Payer: Self-pay

## 2024-04-07 MED ORDER — ESCITALOPRAM OXALATE 20 MG PO TABS
20.0000 mg | ORAL_TABLET | Freq: Every day | ORAL | 4 refills | Status: AC
  Filled 2024-04-07: qty 90, 90d supply, fill #0
  Filled 2024-07-01: qty 90, 90d supply, fill #1
  Filled 2024-10-04: qty 90, 90d supply, fill #2

## 2024-04-14 ENCOUNTER — Other Ambulatory Visit (HOSPITAL_COMMUNITY): Payer: Self-pay

## 2024-04-17 ENCOUNTER — Other Ambulatory Visit: Payer: Self-pay

## 2024-04-17 ENCOUNTER — Other Ambulatory Visit (HOSPITAL_COMMUNITY): Payer: Self-pay

## 2024-04-18 ENCOUNTER — Other Ambulatory Visit: Payer: Self-pay

## 2024-04-18 ENCOUNTER — Other Ambulatory Visit (HOSPITAL_COMMUNITY): Payer: Self-pay

## 2024-04-18 MED ORDER — ALPRAZOLAM 0.5 MG PO TABS
0.5000 mg | ORAL_TABLET | Freq: Every evening | ORAL | 0 refills | Status: DC | PRN
  Filled 2024-04-18: qty 30, 30d supply, fill #0

## 2024-05-02 ENCOUNTER — Other Ambulatory Visit: Payer: Self-pay | Admitting: Internal Medicine

## 2024-05-02 ENCOUNTER — Other Ambulatory Visit: Payer: Self-pay

## 2024-05-02 DIAGNOSIS — I4819 Other persistent atrial fibrillation: Secondary | ICD-10-CM

## 2024-05-02 DIAGNOSIS — Z9889 Other specified postprocedural states: Secondary | ICD-10-CM

## 2024-05-02 MED ORDER — APIXABAN 5 MG PO TABS
5.0000 mg | ORAL_TABLET | Freq: Two times a day (BID) | ORAL | 1 refills | Status: DC
  Filled 2024-05-02: qty 180, 90d supply, fill #0
  Filled 2024-08-01: qty 180, 90d supply, fill #1

## 2024-05-02 NOTE — Telephone Encounter (Signed)
 Prescription refill request for Eliquis  received. Indication: a fib Last office visit: 02/15/24 Scr: 0.6 epic 01/07/24 Age: 78 Weight: 63 kg

## 2024-05-03 ENCOUNTER — Other Ambulatory Visit (HOSPITAL_COMMUNITY): Payer: Self-pay

## 2024-05-08 ENCOUNTER — Other Ambulatory Visit (HOSPITAL_COMMUNITY): Payer: Self-pay

## 2024-05-22 ENCOUNTER — Other Ambulatory Visit (HOSPITAL_COMMUNITY): Payer: Self-pay

## 2024-05-23 NOTE — Progress Notes (Signed)
 Remote ICD transmission.

## 2024-05-26 ENCOUNTER — Other Ambulatory Visit (HOSPITAL_COMMUNITY): Payer: Self-pay

## 2024-05-31 ENCOUNTER — Other Ambulatory Visit (HOSPITAL_COMMUNITY): Payer: Self-pay

## 2024-05-31 ENCOUNTER — Other Ambulatory Visit: Payer: Self-pay

## 2024-06-08 ENCOUNTER — Other Ambulatory Visit (HOSPITAL_COMMUNITY): Payer: Self-pay

## 2024-06-09 ENCOUNTER — Other Ambulatory Visit (HOSPITAL_COMMUNITY): Payer: Self-pay

## 2024-06-09 ENCOUNTER — Other Ambulatory Visit: Payer: Self-pay

## 2024-06-09 MED ORDER — CEPHALEXIN 500 MG PO CAPS
2000.0000 mg | ORAL_CAPSULE | Freq: Once | ORAL | 0 refills | Status: AC
  Filled 2024-06-09: qty 4, 1d supply, fill #0

## 2024-06-19 ENCOUNTER — Other Ambulatory Visit: Payer: Self-pay | Admitting: Internal Medicine

## 2024-06-19 ENCOUNTER — Other Ambulatory Visit (HOSPITAL_COMMUNITY): Payer: Self-pay

## 2024-06-19 ENCOUNTER — Other Ambulatory Visit: Payer: Self-pay | Admitting: Physician Assistant

## 2024-06-19 MED ORDER — ALPRAZOLAM 0.5 MG PO TABS
0.5000 mg | ORAL_TABLET | Freq: Every evening | ORAL | 1 refills | Status: DC | PRN
  Filled 2024-06-19: qty 30, 30d supply, fill #0
  Filled 2024-08-17: qty 30, 30d supply, fill #1

## 2024-06-21 ENCOUNTER — Other Ambulatory Visit (HOSPITAL_COMMUNITY): Payer: Self-pay

## 2024-06-21 MED ORDER — FUROSEMIDE 40 MG PO TABS
40.0000 mg | ORAL_TABLET | Freq: Every day | ORAL | 3 refills | Status: DC
  Filled 2024-06-21: qty 30, 30d supply, fill #0
  Filled 2024-07-17: qty 30, 30d supply, fill #1
  Filled 2024-08-16: qty 30, 30d supply, fill #2
  Filled 2024-09-14: qty 30, 30d supply, fill #3

## 2024-06-27 ENCOUNTER — Ambulatory Visit (INDEPENDENT_AMBULATORY_CARE_PROVIDER_SITE_OTHER): Payer: PPO

## 2024-06-27 DIAGNOSIS — I4819 Other persistent atrial fibrillation: Secondary | ICD-10-CM | POA: Diagnosis not present

## 2024-06-29 LAB — CUP PACEART REMOTE DEVICE CHECK
Battery Remaining Longevity: 110 mo
Battery Voltage: 3.01 V
Brady Statistic RV Percent Paced: 0.25 %
Date Time Interrogation Session: 20250901210410
HighPow Impedance: 65 Ohm
Implantable Lead Connection Status: 753985
Implantable Lead Connection Status: 753985
Implantable Lead Implant Date: 20240304
Implantable Lead Implant Date: 20240304
Implantable Lead Location: 753859
Implantable Lead Location: 753860
Implantable Lead Model: 5076
Implantable Pulse Generator Implant Date: 20240304
Lead Channel Impedance Value: 247 Ohm
Lead Channel Impedance Value: 323 Ohm
Lead Channel Impedance Value: 399 Ohm
Lead Channel Pacing Threshold Amplitude: 0.875 V
Lead Channel Pacing Threshold Amplitude: 2.5 V
Lead Channel Pacing Threshold Pulse Width: 0.4 ms
Lead Channel Pacing Threshold Pulse Width: 0.4 ms
Lead Channel Sensing Intrinsic Amplitude: 0.3 mV
Lead Channel Sensing Intrinsic Amplitude: 8.1 mV
Lead Channel Setting Pacing Amplitude: 2 V
Lead Channel Setting Pacing Amplitude: 3.5 V
Lead Channel Setting Pacing Pulse Width: 0.4 ms
Lead Channel Setting Sensing Sensitivity: 0.3 mV
Zone Setting Status: 755011

## 2024-07-01 ENCOUNTER — Other Ambulatory Visit (HOSPITAL_COMMUNITY): Payer: Self-pay

## 2024-07-02 ENCOUNTER — Ambulatory Visit: Payer: Self-pay | Admitting: Internal Medicine

## 2024-07-04 NOTE — Progress Notes (Signed)
Remote ICD Transmission.

## 2024-07-06 DIAGNOSIS — E039 Hypothyroidism, unspecified: Secondary | ICD-10-CM | POA: Diagnosis not present

## 2024-07-06 DIAGNOSIS — R0789 Other chest pain: Secondary | ICD-10-CM | POA: Diagnosis not present

## 2024-07-06 DIAGNOSIS — Z85850 Personal history of malignant neoplasm of thyroid: Secondary | ICD-10-CM | POA: Diagnosis not present

## 2024-07-06 DIAGNOSIS — D539 Nutritional anemia, unspecified: Secondary | ICD-10-CM | POA: Diagnosis not present

## 2024-07-06 DIAGNOSIS — Z9889 Other specified postprocedural states: Secondary | ICD-10-CM | POA: Diagnosis not present

## 2024-07-06 DIAGNOSIS — I493 Ventricular premature depolarization: Secondary | ICD-10-CM | POA: Diagnosis not present

## 2024-07-06 DIAGNOSIS — I10 Essential (primary) hypertension: Secondary | ICD-10-CM | POA: Diagnosis not present

## 2024-07-06 DIAGNOSIS — F330 Major depressive disorder, recurrent, mild: Secondary | ICD-10-CM | POA: Diagnosis not present

## 2024-07-06 DIAGNOSIS — R7309 Other abnormal glucose: Secondary | ICD-10-CM | POA: Diagnosis not present

## 2024-07-06 DIAGNOSIS — E785 Hyperlipidemia, unspecified: Secondary | ICD-10-CM | POA: Diagnosis not present

## 2024-07-06 DIAGNOSIS — Z6823 Body mass index (BMI) 23.0-23.9, adult: Secondary | ICD-10-CM | POA: Diagnosis not present

## 2024-07-06 DIAGNOSIS — I480 Paroxysmal atrial fibrillation: Secondary | ICD-10-CM | POA: Diagnosis not present

## 2024-07-14 ENCOUNTER — Other Ambulatory Visit (HOSPITAL_COMMUNITY): Payer: Self-pay

## 2024-07-17 ENCOUNTER — Other Ambulatory Visit: Payer: Self-pay

## 2024-07-26 ENCOUNTER — Other Ambulatory Visit (HOSPITAL_COMMUNITY): Payer: Self-pay

## 2024-08-01 ENCOUNTER — Other Ambulatory Visit (HOSPITAL_COMMUNITY): Payer: Self-pay

## 2024-08-02 ENCOUNTER — Other Ambulatory Visit (HOSPITAL_COMMUNITY): Payer: Self-pay

## 2024-08-09 ENCOUNTER — Other Ambulatory Visit (HOSPITAL_COMMUNITY): Payer: Self-pay

## 2024-08-16 ENCOUNTER — Other Ambulatory Visit (HOSPITAL_COMMUNITY): Payer: Self-pay

## 2024-08-18 ENCOUNTER — Other Ambulatory Visit: Payer: Self-pay

## 2024-08-26 ENCOUNTER — Other Ambulatory Visit (HOSPITAL_COMMUNITY): Payer: Self-pay

## 2024-08-28 ENCOUNTER — Other Ambulatory Visit (HOSPITAL_COMMUNITY): Payer: Self-pay

## 2024-08-28 ENCOUNTER — Other Ambulatory Visit: Payer: Self-pay

## 2024-08-28 MED ORDER — OMEPRAZOLE 40 MG PO CPDR
40.0000 mg | DELAYED_RELEASE_CAPSULE | Freq: Every day | ORAL | 0 refills | Status: AC
  Filled 2024-08-28: qty 30, 30d supply, fill #0

## 2024-09-03 ENCOUNTER — Other Ambulatory Visit (HOSPITAL_COMMUNITY): Payer: Self-pay

## 2024-09-12 ENCOUNTER — Other Ambulatory Visit: Payer: Self-pay

## 2024-09-12 ENCOUNTER — Encounter (HOSPITAL_BASED_OUTPATIENT_CLINIC_OR_DEPARTMENT_OTHER): Payer: Self-pay

## 2024-09-12 ENCOUNTER — Telehealth: Payer: Self-pay | Admitting: Pharmacist

## 2024-09-12 ENCOUNTER — Telehealth: Payer: Self-pay | Admitting: Pharmacy Technician

## 2024-09-12 ENCOUNTER — Other Ambulatory Visit (HOSPITAL_COMMUNITY): Payer: Self-pay

## 2024-09-13 ENCOUNTER — Other Ambulatory Visit (HOSPITAL_COMMUNITY): Payer: Self-pay

## 2024-09-13 NOTE — Telephone Encounter (Signed)
 Pharmacy Patient Advocate Encounter  Received notification from [employer] that Prior Authorization for repatha  has been APPROVED from 09/12/24 to 09/12/25. Ran test claim, Copay is $0.00- one month. This test claim was processed through [facility]- copay amounts may vary at other pharmacies due to pharmacy/plan contracts, or as the patient moves through the different stages of their insurance plan.   PA #/Case ID/Reference #: [id number]

## 2024-09-14 ENCOUNTER — Other Ambulatory Visit (HOSPITAL_COMMUNITY): Payer: Self-pay

## 2024-09-20 ENCOUNTER — Other Ambulatory Visit (HOSPITAL_COMMUNITY): Payer: Self-pay

## 2024-09-22 ENCOUNTER — Other Ambulatory Visit: Payer: Self-pay

## 2024-09-25 ENCOUNTER — Encounter: Payer: Self-pay | Admitting: Internal Medicine

## 2024-09-25 ENCOUNTER — Telehealth: Payer: Self-pay

## 2024-09-25 NOTE — Telephone Encounter (Signed)
 Transmission received:  Presenting rhythm- AS/VS w/ 1 PVC. AF burden since 06/26/24 is 0.3 %- RA sensitivity programmed at 0.15 mV.   Last AF episode was 09/04/24.  ________________________________________________________________________  I called and spoke with the patient and advised her that no AF was noted over the Thanksgiving holiday- last episode was the evening on 09/04/24, which the patient recalled.   I inquired what she was feeling last week and she advised that one day she was out shopping and started to feel nauseated so she drank a Coke but could not recall if that helped with her symptoms.  The next day, she advised she was baking and became very dizzy.   I inquired if she checks her BP at home, but she has not been checking this lately.  Confirmed she does take lasix  40 mg once daily.  She will take an extra 20 mg daily as needed for weight gain.  Patricia Alvarado confirms her weights have been stable ~ 139-141 lbs. She did take a total of lasix  60 mg on 11/27 & 11/29.  She denies any recent illness or med changes and feels she is drinking the same amount of fluids.    I advised the patient if her weights are stable, she may not be requiring as much lasix  on a daily basis. Last BMP was 01/07/24- BUN/ Creatinine were WNL (8/0.6). I have asked her to monitor her BP for the next 3-4 days and send us  her readings via MyChart.  She tried to check her BP while on the phone with me, but her BP monitor needed new batteries.  She will check a reading this afternoon and try to send through MyChart as a baseline.   May be worth checking a repeat CBC (last on file 01/07/24) due to history of anemia and she is on Eliquis .   Will await BP reading and follow up MD after.  ___________________________________________________________________________

## 2024-09-25 NOTE — Telephone Encounter (Signed)
 Pt called in stating that she had some dizzy spells over the holiday and thinks she might be in afib. Pt is sending in transmission

## 2024-09-25 NOTE — Telephone Encounter (Signed)
 Per MyChart message received from Patricia Alvarado:  CBC done 07/06/24    RBC 4.38                                             Hgb 12.9                                             Hct40.3 All  of the CBC was within normal limits   Will await follow up blood pressures over the next 3-4 days.

## 2024-09-26 ENCOUNTER — Ambulatory Visit: Payer: PPO

## 2024-09-26 DIAGNOSIS — I4819 Other persistent atrial fibrillation: Secondary | ICD-10-CM | POA: Diagnosis not present

## 2024-09-27 ENCOUNTER — Other Ambulatory Visit (HOSPITAL_COMMUNITY): Payer: Self-pay

## 2024-09-27 LAB — CUP PACEART REMOTE DEVICE CHECK
Battery Remaining Longevity: 106 mo
Battery Voltage: 3.01 V
Brady Statistic RV Percent Paced: 0.3 %
Date Time Interrogation Session: 20251201124741
HighPow Impedance: 68 Ohm
Implantable Lead Connection Status: 753985
Implantable Lead Connection Status: 753985
Implantable Lead Implant Date: 20240304
Implantable Lead Implant Date: 20240304
Implantable Lead Location: 753859
Implantable Lead Location: 753860
Implantable Lead Model: 5076
Implantable Pulse Generator Implant Date: 20240304
Lead Channel Impedance Value: 247 Ohm
Lead Channel Impedance Value: 323 Ohm
Lead Channel Impedance Value: 399 Ohm
Lead Channel Pacing Threshold Amplitude: 0.875 V
Lead Channel Pacing Threshold Amplitude: 2.5 V
Lead Channel Pacing Threshold Pulse Width: 0.4 ms
Lead Channel Pacing Threshold Pulse Width: 0.4 ms
Lead Channel Sensing Intrinsic Amplitude: 0.3 mV
Lead Channel Sensing Intrinsic Amplitude: 8.3 mV
Lead Channel Setting Pacing Amplitude: 2 V
Lead Channel Setting Pacing Amplitude: 3.5 V
Lead Channel Setting Pacing Pulse Width: 0.4 ms
Lead Channel Setting Sensing Sensitivity: 0.3 mV
Zone Setting Status: 755011

## 2024-09-28 ENCOUNTER — Other Ambulatory Visit (HOSPITAL_COMMUNITY): Payer: Self-pay

## 2024-09-29 ENCOUNTER — Ambulatory Visit: Payer: Self-pay | Admitting: Internal Medicine

## 2024-09-29 NOTE — Progress Notes (Signed)
 Remote ICD Transmission

## 2024-10-04 ENCOUNTER — Other Ambulatory Visit (HOSPITAL_COMMUNITY): Payer: Self-pay

## 2024-10-04 ENCOUNTER — Other Ambulatory Visit: Payer: Self-pay | Admitting: Physician Assistant

## 2024-10-04 ENCOUNTER — Other Ambulatory Visit: Payer: Self-pay

## 2024-10-04 ENCOUNTER — Encounter: Payer: Self-pay | Admitting: Internal Medicine

## 2024-10-04 MED ORDER — ESCITALOPRAM OXALATE 20 MG PO TABS
20.0000 mg | ORAL_TABLET | Freq: Every day | ORAL | 4 refills | Status: AC
  Filled 2024-10-04: qty 90, 90d supply, fill #0

## 2024-10-04 MED ORDER — OMEPRAZOLE 40 MG PO CPDR
40.0000 mg | DELAYED_RELEASE_CAPSULE | Freq: Every day | ORAL | 4 refills | Status: AC
  Filled 2024-10-04 – 2024-11-14 (×3): qty 90, 90d supply, fill #0

## 2024-10-04 MED ORDER — OMEPRAZOLE 40 MG PO CPDR
40.0000 mg | DELAYED_RELEASE_CAPSULE | Freq: Every morning | ORAL | 0 refills | Status: AC
  Filled 2024-10-04: qty 90, 90d supply, fill #0

## 2024-10-06 ENCOUNTER — Other Ambulatory Visit (HOSPITAL_COMMUNITY): Payer: Self-pay

## 2024-10-12 ENCOUNTER — Other Ambulatory Visit: Payer: Self-pay

## 2024-10-12 ENCOUNTER — Other Ambulatory Visit (HOSPITAL_COMMUNITY): Payer: Self-pay

## 2024-10-12 ENCOUNTER — Other Ambulatory Visit: Payer: Self-pay | Admitting: Physician Assistant

## 2024-10-12 ENCOUNTER — Other Ambulatory Visit (HOSPITAL_BASED_OUTPATIENT_CLINIC_OR_DEPARTMENT_OTHER): Payer: Self-pay

## 2024-10-12 MED ORDER — FUROSEMIDE 40 MG PO TABS
40.0000 mg | ORAL_TABLET | Freq: Every day | ORAL | 3 refills | Status: AC
  Filled 2024-10-12: qty 90, 90d supply, fill #0

## 2024-10-12 NOTE — Telephone Encounter (Signed)
 Medication refilled

## 2024-10-13 ENCOUNTER — Other Ambulatory Visit (HOSPITAL_COMMUNITY): Payer: Self-pay

## 2024-10-13 MED ORDER — ALPRAZOLAM 0.5 MG PO TABS
0.5000 mg | ORAL_TABLET | Freq: Every evening | ORAL | 1 refills | Status: AC | PRN
  Filled 2024-10-13: qty 30, 30d supply, fill #0

## 2024-10-28 ENCOUNTER — Other Ambulatory Visit: Payer: Self-pay | Admitting: Internal Medicine

## 2024-10-28 DIAGNOSIS — Z9889 Other specified postprocedural states: Secondary | ICD-10-CM

## 2024-10-28 DIAGNOSIS — I4819 Other persistent atrial fibrillation: Secondary | ICD-10-CM

## 2024-10-29 ENCOUNTER — Other Ambulatory Visit (HOSPITAL_COMMUNITY): Payer: Self-pay

## 2024-10-30 ENCOUNTER — Other Ambulatory Visit: Payer: Self-pay

## 2024-10-30 ENCOUNTER — Other Ambulatory Visit (HOSPITAL_COMMUNITY): Payer: Self-pay

## 2024-10-30 ENCOUNTER — Other Ambulatory Visit (HOSPITAL_BASED_OUTPATIENT_CLINIC_OR_DEPARTMENT_OTHER): Payer: Self-pay

## 2024-10-30 MED ORDER — EZETIMIBE 10 MG PO TABS
10.0000 mg | ORAL_TABLET | Freq: Every day | ORAL | 4 refills | Status: AC
  Filled 2024-10-30: qty 90, 90d supply, fill #0

## 2024-10-30 MED ORDER — APIXABAN 5 MG PO TABS
5.0000 mg | ORAL_TABLET | Freq: Two times a day (BID) | ORAL | 0 refills | Status: AC
  Filled 2024-10-30: qty 180, 90d supply, fill #0

## 2024-10-30 NOTE — Telephone Encounter (Signed)
 Eliquis  5mg  refill request received. Patient is 79 years old, weight-63kg, Crea-0.60 on 01/07/24, Diagnosis-Afib, and last seen by Dr. Waddell on 02/15/24. Dose is appropriate based on dosing criteria. Will send in refill to requested pharmacy.

## 2024-10-31 ENCOUNTER — Other Ambulatory Visit (HOSPITAL_COMMUNITY): Payer: Self-pay

## 2024-11-01 ENCOUNTER — Other Ambulatory Visit: Payer: Self-pay

## 2024-11-03 ENCOUNTER — Other Ambulatory Visit: Payer: Self-pay

## 2024-11-14 ENCOUNTER — Other Ambulatory Visit: Payer: Self-pay

## 2025-02-05 ENCOUNTER — Ambulatory Visit: Admitting: Cardiology
